# Patient Record
Sex: Male | Born: 1945
Health system: Southern US, Community
[De-identification: ages and names within clinical notes are randomized; demographics above are authoritative.]

## PROBLEM LIST (undated history)

## (undated) DIAGNOSIS — I2510 Atherosclerotic heart disease of native coronary artery without angina pectoris: Secondary | ICD-10-CM

## (undated) DIAGNOSIS — IMO0002 Reserved for concepts with insufficient information to code with codable children: Secondary | ICD-10-CM

## (undated) DIAGNOSIS — I10 Essential (primary) hypertension: Secondary | ICD-10-CM

## (undated) DIAGNOSIS — I25810 Atherosclerosis of coronary artery bypass graft(s) without angina pectoris: Secondary | ICD-10-CM

## (undated) DIAGNOSIS — M109 Gout, unspecified: Secondary | ICD-10-CM

## (undated) DIAGNOSIS — B029 Zoster without complications: Secondary | ICD-10-CM

## (undated) DIAGNOSIS — E785 Hyperlipidemia, unspecified: Secondary | ICD-10-CM

## (undated) DIAGNOSIS — E739 Lactose intolerance, unspecified: Secondary | ICD-10-CM

## (undated) DIAGNOSIS — F528 Other sexual dysfunction not due to a substance or known physiological condition: Secondary | ICD-10-CM

## (undated) DIAGNOSIS — I6529 Occlusion and stenosis of unspecified carotid artery: Secondary | ICD-10-CM

## (undated) HISTORY — DX: Hyperlipidemia, unspecified: E78.5

## (undated) HISTORY — DX: Essential (primary) hypertension: I10

## (undated) HISTORY — DX: Reserved for concepts with insufficient information to code with codable children: IMO0002

## (undated) HISTORY — DX: Zoster without complications: B02.9

## (undated) HISTORY — DX: Other sexual dysfunction not due to a substance or known physiological condition: F52.8

## (undated) HISTORY — PX: COLONOSCOPY: SHX174

## (undated) HISTORY — DX: Gout, unspecified: M10.9

## (undated) HISTORY — DX: Atherosclerotic heart disease of native coronary artery without angina pectoris: I25.10

## (undated) HISTORY — DX: Atherosclerosis of coronary artery bypass graft(s) without angina pectoris: I25.810

## (undated) HISTORY — DX: Occlusion and stenosis of unspecified carotid artery: I65.29

## (undated) HISTORY — DX: Lactose intolerance, unspecified: E73.9

---

## 1972-11-06 HISTORY — PX: PARTIAL GASTRECTOMY: SHX2172

## 1999-02-01 ENCOUNTER — Ambulatory Visit (HOSPITAL_COMMUNITY): Admission: RE | Admit: 1999-02-01 | Discharge: 1999-02-01 | Payer: Self-pay | Admitting: Internal Medicine

## 1999-02-01 ENCOUNTER — Encounter: Payer: Self-pay | Admitting: Internal Medicine

## 2007-11-07 HISTORY — PX: CORONARY ARTERY BYPASS GRAFT: SHX141

## 2008-01-16 ENCOUNTER — Ambulatory Visit: Payer: Self-pay | Admitting: Internal Medicine

## 2008-01-16 DIAGNOSIS — M109 Gout, unspecified: Secondary | ICD-10-CM

## 2008-01-16 DIAGNOSIS — I10 Essential (primary) hypertension: Secondary | ICD-10-CM | POA: Insufficient documentation

## 2008-01-16 DIAGNOSIS — E785 Hyperlipidemia, unspecified: Secondary | ICD-10-CM

## 2008-01-16 DIAGNOSIS — R079 Chest pain, unspecified: Secondary | ICD-10-CM | POA: Insufficient documentation

## 2008-01-16 HISTORY — DX: Hyperlipidemia, unspecified: E78.5

## 2008-01-16 HISTORY — DX: Essential (primary) hypertension: I10

## 2008-01-16 HISTORY — DX: Gout, unspecified: M10.9

## 2008-01-17 ENCOUNTER — Telehealth: Payer: Self-pay | Admitting: Internal Medicine

## 2008-01-17 LAB — CONVERTED CEMR LAB
ALT: 18 units/L (ref 0–53)
Albumin: 3.2 g/dL — ABNORMAL LOW (ref 3.5–5.2)
Alkaline Phosphatase: 65 units/L (ref 39–117)
BUN: 12 mg/dL (ref 6–23)
Basophils Absolute: 0.1 10*3/uL (ref 0.0–0.1)
Basophils Relative: 0.9 % (ref 0.0–1.0)
CO2: 28 meq/L (ref 19–32)
Calcium: 9.8 mg/dL (ref 8.4–10.5)
Direct LDL: 183.5 mg/dL
GFR calc Af Amer: 111 mL/min
Hemoglobin: 12.2 g/dL — ABNORMAL LOW (ref 13.0–17.0)
Lymphocytes Relative: 27.9 % (ref 12.0–46.0)
MCHC: 33.5 g/dL (ref 30.0–36.0)
Monocytes Absolute: 0.7 10*3/uL (ref 0.2–0.7)
Monocytes Relative: 8.3 % (ref 3.0–11.0)
Neutro Abs: 5 10*3/uL (ref 1.4–7.7)
PSA: 0.38 ng/mL (ref 0.10–4.00)
Platelets: 323 10*3/uL (ref 150–400)
Potassium: 5 meq/L (ref 3.5–5.1)
RDW: 13.3 % (ref 11.5–14.6)
TSH: 1.06 microintl units/mL (ref 0.35–5.50)
Uric Acid, Serum: 8.1 mg/dL — ABNORMAL HIGH (ref 2.4–7.0)

## 2008-01-24 ENCOUNTER — Ambulatory Visit: Payer: Self-pay

## 2008-01-24 ENCOUNTER — Ambulatory Visit: Payer: Self-pay | Admitting: Cardiovascular Disease

## 2008-01-25 ENCOUNTER — Ambulatory Visit (HOSPITAL_COMMUNITY): Admission: RE | Admit: 2008-01-25 | Discharge: 2008-01-25 | Payer: Self-pay | Admitting: Cardiovascular Disease

## 2008-01-28 ENCOUNTER — Encounter: Payer: Self-pay | Admitting: Cardiovascular Disease

## 2008-01-28 ENCOUNTER — Ambulatory Visit (HOSPITAL_COMMUNITY): Admission: RE | Admit: 2008-01-28 | Discharge: 2008-01-28 | Payer: Self-pay | Admitting: Cardiovascular Disease

## 2008-01-28 ENCOUNTER — Ambulatory Visit: Payer: Self-pay | Admitting: Cardiovascular Disease

## 2008-01-28 ENCOUNTER — Inpatient Hospital Stay (HOSPITAL_BASED_OUTPATIENT_CLINIC_OR_DEPARTMENT_OTHER): Admission: RE | Admit: 2008-01-28 | Discharge: 2008-01-28 | Payer: Self-pay | Admitting: Cardiovascular Disease

## 2008-01-28 ENCOUNTER — Encounter: Payer: Self-pay | Admitting: Internal Medicine

## 2008-01-28 ENCOUNTER — Ambulatory Visit: Payer: Self-pay | Admitting: Vascular Surgery

## 2008-01-30 ENCOUNTER — Ambulatory Visit: Payer: Self-pay | Admitting: Cardiothoracic Surgery

## 2008-02-10 ENCOUNTER — Ambulatory Visit: Payer: Self-pay | Admitting: Cardiothoracic Surgery

## 2008-02-10 ENCOUNTER — Inpatient Hospital Stay (HOSPITAL_COMMUNITY): Admission: RE | Admit: 2008-02-10 | Discharge: 2008-02-15 | Payer: Self-pay | Admitting: Cardiothoracic Surgery

## 2008-02-10 DIAGNOSIS — I25810 Atherosclerosis of coronary artery bypass graft(s) without angina pectoris: Secondary | ICD-10-CM

## 2008-02-10 HISTORY — DX: Atherosclerosis of coronary artery bypass graft(s) without angina pectoris: I25.810

## 2008-02-13 ENCOUNTER — Ambulatory Visit: Payer: Self-pay | Admitting: Cardiothoracic Surgery

## 2008-03-12 ENCOUNTER — Ambulatory Visit: Payer: Self-pay | Admitting: Cardiothoracic Surgery

## 2008-03-12 ENCOUNTER — Encounter (HOSPITAL_COMMUNITY): Admission: RE | Admit: 2008-03-12 | Discharge: 2008-06-10 | Payer: Self-pay | Admitting: Cardiovascular Disease

## 2008-03-12 ENCOUNTER — Encounter: Admission: RE | Admit: 2008-03-12 | Discharge: 2008-03-12 | Payer: Self-pay | Admitting: Cardiothoracic Surgery

## 2008-03-17 ENCOUNTER — Ambulatory Visit: Payer: Self-pay | Admitting: Cardiovascular Disease

## 2008-04-03 ENCOUNTER — Ambulatory Visit: Payer: Self-pay | Admitting: Internal Medicine

## 2008-04-03 DIAGNOSIS — I2510 Atherosclerotic heart disease of native coronary artery without angina pectoris: Secondary | ICD-10-CM | POA: Insufficient documentation

## 2008-04-03 HISTORY — DX: Atherosclerotic heart disease of native coronary artery without angina pectoris: I25.10

## 2008-04-10 ENCOUNTER — Telehealth: Payer: Self-pay | Admitting: Internal Medicine

## 2008-05-21 ENCOUNTER — Ambulatory Visit: Payer: Self-pay | Admitting: Cardiovascular Disease

## 2008-05-21 LAB — CONVERTED CEMR LAB
ALT: 26 units/L (ref 0–53)
AST: 23 units/L (ref 0–37)
Albumin: 3.2 g/dL — ABNORMAL LOW (ref 3.5–5.2)
Alkaline Phosphatase: 72 units/L (ref 39–117)
BUN: 31 mg/dL — ABNORMAL HIGH (ref 6–23)
CO2: 21 meq/L (ref 19–32)
Chloride: 110 meq/L (ref 96–112)
Cholesterol: 140 mg/dL (ref 0–200)
Creatinine, Ser: 1.5 mg/dL (ref 0.4–1.5)
GFR calc non Af Amer: 51 mL/min
Glucose, Bld: 116 mg/dL — ABNORMAL HIGH (ref 70–99)
Potassium: 5.5 meq/L — ABNORMAL HIGH (ref 3.5–5.1)
Total Protein: 7.1 g/dL (ref 6.0–8.3)
Triglycerides: 90 mg/dL (ref 0–149)

## 2008-05-27 ENCOUNTER — Ambulatory Visit: Payer: Self-pay | Admitting: Cardiovascular Disease

## 2008-05-27 LAB — CONVERTED CEMR LAB
CO2: 21 meq/L (ref 19–32)
Calcium: 9.4 mg/dL (ref 8.4–10.5)
Creatinine, Ser: 1.4 mg/dL (ref 0.4–1.5)
GFR calc Af Amer: 67 mL/min

## 2008-06-11 ENCOUNTER — Encounter (HOSPITAL_COMMUNITY): Admission: RE | Admit: 2008-06-11 | Discharge: 2008-07-30 | Payer: Self-pay | Admitting: Cardiovascular Disease

## 2008-06-18 ENCOUNTER — Ambulatory Visit: Payer: Self-pay | Admitting: Cardiovascular Disease

## 2008-06-18 LAB — CONVERTED CEMR LAB
BUN: 29 mg/dL — ABNORMAL HIGH (ref 6–23)
Calcium: 9.4 mg/dL (ref 8.4–10.5)
GFR calc Af Amer: 88 mL/min
GFR calc non Af Amer: 73 mL/min
Glucose, Bld: 127 mg/dL — ABNORMAL HIGH (ref 70–99)
Sodium: 140 meq/L (ref 135–145)

## 2008-07-20 ENCOUNTER — Ambulatory Visit: Payer: Self-pay | Admitting: Cardiovascular Disease

## 2008-07-20 LAB — CONVERTED CEMR LAB
BUN: 28 mg/dL — ABNORMAL HIGH (ref 6–23)
Creatinine, Ser: 1.3 mg/dL (ref 0.4–1.5)
GFR calc Af Amer: 73 mL/min
GFR calc non Af Amer: 60 mL/min
Potassium: 4.9 meq/L (ref 3.5–5.1)

## 2008-09-15 ENCOUNTER — Ambulatory Visit: Payer: Self-pay

## 2008-09-15 ENCOUNTER — Ambulatory Visit: Payer: Self-pay | Admitting: Cardiovascular Disease

## 2008-09-22 DIAGNOSIS — I6529 Occlusion and stenosis of unspecified carotid artery: Secondary | ICD-10-CM | POA: Insufficient documentation

## 2008-09-22 HISTORY — DX: Occlusion and stenosis of unspecified carotid artery: I65.29

## 2009-01-14 ENCOUNTER — Ambulatory Visit: Payer: Self-pay | Admitting: Internal Medicine

## 2009-01-14 DIAGNOSIS — F528 Other sexual dysfunction not due to a substance or known physiological condition: Secondary | ICD-10-CM | POA: Insufficient documentation

## 2009-01-14 HISTORY — DX: Other sexual dysfunction not due to a substance or known physiological condition: F52.8

## 2009-01-14 LAB — CONVERTED CEMR LAB
LDL Cholesterol: 119 mg/dL — ABNORMAL HIGH (ref 0–99)
PSA: 0.24 ng/mL (ref 0.10–4.00)
Uric Acid, Serum: 8 mg/dL — ABNORMAL HIGH (ref 4.0–7.8)
VLDL: 19 mg/dL (ref 0–40)

## 2009-01-22 ENCOUNTER — Telehealth: Payer: Self-pay | Admitting: Internal Medicine

## 2009-02-18 IMAGING — CR DG CHEST 1V PORT
1 series · 1 of 1 positions shown · non-contrast
Comparison: 02/10/2008.

CLINICAL DATA: Coronary artery disease.  Previous median
sternotomy.  Removal of endotracheal tube.

PORTABLE CHEST - 1 VIEW

[view not recorded]
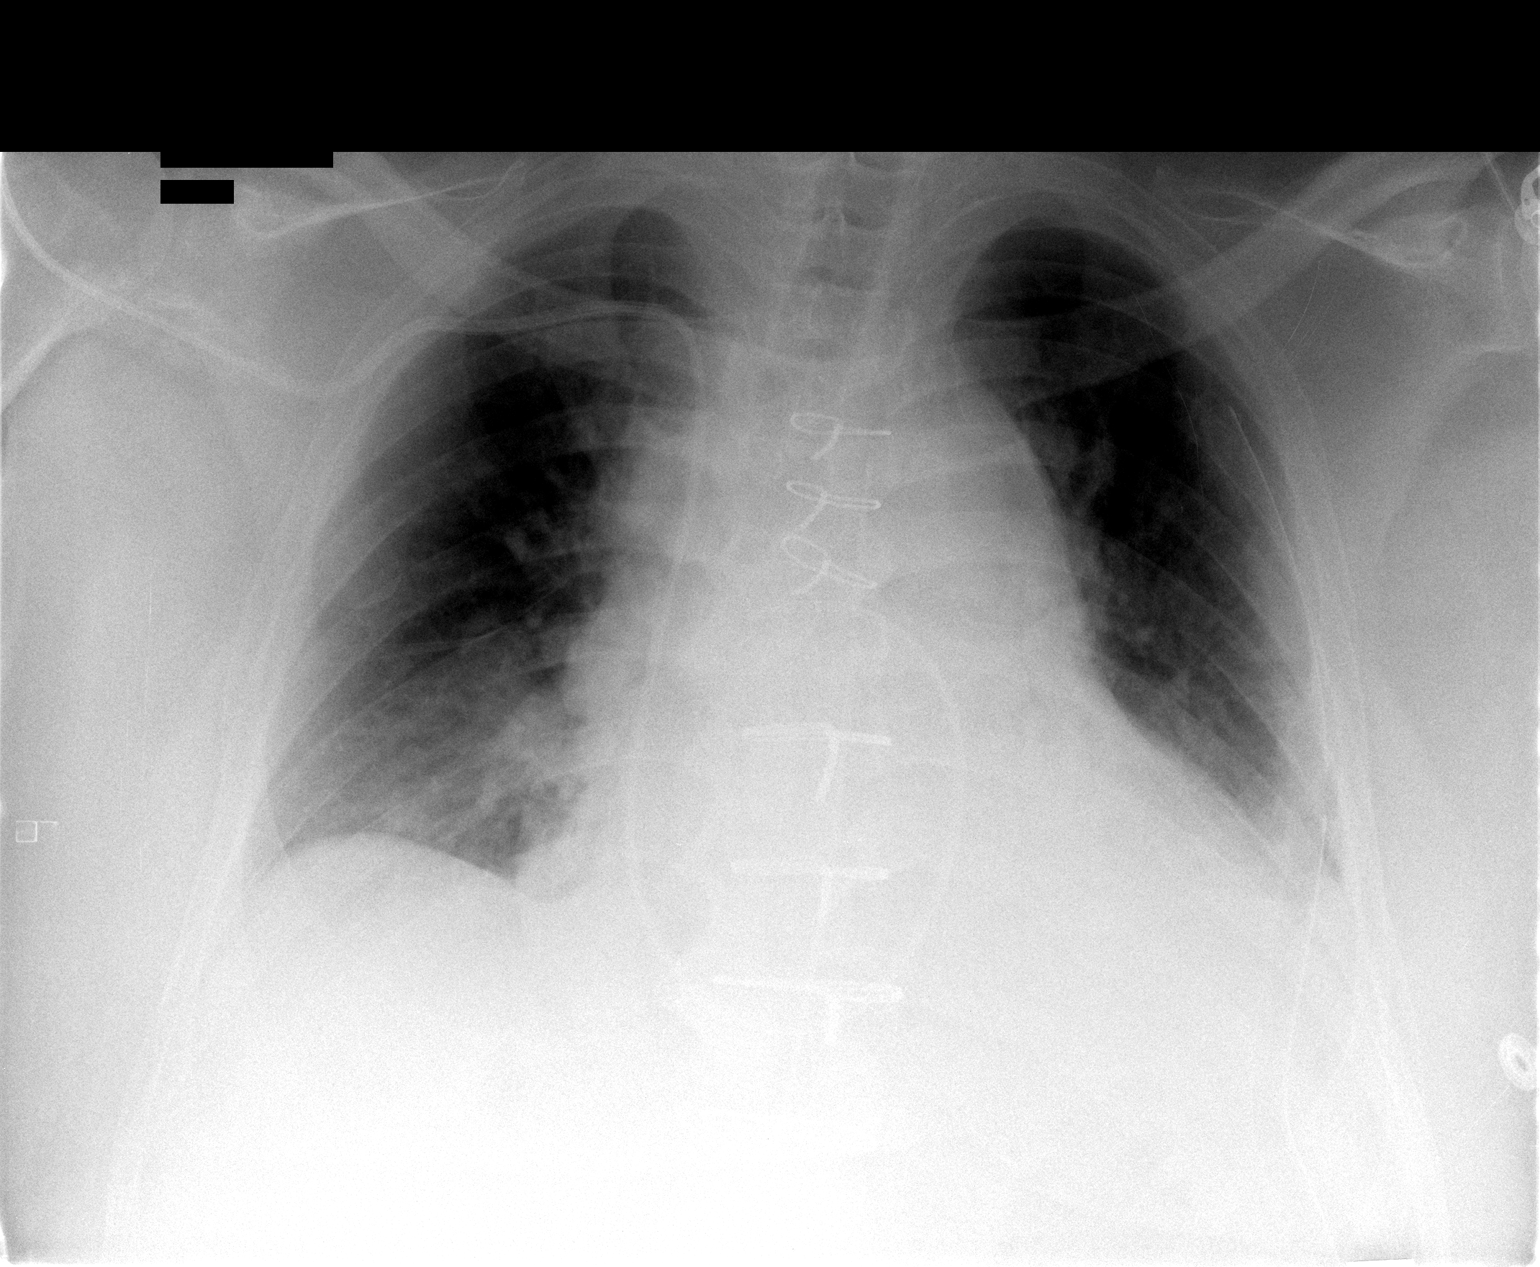

[1 of 1 positions shown; findings below may reference images not displayed]

FINDINGS: The endotracheal tube has been removed.  There has been a
previous median sternotomy.  There is stable cardiomegaly.  There
are mild stable bibasilar atelectatic changes.  The left sided
chest tube is in satisfactory position.  There is no pneumothorax.
IMPRESSION: Stable cardiomegaly and stable bibasilar atelectatic changes.  No
pneumothorax.

## 2009-02-20 IMAGING — CR DG CHEST 1V PORT
1 series · 1 of 1 positions shown · non-contrast
Comparison: 02/12/2008

CLINICAL DATA: Post CABG for coronary artery disease

PORTABLE CHEST - 1 VIEW

[AP]
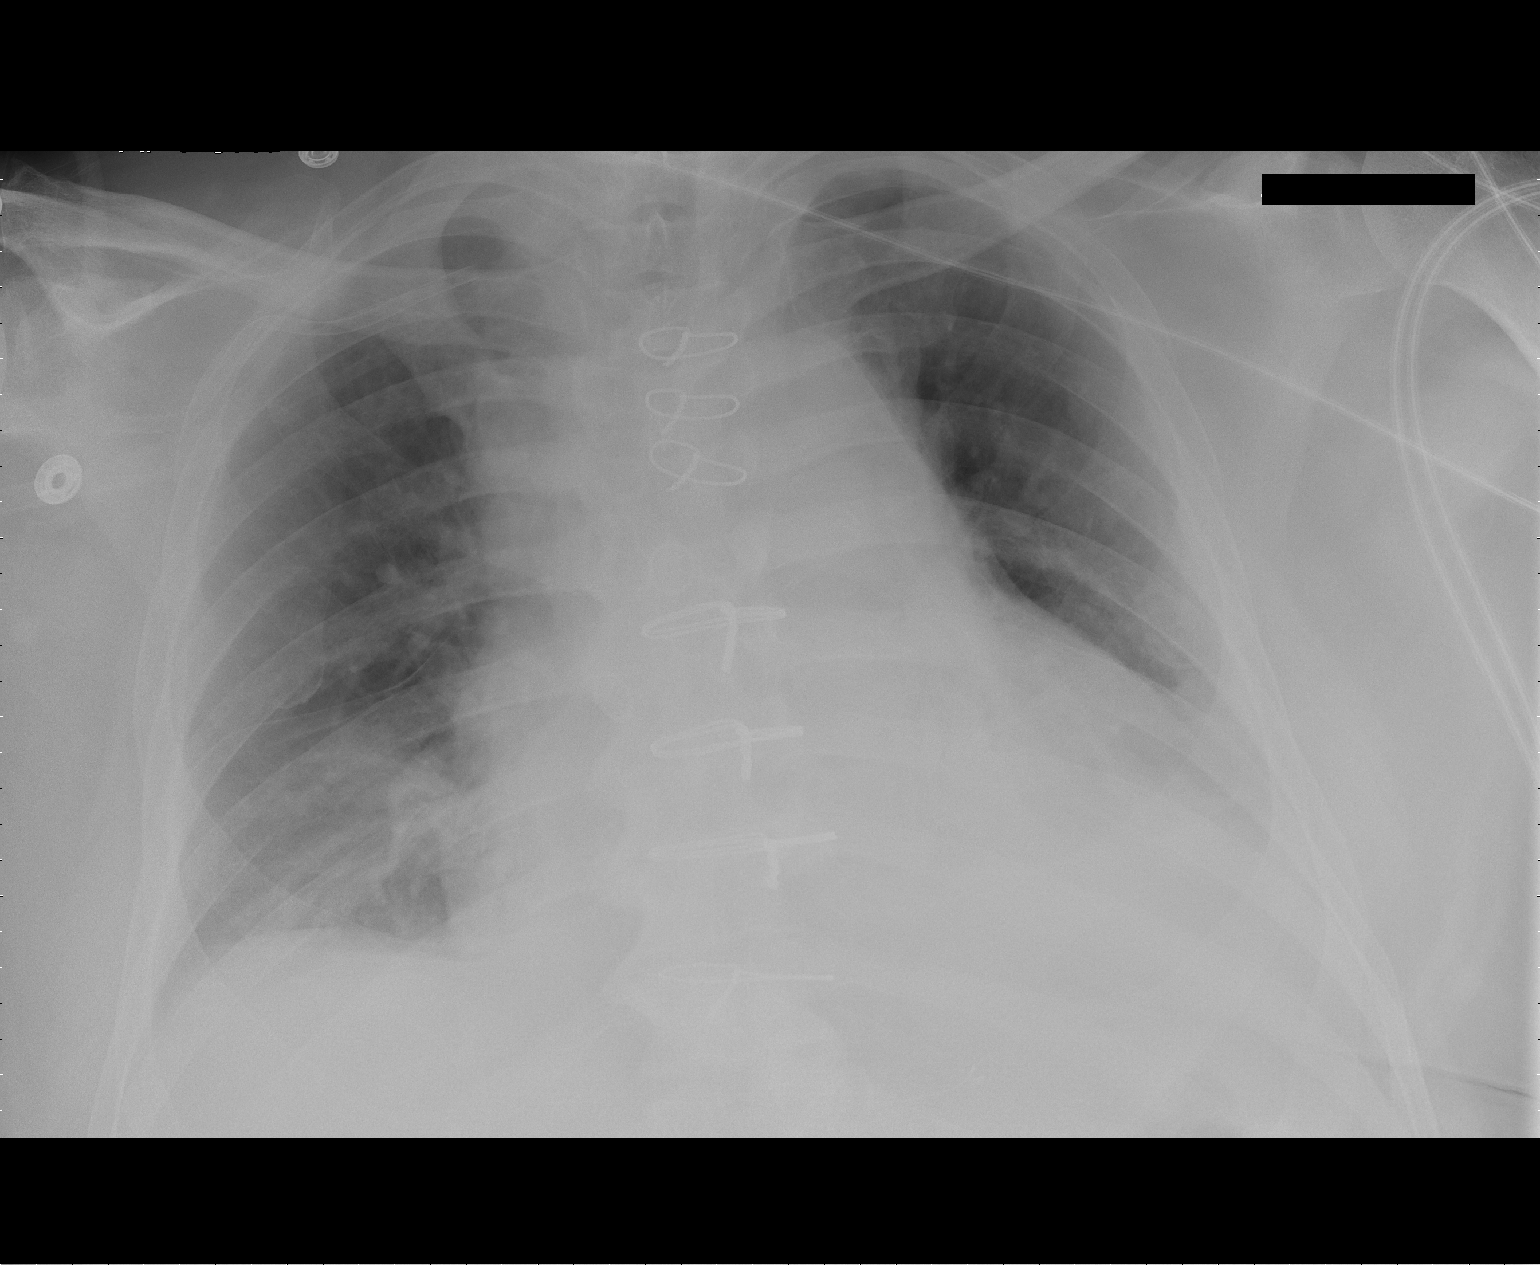

[1 of 1 positions shown; findings below may reference images not displayed]

FINDINGS: Heart remains enlarged without frank congestive heart
failure.  Bibasilar atelectasis about the same. No visible
pneumothorax.  A vascular sheath is positioned in the right
subclavian vein.  It is kinked at its entry point into the thorax.
IMPRESSION: 1.Postoperative atelectasis - no significant change.
2.  Right subclavian vascular sheath kinked near its entry point
into the  thorax

## 2009-04-15 ENCOUNTER — Ambulatory Visit: Payer: Self-pay | Admitting: Internal Medicine

## 2009-05-05 ENCOUNTER — Ambulatory Visit: Payer: Self-pay | Admitting: Cardiovascular Disease

## 2009-08-09 ENCOUNTER — Encounter: Payer: Self-pay | Admitting: Cardiovascular Disease

## 2009-11-06 DIAGNOSIS — B029 Zoster without complications: Secondary | ICD-10-CM

## 2009-11-06 HISTORY — DX: Zoster without complications: B02.9

## 2010-02-15 ENCOUNTER — Telehealth (INDEPENDENT_AMBULATORY_CARE_PROVIDER_SITE_OTHER): Payer: Self-pay | Admitting: *Deleted

## 2010-02-18 ENCOUNTER — Encounter (INDEPENDENT_AMBULATORY_CARE_PROVIDER_SITE_OTHER): Payer: Self-pay | Admitting: *Deleted

## 2010-02-18 ENCOUNTER — Telehealth (INDEPENDENT_AMBULATORY_CARE_PROVIDER_SITE_OTHER): Payer: Self-pay | Admitting: *Deleted

## 2010-04-15 ENCOUNTER — Ambulatory Visit: Payer: Self-pay | Admitting: Internal Medicine

## 2010-04-15 DIAGNOSIS — B029 Zoster without complications: Secondary | ICD-10-CM

## 2010-04-15 HISTORY — DX: Zoster without complications: B02.9

## 2010-07-04 ENCOUNTER — Telehealth: Payer: Self-pay | Admitting: Cardiovascular Disease

## 2010-08-05 ENCOUNTER — Ambulatory Visit: Payer: Self-pay | Admitting: Internal Medicine

## 2010-08-05 LAB — CONVERTED CEMR LAB
ALT: 18 units/L (ref 0–53)
AST: 21 units/L (ref 0–37)
Albumin: 3.3 g/dL — ABNORMAL LOW (ref 3.5–5.2)
Alkaline Phosphatase: 66 units/L (ref 39–117)
Basophils Relative: 0.8 % (ref 0.0–3.0)
CO2: 26 meq/L (ref 19–32)
Calcium: 9.2 mg/dL (ref 8.4–10.5)
Chloride: 107 meq/L (ref 96–112)
Eosinophils Absolute: 0.1 10*3/uL (ref 0.0–0.7)
Hemoglobin: 11.4 g/dL — ABNORMAL LOW (ref 13.0–17.0)
Lymphocytes Relative: 22.9 % (ref 12.0–46.0)
MCHC: 33.6 g/dL (ref 30.0–36.0)
MCV: 84.4 fL (ref 78.0–100.0)
Neutro Abs: 5.2 10*3/uL (ref 1.4–7.7)
Nitrite: NEGATIVE
RBC: 4.03 M/uL — ABNORMAL LOW (ref 4.22–5.81)
Sodium: 140 meq/L (ref 135–145)
Specific Gravity, Urine: 1.02
Total CHOL/HDL Ratio: 3
Total Protein: 7.4 g/dL (ref 6.0–8.3)
WBC Urine, dipstick: NEGATIVE

## 2010-08-22 ENCOUNTER — Ambulatory Visit: Payer: Self-pay | Admitting: Internal Medicine

## 2010-08-22 DIAGNOSIS — E739 Lactose intolerance, unspecified: Secondary | ICD-10-CM

## 2010-08-22 HISTORY — DX: Lactose intolerance, unspecified: E73.9

## 2010-08-22 LAB — CONVERTED CEMR LAB: Hgb A1c MFr Bld: 7.1 % — ABNORMAL HIGH (ref 4.6–6.5)

## 2010-10-06 ENCOUNTER — Ambulatory Visit: Payer: Self-pay | Admitting: Cardiovascular Disease

## 2010-11-08 ENCOUNTER — Encounter (INDEPENDENT_AMBULATORY_CARE_PROVIDER_SITE_OTHER): Payer: Self-pay | Admitting: *Deleted

## 2010-11-10 ENCOUNTER — Encounter: Payer: Self-pay | Admitting: Cardiovascular Disease

## 2010-11-17 ENCOUNTER — Encounter: Payer: Self-pay | Admitting: Cardiovascular Disease

## 2010-12-06 NOTE — Assessment & Plan Note (Signed)
Summary: cpx//ccm rsc bmp/njr/pt resc/ccm   Vital Signs:  Patient profile:   65 year old male Height:      69.5 inches Weight:      266 pounds BMI:     38.86 Temp:     98.9 degrees F oral BP sitting:   120 / 80  (right arm) Cuff size:   regular  Vitals Entered By: Duard Brady LPN (August 22, 2010 9:28 AM) CC: cpx - doing well Is Patient Diabetic? No   Primary Care Provider:  Gordy Savers  MD  CC:  cpx - doing well.  History of Present Illness: 65 year old patient who is seen today for a wellness exam.  Medical problems include coronary artery disease.  He is status post CABG.  He has hypertension, dyslipidemia, and a history of impaired glucose tolerance.  He has a history of exogenous obesity.  There is a history of gout, which has been stable.  Laboratory studies have revealed mild anemia.  No prior colonoscopies.  Preventive Screening-Counseling & Management  Caffeine-Diet-Exercise     Does Patient Exercise: no  Allergies (verified): No Known Drug Allergies  Past History:  Past Medical History: Hyperlipidemia Hypertension impaired glucose tolerance polyarticular gout exertional chest pain Coronary artery disease herpes zoster, June 2011 mild anemia  Past Surgical History: status post partial gastrectomy for peptic ulcer disease in the 70s Coronary artery bypass graft April 2009, with LIMA-LAD, SVG-RCA, and sequenced SVG - Ramus and OM1. colonoscopy-none   Family History: Reviewed history from 01/16/2008 and no changes required. father  has died recently at age 58 mother died at age 55, cardiac disease one brother in good health 5 sisters, history of gout; one sister with coronary artery disease  Social History: Reviewed history from 01/16/2008 and no changes required. Married discontinue smoking 6 months ago Regular exercise-no; walks infrequently  Does Patient Exercise:  no  Review of Systems  The patient denies anorexia, fever,  weight loss, weight gain, vision loss, decreased hearing, hoarseness, chest pain, syncope, dyspnea on exertion, peripheral edema, prolonged cough, headaches, hemoptysis, abdominal pain, melena, hematochezia, severe indigestion/heartburn, hematuria, incontinence, genital sores, muscle weakness, suspicious skin lesions, transient blindness, difficulty walking, depression, unusual weight change, abnormal bleeding, enlarged lymph nodes, angioedema, breast masses, and testicular masses.    Physical Exam  General:  overweight-appearing.  110/70overweight-appearing.   Head:  Normocephalic and atraumatic without obvious abnormalities. No apparent alopecia or balding. Eyes:  No corneal or conjunctival inflammation noted. EOMI. Perrla. Funduscopic exam benign, without hemorrhages, exudates or papilledema. Vision grossly normal. Ears:  External ear exam shows no significant lesions or deformities.  Otoscopic examination reveals clear canals, tympanic membranes are intact bilaterally without bulging, retraction, inflammation or discharge. Hearing is grossly normal bilaterally. Nose:  External nasal examination shows no deformity or inflammation. Nasal mucosa are pink and moist without lesions or exudates. Mouth:  Oral mucosa and oropharynx without lesions or exudates.  dentures in place Neck:  No deformities, masses, or tenderness noted. Chest Wall:  status post sternotomy Breasts:  No masses or gynecomastia noted Lungs:  Normal respiratory effort, chest expands symmetrically. Lungs are clear to auscultation, no crackles or wheezes. Heart:  Normal rate and regular rhythm. S1 and S2 normal without gallop, murmur, click, rub or other extra sounds. Abdomen:  Bowel sounds positive,abdomen soft and non-tender without masses, organomegaly or hernias noted. obese epigastric scar Rectal:  No external abnormalities noted. Normal sphincter tone. No rectal masses or tenderness.  hematest negative Genitalia:  Testes  bilaterally  descended without nodularity, tenderness or masses. No scrotal masses or lesions. No penis lesions or urethral discharge. Prostate:  suboptimal exam Msk:  No deformity or scoliosis noted of thoracic or lumbar spine.   Pulses:  R and L carotid,radial,femoral,dorsalis pedis and posterior tibial pulses are full and equal bilaterally Extremities:  No clubbing, cyanosis, edema, or deformity noted with normal full range of motion of all joints.   Neurologic:  No cranial nerve deficits noted. Station and gait are normal. Plantar reflexes are down-going bilaterally. DTRs are symmetrical throughout. Sensory, motor and coordinative functions appear intact. Skin:  Intact without suspicious lesions or rashes Cervical Nodes:  No lymphadenopathy noted Axillary Nodes:  No palpable lymphadenopathy Inguinal Nodes:  No significant adenopathy Psych:  Cognition and judgment appear intact. Alert and cooperative with normal attention span and concentration. No apparent delusions, illusions, hallucinations   Impression & Recommendations:  Problem # 1:  Preventive Health Care (ICD-V70.0)  Orders: Gastroenterology Referral (GI)  Problem # 2:  IMPAIRED GLUCOSE TOLERANCE (ICD-271.3)  by weight loss, exercise, all encouraged.  A hemoglobin A1c will be reviewed  Orders: Venipuncture (96295) TLB-A1C / Hgb A1C (Glycohemoglobin) (83036-A1C) Specimen Handling (28413)  Complete Medication List: 1)  Allopurinol 100 Mg Tabs (Allopurinol) .Marland Kitchen.. 1 once daily 2)  Bayer Aspirin 325 Mg Tabs (Aspirin) .Marland Kitchen.. 1 once daily 3)  Crestor 20 Mg Tabs (Rosuvastatin calcium) .... Take one tablet by mouth daily. 4)  Metoprolol Tartrate 50 Mg Tabs (Metoprolol tartrate) .Marland Kitchen.. 1 two times a day 5)  Nitroglycerin 0.4 Mg Subl (Nitroglycerin) .... One tablet under tongue every 5 minutes as needed for chest pain---may repeat times three 6)  Lisinopril 20 Mg Tabs (Lisinopril) .... Take one tablet by mouth daily 7)  Amlodipine  Besylate 10 Mg Tabs (Amlodipine besylate) .... Take one tablet by mouth daily 8)  Tramadol Hcl 50 Mg Tabs (Tramadol hcl) .... One every 6 hours as needed for pain 9)  Acyclovir 800 Mg Tabs (Acyclovir) .... One tablet 5 times per day  Other Orders: Flu Vaccine 10yrs + (24401) Admin 1st Vaccine (02725)  Patient Instructions: 1)  Please schedule a follow-up appointment in 3 months. 2)  Limit your Sodium (Salt). 3)  It is important that you exercise regularly at least 20 minutes 5 times a week. If you develop chest pain, have severe difficulty breathing, or feel very tired , stop exercising immediately and seek medical attention. 4)  You need to lose weight. Consider a lower calorie diet and regular exercise.  Prescriptions: TRAMADOL HCL 50 MG TABS (TRAMADOL HCL) one every 6 hours as needed for pain  #120 x 2   Entered and Authorized by:   Gordy Savers  MD   Signed by:   Gordy Savers  MD on 08/22/2010   Method used:   [facility] to        [facility]  [id number]       [address]       [place], [place]  [address]       Ph: [phone] or [phone]       Fax: [phone]   RxID:   [id number] AMLODIPINE BESYLATE 10 MG TABS (AMLODIPINE BESYLATE) Take one tablet by mouth daily  #90 Tablet x 1   Entered and Authorized by:   Gordy Savers  MD   Signed by:   Gordy Savers  MD on 08/22/2010   Method used:   [id number]       [address]       [place], [place]  [address]       Ph: [phone] or [phone]       Fax: [phone]   RxID:   [id number] LISINOPRIL 20 MG TABS (LISINOPRIL) Take one tablet by mouth daily  #90 x 6   Entered and Authorized by:   Gordy Savers  MD   Signed by:   Gordy Savers  MD on 08/22/2010   Method used:   [facility]        [id number]       [address]       [place], [place]  [address]       Ph: [phone] or [phone]       Fax: [phone]   RxID:   [id number] METOPROLOL TARTRATE 50 MG  TABS (METOPROLOL TARTRATE) 1 two times a day  #180 x 6   Entered and Authorized by:   Gordy Savers  MD   Signed by:   Gordy Savers  MD on 08/22/2010   Method used:   [facility]        [facility] [id number]       [address]       [place], [place]  [address]       Ph: [phone] or [phone]       Fax: [phone]   RxID:   [id number] CRESTOR 20 MG TABS (ROSUVASTATIN CALCIUM) Take one tablet by mouth daily.  #90 x 6   Entered and Authorized by:   Gordy Savers  MD   Signed by:   Gordy Savers  MD on 08/22/2010   Method used:   [facility]        [id number]       [address]       [place], [place]  [address]       Ph: [phone] or [phone]       Fax: [phone]   RxID:   [id number] ALLOPURINOL 100 MG  TABS (ALLOPURINOL) 1 once daily  #90 Tablet x 3   Entered and Authorized by:   Gordy Savers  MD   Signed by:   Gordy Savers  MD on 08/22/2010   Method used:   [facility]        [id number]       [address]       [place], [place]  [address]       Ph: [phone] or [phone]       Fax: [phone]   RxID:   [id number]    Orders Added: 1)  Flu Vaccine 66yrs + [94174] 2)  Admin 1st Vaccine [90471] 3)  Est. Patient 40-64 years [99396] 4)  Venipuncture [36415] 5)  TLB-A1C / Hgb A1C (Glycohemoglobin) [83036-A1C] 6)  Gastroenterology Referral [GI] 7)  Specimen Handling [99000]   Immunizations Administered:  Influenza Vaccine # 1:    Vaccine Type: Fluvax 3+    Site: left deltoid    Mfr: GlaxoSmithKline    Dose: 0.5 ml    Route: IM  Given by: Duard Brady LPN    Exp. Date: 05/06/2011    Lot #: [id number]    VIS  given: 05/31/10 version given August 22, 2010.    Physician counseled: yes  Flu Vaccine Consent Questions:    Do you have a history of severe allergic reactions to this vaccine? no    Any prior history of allergic reactions to egg and/or gelatin? no    Do you have a sensitivity to the preservative Thimersol? no    Do you have a past history of Guillan-Barre Syndrome? no    Do you currently have an acute febrile illness? no    Have you ever had a severe reaction to latex? no    Vaccine information given and explained to patient? yes   Immunizations Administered:  Influenza Vaccine # 1:    Vaccine Type: Fluvax 3+    Site: left deltoid    Mfr: GlaxoSmithKline    Dose: 0.5 ml    Route: IM    Given by: Duard Brady LPN    Exp. Date: 05/06/2011    Lot #: [id number]    VIS given: 05/31/10 version given August 22, 2010.    Physician counseled: yes  Appended Document: cpx//ccm rsc bmp/njr/pt resc/ccm    CBG Result 156   Allergies: No Known Drug Allergies   Complete Medication List: 1)  Allopurinol 100 Mg Tabs (Allopurinol) .Marland Kitchen.. 1 once daily 2)  Bayer Aspirin 325 Mg Tabs (Aspirin) .Marland Kitchen.. 1 once daily 3)  Crestor 20 Mg Tabs (Rosuvastatin calcium) .... Take one tablet by mouth daily. 4)  Metoprolol Tartrate 50 Mg Tabs (Metoprolol tartrate) .Marland Kitchen.. 1 two times a day 5)  Nitroglycerin 0.4 Mg Subl (Nitroglycerin) .... One tablet under tongue every 5 minutes as needed for chest pain---may repeat times three 6)  Lisinopril 20 Mg Tabs (Lisinopril) .... Take one tablet by mouth daily 7)  Amlodipine Besylate 10 Mg Tabs (Amlodipine besylate) .... Take one tablet by mouth daily 8)  Tramadol Hcl 50 Mg Tabs (Tramadol hcl) .... One every 6 hours as needed for pain 9)  Acyclovir 800 Mg Tabs (Acyclovir) .... One tablet 5 times per day  Other Orders: Capillary Blood Glucose/CBG (29562)   Orders Added: 1)  Capillary Blood Glucose/CBG [13086]

## 2010-12-06 NOTE — Miscellaneous (Signed)
Summary: update med  Clinical Lists Changes  Medications: Changed medication from LIPITOR 40 MG  TABS (ATORVASTATIN CALCIUM) 1 once daily to SIMVASTATIN 80 MG TABS (SIMVASTATIN) Take one tablet by mouth daily at bedtime

## 2010-12-06 NOTE — Progress Notes (Signed)
Summary: refill meds  Phone Note Refill Request Call back at Home Phone [phone] Message from:  Patient on February 15, 2010 2:34 PM  Refills Requested: Medication #1:  METOPROLOL TARTRATE 50 MG  TABS 1 two times a day simvastatin 80 mg.  [facility] [address] [phone]   Method Requested: Fax to Local Pharmacy Initial call taken by: Lorne Skeens,  February 15, 2010 2:35 PM  Follow-up for Phone Call        Rx faxed to pharmacy Follow-up by: Vikki Ports,  February 15, 2010 2:43 PM    Prescriptions: METOPROLOL TARTRATE 50 MG  TABS (METOPROLOL TARTRATE) 1 two times a day  #60 x 6   Entered by:   Vikki Ports   Authorized by:   Norva Karvonen, MD   Signed by:   Vikki Ports on 02/15/2010   Method used:   [facility] to ...       [id number]       [address]       [place], [place]  [address]       Ph: [phone] or [phone]       Fax: [phone]   RxID:   [id number]

## 2010-12-06 NOTE — Progress Notes (Signed)
Summary: refill  Phone Note Refill Request   Refills Requested: Medication #1:  simvastatin 80mg  1 tab daily   [facility] [address]   Method Requested: Telephone to Pharmacy Initial call taken by: Migdalia Dk,  February 18, 2010 1:21 PM  Follow-up for Phone Call        Rx faxed to pharmacy Follow-up by: Vikki Ports,  February 18, 2010 2:36 PM

## 2010-12-06 NOTE — Progress Notes (Signed)
Summary: light chest pain  Phone Note Call from Patient   Caller: Patient Reason for Call: Talk to Nurse Summary of Call: pt having light chest pain since last week-pls advise [phone] Initial call taken by: Glynda Jaeger,  July 04, 2010 12:17 PM  Follow-up for Phone Call        I spoke with the pt's wife and the pt is having "light" CP. The pt has had symptoms off and on for one week.  I told the pt's wife that I would like to speak with the pt about his symptoms to determine if he would require earlier follow-up before Thursday.  She will have the pt call our office.  Julieta Gutting, RN, BSN  July 04, 2010 3:36 PM   last LDL was 119 - I would change to crestor rather than reduce simvastatin dose....crestor 20 mg daily with f/u lipids and lft's 3 months--per Dr Excell Seltzer  Additional Follow-up for Phone Call Additional follow up Details #1::        Pt returning call [phone] Judie Grieve  July 05, 2010 11:32 AM Additional Follow-up by: Judie Grieve,  July 05, 2010 11:32 AM    Additional Follow-up for Phone Call Additional follow up Details #2::    I called and the pt is currently not at home.  The pt's wife will call him at work and have him call the office. Julieta Gutting, RN, BSN  July 05, 2010 3:37 PM  I spoke with the pt and he c/o "light" CP last week.  The pt said he has been very upset and emotional since his friend passed away and feels this could have caused his symptoms.  The pt said he could barely tell that he was having pain and noticed it when he was crying.  The pt states he feels fine now and he has been pain free for the last 4-5 days.  I asked the pt to continue to monitor his symptoms and if he had any recurrence to call our office ASAP.  Pt agreed with plan.   I also spoke with the pt's wife and made her aware of my conversation with the pt.  I asked her to have the pt stop simvastatin and start Crestor 20mg  daily.  The pt will have labs rechecked on  10/06/10.  Follow-up by: Julieta Gutting, RN, BSN,  July 05, 2010 4:24 PM  New/Updated Medications: CRESTOR 20 MG TABS (ROSUVASTATIN CALCIUM) Take one tablet by mouth daily. Prescriptions: CRESTOR 20 MG TABS (ROSUVASTATIN CALCIUM) Take one tablet by mouth daily.  #30 x 6   Entered by:   Julieta Gutting, RN, BSN   Authorized by:   Norva Karvonen, MD   Signed by:   Julieta Gutting, RN, BSN on 07/05/2010   Method used:   [facility]        [facility]  [id number]       [address]       [place], [place]  [address]       Ph: [phone] or [phone]       Fax: [phone]   RxID:   [id number]

## 2010-12-06 NOTE — Assessment & Plan Note (Signed)
Summary: rash on shoulders//ccm   Vital Signs:  Patient profile:   65 year old male Weight:      268 pounds Temp:     98.6 degrees F oral BP sitting:   132 / 80  (right arm) Cuff size:   large  Vitals Entered By: Duard Brady LPN (April 15, 2010 8:06 AM) CC: rash to shoulders x 5 days Is Patient Diabetic? No   Primary Care Provider:  Gordy Savers  MD  CC:  rash to shoulders x 5 days.  History of Present Illness: 65 year old patient has a history of coronary artery disease.  He presents with a chief complaint of rash to his left shoulder region.  This has been present for about 5 days.  He has hypertension and dyslipidemia, which has been stable.  He has a history of gout.  He doesn't run some occasional bilateral knee pain without inflammatory changes.  It is self-limited  Preventive Screening-Counseling & Management  Alcohol-Tobacco     Smoking Status: quit  Allergies (verified): No Known Drug Allergies  Past History:  Past Medical History: Hyperlipidemia Hypertension impaired glucose tolerance polyarticular gout exertional chest pain Coronary artery disease herpes zoster, June 2011  Review of Systems       The patient complains of suspicious skin lesions.  The patient denies anorexia, fever, weight loss, weight gain, vision loss, decreased hearing, hoarseness, chest pain, syncope, dyspnea on exertion, peripheral edema, prolonged cough, headaches, hemoptysis, abdominal pain, melena, hematochezia, severe indigestion/heartburn, hematuria, incontinence, genital sores, muscle weakness, transient blindness, difficulty walking, depression, unusual weight change, abnormal bleeding, enlarged lymph nodes, angioedema, breast masses, and testicular masses.    Physical Exam  General:  overweight-appearing.  120/76 Head:  Normocephalic and atraumatic without obvious abnormalities. No apparent alopecia or balding. Neck:  No deformities, masses, or tenderness  noted. Lungs:  Normal respiratory effort, chest expands symmetrically. Lungs are clear to auscultation, no crackles or wheezes. Heart:  Normal rate and regular rhythm. S1 and S2 normal without gallop, murmur, click, rub or other extra sounds. Skin:  herpetic rash over his left shoulder and chest wall area.  It extends to his posterior back   Impression & Recommendations:  Problem # 1:  CORONARY ARTERY DISEASE (ICD-414.00)  His updated medication list for this problem includes:    Bayer Aspirin 325 Mg Tabs (Aspirin) .Marland Kitchen... 1 once daily    Metoprolol Tartrate 50 Mg Tabs (Metoprolol tartrate) .Marland Kitchen... 1 two times a day    Nitroglycerin 0.4 Mg Subl (Nitroglycerin) ..... One tablet under tongue every 5 minutes as needed for chest pain---may repeat times three    Lisinopril 20 Mg Tabs (Lisinopril) .Marland Kitchen... Take one tablet by mouth daily    Amlodipine Besylate 10 Mg Tabs (Amlodipine besylate) .Marland Kitchen... Take one tablet by mouth daily  Problem # 2:  GOUT (ICD-274.9)  His updated medication list for this problem includes:    Allopurinol 100 Mg Tabs (Allopurinol) .Marland Kitchen... 1 once daily  Orders: Prescription Created Electronically [phone])  Problem # 3:  HYPERTENSION (ICD-401.9)  His updated medication list for this problem includes:    Metoprolol Tartrate 50 Mg Tabs (Metoprolol tartrate) .Marland Kitchen... 1 two times a day    Lisinopril 20 Mg Tabs (Lisinopril) .Marland Kitchen... Take one tablet by mouth daily    Amlodipine Besylate 10 Mg Tabs (Amlodipine besylate) .Marland Kitchen... Take one tablet by mouth daily  Orders: Prescription Created Electronically [phone])  Problem # 4:  HYPERLIPIDEMIA (ICD-272.4)  His updated medication list for this problem includes:  Simvastatin 80 Mg Tabs (Simvastatin) .Marland Kitchen... Take one tablet by mouth daily at bedtime  Orders: Prescription Created Electronically [phone])  Complete Medication List: 1)  Allopurinol 100 Mg Tabs (Allopurinol) .Marland Kitchen.. 1 once daily 2)  Bayer Aspirin 325 Mg Tabs (Aspirin) .Marland Kitchen.. 1 once  daily 3)  Simvastatin 80 Mg Tabs (Simvastatin) .... Take one tablet by mouth daily at bedtime 4)  Metoprolol Tartrate 50 Mg Tabs (Metoprolol tartrate) .Marland Kitchen.. 1 two times a day 5)  Nitroglycerin 0.4 Mg Subl (Nitroglycerin) .... One tablet under tongue every 5 minutes as needed for chest pain---may repeat times three 6)  Lisinopril 20 Mg Tabs (Lisinopril) .... Take one tablet by mouth daily 7)  Amlodipine Besylate 10 Mg Tabs (Amlodipine besylate) .... Take one tablet by mouth daily 8)  Tramadol Hcl 50 Mg Tabs (Tramadol hcl) .... One every 6 hours as needed for pain 9)  Acyclovir 800 Mg Tabs (Acyclovir) .... One tablet 5 times per day  Patient Instructions: 1)  Please schedule a follow-up appointment in 4 months. 2)  Limit your Sodium (Salt) to less than 2 grams a day(slightly less than 1/2 a teaspoon) to prevent fluid retention, swelling, or worsening of symptoms. 3)  It is important that you exercise regularly at least 20 minutes 5 times a week. If you develop chest pain, have severe difficulty breathing, or feel very tired , stop exercising immediately and seek medical attention. 4)  You need to lose weight. Consider a lower calorie diet and regular exercise.  Prescriptions: ACYCLOVIR 800 MG TABS (ACYCLOVIR) one tablet 5 times per day  #35 x 0   Entered and Authorized by:   Gordy Savers  MD   Signed by:   Gordy Savers  MD on 04/15/2010   Method used:   Print then Give to Patient   RxID:   [id number] TRAMADOL HCL 50 MG TABS (TRAMADOL HCL) one every 6 hours as needed for pain  #120 x 2   Entered and Authorized by:   Gordy Savers  MD   Signed by:   Gordy Savers  MD on 04/15/2010   Method used:   Electronically to        [id number]       [address]       [place], [place]  [address]       Ph: [phone] or [phone]       Fax: [phone]   RxID:   [id number] AMLODIPINE BESYLATE 10 MG TABS (AMLODIPINE  BESYLATE) Take one tablet by mouth daily  #90 Tablet x 1   Entered and Authorized by:   Gordy Savers  MD   Signed by:   Gordy Savers  MD on 04/15/2010   Method used:   Electronically to        [id number]       [address]       [place], [place]  [address]       Ph: [phone] or [phone]       Fax: [phone]   RxID:   [id number] LISINOPRIL 20 MG TABS (LISINOPRIL) Take one tablet by mouth daily  #90 x 6   Entered and Authorized by:   Gordy Savers  MD   Signed by:   Gordy Savers  MD on 04/15/2010   Method used:   [facility]ronically to  [facility] [id number]       [address]       [place], [place]  [address]       Ph: [phone] or [phone]       Fax: [phone]   RxID:   [id number] METOPROLOL TARTRATE 50 MG  TABS (METOPROLOL TARTRATE) 1 two times a day  #180 x 6   Entered and Authorized by:   Gordy Savers  MD   Signed by:   Gordy Savers  MD on 04/15/2010   Method used:   Electronically to        [id number]       [address]       [place], [place]  [address]       Ph: [phone] or [phone]       Fax: [phone]   RxID:   [id number] SIMVASTATIN 80 MG TABS (SIMVASTATIN) Take one tablet by mouth daily at bedtime  #90 x 3   Entered and Authorized by:   Gordy Savers  MD   Signed by:   Gordy Savers  MD on 04/15/2010   Method used:   Electronically to        [facility] [id number]       [address]       [place], [place]  [address]       Ph: [phone] or [phone]       Fax: [phone]   RxID:   [id number] ALLOPURINOL 100 MG  TABS (ALLOPURINOL) 1 once daily  #90 Tablet x 3   Entered and Authorized by:   Gordy Savers  MD   Signed by:   Gordy Savers  MD on 04/15/2010   Method used:   [facility]ronically to        [facility] [id number]       [address]       [place], [place]  [address]       Ph: [phone] or [phone]       Fax: [phone]   RxID:   [id number]

## 2010-12-08 NOTE — Medication Information (Signed)
Summary: Crestor  Crestor   Imported By: Marylou Mccoy 11/28/2010 17:25:38  _____________________________________________________________________  External Attachment:    Type:   Image     Comment:   External Document

## 2010-12-08 NOTE — Letter (Signed)
Summary: Referral - not able to see patient  [facility]  [address] [place], [place] [address]   Phone: [phone]  Fax: [phone]    November 08, 2010   Gordy Savers, M.D. [address] [place], [place] [address]   Re:   [name] DOB:  05/13/1946 MRN:   [id number]    Dear Dr. Amador Cunas:  Thank you for your kind referral of the above patient.  We have attempted to schedule the recommended procedure Screening Colonoscopy but have not been able to schedule because:   X  The patient was not available by phone and/or has not returned our calls.  ___ The patient declined to schedule the procedure at this time.  We appreciate the referral and hope that we will have the opportunity to treat this patient in the future.    Sincerely,    [facility] [phone]

## 2010-12-22 NOTE — Letter (Signed)
Summary: Medical Necessity Prior Authorization Form   Medical Necessity Prior Authorization Form   Imported By: Roderic Ovens 12/15/2010 09:38:09  _____________________________________________________________________  External Attachment:    Type:   Image     Comment:   External Document

## 2011-01-28 ENCOUNTER — Other Ambulatory Visit: Payer: Self-pay | Admitting: Internal Medicine

## 2011-03-21 NOTE — Discharge Summary (Signed)
NAMEDUJUAN, [name]              ACCOUNT NO.:  [id number]   MEDICAL RECORD NO.:  [id number]           PATIENT TYPE:   LOCATION:                                 FACILITY:   PHYSICIAN:  Sheliah Plane, MD    DATE OF BIRTH:  Jul 01, 1946   DATE OF ADMISSION:  DATE OF DISCHARGE:                               DISCHARGE SUMMARY   FINAL DIAGNOSIS:  Severe three-vessel coronary artery disease.   IN-HOSPITAL DIAGNOSES:  1. Postoperative hypotension.  2. Acute blood loss anemia postoperatively.  3. Volume overload postoperatively.   SECONDARY DIAGNOSES:  1. History of gouty arthritis.  2. History of bleeding ulcer treated with gastric resection in 1970.  3. Hypertension.  4. Hyperlipidemia.   IN-HOSPITAL OPERATIONS/PROCEDURES:  Coronary artery bypass grafting x4  using a left internal mammary artery to the left anterior descending,  saphenous vein graft to the obtuse marginal, saphenous vein graft to  ramus intermedius, and sequential to obtuse marginal.  Vein harvesting  to the right leg done.   HISTORY AND PHYSICAL/HOSPITAL COURSE:  The patient is a 65 year old male  who presents with approximately 1-year history of substernal chest  discomfort and nonradiating pain with mild exertional shortness of  breath.  He has history of hypertension, hyperlipidemia, and has a  history of tobacco use.  He underwent cardiac catheterization by Dr.  Excell Seltzer and noted to have severe 3-vessel coronary artery disease.  The  patient was seen and evaluated by Dr. Tyrone Sage.  Dr. Tyrone Sage discussed  with the patient undergoing coronary bypass grafting.  He discussed the  risks and benefits with the patient.  The patient voiced understanding  and agreed to proceed.  Surgery was scheduled for February 10, 2008.  For  details of the patient's past medical history and physical exam, please  see dictated H&P.   The patient was taken to the operating room on February 10, 2008, where he  underwent coronary bypass  grafting x4 using a left internal mammary  artery to the left anterior descending, saphenous vein graft to the  obtuse marginal, saphenous vein graft sequential to ramus intermedius  and obtuse marginal.  Right endovein harvesting was done.  The patient  tolerated this procedure well and was transferred to the Intensive Care  Unit in stable condition.  Postoperatively, the patient was noted to be  hemodynamically stable.  He was extubated on the evening of surgery.  Post extubation, the patient was noted to be alert and oriented x4,  neuro intact.  The patient was placed on nasal cannula postoperatively.  Followup chest x-ray done on postop day #1 was stable.  The patient had  minimal drainage from chest tubes and these were left in postop day #1.  Drainage slowed down and decreased, and chest tubes were able to be  discontinued postop day #2 with chest x-ray remaining stable.  The  patient was using his incentive spirometer and able to be weaned off  oxygen with sats greater than 90% on room air.  Postoperatively, the  patient was hypotensive.  He required dopamine and neo drip.  The  patient remained  on these on postop day #1 and postop day #2.  On postop  day #1, hemoglobin/hematocrit had dropped to 8.2 and 24% on the evening  postop day #1.  The patient received 1 unit of packed red blood cells.  Hemoglobin and hematocrit did improve the following day to 9.3 and 27%.  The patient was able to be weaned off the neo and dopamine after  transfusion on postop day #3.  His blood pressure and heart rate  remained stable.  Hemoglobin and hematocrit continued to be monitored  and remained stable during his postoperative course.  The patient was  able to be transferred out of 2300 to 2000 on postop day #3.  The  patient was out of bed ambulating well without difficulty.  He was  tolerating diet well and no nausea or vomiting noted.  He did have mild  volume overload requiring diuretics.  The  patient was back near baseline  weight prior to discharge home.  All vital signs remained stable.  Incision was clean, dry, and intact, and healing well.  The patient  remained in normal sinus rhythm.   The patient is tentatively ready for discharge home in the next 1-2 days  pending he remained stable.   FOLLOWUP APPOINTMENTS:  Followup appointment will be arranged with Dr.  Tyrone Sage in 3 weeks.  Our office will contact the patient with this  information.  The patient will need to obtain AP and lateral chest x-ray  30 minutes prior to this appointment.  He will need to follow up with  Dr. Excell Seltzer in 2 weeks.  He will need to contact Dr. Earmon Phoenix office to  make these arrangements.   ACTIVITY:  The patient is instructed no driving until he is to do so, no  heavy lifting over 10 pounds.  He is told to ambulate 3-4 times per day,  progress as tolerated, and to continue his breathing exercises.   INCISIONAL CARE:  The patient was told to shower, washing his incisions  using soap and water.  He is to contact the office if he develops any  drainage or opening from any of his incision sites.   DIET:  The patient is to begin on diet, to be low fat, low salt.   DISCHARGE MEDICATIONS:  1. Colchicine 0.6 mg q.i.d. p.r.n.  2. Allopurinol 100 mg daily.  3. Aspirin 325 mg daily.  4. Lipitor 40 mg daily.  5. Lisinopril 5 mg daily.  6. Lopressor 25 mg b.i.d.  7. Lasix 40 mg daily x5 days.  8. Potassium chloride 20 mEq daily x5 days.  9. Oxycodone 5 mg 1-2tabs q.4-6 hours p.r.n.      Theda Belfast, [place]      Sheliah Plane, MD  Electronically Signed    KMD/MEDQ  D:  02/14/2008  T:  02/14/2008  Job:  [id number]   cc:   Sheliah Plane, MD  [name], MD

## 2011-03-21 NOTE — Assessment & Plan Note (Signed)
[facility]                            CARDIOLOGY OFFICE NOTE   NAME:Simonis, KAEO JACOME                     MRN:          [id number]  DATE:01/24/2008                            DOB:          1946-10-09    PRIMARY CARE PHYSICIAN:  Gordy Savers, MD   REASON FOR CONSULTATION:  Chest pain with abnormal Myoview.   HISTORY OF PRESENT ILLNESS:  [name] is a 65 year old gentleman  who was referred by Dr. Amador Cunas for an exercise Myoview stress  study.  He was referred because of symptoms typical for chronic stable  angina.  He was able to exercise for 4 minutes and 30 seconds, and with  that level of exercise, he had significant chest pain with ST and T-wave  segment changes.  His myocardial perfusion imaging demonstrated a  moderately severe area of ischemia in the inferolateral wall.  Because  of his symptoms and abnormal stress test, he was seen in the office  immediately after the stress test.   Mr. Hearns has experienced symptoms of substernal chest pressure over  the course of one year.  He describes no increase in symptoms over that  time period.  He regularly has chest pain with walking as well as with  physical work.  He holds down two jobs.  He works at [employer] in a  relatively sedentary job and has no problems with that.  However, he  stocks shelves in another store and with that level of activity.  He  regularly experiences chest pressure that goes away with rest.   He has no associated symptoms.  Specifically, he denies any dyspnea,  nausea, vomiting, diaphoresis, lightheadedness or syncope.  He has not  had a previous cardiac evaluation.  He denies any rest symptoms.  He has  no other complaints today.   PAST MEDICAL HISTORY:  1. Remote peptic ulcer disease with a bleeding ulcer treated with a      partial gastrectomy.  2. Dyslipidemia.  3. Hypertension.  4. Tobacco abuse.  5. Gout.   SOCIAL HISTORY:  The patient is  married.  He lives locally and works at  [employer].  He is a former smoker but quit 7 months ago.  He drinks one  six-pack of beer per day.  He does not do much regular exercise.   FAMILY HISTORY:  The patient's father died recently at age 45 of old  age.  His mother died at age 15 of cardiac disease.  He reports she was  a heavy smoker.  He has siblings without any history of coronary disease  per his report.   REVIEW OF SYSTEMS:  A complete 12 point review of systems was performed  and is negative except as detailed above.   PHYSICAL EXAMINATION:  GENERAL:  Physical exam the patient is alert and  oriented.  He is an obese male in no acute distress.  VITAL SIGNS:  Blood pressure was 168/88, heart rate 70, respiratory rate  16.  HEENT:  Normal.  NECK:  Normal carotid upstrokes without bruits.  Jugular venous pressure  is normal.  There is no thyromegaly or thyroid nodules.  LUNGS:  Clear bilaterally.  HEART:  The apex is discrete nondisplaced.  The heart regular rate and  rhythm without murmurs or gallops.  ABDOMEN:  Soft, obese, nontender no organomegaly.  No abdominal bruit.  BACK:  No CVA tenderness.  EXTREMITIES:  Femoral pulses are 2+, pedal pulses are 2+.  There is no  clubbing, cyanosis or edema.  SKIN:  Warm and dry without rash.  LYMPHATICS:  No adenopathy.  NEUROLOGIC:  Cranial nerves II-XII are intact.  Strength 5/5 and equal  in the arms and legs.   STUDIES:  EKG at baseline shows normal sinus rhythm is within normal  limits.   LABORATORY DATA:  The recent lab work from March 12 showed hemoglobin of  12.2, hematocrit 36.5, platelet count 323,000, BUN was 12, creatinine  0.9, potassium 5.0.  Liver function tests were normal.  Cholesterol was  246, LDL cholesterol was 184.  I am unable to find an HDL or  triglyceride level.   Stress Myoview study is outlined above in the HPI.   IMPRESSION:  1. Exertional angina with abnormal Myoview scan.  2. Hypertension.  3.  Hyperlipidemia.  4. Obesity.   Mr. Olander has classic symptoms of angina with significant ischemia on  his Myoview scan.  He has ischemic symptoms and EKG changes at low-level  exercise.  He clearly is a relatively high risk and warrants cardiac  catheterization.  I reviewed the risks and indications of cardiac cath  in detail.  He will be set up for early next week in the [facility] cath lab.  In the meantime, I have asked him to continue on his aspirin and beta  blocker.  I have taken the liberty of starting him on Lipitor 40 mg  daily.  He has a prescription from Dr. Amador Cunas for nitroglycerin to  be used as needed.  If he has any change in his symptoms, he was given  instructions regarding EMS.  I think it is likely that he will remain  stable as his symptoms are unchanged over quite a long time.  Hopefully, he will have treatable disease as his ischemic defect, while  large, appears to be in a single-vessel territory.     [name], MD  Electronically Signed    MDC/MedQ  DD: 01/24/2008  DT: 01/24/2008  Job #: [id number]   cc:   Gordy Savers, MD

## 2011-03-21 NOTE — Assessment & Plan Note (Signed)
HEALTHCARE                            CARDIOLOGY OFFICE NOTE   NAME:Veith, ATTHEW COUTANT                     MRN:          [id number]  DATE:09/15/2008                            DOB:          [birth year]    REASON FOR FOLLOWUP:  Coronary artery disease status post coronary  artery bypass surgery.   HISTORY OF PRESENT ILLNESS:  [name] is a 65 year old gentleman  who presented with class III exertional angina, last year and was found  to have severe multivessel CAD.  He underwent coronary artery bypass  surgery by Dr. Tyrone Sage after his cardiac catheterization showed  extensive CAD.  He has had an excellent clinical response to surgery and  now has some class I symptoms.  He denies any chest pain or dyspnea with  activity.  He really feels well.  He has not been engaged in any regular  exercise program.  He works in the [employer] at [employer] and does some  lifting and carrying at work, but otherwise is not very active.  He  specifically denies chest pain, dyspnea, orthopnea, PND, edema,  palpitations, or lightheadedness.   CURRENT MEDICATIONS:  Include  1. Lipitor 40 mg daily.  2. Allopurinol 100 mg daily.  3. Aspirin 325 mg daily.  4. Metoprolol 50 mg twice daily.   ALLERGIES:  NKDA.   PHYSICAL EXAMINATION:  GENERAL:  The patient is alert and oriented.  He  is in no acute distress.  He is an obese male.  VITAL SIGNS:  Weight is 243 pounds, blood pressure is 168/98, heart rate  52, and respiratory rate 16.  HEENT:  Normal.  NECK:  Normal carotid upstrokes with a soft left carotid bruit.  JVP is  normal.  LUNGS:  Clear bilaterally.  HEART:  Regular rate and rhythm.  No murmurs or gallops.  ABDOMEN:  Soft, obese, nontender, and no organomegaly.  No bruits.  EXTREMITIES:  No clubbing, cyanosis, or edema.  Peripheral pulse is 2+  and equal throughout.  SKIN:  Warm and dry without rash.   EKG shows sinus bradycardia with first-degree AV block  and is otherwise  within normal limits.  The heart rate 53 beats per minute.   ASSESSMENT:  1. Coronary artery disease status post coronary artery bypass graft.      The patient is doing well with no angina.  I encouraged him to      begin an exercise program.  We also discussed dietary changes with      a goal towards weight loss.  2. Hypertension with suboptimal control.  He reports good blood      pressure readings at night time, but in the morning he has elevated      blood pressures.  I have added Norvasc 5 mg to his medical regimen.      We will follow up his blood pressure response at his return visit      and he will see him see Dr. Amador Cunas in the interim.  He had      problems with hyperkalemia with even low-dose ACE inhibitor, so I  would not recommend ACE inhibitors or ARB and his antihypertensive      regimen.  3. Dyslipidemia.  He is maintained on Lipitor 40 mg.  Lipids in July      showed a cholesterol of 140, HDL 38, LDL 84, and triglycerides 90.      We will repeat lipids and LFTs in 6 months at time of his return      visit.   For followup, I would like to see him back in 6 months.     [name], MD  Electronically Signed    MDC/MedQ  DD: 09/22/2008  DT: 09/22/2008  Job #: [id number]   cc:   Gordy Savers, MD

## 2011-03-21 NOTE — Cardiovascular Report (Signed)
NAMEERIKSON, DANZY              ACCOUNT NO.:  [id number]   MEDICAL RECORD NO.:  [id number]          PATIENT TYPE:  OIB   LOCATION:  1965                         FACILITY:  [facility]   PHYSICIAN:  [name], MD  DATE OF BIRTH:  1945/12/17   DATE OF PROCEDURE:  01/28/2008  DATE OF DISCHARGE:                            CARDIAC CATHETERIZATION   PROCEDURE:  Left heart catheterization, selective coronary angiography,  left ventricular angiography.   INDICATIONS:  [name] is a 65 year old gentleman who presented for  evaluation, due to typical angina with an abnormal stress test.  Mr.  Caffie Damme describes chest pressure with moderate level activity.  His  symptoms have been stable over approximately 1 year.  He underwent a  exercise Myoview study and exercised for 4 minutes and 30 seconds with  significant ST-segment changes, as well as a myocardial perfusion defect  showing ischemia in the inferolateral wall.  He was referred for cardiac  cath to evaluate his coronary status.   Risks and indications of the procedure were reviewed with the patient.  Informed consent was obtained.  The right groin was prepped, draped,  anesthetized with 1% lidocaine using the modified Seldinger technique.  A 4-French sheath was placed in the right femoral artery.  The patient  was given IV hydralazine, due to severe hypertension at beginning of the  procedure.  Standard 4-French Judkins catheters were used for coronary  angiography.  An angled pigtail catheter, 4-French angled pigtail  catheter, was used for left ventriculography.  The patient tolerated the  procedure well and had no immediate complications.   FINDINGS:  The left mainstem is patent is widely patent.  It is mildly  calcified.  The left main bifurcates into the LAD and left circumflex.   LAD:  The LAD is a large-caliber vessel that wraps around the left  ventricular apex.  The ostium of the LAD, which also involves high  diagonal  branch has a 80% stenosis just at the distal left mainstem,  extending into the ostium of the LAD.  The LAD is moderately calcified.  Just beyond the first diagonal branch, there is a 70% stenosis that  appears to be heavily calcified.  The remaining portions of the mid-  distal LAD are free of any significant angiographic stenosis.  The LAD  extends down and wraps around the left ventricular apex.   The high diagonal branch that courses in an intermediate territory has  ostial stenosis from the same, as it originates from the same area as  the LAD.  Further down but still in the proximal portion of the vessel,  there is a 75% stenosis present.  The vessel bifurcates into twin  vessels in its mid-portion.   The left circumflex is dominant.  It is moderately calcified.  The  proximal left circumflex has a 30% stenosis.  Circumflex then courses  down, and there is an obtuse marginal branch that is occluded.  Based on  the territory that is unaccounted for, I suspect it is a relatively  large OM branch.  The circumflex then courses down and supplies two  posterolateral branches in the left PDA branch.  There is no significant  stenosis throughout those branch vessels.   The right coronary artery is small and nondominant.  There is a 95%-99%  stenosis in the mid-portion of the vessel.  It supplies three RV  marginal branches that are all small.   Left ventriculography demonstrates normal LV function.  There are no  regional wall motion abnormalities.  The LVEF is estimated at 60%.   ASSESSMENT:  1. Severe three-vessel coronary artery disease.  2. Preserved left ventricular function.   PLAN:  [name] has multi-vessel coronary disease that will be best  treated with coronary bypass surgery.  His ostial LAD involvement  precludes stenting.  He also has total occlusion of what I suspect is a  large OM branch that hopefully will be graftable.  I think he will  benefit long-term from  complete revascularization with bypass surgery.  We will continue his medical therapy for now and obtain an outpatient  CVTS consult.      [name], MD  Electronically Signed     MDC/MEDQ  D:  01/28/2008  T:  01/28/2008  Job:  [id number]   cc:   Gordy Savers, MD

## 2011-03-21 NOTE — Assessment & Plan Note (Signed)
[facility]                                 ON-CALL NOTE   NAME:[name], [name]                     MRN:          [id number]  DATE:01/25/2008                            DOB:          [birth year]    Date of call January 25, 2007 at approximately 9 a.m.   PRIMARY CARE PHYSICIAN:  Gordy Savers, MD.   PRIMARY CARDIOLOGIST:  [name], MD.   SUPERVISING PHYSICIAN:  Rollene Rotunda, MD.   HISTORY:  [name] is a 65 year old male who was seen by Dr. Excell Seltzer on  January 24, 2008 with arrangements for outpatient cardiac catheterization  for Tuesday, January 28, 2008.  The patient was told to go to [facility] to get the pre-procedure chest x-ray and lab work done.  However, the patient's wife calls this morning stating that they are at  [facility].  They have obtained the chest x-ray, however, they  are at the lab and the lab did not have any orders for them.  The wife  states that the office stated that they would send the orders over to  the hospital.   I have spoken with the lab and they state that patients for outpatient  blood work are supposed to proceed to the center on [address].  However,  because that the patient is there at this time they would be willing to  do the labs.  Thus I have personally taken a prescription for a BMET,  PT/PTT and CBC to the lab so that [name] may have his blood work  drawn prior to his cardiac catheterization.      Joellyn Rued, PA-C  Electronically Signed      Gordy Savers, MD  Electronically Signed   EW/MedQ  DD: 01/25/2008  DT: 01/25/2008  Job #: [id number]

## 2011-03-21 NOTE — Assessment & Plan Note (Signed)
OFFICE VISIT   JAQUILLE, KAU  DOB:  December 04, 1945                                        Mar 12, 2008  CHART #:  [id number]   HISTORY:  Mr. Fjelstad returns to the office today in followup after his  coronary artery bypass grafting x4.  The patient started in cardiac  rehab today.  He notes that he has had marked improvement in his  shortness of breath with exertion.  Notes that he is able to walk around  his neighborhood on the routes that he had previously done with  significant shortness of breath and is now much improved.  He has had no  recurrent angina or evidence of congestive heart failure.   PHYSICAL EXAMINATION:  VITAL SIGNS:  Blood pressure 135/82, pulse 59,  respiratory rate 18, O2 sat is 98%.  CHEST:  His sternum is stable and well healed.  He has a small amount of  eschar on the left chest tube site, but it does not appear to have any  infection.  EXTREMITIES:  His right vein endo-harvest site is healing without  difficulty.  He has no pedal edema.   DIAGNOSTICS:  Follow up chest x-ray shows improved aeration with almost  complete resolution of bibasilar atelectasis.   MEDICATIONS:  1. He continues on colchicine.  2. Allopurinol.  3. Aspirin 325 mg a day.  4. Lisinopril 5 mg a day.  5. Lipitor 40 mg a day.  6. Lopressor 50 mg b.i.d.   Overall, I am very pleased with the progress.  I have encouraged him to  continue with the cardiac rehab program, to avoid any heavy lifting for  2-3 months.  I have allowed him to return to driving.  I have not made  him a return appointment to see me, but would be glad to see him at any  time.   He was found to have bilateral carotid disease at the time of surgery  with a 40-60% internal carotid stenosis on the right and 60-80% on the  left.  It would probably for him to have followup Doppler studies in 6  months. Will leave this to the [facility] to do in their office.   Sheliah Plane, MD  Electronically Signed   EG/MEDQ  D:  03/12/2008  T:  03/12/2008  Job:  [id number]   cc:   [name], MD

## 2011-03-21 NOTE — Assessment & Plan Note (Signed)
[facility]                            CARDIOLOGY OFFICE NOTE   NAME:[name]                     MRN:          [id number]  DATE:03/17/2008                            DOB:          1946-03-20    [name] returns for followup at the [facility] office on  Mar 17, 2008.  He recently underwent four-vessel bypass surgery by Dr.  Tyrone Wagner.  He initially presented with typical exertional angina and an  abnormal Myoview stress study.  He was found to have multivessel CAD and  was referred for bypass surgery.  He has really had an excellent  recovery since surgery, and he is currently feeling quite well.  He he  was previously experiencing angina with minimal activity; even with just  returning from his mailbox he described chest for chest pressure and  shortness of breath.  His symptoms have completely resolved.  He is able  to walk as long as he wants now, with no complaints.  He specifically  denies chest pain or pressure.  He has no dyspnea, orthopnea, PND, or  edema.  He complains of feeling of numbness over his left thigh that was  there even before surgery.  He is doing well on his current medications  and has no other complaints at present.   MEDICATIONS:  1. Lipitor 40 mg daily.  2. Allopurinol 100 mg daily.  3. Aspirin 325 mg daily.  4. Lisinopril 5 mg daily.  5. Metoprolol 50 mg twice daily.   ALLERGIES:  NKDA.   PHYSICAL EXAMINATION:  GENERAL:  The patient is alert and oriented.  He  is in no acute distress.  VITAL SIGNS:  Weight is 246, blood pressure 120/80, heart rate 70,  respiratory rate 12.  HEENT:  Normal.  NECK:  Normal carotid upstrokes, with a left carotid bruit.  LUNGS:  Clear bilaterally.  HEART:  Regular rate and rhythm, without murmurs or gallops.  Abdomen  soft, nontender.  No organomegaly.  No abdominal bruits.  EXTREMITIES:  No clubbing, cyanosis, or edema.  Peripheral pulses 2+ and  equal  throughout.   EKG:  Shows sinus rhythm, with a nonspecific T-wave abnormality.   ASSESSMENT:  1. Coronary artery disease, status post CABG.  [name] has had a      great response to bypass, with complete resolution of his angina.      In retrospect, he was clearly minimizing his symptoms      preoperatively.  He now is very pleased with how much he can do.      We will continue his current medications without changes, as he is      tolerating the combination of aspirin, lisinopril, and metoprolol.      His blood pressure and heart rate are under good control.  2. Asymptomatic carotid stenosis.  His preoperative carotid ultrasound      showed moderate left internal carotid artery stenosis.  Will follow      up with a carotid duplex in approximately 6 months.  3. Dyslipidemia.  He is on Lipitor 40 mg.  Follow up lipids  and LFTs      in 2 months.   [name] is doing well, as above.  He is participating in cardiac  rehab.  I would like to see him back in 3 months for followup.     Peter Fells. Excell Seltzer, MD  Electronically Signed    MDC/MedQ  DD: 03/17/2008  DT: 03/17/2008  Job #: [id number]   cc:   Peter Plane, MD  Peter Savers, MD

## 2011-03-21 NOTE — Op Note (Signed)
[name], [name]              ACCOUNT NO.:  [id number]   MEDICAL RECORD NO.:  [id number]          PATIENT TYPE:  INP   LOCATION:  2023                         FACILITY:  [facility]   PHYSICIAN:  Sheliah Plane, MD    DATE OF BIRTH:  Jan 06, 1946   DATE OF PROCEDURE:  02/10/2008  DATE OF DISCHARGE:  02/15/2008                               OPERATIVE REPORT   PREOPERATIVE DIAGNOSIS:  Coronary artery disease.   POSTOPERATIVE DIAGNOSIS:  Coronary artery disease.   SURGICAL PROCEDURE:  Coronary artery bypass grafting times four with a  free left internal mammary graft to the left anterior descending  coronary artery, reverse saphenous vein graft sequentially to the  intermediate and first obtuse marginal and reverse saphenous vein graft  to the distal right coronary artery with right thigh endovein  harvesting.   SURGEON:  Sheliah Plane, MD.   FIRST ASSISTANT:  Evelene Croon, M.D.   SECOND ASSISTANT:  Gershon Crane, [place].   BRIEF HISTORY:  The patient is a 65 year old male who presented with new  onset of angina manifested primarily by progressive exertional shortness  of breath.  He underwent evaluation by [facility] including  cardiac catheterization by Dr. Excell Seltzer which demonstrated significant  three-vessel coronary artery disease with severe disease in the LAD,  areas of 70% and 80% stenosis in the intermediate and obtuse marginal,  subtotal occlusion of the right coronary artery with preserved LV  function.   DESCRIPTION OF PROCEDURE:  Swan-Ganz and arterial line monitors placed.  The patient underwent general endotracheal anesthesia without incidence.  Skin of the chest and legs was prepped with Betadine and draped in usual  sterile manner.  Using the Guidant endovein harvesting system, vein was  harvested from the right thigh and calf and was of good quality and  caliber.  Median sternotomy was performed.  Left internal mammary artery  was dissected down.  The mammary  artery was not adherent to the chest  wall proximally and cut across through the mediastinal fat.  Because of  concern of injuring the artery with the Bovie while it was being  dissected down, the artery was clipped proximally and used as a free  graft.  The mammary itself , the portion uses a free graft was a very  large vessel.  Pericardium was opened, overall ventricular function  appeared preserved.  The patient was systemically heparinized,  ascending aorta and the right atrial cannulated.  Aortic root and  cardioplegia needle was introduced into the ascending aorta.  The  patient was placed on cardiopulmonary bypass 2.4 liters per minute per  meter square.  Sites of anastomosis were selected and dissected out of  epicardium.  The patient by this time was cooled to 30 degrees.  Aortic  crossclamp was applied , 500 mL of cold blood potassium cardioplegia was  administered with diastolic arrest of the heart.  Myocardial septal  temperature monitored throughout the crossclamp.  Attention was turned  first to the distal right coronary artery which was  a diffusely  diseased vessel, but admitted a 1.5-mm probe.  Using a running 7-0  Prolene  distal anastomosis was performed with a segment of reverse  saphenous vein graft.  The heart was then elevated and the intermediate  coronary artery which partially intramyocardial was opened, admitted a  1.5-mm probe. Usual diamond type side-to-side anastomosis was carried  out with a segment over saphenous vein graft.  Distal extent of the same  vein was then carried to the first obtuse marginal vessel which was  opened and admitted a 1.5-mm probe.  The circumflex proper, the terminal  end of it, was very small vessel and was not bypassable.  Attention was  then turned to the left anterior descending coronary artery which in the  midportion was opened and admitted to 1.5-mm probe distally.  Using  running 8-0 Prolene, the free left internal mammary  graft was  anastomosed to the left anterior descending coronary artery.  With  crossclamp still in place two punch aortotomies were performed.  Each of  the two vein grafts were anastomosed to the ascending aorta.  Off the  hood of the vein graft to the intermediate OM, the free mammary graft  was anastomosed with a running 7-0 Prolene.  The air was evacuated from  the heart and grafts and aortic crossclamp was removed with total  crossclamp time of 90 minutes.  The patient spontaneously converted to a  sinus rhythm.  Sites of anastomosis were inspected, free of bleeding.  The patient was then ventilated and weaned from cardiopulmonary bypass  without difficulty  and remained hemodynamically stable, was  decannulated in usual fashion.  Protamine sulfate was administered.  With operative field hemostatic, two atrial and two ventricular pacing  wires applied.  Graft marker applied.  Left pleural tube and a Blake  mediastinal drain were left in place.  Sternum was closed with #6  stainless steel wire.  Fascia closed with interrupted 0-Vicryl, running  3-0 Vicryl in the subcutaneous tissue, 4-0 subcuticular stitch in the  skin edges.  Dry dressings were applied.  Sponge and needle count was  reported as correct at completion of the procedure.  The patient  tolerated the procedure without obvious complications, was transferred  to the surgical intensive care unit for further postoperative care.      Sheliah Plane, MD  Electronically Signed     EG/MEDQ  D:  02/17/2008  T:  02/17/2008  Job:  [id number]   cc:   [name], MD

## 2011-03-21 NOTE — Consult Note (Signed)
[name], [name]              ACCOUNT NO.:  [id number]   MEDICAL RECORD NO.:  [id number]          PATIENT TYPE:  OUT   LOCATION:  VASC                         FACILITY:  [facility]   PHYSICIAN:  Sheliah Plane, MD    DATE OF BIRTH:  05/20/1946   DATE OF CONSULTATION:  01/30/2008  DATE OF DISCHARGE:  01/28/2008                                 CONSULTATION   REQUESTING PHYSICIAN:  [name], MD.   PRIMARY CARE PHYSICIAN:  Dr. Gwen Pounds at [facility].   REASON FOR CONSULTATION:  Coronary occlusive disease.   HISTORY OF PRESENT ILLNESS:  The patient is a 65 year old male who  presents with approximately 1 year history of substernal chest  discomfort and pain nonradiating with mild exertional shortness of  breath.  He notes that the discomfort primarily is with exertion while  walking up hills or walking up stairs.  He notes that the usual  activities around the house does not cause discomfort and pain.  The  discomfort is relieved within a few minutes of resting.  It does not  awaken him at night.  He has had no previous history of myocardial  infarction in the past.   Cardiac risk factors include hypertension and hyperlipidemia.  He just  started on medication in the last week to treat this.  Denies diabetes.  He is a remote smoker, smoked approximately half his life, but quit 7  months ago.  He has no family history of cardiac disease.  He has had no  previous history of stroke or claudication.  His baseline creatinine is  0.9.   PAST MEDICAL HISTORY:  Significant for severe gouty arthritis.  He has  had a long history of gout, but over the past several months, he has had  severe flare ups.  First with his left hand and now on the last month in  his right hand.   PAST SURGICAL HISTORY:  Bleeding ulcer treated with gastric resection in  the 1970s.   SOCIAL HISTORY:  The patient is married.  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  He does use alcohol  on a regular basis up to 6  beers a day.   FAMILY HISTORY:  Significant that his father died recently at age 30.  His mother died at age 15 of probably cardiac disease, but this is  unclear.  The patient notes that she was a heavy smoker.  He has  siblings with no history of cardiac disease.   CURRENT MEDICATIONS:  1. Colchicine 6 mg 4 times a day.  2. Bisoprolol 5 mg a day.  3. Allopurinol 100 mg a day.  4. Aspirin 81 mg a day.  5. Lipitor 40 mg a day.  6. Lisinopril 5 mg a day.  7. He also takes Advil and ibuprofen p.r.n.   ALLERGIES:  NONE KNOWN.   REVIEW OF SYSTEMS:  CARDIAC:  Positive for chest pain and mild  exertional shortness of breath.  He denies resting shortness of breath,  orthopnea, palpitations, syncope, presyncope, lower extremity edema.  GENERAL:  The patient notes  that his weight has been stable.  He denies  any amaurosis or TIAs.  He does wear reading glasses.  He denies  hemoptysis, wheezing or cough.  He has no history of gallstones or  abdominal discomfort.  Denies hematuria.  He does have significant joint  swelling and erythema in the hand and wrist joints.  He notes that he  has had blood in his stool 1 year ago.  He has no history of colonoscopy  being done in the past.  He denies previous stroke.   PHYSICAL EXAMINATION:  VITAL SIGNS:  Blood pressure is 193/101, pulse is  68, respiratory rate 18, O2 sats 95%.  He is 5 feet 11 inches tall,  weighs 260 pounds.  GENERAL:  Patient is awake, alert. neurologically intact.  NECK:  I do not appreciate any carotid bruits.  He has no jugular venous  distention.  LUNGS:  Clear bilaterally.  CARDIAC:  Regular rate and rhythm without murmur or gallop.  ABDOMEN:  Shows midline abdominal incision without palpable masses or  tenderness.  The right groin site is intact.  Cath site is intact.  EXTREMITIES:  He has superficial varicosities in both lower extremities.  Palpable DP and PT pulses bilaterally.    PROCEDURE:  The patient had a Myoview stress test, exercised for 4  minutes and 30 seconds with ST-segment changes and myocardial perfusion  defect showing ischemia of the inferolateral wall.  This led to a  cardiac catheterization done January 28, 2008 by Dr. Excell Seltzer which shows  three-vessel coronary artery disease and preserved LV function.  The  left main is patent.  LAD is a large wraparound LAD.  The ostium of the  LAD has 80% stenosis with moderate calcification, 70% stenosis in the  first diagonal.  The intermediate branch has a 70% stenosis.  The  circumflex is dominant.  There is a totally occluded large OM branch  that faintly fills with collaterals.  The right coronary artery is small  and nondominant. There is 95% stenosis in the mid right supplying  marginal branches.   Preop Doppler studies show 40-60% right ICA stenosis and 60-80% left ICA  stenosis.   LABORATORY FINDINGS:  The patient's hematocrit was 36.5, platelet count  323,000, BUN 12, creatinine 0.9,  cholesterol was 246, LDL was 184.   IMPRESSION:  1. Exertional angina with positive stress test and documented coronary      occlusive disease.  2. Carotid artery disease, bilateral.  3. Hypertension, severe.  4. Severe hyperlipidemia.  5. Obesity.  6. Severe gouty arthritis.   PLAN:  After reviewing the patient's catheterization films, history and  discussing the case with Dr. Excell Seltzer, I agree with the recommendation to  proceed with coronary artery bypass grafting.  The options have been  discussed with the patient and his wife in detail including the risk of  death, infection, stroke, myocardial infarction, bleeding, blood  transfusion.  The patient is willing to proceed.  However, he notes that  Saturday, February 08, 2008, his daughter is getting married and he wants to  wait until after that.  We will tentatively plan for surgery on February 10, 2008.  He is  warned to avoid any exertion.  Should he have chest pain,  to immediately  seek medical attention.  I have asked him to avoid until surgery any  nonsteroidal anti-inflammatories.  We will stop his lisinopril 48 hours  preop and continue his beta blocker up until the time of surgery.  Sheliah Plane, MD  Electronically Signed     EG/MEDQ  D:  01/30/2008  T:  01/30/2008  Job:  [id number]   cc:   [name], MD  Gwen Pounds, MD

## 2011-04-10 ENCOUNTER — Telehealth: Payer: Self-pay | Admitting: Cardiovascular Disease

## 2011-04-10 MED ORDER — ROSUVASTATIN CALCIUM 20 MG PO TABS: 20.0000 mg | ORAL_TABLET | Freq: Every day | ORAL | Status: DC

## 2011-04-10 NOTE — Telephone Encounter (Signed)
Needs refill crestor 20mg  uses [facility] [address] in [place]

## 2011-05-01 ENCOUNTER — Telehealth: Payer: Self-pay | Admitting: Internal Medicine

## 2011-05-01 MED ORDER — TRAMADOL HCL 50 MG PO TABS: 50.0000 mg | ORAL_TABLET | Freq: Four times a day (QID) | ORAL | Status: DC | PRN

## 2011-05-01 MED ORDER — ALLOPURINOL 100 MG PO TABS: 100.0000 mg | ORAL_TABLET | Freq: Every day | ORAL | Status: DC

## 2011-05-01 MED ORDER — AMLODIPINE BESYLATE 10 MG PO TABS: 10.0000 mg | ORAL_TABLET | Freq: Every day | ORAL | Status: DC

## 2011-05-01 NOTE — Telephone Encounter (Signed)
meds sent to [facility]. KIK

## 2011-05-01 NOTE — Telephone Encounter (Signed)
Needs new rxs for Allopurinol 100mg , Tramadol HCl 50mg , generic Norvasc 10mg  to new pharmacy-------[facility] @[id number]---ph---[phone]

## 2011-06-13 ENCOUNTER — Other Ambulatory Visit: Payer: Self-pay | Admitting: Internal Medicine

## 2011-06-13 MED ORDER — METOPROLOL TARTRATE 50 MG PO TABS: 50.0000 mg | ORAL_TABLET | Freq: Two times a day (BID) | ORAL | Status: DC

## 2011-06-13 NOTE — Telephone Encounter (Signed)
Pt needs Rx for Metoprolol 50Mg  sent to [facility]

## 2011-06-20 ENCOUNTER — Other Ambulatory Visit: Payer: Self-pay | Admitting: Internal Medicine

## 2011-07-25 ENCOUNTER — Other Ambulatory Visit: Payer: Self-pay | Admitting: Internal Medicine

## 2011-07-31 LAB — BASIC METABOLIC PANEL
BUN: 22
CO2: 23
Chloride: 107
Creatinine, Ser: 1.07
Glucose, Bld: 143 — ABNORMAL HIGH

## 2011-07-31 LAB — CBC
MCHC: 33.7
MCV: 88.4
Platelets: 323
RDW: 14.6

## 2011-07-31 LAB — PROTIME-INR: Prothrombin Time: 12.8

## 2011-08-01 LAB — TYPE AND SCREEN
ABO/RH(D): A POS
Antibody Screen: NEGATIVE

## 2011-08-01 LAB — COMPREHENSIVE METABOLIC PANEL
ALT: 21
AST: 16
Albumin: 3.5
Alkaline Phosphatase: 68
BUN: 26 — ABNORMAL HIGH
CO2: 20
Calcium: 9.6
Chloride: 109
Creatinine, Ser: 1.1
GFR calc Af Amer: >60
GFR calc non Af Amer: >60
Glucose, Bld: 112 — ABNORMAL HIGH
Potassium: 5.5 — ABNORMAL HIGH
Sodium: 136
Total Bilirubin: 0.8
Total Protein: 7.8

## 2011-08-01 LAB — POCT I-STAT 4, (NA,K, GLUC, HGB,HCT)
Glucose, Bld: 104 — ABNORMAL HIGH
Glucose, Bld: 106 — ABNORMAL HIGH
Glucose, Bld: 121 — ABNORMAL HIGH
Glucose, Bld: 124 — ABNORMAL HIGH
Glucose, Bld: 125 — ABNORMAL HIGH
Glucose, Bld: 126 — ABNORMAL HIGH
Glucose, Bld: 147 — ABNORMAL HIGH
Glucose, Bld: 167 — ABNORMAL HIGH
HCT: 19 — ABNORMAL LOW
HCT: 20 — ABNORMAL LOW
HCT: 21 — ABNORMAL LOW
HCT: 21 — ABNORMAL LOW
HCT: 28 — ABNORMAL LOW
HCT: 29 — ABNORMAL LOW
HCT: 34 — ABNORMAL LOW
HCT: 40
Hemoglobin: 11.6 — ABNORMAL LOW
Hemoglobin: 13.6
Hemoglobin: 6.5 — CL
Hemoglobin: 6.8 — CL
Hemoglobin: 7.1 — CL
Hemoglobin: 7.1 — CL
Hemoglobin: 9.5 — ABNORMAL LOW
Hemoglobin: 9.9 — ABNORMAL LOW
Operator id: 203371
Operator id: 238831
Operator id: 3406
Operator id: 3406
Operator id: 3406
Operator id: 3406
Operator id: 3406
Operator id: 3406
Potassium: 4.3
Potassium: 5.1
Potassium: 5.6 — ABNORMAL HIGH
Potassium: 5.6 — ABNORMAL HIGH
Potassium: 5.8 — ABNORMAL HIGH
Potassium: 5.8 — ABNORMAL HIGH
Potassium: 5.9 — ABNORMAL HIGH
Potassium: 6.2 — ABNORMAL HIGH
Sodium: 137
Sodium: 138
Sodium: 138
Sodium: 139
Sodium: 140
Sodium: 141
Sodium: 142
Sodium: 145

## 2011-08-01 LAB — CBC
HCT: 24.1 — ABNORMAL LOW
HCT: 24.2 — ABNORMAL LOW
HCT: 25.9 — ABNORMAL LOW
HCT: 27 — ABNORMAL LOW
HCT: 27.4 — ABNORMAL LOW
HCT: 28.7 — ABNORMAL LOW
HCT: 30.4 — ABNORMAL LOW
HCT: 35.8 — ABNORMAL LOW
Hemoglobin: 10.3 — ABNORMAL LOW
Hemoglobin: 12.1 — ABNORMAL LOW
Hemoglobin: 8.2 — ABNORMAL LOW
Hemoglobin: 8.4 — ABNORMAL LOW
Hemoglobin: 9.3 — ABNORMAL LOW
Hemoglobin: 9.4 — ABNORMAL LOW
Hemoglobin: 9.8 — ABNORMAL LOW
MCHC: 33.7
MCHC: 34
MCHC: 34
MCHC: 34.1
MCHC: 34.3
MCHC: 34.3
MCHC: 34.4
MCHC: 34.4
MCHC: 34.7
MCV: 88
MCV: 88.1
MCV: 88.2
MCV: 88.4
MCV: 88.5
MCV: 88.8
MCV: 89.1
MCV: 90.3
Platelets: 101 — ABNORMAL LOW
Platelets: 104 — ABNORMAL LOW
Platelets: 108 — ABNORMAL LOW
Platelets: 108 — ABNORMAL LOW
Platelets: 112 — ABNORMAL LOW
Platelets: 119 — ABNORMAL LOW
Platelets: 159
Platelets: 166
Platelets: 208
RBC: 2.69 — ABNORMAL LOW
RBC: 2.71 — ABNORMAL LOW
RBC: 2.93 — ABNORMAL LOW
RBC: 3.06 — ABNORMAL LOW
RBC: 3.11 — ABNORMAL LOW
RBC: 3.25 — ABNORMAL LOW
RBC: 3.45 — ABNORMAL LOW
RBC: 4.04 — ABNORMAL LOW
RDW: 14.7
RDW: 15.3
RDW: 15.4
RDW: 15.4
RDW: 15.6 — ABNORMAL HIGH
RDW: 15.8 — ABNORMAL HIGH
RDW: 16.4 — ABNORMAL HIGH
WBC: 10.5
WBC: 10.6 — ABNORMAL HIGH
WBC: 12.8 — ABNORMAL HIGH
WBC: 14 — ABNORMAL HIGH
WBC: 14.9 — ABNORMAL HIGH
WBC: 15.6 — ABNORMAL HIGH
WBC: 8.1
WBC: 8.5

## 2011-08-01 LAB — BASIC METABOLIC PANEL
BUN: 15
BUN: 16
BUN: 20
BUN: 21
BUN: 23
CO2: 21
CO2: 22
CO2: 22
CO2: 23
Calcium: 8.2 — ABNORMAL LOW
Calcium: 8.3 — ABNORMAL LOW
Calcium: 8.6
Calcium: 8.9
Chloride: 107
Chloride: 109
Chloride: 111
Chloride: 114 — ABNORMAL HIGH
Creatinine, Ser: 1.13
Creatinine, Ser: 1.18
Creatinine, Ser: 1.35
Creatinine, Ser: 1.47
Creatinine, Ser: 1.51 — ABNORMAL HIGH
GFR calc Af Amer: 58 — ABNORMAL LOW
GFR calc Af Amer: 59 — ABNORMAL LOW
GFR calc Af Amer: >60
GFR calc Af Amer: >60
GFR calc non Af Amer: 48 — ABNORMAL LOW
GFR calc non Af Amer: 49 — ABNORMAL LOW
GFR calc non Af Amer: >60
Glucose, Bld: 117 — ABNORMAL HIGH
Glucose, Bld: 132 — ABNORMAL HIGH
Glucose, Bld: 149 — ABNORMAL HIGH
Glucose, Bld: 177 — ABNORMAL HIGH
Potassium: 4.3
Potassium: 4.5
Potassium: 4.6
Sodium: 136
Sodium: 138
Sodium: 141

## 2011-08-01 LAB — MAGNESIUM: Magnesium: 2.2

## 2011-08-01 LAB — POCT I-STAT, CHEM 8
BUN: 16
BUN: 25 — ABNORMAL HIGH
Calcium, Ion: 1.28
Calcium, Ion: 1.33 — ABNORMAL HIGH
Chloride: 106
Chloride: 112
Creatinine, Ser: 1.1
Creatinine, Ser: 1.8 — ABNORMAL HIGH
Glucose, Bld: 142 — ABNORMAL HIGH
Glucose, Bld: 82
HCT: 26 — ABNORMAL LOW
HCT: 29 — ABNORMAL LOW
Hemoglobin: 8.8 — ABNORMAL LOW
Hemoglobin: 9.9 — ABNORMAL LOW
Potassium: 4.1
Potassium: 4.9
Sodium: 141
Sodium: 144
TCO2: 20
TCO2: 22

## 2011-08-01 LAB — POCT I-STAT 3, ART BLOOD GAS (G3+)
Acid-Base Excess: 11 — ABNORMAL HIGH
Acid-base deficit: 4 — ABNORMAL HIGH
Acid-base deficit: 4 — ABNORMAL HIGH
Acid-base deficit: 5 — ABNORMAL HIGH
Bicarbonate: 20.9
Bicarbonate: 20.9
Bicarbonate: 21.4
Bicarbonate: 35.8 — ABNORMAL HIGH
O2 Saturation: 100
O2 Saturation: 100
O2 Saturation: 99
O2 Saturation: 99
Operator id: 203371
Operator id: 241461
Operator id: 3406
Operator id: 3406
Patient temperature: 35.9
Patient temperature: 37.2
TCO2: 22
TCO2: 22
TCO2: 23
TCO2: 37
pCO2 arterial: 35
pCO2 arterial: 35.9
pCO2 arterial: 42.8
pCO2 arterial: 47.7 — ABNORMAL HIGH
pH, Arterial: 7.307 — ABNORMAL LOW
pH, Arterial: 7.367
pH, Arterial: 7.385
pH, Arterial: 7.484 — ABNORMAL HIGH
pO2, Arterial: 131 — ABNORMAL HIGH
pO2, Arterial: 163 — ABNORMAL HIGH
pO2, Arterial: 213 — ABNORMAL HIGH
pO2, Arterial: 311 — ABNORMAL HIGH

## 2011-08-01 LAB — POTASSIUM: Potassium: 5.6 — ABNORMAL HIGH

## 2011-08-01 LAB — BLOOD GAS, ARTERIAL
Acid-base deficit: 5.1 — ABNORMAL HIGH
Bicarbonate: 19.7 — ABNORMAL LOW
Drawn by: 181601
FIO2: 0.21
O2 Saturation: 95.2
Patient temperature: 98.6
TCO2: 20.9
pCO2 arterial: 37.9
pH, Arterial: 7.335 — ABNORMAL LOW
pO2, Arterial: 83.7

## 2011-08-01 LAB — PROTIME-INR
INR: 0.9
INR: 1.5
Prothrombin Time: 12.2
Prothrombin Time: 18 — ABNORMAL HIGH

## 2011-08-01 LAB — URINALYSIS, ROUTINE W REFLEX MICROSCOPIC
Bilirubin Urine: NEGATIVE
Glucose, UA: NEGATIVE
Hgb urine dipstick: NEGATIVE
Ketones, ur: NEGATIVE
Nitrite: NEGATIVE
Protein, ur: NEGATIVE
Specific Gravity, Urine: 1.016
Urobilinogen, UA: 0.2
pH: 6

## 2011-08-01 LAB — HEMOGLOBIN AND HEMATOCRIT, BLOOD
HCT: 19.6 — ABNORMAL LOW
Hemoglobin: 6.7 — CL

## 2011-08-01 LAB — ABO/RH: ABO/RH(D): A POS

## 2011-08-01 LAB — APTT
aPTT: 24
aPTT: 33

## 2011-08-01 LAB — HEMOGLOBIN A1C
Hgb A1c MFr Bld: 6.2 — ABNORMAL HIGH
Mean Plasma Glucose: 143

## 2011-08-01 LAB — CREATININE, SERUM
Creatinine, Ser: 0.87
GFR calc Af Amer: >60
GFR calc non Af Amer: >60

## 2011-08-01 LAB — PLATELET COUNT: Platelets: 118 — ABNORMAL LOW

## 2011-08-01 LAB — CK TOTAL AND CKMB (NOT AT ARMC)
CK, MB: 9.2 — ABNORMAL HIGH
Relative Index: 3 — ABNORMAL HIGH
Total CK: 304 — ABNORMAL HIGH

## 2011-09-05 ENCOUNTER — Other Ambulatory Visit: Payer: Self-pay | Admitting: Internal Medicine

## 2011-09-15 ENCOUNTER — Other Ambulatory Visit: Payer: Self-pay | Admitting: Internal Medicine

## 2011-09-15 MED ORDER — TRAMADOL HCL 50 MG PO TABS: 50.0000 mg | ORAL_TABLET | Freq: Four times a day (QID) | ORAL | Status: DC | PRN

## 2011-09-15 NOTE — Telephone Encounter (Signed)
#  30 disp - last seen 08/2010 - must be seen for future refills

## 2011-09-15 NOTE — Telephone Encounter (Signed)
Refill Ultram to [employer] -----[address] Thanks.

## 2011-10-23 ENCOUNTER — Other Ambulatory Visit: Payer: Self-pay | Admitting: Internal Medicine

## 2011-10-23 MED ORDER — COLCHICINE 0.6 MG PO TABS: 0.6000 mg | ORAL_TABLET | Freq: Every day | ORAL | Status: DC

## 2011-10-23 NOTE — Telephone Encounter (Signed)
Pt req refill of COLCHICINE 0.6 MG  TABS (COLCHICINE) one four times daily  #100 x 0 to [facility] on [address] [place] in [place] [phone]

## 2011-10-23 NOTE — Telephone Encounter (Signed)
ok 

## 2011-10-23 NOTE — Telephone Encounter (Signed)
Please advise - QID ?

## 2011-10-23 NOTE — Telephone Encounter (Signed)
escribed

## 2011-11-03 ENCOUNTER — Encounter: Payer: Self-pay | Admitting: Internal Medicine

## 2011-11-06 ENCOUNTER — Other Ambulatory Visit (INDEPENDENT_AMBULATORY_CARE_PROVIDER_SITE_OTHER): Payer: BC Managed Care – PPO

## 2011-11-06 DIAGNOSIS — Z0000 Encounter for general adult medical examination without abnormal findings: Secondary | ICD-10-CM

## 2011-11-06 LAB — CBC WITH DIFFERENTIAL/PLATELET
Basophils Absolute: 0.1 10*3/uL (ref 0.0–0.1)
Basophils Relative: 1.4 % (ref 0.0–3.0)
HCT: 33.9 % — ABNORMAL LOW (ref 39.0–52.0)
Hemoglobin: 11.4 g/dL — ABNORMAL LOW (ref 13.0–17.0)
Lymphocytes Relative: 32.6 % (ref 12.0–46.0)
Lymphs Abs: 1.9 10*3/uL (ref 0.7–4.0)
Monocytes Relative: 11.1 % (ref 3.0–12.0)
Neutro Abs: 3 10*3/uL (ref 1.4–7.7)
RBC: 4.08 Mil/uL — ABNORMAL LOW (ref 4.22–5.81)
RDW: 16.8 % — ABNORMAL HIGH (ref 11.5–14.6)

## 2011-11-06 LAB — HEPATIC FUNCTION PANEL
ALT: 19 U/L (ref 0–53)
AST: 20 U/L (ref 0–37)
Albumin: 3.3 g/dL — ABNORMAL LOW (ref 3.5–5.2)
Alkaline Phosphatase: 72 U/L (ref 39–117)
Total Protein: 7.4 g/dL (ref 6.0–8.3)

## 2011-11-06 LAB — LIPID PANEL: Cholesterol: 215 mg/dL — ABNORMAL HIGH (ref 0–200)

## 2011-11-06 LAB — BASIC METABOLIC PANEL
CO2: 27 mEq/L (ref 19–32)
Calcium: 9.3 mg/dL (ref 8.4–10.5)
GFR: 85.09 mL/min (ref >60.00)
Glucose, Bld: 167 mg/dL — ABNORMAL HIGH (ref 70–99)
Potassium: 4.8 mEq/L (ref 3.5–5.1)
Sodium: 136 mEq/L (ref 135–145)

## 2011-11-06 LAB — TSH: TSH: 1.23 u[IU]/mL (ref 0.35–5.50)

## 2011-11-06 LAB — POCT URINALYSIS DIPSTICK
Bilirubin, UA: NEGATIVE
Blood, UA: NEGATIVE
Glucose, UA: NEGATIVE
Ketones, UA: NEGATIVE
Spec Grav, UA: 1.015
Urobilinogen, UA: 0.2

## 2011-11-06 LAB — PSA: PSA: 0.28 ng/mL (ref 0.10–4.00)

## 2011-11-06 LAB — LDL CHOLESTEROL, DIRECT: Direct LDL: 130.4 mg/dL

## 2011-11-13 ENCOUNTER — Ambulatory Visit (INDEPENDENT_AMBULATORY_CARE_PROVIDER_SITE_OTHER): Payer: BC Managed Care – PPO | Admitting: Internal Medicine

## 2011-11-13 ENCOUNTER — Encounter: Payer: Self-pay | Admitting: Internal Medicine

## 2011-11-13 DIAGNOSIS — Z0000 Encounter for general adult medical examination without abnormal findings: Secondary | ICD-10-CM

## 2011-11-13 DIAGNOSIS — R7302 Impaired glucose tolerance (oral): Secondary | ICD-10-CM

## 2011-11-13 DIAGNOSIS — Z23 Encounter for immunization: Secondary | ICD-10-CM

## 2011-11-13 DIAGNOSIS — I2510 Atherosclerotic heart disease of native coronary artery without angina pectoris: Secondary | ICD-10-CM

## 2011-11-13 DIAGNOSIS — E739 Lactose intolerance, unspecified: Secondary | ICD-10-CM

## 2011-11-13 DIAGNOSIS — E785 Hyperlipidemia, unspecified: Secondary | ICD-10-CM

## 2011-11-13 DIAGNOSIS — I10 Essential (primary) hypertension: Secondary | ICD-10-CM

## 2011-11-13 DIAGNOSIS — R7309 Other abnormal glucose: Secondary | ICD-10-CM

## 2011-11-13 LAB — HEMOGLOBIN A1C: Hgb A1c MFr Bld: 7.2 % — ABNORMAL HIGH (ref 4.6–6.5)

## 2011-11-13 MED ORDER — LISINOPRIL 20 MG PO TABS: 20.0000 mg | ORAL_TABLET | Freq: Every day | ORAL | Status: DC

## 2011-11-13 MED ORDER — AMLODIPINE BESYLATE 10 MG PO TABS: 10.0000 mg | ORAL_TABLET | Freq: Every day | ORAL | Status: DC

## 2011-11-13 MED ORDER — METFORMIN HCL ER (MOD) 1000 MG PO TB24: 1000.0000 mg | ORAL_TABLET | Freq: Every day | ORAL | Status: DC

## 2011-11-13 MED ORDER — ATORVASTATIN CALCIUM 40 MG PO TABS: 40.0000 mg | ORAL_TABLET | Freq: Every day | ORAL | Status: DC

## 2011-11-13 MED ORDER — ALLOPURINOL 100 MG PO TABS: 100.0000 mg | ORAL_TABLET | Freq: Every day | ORAL | Status: DC

## 2011-11-13 NOTE — Patient Instructions (Signed)
Limit your sodium (Salt) intake    It is important that you exercise regularly, at least 20 minutes 3 to 4 times per week.  If you develop chest pain or shortness of breath seek  medical attention.  You need to lose weight.  Consider a lower calorie diet and regular exercise.  Return in 6 months for follow-up   

## 2011-11-13 NOTE — Progress Notes (Signed)
Subjective:    Patient ID: [name], male    DOB: December 09, 1945, 66 y.o.   MRN: [id number]  HPI   66 year old patient who is seen today for a wellness exam. He is status post CABG in 2009. He has hypertension dyslipidemia and impaired glucose intolerance. He has exogenous obesity. He has not been taking Crestor due to cost considerations. No prior screening colonoscopy  Past Medical History  Diagnosis Date  . CAD, ARTERY BYPASS GRAFT 02/10/2008  . CAROTID ARTERY STENOSIS, WITHOUT INFARCTION 09/22/2008  . CORONARY ARTERY DISEASE 04/03/2008  . ERECTILE DYSFUNCTION 01/14/2009  . GOUT 01/16/2008  . HERPES ZOSTER 04/15/2010  . HYPERLIPIDEMIA 01/16/2008  . HYPERTENSION 01/16/2008  . IMPAIRED GLUCOSE TOLERANCE 08/22/2010    History   Social History  . Marital Status: Married    Spouse Name: N/A    Number of Children: N/A  . Years of Education: N/A   Occupational History  . Not on file.   Social History Main Topics  . Smoking status: Former Smoker    Quit date: 11/06/1996  . Smokeless tobacco: Never Used  . Alcohol Use: Yes  . Drug Use: No  . Sexually Active: Not on file   Other Topics Concern  . Not on file   Social History Narrative  . No narrative on file    Past Surgical History  Procedure Date  . Partial gastrectomy   . Coronary artery bypass graft     Family History  Problem Relation Age of Onset  . Heart disease Mother   . Heart disease Sister     No Known Allergies  Current Outpatient Prescriptions on File Prior to Visit  Medication Sig Dispense Refill  . allopurinol (ZYLOPRIM) 100 MG tablet Take 1 tablet (100 mg total) by mouth daily.  90 tablet  3  . amLODipine (NORVASC) 10 MG tablet Take 1 tablet (10 mg total) by mouth daily.  90 tablet  3  . aspirin 325 MG tablet Take 325 mg by mouth daily.        . colchicine 0.6 MG tablet Take 1 tablet (0.6 mg total) by mouth daily.  100 tablet  0  . lisinopril (PRINIVIL,ZESTRIL) 20 MG tablet Take 20 mg by mouth  daily.        . metoprolol (LOPRESSOR) 50 MG tablet Take 1 tablet (50 mg total) by mouth 2 (two) times daily.  180 tablet  0  . nitroGLYCERIN (NITROSTAT) 0.4 MG SL tablet Place 0.4 mg under the tongue every 5 (five) minutes as needed.        . rosuvastatin (CRESTOR) 20 MG tablet Take 1 tablet (20 mg total) by mouth at bedtime.  30 tablet  11  . traMADol (ULTRAM) 50 MG tablet Take 1 tablet (50 mg total) by mouth every 6 (six) hours as needed for pain. Maximum dose= 8 tablets per day  30 tablet  0    BP 110/74  Pulse 72  Temp(Src) 98.2 F (36.8 C) (Oral)  Resp 18  Ht 5\' 10"  (1.778 m)  Wt 268 lb (121.564 kg)  BMI 38.45 kg/m2  SpO2 98%    Wt Readings from Last 3 Encounters:  11/13/11 268 lb (121.564 kg)  08/22/10 266 lb (120.657 kg)  04/15/10 268 lb (121.564 kg)     Review of Systems  Constitutional: Negative for fever, chills, activity change, appetite change and fatigue.  HENT: Negative for hearing loss, ear pain, congestion, rhinorrhea, sneezing, mouth sores, trouble swallowing, neck pain, neck stiffness, dental  problem, voice change, sinus pressure and tinnitus.   Eyes: Negative for photophobia, pain, redness and visual disturbance.  Respiratory: Negative for apnea, cough, choking, chest tightness, shortness of breath and wheezing.   Cardiovascular: Negative for chest pain, palpitations and leg swelling.  Gastrointestinal: Negative for nausea, vomiting, abdominal pain, diarrhea, constipation, blood in stool, abdominal distention, anal bleeding and rectal pain.  Genitourinary: Negative for dysuria, urgency, frequency, hematuria, flank pain, decreased urine volume, discharge, penile swelling, scrotal swelling, difficulty urinating, genital sores and testicular pain.  Musculoskeletal: Negative for myalgias, back pain, joint swelling, arthralgias and gait problem.  Skin: Negative for color change, rash and wound.  Neurological: Negative for dizziness, tremors, seizures, syncope,  facial asymmetry, speech difficulty, weakness, light-headedness, numbness and headaches.  Hematological: Negative for adenopathy. Does not bruise/bleed easily.  Psychiatric/Behavioral: Negative for suicidal ideas, hallucinations, behavioral problems, confusion, sleep disturbance, self-injury, dysphoric mood, decreased concentration and agitation. The patient is not nervous/anxious.        Objective:   Physical Exam  Constitutional: He appears well-developed and well-nourished.       Obese  HENT:  Head: Normocephalic and atraumatic.  Right Ear: External ear normal.  Left Ear: External ear normal.  Nose: Nose normal.  Mouth/Throat: Oropharynx is clear and moist.  Eyes: Conjunctivae and EOM are normal. Pupils are equal, round, and reactive to light. No scleral icterus.  Neck: Normal range of motion. Neck supple. No JVD present. No thyromegaly present.  Cardiovascular: Regular rhythm, normal heart sounds and intact distal pulses.  Exam reveals no gallop and no friction rub.   No murmur heard. Pulmonary/Chest: Effort normal and breath sounds normal. He exhibits no tenderness.       Sternotomy scar  Abdominal: Soft. Bowel sounds are normal. He exhibits no distension and no mass. There is no tenderness.  Genitourinary: Prostate normal and penis normal.  Musculoskeletal: Normal range of motion. He exhibits no edema and no tenderness.  Lymphadenopathy:    He has no cervical adenopathy.  Neurological: He is alert. He has normal reflexes. No cranial nerve deficit. Coordination normal.  Skin: Skin is warm and dry. No rash noted.  Psychiatric: He has a normal mood and affect. His behavior is normal.          Assessment & Plan:   Preventive health Coronary artery disease Hypertension Dyslipidemia. We'll discontinue Crestor and start atorvastatin 40 Impaired glucose tolerance. We'll check a hemoglobin A1c start metformin  Recheck 3 months  Lifestyle issues discussed

## 2011-12-19 ENCOUNTER — Other Ambulatory Visit: Payer: Self-pay | Admitting: Internal Medicine

## 2011-12-19 MED ORDER — TRAMADOL HCL 50 MG PO TABS: 50.0000 mg | ORAL_TABLET | Freq: Four times a day (QID) | ORAL | Status: DC | PRN

## 2011-12-19 NOTE — Telephone Encounter (Signed)
Pt need refill on tramadol 50 mg requesting #[facility] [phone]

## 2011-12-21 ENCOUNTER — Other Ambulatory Visit: Payer: Self-pay | Admitting: Internal Medicine

## 2012-01-08 ENCOUNTER — Other Ambulatory Visit: Payer: Self-pay | Admitting: Internal Medicine

## 2012-02-05 ENCOUNTER — Other Ambulatory Visit: Payer: Self-pay | Admitting: Internal Medicine

## 2012-05-07 ENCOUNTER — Ambulatory Visit (INDEPENDENT_AMBULATORY_CARE_PROVIDER_SITE_OTHER): Payer: BC Managed Care – PPO | Admitting: Internal Medicine

## 2012-05-07 ENCOUNTER — Encounter: Payer: Self-pay | Admitting: Internal Medicine

## 2012-05-07 VITALS — BP 116/80 | Temp 98.3°F | Wt 270.0 lb

## 2012-05-07 DIAGNOSIS — I25810 Atherosclerosis of coronary artery bypass graft(s) without angina pectoris: Secondary | ICD-10-CM

## 2012-05-07 DIAGNOSIS — R7302 Impaired glucose tolerance (oral): Secondary | ICD-10-CM

## 2012-05-07 DIAGNOSIS — R7309 Other abnormal glucose: Secondary | ICD-10-CM

## 2012-05-07 DIAGNOSIS — R109 Unspecified abdominal pain: Secondary | ICD-10-CM

## 2012-05-07 DIAGNOSIS — Z0000 Encounter for general adult medical examination without abnormal findings: Secondary | ICD-10-CM

## 2012-05-07 LAB — GLUCOSE, POCT (MANUAL RESULT ENTRY): POC Glucose: 145 mg/dl — AB (ref 70–99)

## 2012-05-07 NOTE — Progress Notes (Signed)
  Subjective:    Patient ID: PARRY PO, male    DOB: 1946/05/14, 66 y.o.   MRN: [id number]  HPI  66 year old patient has a history of type 2 diabetes. He presents with a one-week history of mild lower mid abdominal pain. No change in his bowel habits. No weight loss nausea or vomiting. He has remote history of peptic ulcer disease and is status post the surgery.    Review of Systems  Constitutional: Negative for fever, chills, appetite change and fatigue.  HENT: Negative for hearing loss, ear pain, congestion, sore throat, trouble swallowing, neck stiffness, dental problem, voice change and tinnitus.   Eyes: Negative for pain, discharge and visual disturbance.  Respiratory: Negative for cough, chest tightness, wheezing and stridor.   Cardiovascular: Negative for chest pain, palpitations and leg swelling.  Gastrointestinal: Positive for abdominal pain. Negative for nausea, vomiting, diarrhea, constipation, blood in stool and abdominal distention.  Genitourinary: Negative for urgency, hematuria, flank pain, discharge, difficulty urinating and genital sores.  Musculoskeletal: Negative for myalgias, back pain, joint swelling, arthralgias and gait problem.  Skin: Negative for rash.  Neurological: Negative for dizziness, syncope, speech difficulty, weakness, numbness and headaches.  Hematological: Negative for adenopathy. Does not bruise/bleed easily.  Psychiatric/Behavioral: Negative for behavioral problems and dysphoric mood. The patient is not nervous/anxious.        Objective:   Physical Exam  Constitutional: He is oriented to person, place, and time. He appears well-developed.  HENT:  Head: Normocephalic.  Right Ear: External ear normal.  Left Ear: External ear normal.  Eyes: Conjunctivae and EOM are normal.  Neck: Normal range of motion.  Cardiovascular: Normal rate and normal heart sounds.   Pulmonary/Chest: Breath sounds normal.  Abdominal: Soft. Bowel sounds are normal. He  exhibits no distension. There is no tenderness. There is no rebound.       Upper midline scar  Musculoskeletal: Normal range of motion. He exhibits no edema and no tenderness.  Neurological: He is alert and oriented to person, place, and time.  Psychiatric: He has a normal mood and affect. His behavior is normal.          Assessment & Plan:   Nonspecific lower abdominal pain. We'll set up for colonoscopy. This apparently was not scheduled in January as anticipated Diabetes mellitus. We'll check a hemoglobin A1c Hypertension stable

## 2012-05-07 NOTE — Patient Instructions (Addendum)
DASH Diet The DASH diet stands for "Dietary Approaches to Stop Hypertension." It is a healthy eating plan that has been shown to reduce high blood pressure (hypertension) in as little as 14 days, while also possibly providing other significant health benefits. These other health benefits include reducing the risk of breast cancer after menopause and reducing the risk of type 2 diabetes, heart disease, colon cancer, and stroke. Health benefits also include weight loss and slowing kidney failure in patients with chronic kidney disease.   DIET GUIDELINES  Limit salt (sodium). Your diet should contain less than 1500 mg of sodium daily.   Limit refined or processed carbohydrates. Your diet should include mostly whole grains. Desserts and added sugars should be used sparingly.   Include small amounts of heart-healthy fats. These types of fats include nuts, oils, and tub margarine. Limit saturated and trans fats. These fats have been shown to be harmful in the body.  CHOOSING FOODS   The following food groups are based on a 2000 calorie diet. See your Registered Dietitian for individual calorie needs. Grains and Grain Products (6 to 8 servings daily)  Eat More Often: Whole-wheat bread, brown rice, whole-grain or wheat pasta, quinoa, popcorn without added fat or salt (air popped).   Eat Less Often: White bread, white pasta, white rice, cornbread.  Vegetables (4 to 5 servings daily)  Eat More Often: Fresh, frozen, and canned vegetables. Vegetables may be raw, steamed, roasted, or grilled with a minimal amount of fat.   Eat Less Often/Avoid: Creamed or fried vegetables. Vegetables in a cheese sauce.  Fruit (4 to 5 servings daily)  Eat More Often: All fresh, canned (in natural juice), or frozen fruits. Dried fruits without added sugar. One hundred percent fruit juice ( cup [237 mL] daily).   Eat Less Often: Dried fruits with added sugar. Canned fruit in light or heavy syrup.  [employer], Fish, and  Poultry (2 servings or less daily. One serving is 3 to 4 oz [85-114 g]).  Eat More Often: Ninety percent or leaner ground beef, tenderloin, sirloin. Round cuts of beef, chicken breast, [place] breast. All fish. Grill, bake, or broil your meat. Nothing should be fried.   Eat Less Often/Avoid: Fatty cuts of meat, [place], or chicken leg, thigh, or wing. Fried cuts of meat or fish.  Dairy (2 to 3 servings)  Eat More Often: Low-fat or fat-free milk, low-fat plain or light yogurt, reduced-fat or part-skim cheese.   Eat Less Often/Avoid: Milk (whole, 2%, skim, or chocolate). Whole milk yogurt. Full-fat cheeses.  Nuts, Seeds, and Legumes (4 to 5 servings per week)  Eat More Often: All without added salt.   Eat Less Often/Avoid: Salted nuts and seeds, canned beans with added salt.  Fats and Sweets (limited)  Eat More Often: Vegetable oils, tub margarines without trans fats, sugar-free gelatin. Mayonnaise and salad dressings.   Eat Less Often/Avoid: Coconut oils, palm oils, butter, stick margarine, cream, half and half, cookies, candy, pie.  FOR MORE INFORMATION The Dash Diet Eating Plan: www.dashdiet.org Document Released: 10/12/2011 Document Reviewed: 10/02/2011 [facility] Patient Information 2012 [place], [place].  Please see your eye doctor yearly to check for diabetic eye damage  Schedule your colonoscopy to help detect colon cancer.

## 2012-05-08 ENCOUNTER — Other Ambulatory Visit: Payer: Self-pay | Admitting: *Deleted

## 2012-05-08 MED ORDER — METFORMIN HCL 1000 MG PO TABS: 1000.0000 mg | ORAL_TABLET | Freq: Two times a day (BID) | ORAL | Status: DC

## 2012-05-13 ENCOUNTER — Encounter: Payer: Self-pay | Admitting: Gastroenterology

## 2012-05-30 ENCOUNTER — Other Ambulatory Visit: Payer: Self-pay | Admitting: Internal Medicine

## 2012-06-10 ENCOUNTER — Ambulatory Visit (AMBULATORY_SURGERY_CENTER): Payer: BC Managed Care – PPO | Admitting: *Deleted

## 2012-06-10 VITALS — Ht 70.0 in | Wt 267.9 lb

## 2012-06-10 DIAGNOSIS — Z1211 Encounter for screening for malignant neoplasm of colon: Secondary | ICD-10-CM

## 2012-06-10 MED ORDER — MOVIPREP 100 G PO SOLR: ORAL | Status: DC

## 2012-06-11 ENCOUNTER — Encounter: Payer: Self-pay | Admitting: Gastroenterology

## 2012-06-21 ENCOUNTER — Telehealth: Payer: Self-pay | Admitting: *Deleted

## 2012-06-24 ENCOUNTER — Ambulatory Visit (AMBULATORY_SURGERY_CENTER): Payer: BC Managed Care – PPO | Admitting: Gastroenterology

## 2012-06-24 ENCOUNTER — Encounter: Payer: Self-pay | Admitting: Gastroenterology

## 2012-06-24 VITALS — BP 141/76 | HR 92 | Temp 99.1°F | Resp 20 | Ht 70.0 in | Wt 267.0 lb

## 2012-06-24 DIAGNOSIS — D129 Benign neoplasm of anus and anal canal: Secondary | ICD-10-CM

## 2012-06-24 DIAGNOSIS — Z1211 Encounter for screening for malignant neoplasm of colon: Secondary | ICD-10-CM

## 2012-06-24 DIAGNOSIS — D126 Benign neoplasm of colon, unspecified: Secondary | ICD-10-CM

## 2012-06-24 DIAGNOSIS — D128 Benign neoplasm of rectum: Secondary | ICD-10-CM

## 2012-06-24 DIAGNOSIS — K5730 Diverticulosis of large intestine without perforation or abscess without bleeding: Secondary | ICD-10-CM

## 2012-06-24 LAB — GLUCOSE, CAPILLARY: Glucose-Capillary: 119 mg/dL — ABNORMAL HIGH (ref 70–99)

## 2012-06-24 MED ORDER — SODIUM CHLORIDE 0.9 % IV SOLN: 500.0000 mL | INTRAVENOUS | Status: DC

## 2012-06-24 NOTE — Op Note (Addendum)
[facility] [address] [place], [address]   COLONOSCOPY PROCEDURE REPORT  PATIENT: [name], Peter J.  MR#: [id number] BIRTHDATE: November 08, 1945 , 65  yrs. old GENDER: Male ENDOSCOPIST: Mardella Layman, MD, [facility] REFERRED BY:  Eleonore Chiquito, M.D. PROCEDURE DATE:  06/24/2012 PROCEDURE:   Colonoscopy with biopsy ASA CLASS:   Class II INDICATIONS:average risk patient for colon cancer. MEDICATIONS: mg  DESCRIPTION OF PROCEDURE:   After the risks and benefits and of the procedure were explained, informed consent was obtained.  Digital rectal exam was performed and revealed A digital rectal exam revealed no abnormalities of the rectum.  The LB [id number] endoscope was introduced through the anus and advanced to the cecum, which was identified by both the appendix and ileocecal valve .  The quality of the prep was excellent, using MoviPrep . The instrument was then slowly withdrawn as the colon was fully examined.    FINDINGS:  1. A smooth sessile polyp ranging between 3-24mm in size was found at the hepatic flexure.  A polypectomy was performed using snare cautery.  The resection was complete and the polyp tissue was completely retrieved. 2.  Multiple smooth flat polyps ranging between 3-66mm in size were found in the rectum.  A polypectomy was performed using snare cautery.  2 tissue jars right colon and rectum.Diverticulosis in sigmoid area noted.  Retroflexed views in the rectum revealed no abnormalities.   The scope was then withdrawn from the patient and the procedure completed.  COMPLICATIONS: None.  ENDOSCOPIC IMPRESSION: 1.   Sessile polyp ranging between 3-69mm in size was found at the hepatic flexure; polypectomy was performed using snare cautery 2.   Multiple flat polyps ranging between 3-70mm in size were found in the rectum; polypectomy was performed using snare cautery 3.    Diverticulosis RECOMMENDATIONS: 1.  Await biopsy results 2.   High fiber diet.  REPEAT EXAM:  standard discharge  _______________________________ eSignedMardella Layman, MD, [facility] 06/24/2012 11:08 AM Revised: 06/24/2012 11:08 AM

## 2012-06-24 NOTE — Progress Notes (Signed)
PROPOFOL PER S CAMP CRNA, SEE SCANNED INTRA PROCEDURE REPORT. EWM

## 2012-06-24 NOTE — Progress Notes (Signed)
Patient did not have preoperative order for IV antibiotic SSI prophylaxis. (G8918)  Patient did not experience any of the following events: a burn prior to discharge; a fall within the facility; wrong site/side/patient/procedure/implant event; or a hospital transfer or hospital admission upon discharge from the facility. (G8907)  

## 2012-06-24 NOTE — Patient Instructions (Signed)
YOU HAD AN ENDOSCOPIC PROCEDURE TODAY AT THE [facility]: Refer to the procedure report that was given to you for any specific questions about what was found during the examination.  If the procedure report does not answer your questions, please call your gastroenterologist to clarify.  If you requested that your care partner not be given the details of your procedure findings, then the procedure report has been included in a sealed envelope for you to review at your convenience later.  YOU SHOULD EXPECT: Some feelings of bloating in the abdomen. Passage of more gas than usual.  Walking can help get rid of the air that was put into your GI tract during the procedure and reduce the bloating. If you had a lower endoscopy (such as a colonoscopy or flexible sigmoidoscopy) you may notice spotting of blood in your stool or on the toilet paper. If you underwent a bowel prep for your procedure, then you may not have a normal bowel movement for a few days.  DIET: Your first meal following the procedure should be a light meal and then it is ok to progress to your normal diet.  A half-sandwich or bowl of soup is an example of a good first meal.  Heavy or fried foods are harder to digest and may make you feel nauseous or bloated.  Likewise meals heavy in dairy and vegetables can cause extra gas to form and this can also increase the bloating.  Drink plenty of fluids but you should avoid alcoholic beverages for 24 hours.  ACTIVITY: Your care partner should take you home directly after the procedure.  You should plan to take it easy, moving slowly for the rest of the day.  You can resume normal activity the day after the procedure however you should NOT DRIVE or use heavy machinery for 24 hours (because of the sedation medicines used during the test).    SYMPTOMS TO REPORT IMMEDIATELY: A gastroenterologist can be reached at any hour.  During normal business hours, 8:30 AM to 5:00 PM Monday through Friday,  call [phone].  After hours and on weekends, please call the GI answering service at [phone] who will take a message and have the physician on call contact you.   Following lower endoscopy (colonoscopy or flexible sigmoidoscopy):  Excessive amounts of blood in the stool  Significant tenderness or worsening of abdominal pains  Swelling of the abdomen that is new, acute  Fever of 100F or higher    FOLLOW UP: If any biopsies were taken you will be contacted by phone or by letter within the next 1-3 weeks.  Call your gastroenterologist if you have not heard about the biopsies in 3 weeks.  Our staff will call the home number listed on your records the next business day following your procedure to check on you and address any questions or concerns that you may have at that time regarding the information given to you following your procedure. This is a courtesy call and so if there is no answer at the home number and we have not heard from you through the emergency physician on call, we will assume that you have returned to your regular daily activities without incident.  SIGNATURES/CONFIDENTIALITY: You and/or your care partner have signed paperwork which will be entered into your electronic medical record.  These signatures attest to the fact that that the information above on your After Visit Summary has been reviewed and is understood.  Full responsibility of the confidentiality   of this discharge information lies with you and/or your care-partner.     INFORMATION ON POLYPS, DIVERTICULOSIS, & HIGH FIBER DIET GIVEN TO YOU TODAY 

## 2012-06-25 ENCOUNTER — Telehealth: Payer: Self-pay

## 2012-06-25 NOTE — Telephone Encounter (Signed)
  Follow up Call-  Call back number 06/24/2012  Post procedure Call Back phone  # [phone]  Permission to leave phone message Yes     Patient questions:  Do you have a fever, pain , or abdominal swelling? no Pain Score  0 *  Have you tolerated food without any problems? yes  Have you been able to return to your normal activities? yes  Do you have any questions about your discharge instructions: Diet   no Medications  no Follow up visit  no  Do you have questions or concerns about your Care? no  Actions: * If pain score is 4 or above: No action needed, pain <4.

## 2012-06-28 ENCOUNTER — Encounter: Payer: Self-pay | Admitting: Internal Medicine

## 2012-06-28 ENCOUNTER — Encounter: Payer: Self-pay | Admitting: Gastroenterology

## 2012-06-28 ENCOUNTER — Ambulatory Visit (INDEPENDENT_AMBULATORY_CARE_PROVIDER_SITE_OTHER): Payer: BC Managed Care – PPO | Admitting: Internal Medicine

## 2012-06-28 VITALS — BP 120/88 | HR 79 | Temp 98.5°F | Wt 266.0 lb

## 2012-06-28 DIAGNOSIS — I10 Essential (primary) hypertension: Secondary | ICD-10-CM

## 2012-06-28 DIAGNOSIS — E739 Lactose intolerance, unspecified: Secondary | ICD-10-CM

## 2012-06-28 DIAGNOSIS — R1900 Intra-abdominal and pelvic swelling, mass and lump, unspecified site: Secondary | ICD-10-CM

## 2012-06-28 NOTE — Progress Notes (Signed)
Subjective:    Patient ID: [name], male    DOB: [birth year], 66 y.o.   MRN: [id number]  HPI  66 year old patient who presents with a chief complaint of a mass in the lower midline abdomen is noted about one week ago. There's been some minimal tenderness. He did have a colonoscopy earlier this week. No nausea vomiting or change in his bowel habits.  Past Medical History  Diagnosis Date  . CAD, ARTERY BYPASS GRAFT 02/10/2008  . CAROTID ARTERY STENOSIS, WITHOUT INFARCTION 09/22/2008  . CORONARY ARTERY DISEASE 04/03/2008  . ERECTILE DYSFUNCTION 01/14/2009  . GOUT 01/16/2008  . HERPES ZOSTER 04/15/2010  . HYPERLIPIDEMIA 01/16/2008  . HYPERTENSION 01/16/2008  . IMPAIRED GLUCOSE TOLERANCE 08/22/2010  . Ulcer     History   Social History  . Marital Status: Married    Spouse Name: N/A    Number of Children: N/A  . Years of Education: N/A   Occupational History  . Not on file.   Social History Main Topics  . Smoking status: Former Smoker    Quit date: 11/06/1996  . Smokeless tobacco: Never Used  . Alcohol Use: 21.0 oz/week    35 Cans of beer per week  . Drug Use: No  . Sexually Active: Not on file   Other Topics Concern  . Not on file   Social History Narrative  . No narrative on file    Past Surgical History  Procedure Date  . Partial gastrectomy 1974    bleeding ulcers  . Coronary artery bypass graft 2009    vessels x3    Family History  Problem Relation Age of Onset  . Heart disease Mother   . Heart disease Sister   . Colon cancer Neg Hx     No Known Allergies  Current Outpatient Prescriptions on File Prior to Visit  Medication Sig Dispense Refill  . allopurinol (ZYLOPRIM) 100 MG tablet Take 1 tablet (100 mg total) by mouth daily.  90 tablet  3  . amLODipine (NORVASC) 10 MG tablet Take 1 tablet (10 mg total) by mouth daily.  90 tablet  3  . aspirin 325 MG tablet Take 325 mg by mouth daily.        Marland Kitchen atorvastatin (LIPITOR) 40 MG tablet Take 1 tablet (40 mg  total) by mouth daily.  90 tablet  3  . Cholecalciferol (VITAMIN D3) 1000 UNITS CAPS Take 1 capsule by mouth daily.      . colchicine 0.6 MG tablet Take 1 tablet (0.6 mg total) by mouth daily.  100 tablet  0  . Ibuprofen (ADVIL) 200 MG CAPS Take 1 capsule by mouth as needed.      . metFORMIN (GLUCOPHAGE) 1000 MG tablet Take 1 tablet (1,000 mg total) by mouth 2 (two) times daily with a meal.  180 tablet  1  . metoprolol (LOPRESSOR) 50 MG tablet TAKE 1 TABLET (50 MG TOTAL) BY MOUTH 2 (TWO) TIMES DAILY.  180 tablet  1  . traMADol (ULTRAM) 50 MG tablet TAKE 1 TABLET (50 MG TOTAL) BY MOUTH EVERY 6 (SIX) HOURS   ASNEEDEDFOR   PAIN. MAXIMUM DOSE= 8 TABLETS PER  30 tablet  0  . lisinopril (PRINIVIL,ZESTRIL) 20 MG tablet Take 1 tablet (20 mg total) by mouth daily.  90 tablet  4  . nitroGLYCERIN (NITROSTAT) 0.4 MG SL tablet Place 0.4 mg under the tongue every 5 (five) minutes as needed.          BP 120/88  Pulse  79  Temp 98.5 F (36.9 C) (Oral)  Wt 266 lb (120.657 kg)  SpO2 98%       Review of Systems  Constitutional: Negative for fever, chills, appetite change and fatigue.  HENT: Negative for hearing loss, ear pain, congestion, sore throat, trouble swallowing, neck stiffness, dental problem, voice change and tinnitus.   Eyes: Negative for pain, discharge and visual disturbance.  Respiratory: Negative for cough, chest tightness, wheezing and stridor.   Cardiovascular: Negative for chest pain, palpitations and leg swelling.  Gastrointestinal: Negative for nausea, vomiting, abdominal pain, diarrhea, constipation, blood in stool and abdominal distention.  Genitourinary: Negative for urgency, hematuria, flank pain, discharge, difficulty urinating and genital sores.  Musculoskeletal: Negative for myalgias, back pain, joint swelling, arthralgias and gait problem.  Skin: Negative for rash.  Neurological: Negative for dizziness, syncope, speech difficulty, weakness, numbness and headaches.    Hematological: Negative for adenopathy. Does not bruise/bleed easily.  Psychiatric/Behavioral: Negative for behavioral problems and dysphoric mood. The patient is not nervous/anxious.        Objective:   Physical Exam  Constitutional: He is oriented to person, place, and time. He appears well-developed.  HENT:  Head: Normocephalic.  Right Ear: External ear normal.  Left Ear: External ear normal.  Eyes: Conjunctivae and EOM are normal.  Neck: Normal range of motion.  Cardiovascular: Normal rate and normal heart sounds.   Pulmonary/Chest: Breath sounds normal.  Abdominal: Soft. Bowel sounds are normal. He exhibits mass.       Sternotomy and upper abdominal midline scar  Firm round mass 10-12 centimeters in diameter just below the mellitus in the midline  Musculoskeletal: Normal range of motion. He exhibits no edema and no tenderness.  Neurological: He is alert and oriented to person, place, and time.  Psychiatric: He has a normal mood and affect. His behavior is normal.          Assessment & Plan:   Abdominal mass. Probable a midline hernia rule out other pathology We'll proceed with abdominal CT scan.  Return as scheduled in 2 months for followup

## 2012-06-28 NOTE — Patient Instructions (Signed)
Abdominal CT scan as discussed  Call or return to clinic prn if these symptoms worsen or fail to improve as anticipated.  

## 2012-07-03 ENCOUNTER — Other Ambulatory Visit: Payer: Self-pay | Admitting: Internal Medicine

## 2012-07-03 ENCOUNTER — Other Ambulatory Visit (INDEPENDENT_AMBULATORY_CARE_PROVIDER_SITE_OTHER): Payer: BC Managed Care – PPO

## 2012-07-03 ENCOUNTER — Other Ambulatory Visit: Payer: BC Managed Care – PPO

## 2012-07-03 DIAGNOSIS — R7302 Impaired glucose tolerance (oral): Secondary | ICD-10-CM

## 2012-07-03 DIAGNOSIS — R7309 Other abnormal glucose: Secondary | ICD-10-CM

## 2012-07-03 LAB — BASIC METABOLIC PANEL
Calcium: 9.5 mg/dL (ref 8.4–10.5)
Chloride: 107 mEq/L (ref 96–112)
Creatinine, Ser: 1.1 mg/dL (ref 0.4–1.5)
Sodium: 137 mEq/L (ref 135–145)

## 2012-07-05 ENCOUNTER — Ambulatory Visit (INDEPENDENT_AMBULATORY_CARE_PROVIDER_SITE_OTHER)
Admission: RE | Admit: 2012-07-05 | Discharge: 2012-07-05 | Disposition: A | Discharge status: Home / Self Care | Payer: BC Managed Care – PPO | Source: Ambulatory Visit | Attending: Internal Medicine | Admitting: Internal Medicine

## 2012-07-05 DIAGNOSIS — R1900 Intra-abdominal and pelvic swelling, mass and lump, unspecified site: Secondary | ICD-10-CM

## 2012-07-05 MED ORDER — IOHEXOL 300 MG/ML  SOLN
100.0000 mL | Freq: Once | INTRAMUSCULAR | Status: AC | PRN
  Administered 2012-07-05: 100 mL via INTRAVENOUS

## 2012-07-24 ENCOUNTER — Telehealth: Payer: Self-pay | Admitting: Internal Medicine

## 2012-07-24 DIAGNOSIS — Q644 Malformation of urachus: Secondary | ICD-10-CM

## 2012-07-24 NOTE — Telephone Encounter (Signed)
Pts spouse called req to get CT scan results that were done on 07/05/12. Pls call.

## 2012-07-25 ENCOUNTER — Encounter (INDEPENDENT_AMBULATORY_CARE_PROVIDER_SITE_OTHER): Payer: BC Managed Care – PPO | Admitting: General Surgery

## 2012-07-25 NOTE — Telephone Encounter (Signed)
Please advise 

## 2012-07-25 NOTE — Telephone Encounter (Signed)
Order done and nicole informed

## 2012-07-25 NOTE — Telephone Encounter (Signed)
Please schedule an appointment with Gen. surgery this week for evaluation and treatment of an infected Urachal  cyst

## 2012-07-26 ENCOUNTER — Encounter (INDEPENDENT_AMBULATORY_CARE_PROVIDER_SITE_OTHER): Payer: Self-pay | Admitting: Surgery

## 2012-07-26 ENCOUNTER — Ambulatory Visit (INDEPENDENT_AMBULATORY_CARE_PROVIDER_SITE_OTHER): Payer: BC Managed Care – PPO | Admitting: Surgery

## 2012-07-26 VITALS — BP 154/80 | HR 82 | Temp 98.4°F | Ht 71.0 in | Wt 263.2 lb

## 2012-07-26 DIAGNOSIS — Q644 Malformation of urachus: Secondary | ICD-10-CM | POA: Insufficient documentation

## 2012-07-26 NOTE — Patient Instructions (Signed)
Laparotomy Care After Please read the instructions outlined below and refer to this sheet in the next few weeks. These discharge instructions provide you with general information on caring for yourself after you leave the hospital. Your doctor may also give you specific instructions. While your treatment has been planned according to the most current medical practices available, unavoidable complications occasionally occur. If you have any problems or questions after discharge, please call your doctor. HOME CARE INSTRUCTIONS ACTIVITY  Rest as much as possible the first two weeks at home.   Avoid strenuous activity such as heavy lifting (more than 10 pounds), pushing or pulling. Limit stair climbing to once or twice a day for the first week, then slowly increase this activity.   Take frequent rest periods throughout the day.   Talk with your doctor about when you may resume your usual physical activity.   Patients need to be out of bed and walking as much as possible. This decreases the chance of:   Blood clots.   Pneumonia.  NUTRITION  You can resume your normal diet once you regain bowel function.   Drink plenty of fluids (6-8 glasses a day or as instructed).   Eat a well-balanced diet.   Daily portions of food from the meat (protein), milk, vegetable, and bread groups are necessary for your health.  ELIMINATION It is very important not to strain during bowel movements. If constipation should occur, you may:  Take a mild laxative (such as Milk of Magnesia)   Add fruit and bran to your diet   Drink more fluids  HYGIENE  You may wash your hair.   Take showers not baths until 4-6 weeks after surgery.   If your incision is closed, you may take a shower or tub bath.  FEVER If you feel feverish or have shaking chills, take your temperature. If it is 102 F (38.9 C), call your doctor. The fever may mean there is an infection. PAIN CONTROL  Mild discomfort may occur.    Only take over-the-counter or prescription medicines for pain, discomfort, or fever as directed by your caregiver.   If a prescription was given, please follow your doctor's directions.   If pain is not relieved or becomes more severe, notify your doctor.  INCISION CARE  Keep your incision site clean with soap and water.   Do not use a dressing unless your cut (incision) from surgery is draining or irritated.   If you have small adhesive strips in place and they do not fall off within 10 days, carefully peel them off.   Check your incision and surrounding area daily for any redness, swelling, discoloration, heavy drainage or separation of the skin. If any of these are present, notify your doctor.  SEXUAL INTERCOURSE Do not have sexual intercourse until your follow-up appointment, unless your doctor tells you otherwise. SEEK MEDICAL CARE IF:   You are unable to tolerate food/drinks.   You are unable to pass gas or have a bowel movement.  Document Released: 06/06/2004 Document Revised: 10/12/2011 Document Reviewed: 10/22/2007 [facility] Patient Information 2012 [place], [place].

## 2012-07-26 NOTE — Progress Notes (Signed)
Chief Complaint:  Mass in lower abdomen x 3 months  History of Present Illness:  [name] is an 66 y.o. male presents for evaluation of an infraumbilical mass.  He states he has noted this abou 2-3 months. In years past he has. Had repeat bouts of a. He was seen by Eleonore Chiquito who obtained a CT scan.This showed a infected urachal cyst .  I reviewed this with Dr. Ezzard Standing in we think that this is separate from the colon. I recommend a colon prep however and then laparoscopy to define this with likely expiration from the midline to excise this urachal tissue. He is aware of the nature of this operation and will be laparoscopic and open and I can give him as much detail about it since he's or unusual lesions that unencountered usually in younger people with draining from their umbilicus.    Past Medical History  Diagnosis Date  . CAD, ARTERY BYPASS GRAFT 02/10/2008  . CAROTID ARTERY STENOSIS, WITHOUT INFARCTION 09/22/2008  . CORONARY ARTERY DISEASE 04/03/2008  . ERECTILE DYSFUNCTION 01/14/2009  . GOUT 01/16/2008  . HERPES ZOSTER 04/15/2010  . HYPERLIPIDEMIA 01/16/2008  . HYPERTENSION 01/16/2008  . IMPAIRED GLUCOSE TOLERANCE 08/22/2010  . Ulcer   . Diabetes mellitus     Past Surgical History  Procedure Date  . Partial gastrectomy 1974    bleeding ulcers  . Coronary artery bypass graft 2009    vessels x3    Current Outpatient Prescriptions  Medication Sig Dispense Refill  . allopurinol (ZYLOPRIM) 100 MG tablet Take 1 tablet (100 mg total) by mouth daily.  90 tablet  3  . amLODipine (NORVASC) 10 MG tablet Take 1 tablet (10 mg total) by mouth daily.  90 tablet  3  . aspirin 325 MG tablet Take 325 mg by mouth daily.        Marland Kitchen atorvastatin (LIPITOR) 40 MG tablet Take 1 tablet (40 mg total) by mouth daily.  90 tablet  3  . Cholecalciferol (VITAMIN D3) 1000 UNITS CAPS Take 1 capsule by mouth daily.      . colchicine 0.6 MG tablet Take 1 tablet (0.6 mg total) by mouth daily.  100 tablet  0    . Ibuprofen (ADVIL) 200 MG CAPS Take 1 capsule by mouth as needed.      Marland Kitchen lisinopril (PRINIVIL,ZESTRIL) 20 MG tablet Take 1 tablet (20 mg total) by mouth daily.  90 tablet  4  . metFORMIN (GLUCOPHAGE) 1000 MG tablet Take 1 tablet (1,000 mg total) by mouth 2 (two) times daily with a meal.  180 tablet  1  . metoprolol (LOPRESSOR) 50 MG tablet TAKE 1 TABLET (50 MG TOTAL) BY MOUTH 2 (TWO) TIMES DAILY.  180 tablet  1  . nitroGLYCERIN (NITROSTAT) 0.4 MG SL tablet Place 0.4 mg under the tongue every 5 (five) minutes as needed.        . traMADol (ULTRAM) 50 MG tablet TAKE 1 TABLET (50 MG TOTAL) BY MOUTH EVERY 6 (SIX) HOURS   ASNEEDEDFOR   PAIN. MAXIMUM DOSE= 8 TABLETS PER  30 tablet  0   Review of patient's allergies indicates no known allergies. Family History  Problem Relation Age of Onset  . Heart disease Mother   . Heart disease Sister   . Colon cancer Neg Hx    Social History:   reports that he quit smoking about 13 years ago. He has never used smokeless tobacco. He reports that he drinks about 21 ounces of alcohol per week.  He reports that he does not use illicit drugs.   REVIEW OF SYSTEMS - PERTINENT POSITIVES ONLY: negative  Physical Exam:   Blood pressure 154/80, pulse 82, temperature 98.4 F (36.9 C), temperature source Temporal, height 5\' 11"  (1.803 m), weight 263 lb 3.2 oz (119.387 kg), SpO2 98.00%. Body mass index is 36.71 kg/(m^2).  Gen:  WDWN AAM NAD  Neurological: Alert and oriented to person, place, and time. Motor and sensory function is grossly intact  Head: Normocephalic and atraumatic.  Eyes: Conjunctivae are normal. Pupils are equal, round, and reactive to light. No scleral icterus.  Neck: Normal range of motion. Neck supple. No tracheal deviation or thyromegaly present.  Cardiovascular:  SR without murmurs or gallops.  No carotid bruits Respiratory: Effort normal.  No respiratory distress. No chest wall tenderness. Breath sounds normal.  No wheezes, rales or rhonchi.   Abdomen:  Firm palpable mass that is below the umbilicus and in the midline.  Correlated with CT scan.  Non tender GU: Musculoskeletal: Normal range of motion. Extremities are nontender. No cyanosis, edema or clubbing noted Lymphadenopathy: No cervical, preauricular, postauricular or axillary adenopathy is present Skin: Skin is warm and dry. No rash noted. No diaphoresis. No erythema. No pallor. Pscyh: Normal mood and affect. Behavior is normal. Judgment and thought content normal.   LABORATORY RESULTS: No results found for this or any previous visit (from the past 48 hour(s)).  RADIOLOGY RESULTS: No results found.  Problem List: Patient Active Problem List  Diagnosis  . HERPES ZOSTER  . IMPAIRED GLUCOSE TOLERANCE  . HYPERLIPIDEMIA  . GOUT  . ERECTILE DYSFUNCTION  . HYPERTENSION  . CORONARY ARTERY DISEASE  . CAD, ARTERY BYPASS GRAFT  . CAROTID ARTERY STENOSIS, WITHOUT INFARCTION  . 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 Daphine Deutscher, MD, [facility] [phone] beeper [phone]  07/26/2012 3:23 PM

## 2012-08-08 NOTE — Telephone Encounter (Signed)
Error

## 2012-08-19 ENCOUNTER — Encounter (HOSPITAL_COMMUNITY): Payer: Self-pay | Admitting: Pharmacy Technician

## 2012-08-20 ENCOUNTER — Telehealth: Payer: Self-pay | Admitting: Internal Medicine

## 2012-08-20 NOTE — Patient Instructions (Addendum)
20 Marion J Whitham  08/20/2012   Your procedure is scheduled on:  08-27-2012   Report to [facility] at 0710  AM.  Call this number if you have problems the morning of surgery: [phone]   Remember:fleets enema before bedtime 08-26-2012   Do not eat food or drink liquids:After Midnight. Follow up with dr Daphine Deutscher office concerning any bowel prep beofre surgery [phone].  .  Take these medicines the morning of surgery with A SIP OF WATER: norvasc, lipitor, metorpolol   Do not wear jewelry or make up.  Do not wear lotions, powders, or perfumes. You may wear deodorant.    Do not bring valuables to the hospital.  Contacts, dentures or bridgework may not be worn into surgery.  Leave suitcase in the car. After surgery it may be brought to your room.  For patients admitted to the hospital, checkout time is 11:00 AM the day of discharge                             Patients discharged the day of surgery will not be allowed to drive home. If going home same day of surgery, you must have someone stay with you the first 24 hours at home and arrange for some one to drive you home from hospital.    Special Instructions: See [facility] instruction sheet. Women do not shave legs or underarms for 12 hours before showers. Men may shave face morning of surgery.    Please read over the following fact sheets that you were given: MRSA Information  [facility] pre op nurse phone number [phone], call if needed

## 2012-08-20 NOTE — Telephone Encounter (Signed)
[facility] Triage Call Report Triage Record Num: [id number] Operator: Frederico Hamman Patient Name: [name] Call Date & Time: 08/19/2012 10:18:45PM Patient Phone: [phone] PCP: Gordy Savers Patient Gender: Male PCP Fax : [phone] Patient DOB: [birth year] Practice Name: [facility]  Reason for Call:  Wife /Carolyn states [name] is scheduled for surgery on 08/27/12 to remove an abdominal cyst. Had onset of headache on 22:00. Rates headache a 5 on 1/10 pt scale. Oral Temp 99.7 @ 2200. Onset of fever on 10/13. Onset of chills at 22:00. Oral temp at 22:25 is 100.2. States he feels Flu like symptoms. Has pre admit visit on 08/21/12. Has not had influenza vaccine. Per flu like symptoms protocol has call provider within 4 hrs disposition due to any temperature elevation in a frail elderly person. Dr. Lovell Sheehan notified. Verbal order given for Keflex 500mg  po QID x 7 days. Fill quantity sufficient with no refills. Prescription called in and left on voicemail at [facility] [phone]. Did not want prescription called in to pharmacy open now. Advised to F/U with office. Advised to notify surgeon. Care advice given.

## 2012-08-21 ENCOUNTER — Encounter (HOSPITAL_COMMUNITY): Payer: Self-pay

## 2012-08-21 ENCOUNTER — Encounter (HOSPITAL_COMMUNITY)
Admission: RE | Admit: 2012-08-21 | Discharge: 2012-08-21 | Disposition: A | Discharge status: Home / Self Care | Payer: BC Managed Care – PPO | Source: Ambulatory Visit | Attending: Surgery | Admitting: Surgery

## 2012-08-21 ENCOUNTER — Ambulatory Visit (HOSPITAL_COMMUNITY)
Admission: RE | Admit: 2012-08-21 | Discharge: 2012-08-21 | Disposition: A | Discharge status: Home / Self Care | Payer: BC Managed Care – PPO | Source: Ambulatory Visit | Attending: Surgery | Admitting: Surgery

## 2012-08-21 DIAGNOSIS — Z01810 Encounter for preprocedural cardiovascular examination: Secondary | ICD-10-CM | POA: Insufficient documentation

## 2012-08-21 DIAGNOSIS — Z87891 Personal history of nicotine dependence: Secondary | ICD-10-CM | POA: Insufficient documentation

## 2012-08-21 DIAGNOSIS — Z01812 Encounter for preprocedural laboratory examination: Secondary | ICD-10-CM | POA: Insufficient documentation

## 2012-08-21 DIAGNOSIS — I10 Essential (primary) hypertension: Secondary | ICD-10-CM | POA: Insufficient documentation

## 2012-08-21 DIAGNOSIS — Z01818 Encounter for other preprocedural examination: Secondary | ICD-10-CM | POA: Insufficient documentation

## 2012-08-21 DIAGNOSIS — Z951 Presence of aortocoronary bypass graft: Secondary | ICD-10-CM | POA: Insufficient documentation

## 2012-08-21 DIAGNOSIS — I2510 Atherosclerotic heart disease of native coronary artery without angina pectoris: Secondary | ICD-10-CM | POA: Insufficient documentation

## 2012-08-21 DIAGNOSIS — J984 Other disorders of lung: Secondary | ICD-10-CM | POA: Insufficient documentation

## 2012-08-21 HISTORY — DX: Zoster without complications: B02.9

## 2012-08-21 LAB — CBC
HCT: 30.8 % — ABNORMAL LOW (ref 39.0–52.0)
MCV: 83.2 fL (ref 78.0–100.0)
Platelets: 314 10*3/uL (ref 150–400)

## 2012-08-21 LAB — SURGICAL PCR SCREEN
MRSA, PCR: NEGATIVE
Staphylococcus aureus: NEGATIVE

## 2012-08-21 LAB — BASIC METABOLIC PANEL
BUN: 21 mg/dL (ref 6–23)
CO2: 25 mEq/L (ref 19–32)
Chloride: 99 mEq/L (ref 96–112)
Creatinine, Ser: 1.23 mg/dL (ref 0.50–1.35)

## 2012-08-21 NOTE — Progress Notes (Signed)
Pt started on keflex by dr Amador Cunas 08-19-2012 for temp 100.2 and chills, pt tp notify dr Daphine Deutscher if temp above 100.0 and follow up with dr Daphine Deutscher to see if bowel preop needed for surgery.

## 2012-08-21 NOTE — Progress Notes (Signed)
08/21/12 1346  OBSTRUCTIVE SLEEP APNEA  Score 4 or greater  Results sent to PCP

## 2012-08-22 ENCOUNTER — Telehealth (INDEPENDENT_AMBULATORY_CARE_PROVIDER_SITE_OTHER): Payer: Self-pay | Admitting: General Surgery

## 2012-08-22 NOTE — Telephone Encounter (Signed)
Returned the patient phone call and spoke with his wife.  They had questions about the bowel prep so I gave them those instructions again.  She also wanted to inform me that he husband has been running a fever sinceTuesday.  They called their PCP and he prescribed cephalexin.  I informed her that I would make Dr. Daphine Deutscher aware of this in case he thinks we need to postpone the surgery.

## 2012-08-23 ENCOUNTER — Telehealth (INDEPENDENT_AMBULATORY_CARE_PROVIDER_SITE_OTHER): Payer: Self-pay | Admitting: General Surgery

## 2012-08-23 NOTE — Telephone Encounter (Signed)
Spoke with patient and let him know that we will still plan on having his surgery on the 22.  Dr. Daphine Deutscher is aware of him being put on the cephalexin for the fever.

## 2012-08-25 ENCOUNTER — Encounter (HOSPITAL_COMMUNITY): Payer: Self-pay

## 2012-08-25 ENCOUNTER — Encounter (HOSPITAL_COMMUNITY): Admission: EM | Disposition: A | Discharge status: Home / Self Care | Payer: Self-pay | Source: Ambulatory Visit

## 2012-08-25 ENCOUNTER — Emergency Department (HOSPITAL_COMMUNITY): Payer: BC Managed Care – PPO

## 2012-08-25 ENCOUNTER — Emergency Department (HOSPITAL_COMMUNITY): Payer: BC Managed Care – PPO | Admitting: Anesthesiology

## 2012-08-25 ENCOUNTER — Encounter (HOSPITAL_COMMUNITY): Payer: Self-pay | Admitting: Anesthesiology

## 2012-08-25 ENCOUNTER — Inpatient Hospital Stay (HOSPITAL_COMMUNITY)
Admission: EM | Admit: 2012-08-25 | Discharge: 2012-08-28 | DRG: 278 | Disposition: A | Discharge status: Home / Self Care | Payer: BC Managed Care – PPO | Source: Ambulatory Visit | Attending: Surgery | Admitting: Surgery

## 2012-08-25 DIAGNOSIS — I10 Essential (primary) hypertension: Secondary | ICD-10-CM | POA: Diagnosis present

## 2012-08-25 DIAGNOSIS — L03319 Cellulitis of trunk, unspecified: Principal | ICD-10-CM | POA: Diagnosis present

## 2012-08-25 DIAGNOSIS — E119 Type 2 diabetes mellitus without complications: Secondary | ICD-10-CM | POA: Diagnosis present

## 2012-08-25 DIAGNOSIS — Q644 Malformation of urachus: Secondary | ICD-10-CM

## 2012-08-25 DIAGNOSIS — Z6837 Body mass index (BMI) 37.0-37.9, adult: Secondary | ICD-10-CM

## 2012-08-25 DIAGNOSIS — I2510 Atherosclerotic heart disease of native coronary artery without angina pectoris: Secondary | ICD-10-CM | POA: Diagnosis present

## 2012-08-25 DIAGNOSIS — Z951 Presence of aortocoronary bypass graft: Secondary | ICD-10-CM

## 2012-08-25 DIAGNOSIS — L02219 Cutaneous abscess of trunk, unspecified: Principal | ICD-10-CM | POA: Diagnosis present

## 2012-08-25 DIAGNOSIS — L0291 Cutaneous abscess, unspecified: Secondary | ICD-10-CM

## 2012-08-25 DIAGNOSIS — Z87891 Personal history of nicotine dependence: Secondary | ICD-10-CM

## 2012-08-25 HISTORY — PX: LAPAROTOMY: SHX154

## 2012-08-25 LAB — COMPREHENSIVE METABOLIC PANEL
BUN: 22 mg/dL (ref 6–23)
CO2: 24 mEq/L (ref 19–32)
Calcium: 9.6 mg/dL (ref 8.4–10.5)
Creatinine, Ser: 1.16 mg/dL (ref 0.50–1.35)
GFR calc Af Amer: 75 mL/min — ABNORMAL LOW (ref >90)
GFR calc non Af Amer: 65 mL/min — ABNORMAL LOW (ref >90)
Glucose, Bld: 156 mg/dL — ABNORMAL HIGH (ref 70–99)
Sodium: 132 mEq/L — ABNORMAL LOW (ref 135–145)
Total Protein: 8 g/dL (ref 6.0–8.3)

## 2012-08-25 LAB — URINALYSIS, ROUTINE W REFLEX MICROSCOPIC
Nitrite: NEGATIVE
Protein, ur: NEGATIVE mg/dL
Specific Gravity, Urine: 1.021 (ref 1.005–1.030)
Urobilinogen, UA: 1 mg/dL (ref 0.0–1.0)

## 2012-08-25 LAB — CBC WITH DIFFERENTIAL/PLATELET
Eosinophils Absolute: 0.2 10*3/uL (ref 0.0–0.7)
Eosinophils Relative: 2 % (ref 0–5)
HCT: 31.9 % — ABNORMAL LOW (ref 39.0–52.0)
Lymphs Abs: 1.7 10*3/uL (ref 0.7–4.0)
MCH: 27.9 pg (ref 26.0–34.0)
MCV: 82.4 fL (ref 78.0–100.0)
Monocytes Absolute: 1.3 10*3/uL — ABNORMAL HIGH (ref 0.1–1.0)
Monocytes Relative: 10 % (ref 3–12)
Platelets: 410 10*3/uL — ABNORMAL HIGH (ref 150–400)
RBC: 3.87 MIL/uL — ABNORMAL LOW (ref 4.22–5.81)

## 2012-08-25 LAB — LACTIC ACID, PLASMA: Lactic Acid, Venous: 0.9 mmol/L (ref 0.5–2.2)

## 2012-08-25 SURGERY — LAPAROTOMY, EXPLORATORY
Anesthesia: General | Site: Abdomen | Wound class: Dirty or Infected

## 2012-08-25 MED ORDER — LABETALOL HCL 5 MG/ML IV SOLN
INTRAVENOUS | Status: DC | PRN
  Administered 2012-08-25: 2.5 mg via INTRAVENOUS

## 2012-08-25 MED ORDER — SODIUM CHLORIDE 0.9 % IV SOLN
1000.0000 mL | INTRAVENOUS | Status: DC
  Administered 2012-08-25: 1000 mL via INTRAVENOUS

## 2012-08-25 MED ORDER — LACTATED RINGERS IV SOLN: INTRAVENOUS | Status: DC

## 2012-08-25 MED ORDER — ONDANSETRON 4 MG PO TBDP
4.0000 mg | ORAL_TABLET | Freq: Once | ORAL | Status: AC
  Administered 2012-08-25: 4 mg via ORAL
  Filled 2012-08-25: qty 1

## 2012-08-25 MED ORDER — VANCOMYCIN HCL IN DEXTROSE 1-5 GM/200ML-% IV SOLN: 1000.0000 mg | Freq: Once | INTRAVENOUS | Status: DC

## 2012-08-25 MED ORDER — IOHEXOL 300 MG/ML  SOLN
100.0000 mL | Freq: Once | INTRAMUSCULAR | Status: AC | PRN
  Administered 2012-08-25: 100 mL via INTRAVENOUS

## 2012-08-25 MED ORDER — 0.9 % SODIUM CHLORIDE (POUR BTL) OPTIME
TOPICAL | Status: DC | PRN
  Administered 2012-08-25: 2000 mL

## 2012-08-25 MED ORDER — PIPERACILLIN-TAZOBACTAM 3.375 G IVPB 30 MIN
3.3750 g | Freq: Once | INTRAVENOUS | Status: AC
  Administered 2012-08-25: 3.375 g via INTRAVENOUS
  Filled 2012-08-25: qty 50

## 2012-08-25 MED ORDER — HYDROMORPHONE HCL PF 1 MG/ML IJ SOLN
INTRAMUSCULAR | Status: AC
  Filled 2012-08-25: qty 1

## 2012-08-25 MED ORDER — HYDROMORPHONE HCL PF 1 MG/ML IJ SOLN
0.2500 mg | INTRAMUSCULAR | Status: DC | PRN
  Administered 2012-08-25 – 2012-08-26 (×3): 0.5 mg via INTRAVENOUS

## 2012-08-25 MED ORDER — MIDAZOLAM HCL 5 MG/5ML IJ SOLN
INTRAMUSCULAR | Status: DC | PRN
  Administered 2012-08-25: 1 mg via INTRAVENOUS

## 2012-08-25 MED ORDER — PROPOFOL 10 MG/ML IV BOLUS
INTRAVENOUS | Status: DC | PRN
  Administered 2012-08-25: 150 mg via INTRAVENOUS

## 2012-08-25 MED ORDER — DIPHENHYDRAMINE HCL 25 MG PO CAPS
50.0000 mg | ORAL_CAPSULE | Freq: Once | ORAL | Status: DC
  Filled 2012-08-25: qty 2

## 2012-08-25 MED ORDER — PIPERACILLIN-TAZOBACTAM 3.375 G IVPB 30 MIN
3.3750 g | Freq: Three times a day (TID) | INTRAVENOUS | Status: DC
  Administered 2012-08-26 (×2): 3.375 g via INTRAVENOUS
  Filled 2012-08-25 (×3): qty 50

## 2012-08-25 MED ORDER — SODIUM CHLORIDE 0.9 % IV SOLN
1000.0000 mL | Freq: Once | INTRAVENOUS | Status: AC
  Administered 2012-08-25: 1000 mL via INTRAVENOUS

## 2012-08-25 MED ORDER — POTASSIUM CHLORIDE IN NACL 20-0.9 MEQ/L-% IV SOLN
INTRAVENOUS | Status: DC
  Administered 2012-08-26 – 2012-08-27 (×4): via INTRAVENOUS
  Filled 2012-08-25 (×7): qty 1000

## 2012-08-25 MED ORDER — LACTATED RINGERS IV SOLN
INTRAVENOUS | Status: DC | PRN
  Administered 2012-08-25: 23:00:00 via INTRAVENOUS

## 2012-08-25 MED ORDER — VANCOMYCIN HCL 1000 MG IV SOLR
1250.0000 mg | Freq: Two times a day (BID) | INTRAVENOUS | Status: DC
  Filled 2012-08-25: qty 1250

## 2012-08-25 MED ORDER — FENTANYL CITRATE 0.05 MG/ML IJ SOLN
INTRAMUSCULAR | Status: DC | PRN
  Administered 2012-08-25: 50 ug via INTRAVENOUS
  Administered 2012-08-25: 100 ug via INTRAVENOUS

## 2012-08-25 MED ORDER — VANCOMYCIN HCL 1000 MG IV SOLR
2000.0000 mg | Freq: Once | INTRAVENOUS | Status: AC
  Administered 2012-08-25: 2000 mg via INTRAVENOUS
  Filled 2012-08-25: qty 2000

## 2012-08-25 MED ORDER — PROMETHAZINE HCL 25 MG/ML IJ SOLN: 6.2500 mg | INTRAMUSCULAR | Status: DC | PRN

## 2012-08-25 MED ORDER — VANCOMYCIN HCL 1000 MG IV SOLR: 1250.0000 mg | Freq: Two times a day (BID) | INTRAVENOUS | Status: DC

## 2012-08-25 MED ORDER — HYDROMORPHONE HCL PF 1 MG/ML IJ SOLN
1.0000 mg | Freq: Once | INTRAMUSCULAR | Status: AC
  Administered 2012-08-25: 1 mg via INTRAVENOUS
  Filled 2012-08-25: qty 1

## 2012-08-25 MED ORDER — SODIUM CHLORIDE 0.9 % IV SOLN
INTRAVENOUS | Status: DC | PRN
  Administered 2012-08-25: 22:00:00 via INTRAVENOUS

## 2012-08-25 MED ORDER — METHYLPREDNISOLONE SODIUM SUCC 125 MG IJ SOLR
125.0000 mg | Freq: Once | INTRAMUSCULAR | Status: AC
  Administered 2012-08-25: 125 mg via INTRAVENOUS
  Filled 2012-08-25: qty 2

## 2012-08-25 MED ORDER — DIPHENHYDRAMINE HCL 50 MG/ML IJ SOLN
50.0000 mg | Freq: Once | INTRAMUSCULAR | Status: AC
  Administered 2012-08-25: 50 mg via INTRAVENOUS
  Filled 2012-08-25: qty 1

## 2012-08-25 MED ORDER — PIPERACILLIN-TAZOBACTAM 3.375 G IVPB 30 MIN: 3.3750 g | Freq: Once | INTRAVENOUS | Status: DC

## 2012-08-25 MED ORDER — SUCCINYLCHOLINE CHLORIDE 20 MG/ML IJ SOLN
INTRAMUSCULAR | Status: DC | PRN
  Administered 2012-08-25: 100 mg via INTRAVENOUS

## 2012-08-25 SURGICAL SUPPLY — 43 items
APPLICATOR COTTON TIP 6IN STRL (MISCELLANEOUS) IMPLANT
BLADE EXTENDED COATED 6.5IN (ELECTRODE) IMPLANT
BLADE HEX COATED 2.75 (ELECTRODE) ×2 IMPLANT
CANISTER SUCTION 2500CC (MISCELLANEOUS) ×2 IMPLANT
CHLORAPREP W/TINT 26ML (MISCELLANEOUS) IMPLANT
CLOTH BEACON ORANGE TIMEOUT ST (SAFETY) ×2 IMPLANT
COVER MAYO STAND STRL (DRAPES) IMPLANT
DRAPE LAPAROSCOPIC ABDOMINAL (DRAPES) ×2 IMPLANT
DRAPE WARM FLUID 44X44 (DRAPE) ×2 IMPLANT
DRSG PAD ABDOMINAL 8X10 ST (GAUZE/BANDAGES/DRESSINGS) ×2 IMPLANT
ELECT REM PT RETURN 9FT ADLT (ELECTROSURGICAL) ×2
ELECTRODE REM PT RTRN 9FT ADLT (ELECTROSURGICAL) ×1 IMPLANT
GLOVE BIO SURGEON STRL SZ7 (GLOVE) ×2 IMPLANT
GLOVE BIOGEL PI IND STRL 7.0 (GLOVE) ×1 IMPLANT
GLOVE BIOGEL PI INDICATOR 7.0 (GLOVE) ×1
GLOVE SURG SS PI 7.5 STRL IVOR (GLOVE) ×4 IMPLANT
GOWN PREVENTION PLUS LG XLONG (DISPOSABLE) ×2 IMPLANT
GOWN STRL NON-REIN LRG LVL3 (GOWN DISPOSABLE) ×2 IMPLANT
GOWN STRL REIN XL XLG (GOWN DISPOSABLE) ×4 IMPLANT
KIT BASIN OR (CUSTOM PROCEDURE TRAY) ×2 IMPLANT
LIGASURE IMPACT 36 18CM CVD LR (INSTRUMENTS) IMPLANT
NS IRRIG 1000ML POUR BTL (IV SOLUTION) ×4 IMPLANT
PACK GENERAL/GYN (CUSTOM PROCEDURE TRAY) ×2 IMPLANT
SCALPEL HARMONIC ACE (MISCELLANEOUS) IMPLANT
SHEARS FOC LG CVD HARMONIC 17C (MISCELLANEOUS) IMPLANT
SPONGE GAUZE 4X4 12PLY (GAUZE/BANDAGES/DRESSINGS) ×2 IMPLANT
SPONGE LAP 18X18 X RAY DECT (DISPOSABLE) IMPLANT
STAPLER VISISTAT 35W (STAPLE) ×2 IMPLANT
SUCTION POOLE TIP (SUCTIONS) ×2 IMPLANT
SUT PDS AB 1 CTX 36 (SUTURE) IMPLANT
SUT PDS AB 1 TP1 96 (SUTURE) IMPLANT
SUT SILK 2 0 (SUTURE) ×1
SUT SILK 2 0 SH CR/8 (SUTURE) IMPLANT
SUT SILK 2-0 18XBRD TIE 12 (SUTURE) ×1 IMPLANT
SUT SILK 3 0 (SUTURE)
SUT SILK 3 0 SH CR/8 (SUTURE) IMPLANT
SUT SILK 3-0 18XBRD TIE 12 (SUTURE) IMPLANT
SWAB COLLECTION DEVICE MRSA (MISCELLANEOUS) ×2 IMPLANT
TAPE CLOTH SURG 6X10 WHT LF (GAUZE/BANDAGES/DRESSINGS) ×2 IMPLANT
TOWEL OR 17X26 10 PK STRL BLUE (TOWEL DISPOSABLE) ×2 IMPLANT
TRAY FOLEY CATH 14FRSI W/METER (CATHETERS) ×2 IMPLANT
TUBE ANAEROBIC SPECIMEN COL (MISCELLANEOUS) ×2 IMPLANT
YANKAUER SUCT BULB TIP NO VENT (SUCTIONS) ×2 IMPLANT

## 2012-08-25 NOTE — ED Notes (Addendum)
[name], NT informed me that pt had itching and redness in left lower arm where Vancomycin was infusing. Pt had itching and hives on left lower arm, denied SOB/throat closing, and had clear lung on inspection. Vancomycin stopped. PA notified.

## 2012-08-25 NOTE — ED Notes (Signed)
Notified RN,Conrad made aware of allergic reaction on patients left arm.

## 2012-08-25 NOTE — Anesthesia Postprocedure Evaluation (Signed)
  Anesthesia Post-op Note  Patient: [name]  Procedure(s) Performed: Procedure(s) (LRB) with comments: EXPLORATORY LAPAROTOMY (N/A) - incision and drainage abdominal wall abcessand intra abdominal wall abcess  Patient Location: PACU  Anesthesia Type: General  Level of Consciousness: awake, alert , oriented and patient cooperative  Airway and Oxygen Therapy: Patient Spontanous Breathing and Patient connected to face mask oxygen  Post-op Pain: none  Post-op Assessment: Post-op Vital signs reviewed and Patient's Cardiovascular Status Stable  Post-op Vital Signs: Reviewed and stable  Complications: No apparent anesthesia complications

## 2012-08-25 NOTE — Preoperative (Signed)
Beta Blockers   Reason not to administer Beta Blockers:  Administration of Beta Blocker pending anesthesia induction and blood pressure following induction and intubation.

## 2012-08-25 NOTE — Transfer of Care (Signed)
Immediate Anesthesia Transfer of Care Note  Patient: [name]  Procedure(s) Performed: Procedure(s) (LRB) with comments: EXPLORATORY LAPAROTOMY (N/A) - incision and drainage abdominal wall abcessand intra abdominal wall abcess  Patient Location: PACU  Anesthesia Type: General  Level of Consciousness: awake, alert , sedated and patient cooperative  Airway & Oxygen Therapy: Patient Spontanous Breathing and Patient connected to face mask oxygen  Post-op Assessment: Report given to PACU RN and Post -op Vital signs reviewed and stable  Post vital signs: Reviewed and stable  Complications: No apparent anesthesia complications

## 2012-08-25 NOTE — ED Notes (Signed)
Pt presents with NAD- Pt presents with lower abdominal bleeding from abscess.  Pending surgery Tuesday morning with Daphine Deutscher. Pt is on antibiotics- Pt bleeding small amount of blood on ABD pad.

## 2012-08-25 NOTE — Brief Op Note (Signed)
08/25/2012  11:00 PM  PATIENT:  [name]  66 y.o. male  PRE-OPERATIVE DIAGNOSIS:  abdominal wall abcess  POST-OPERATIVE DIAGNOSIS:  abdominal wall abcess  PROCEDURE:  Procedure(s) (LRB) with comments: EXPLORATORY LAPAROTOMY (N/A) - incision and drainage abdominal wall abcessand intra abdominal wall abcess  SURGEON:  Surgeon(s) and Role:    * Lodema Pilot, DO - Primary  PHYSICIAN ASSISTANT:   ASSISTANTS: none   ANESTHESIA:   general  EBL:  Total I/O In: 300 [I.V.:300] Out: 100 [Blood:100]  BLOOD ADMINISTERED:none  DRAINS: wound packing   LOCAL MEDICATIONS USED:  NONE  SPECIMEN:  Source of Specimen:  cultures  DISPOSITION OF SPECIMEN:  PATHOLOGY  COUNTS:  YES  TOURNIQUET:  * No tourniquets in log *  DICTATION: .Other Dictation: Dictation Number  [id number]  PLAN OF CARE: Admit to inpatient   PATIENT DISPOSITION:  PACU - hemodynamically stable.   Delay start of Pharmacological VTE agent (>24hrs) due to surgical blood loss or risk of bleeding: no

## 2012-08-25 NOTE — H&P (Signed)
[name] is an 66 y.o. male.  HPI: this patient is known to our practice is a patient of Dr. Ermalene Searing. He is actually on the schedule to have excision of urachal cyst in 2 days. 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 Daphine Deutscher for treatment. He says that approximately 2 days ago he began having some drainage and redness in the area of the mass. It has been very little drainage and inset some increasing redness and pain in the area. He had fevers on one occasion but otherwise has not had any other significant symptoms. He is diabetic. He had a CT scan today which demonstrated a 6 cm subcutaneous fluid collection which appears to be connected to this intra-abdominal urachal remnant.  Past Medical History  Diagnosis Date  . CAD, ARTERY BYPASS GRAFT 02/10/2008  . CAROTID ARTERY STENOSIS, WITHOUT INFARCTION 09/22/2008  . CORONARY ARTERY DISEASE 04/03/2008  . ERECTILE DYSFUNCTION 01/14/2009  . GOUT 01/16/2008  . HERPES ZOSTER 04/15/2010  . HYPERLIPIDEMIA 01/16/2008  . HYPERTENSION 01/16/2008  . IMPAIRED GLUCOSE TOLERANCE 08/22/2010  . Diabetes mellitus   . Ulcer 30 yrs ago    stomach  . Shingles 2011    left chest    Past Surgical History  Procedure Date  . Partial gastrectomy 1974    bleeding ulcers  . Coronary artery bypass graft 2009    vessels x3  . Coloscopy 3 months ago    Family History  Problem Relation Age of Onset  . Heart disease Mother   . Heart disease Sister   . Colon cancer Neg Hx     Social History:  reports that he quit smoking about 13 years ago. His smoking use included Cigarettes. He has a 10 pack-year smoking history. He has never used smokeless tobacco. He reports that he drinks about 16.8 ounces of alcohol per week. He reports that he does not use illicit drugs.  Allergies: No Known Allergies  Medications: I have reviewed the patient's current medications. Scheduled Meds:    . diphenhydrAMINE  50 mg Intravenous Once  .  HYDROmorphone (DILAUDID) injection  1 mg Intravenous Once  . methylPREDNISolone (SOLU-MEDROL) injection  125 mg Intravenous Once  . ondansetron  4 mg Oral Once  . DISCONTD: diphenhydrAMINE  50 mg Oral Once  . DISCONTD: vancomycin  1,000 mg Intravenous Once   Continuous Infusions:   . sodium chloride 1,000 mL (08/25/12 1747)   Followed by  . sodium chloride 1,000 mL (08/25/12 1826)   Followed by  . sodium chloride 1,000 mL (08/25/12 1754)  . piperacillin-tazobactam 3.375 g (08/25/12 1746)  . piperacillin-tazobactam    . vancomycin    . vancomycin 2,000 mg (08/25/12 1807)  . DISCONTD: piperacillin-tazobactam    . DISCONTD: vancomycin     PRN Meds:.iohexol   Results for orders placed during the hospital encounter of 08/25/12 (from the past 48 hour(s))  CBC WITH DIFFERENTIAL     Status: Abnormal   Collection Time   08/25/12  5:25 PM      Component Value Range Comment   WBC 13.1 (*) 4.0 - 10.5 K/uL    RBC 3.87 (*) 4.22 - 5.81 MIL/uL    Hemoglobin 10.8 (*) 13.0 - 17.0 g/dL    HCT 16.1 (*) 09.6 - 52.0 %    MCV 82.4  78.0 - 100.0 fL    MCH 27.9  26.0 -  34.0 pg    MCHC 33.9  30.0 - 36.0 g/dL    RDW 16.1 (*) 09.6 - 15.5 %    Platelets 410 (*) 150 - 400 K/uL    Neutrophils Relative 75  43 - 77 %    Neutro Abs 9.8 (*) 1.7 - 7.7 K/uL    Lymphocytes Relative 13  12 - 46 %    Lymphs Abs 1.7  0.7 - 4.0 K/uL    Monocytes Relative 10  3 - 12 %    Monocytes Absolute 1.3 (*) 0.1 - 1.0 K/uL    Eosinophils Relative 2  0 - 5 %    Eosinophils Absolute 0.2  0.0 - 0.7 K/uL    Basophils Relative 0  0 - 1 %    Basophils Absolute 0.0  0.0 - 0.1 K/uL   COMPREHENSIVE METABOLIC PANEL     Status: Abnormal   Collection Time   08/25/12  5:25 PM      Component Value Range Comment   Sodium 132 (*) 135 - 145 mEq/L    Potassium 4.9  3.5 - 5.1 mEq/L    Chloride 99  96 - 112 mEq/L    CO2 24  19 - 32 mEq/L    Glucose, Bld 156 (*) 70 - 99 mg/dL    BUN  22  6 - 23 mg/dL    Creatinine, Ser 0.45  0.50 - 1.35 mg/dL    Calcium 9.6  8.4 - 40.9 mg/dL    Total Protein 8.0  6.0 - 8.3 g/dL    Albumin 2.5 (*) 3.5 - 5.2 g/dL    AST 24  0 - 37 U/L    ALT 18  0 - 53 U/L    Alkaline Phosphatase 82  39 - 117 U/L    Total Bilirubin 0.2 (*) 0.3 - 1.2 mg/dL    GFR calc non Af Amer 65 (*) >90 mL/min    GFR calc Af Amer 75 (*) >90 mL/min   LACTIC ACID, PLASMA     Status: Normal   Collection Time   08/25/12  5:25 PM      Component Value Range Comment   Lactic Acid, Venous 0.9  0.5 - 2.2 mmol/L   URINALYSIS, ROUTINE W REFLEX MICROSCOPIC     Status: Abnormal   Collection Time   08/25/12  5:44 PM      Component Value Range Comment   Color, Urine YELLOW  YELLOW    APPearance CLOUDY (*) CLEAR    Specific Gravity, Urine 1.021  1.005 - 1.030    pH 6.0  5.0 - 8.0    Glucose, UA NEGATIVE  NEGATIVE mg/dL    Hgb urine dipstick NEGATIVE  NEGATIVE    Bilirubin Urine NEGATIVE  NEGATIVE    Ketones, ur NEGATIVE  NEGATIVE mg/dL    Protein, ur NEGATIVE  NEGATIVE mg/dL    Urobilinogen, UA 1.0  0.0 - 1.0 mg/dL    Nitrite NEGATIVE  NEGATIVE    Leukocytes, UA NEGATIVE  NEGATIVE MICROSCOPIC NOT DONE ON URINES WITH NEGATIVE PROTEIN, BLOOD, LEUKOCYTES, NITRITE, OR GLUCOSE <1000 mg/dL.    Ct Abdomen Pelvis W Contrast  08/25/2012  *RADIOLOGY REPORT*  Clinical Data: Evaluate abdominal wall abscess. History of urachal mass.  CT ABDOMEN AND PELVIS WITH CONTRAST  Technique:  Multidetector CT imaging of the abdomen and pelvis was performed following the standard protocol during bolus administration of intravenous contrast.  Contrast: OMNIPAQUE IOHEXOL 300 MG/ML  SOLN  Comparison: 07/05/2012  Findings:  Mild volume loss at the lung bases.  There is no evidence for free air.  The lower abdominal low density structure now measures 2.5 x 2.9 x 4.1 cm.  Previously this measured 2.2 x 2.0 x 2.3 cm.  There continues to be inflammatory changes around this low density structure.  There  is increased inflammation and development of a fluid collection in the soft tissues anterior to the intra- abdominal collection.  Evidence a small connection or fistula tract between the two collections.  The anterior soft tissue collection measures 4.7 x 6.1 x 4.5 cm.  There is a large amount of inflammation surrounding this anterior subcutaneous collection. Findings are concerning for an abscess collection.  Again noted are multiple gallstones.  There is a normal appearance of the liver, portal venous system, pancreas and spleen.  Normal appearance of both adrenal glands and kidneys.  No gross abnormality to the bladder, prostate or seminal vesicles.  There is a small amount of soft tissue that connects anterior aspect of the bladder to the anterior intra-abdominal collection.  The finding would be consistent a urachal remnant.  Normal appearance of the appendix.  There is no significant free fluid or lymphadenopathy. There are bilateral inguinal hernias containing fat.  The right anterior aspect of bladder extends into the right inguinal hernia as well.  No acute bony abnormality.  IMPRESSION: Enlargement of the anterior intra-abdominal fluid collection with surrounding inflammatory changes.  Based on the location, these findings are suggestive for an infected urachal cyst.  Patient has developed increased inflammatory changes in the anterior subcutaneous tissues with a probable abscess collection measuring up to 6.1 cm.  There appears to be a sinus tract or fistula connection between these two fluid collections.  Cholelithiasis.   Original Report Authenticated By: Richarda Overlie, M.D.     All other review of systems negative or noncontributory except as stated in the HPI  Blood pressure 156/78, pulse 79, temperature 98 F (36.7 C), temperature source Oral, resp. rate 18, height 5\' 11"  (1.803 m), weight 267 lb (121.11 kg), SpO2 95.00%. General appearance: alert, cooperative and no distress Head: Normocephalic,  without obvious abnormality, atraumatic Neck: no JVD and supple, symmetrical, trachea midline Resp: clear to auscultation bilaterally Cardio: normal rate, regular GI: soft, lower abdominal tenderness around area of drainage and cellulitis, he does have purulent drainage and hard area consistent with draining abscess.  Assessment/Plan: Abdominal wall abscess and cellulitis I have reviewed the patient's CT scan and it is consistent with his physical exam. He appears to have a large subcutaneous fluid collection or abscess. Have discussed his case with Dr. Daphine Deutscher as well and we feel that incision and drainage is necessary. I explained that we will be unlikely to definitively treat this urachal cyst annual likely need future procedure to deal with this permanently. The main goal of this procedure would be to drain the abscess and remove the sepsis.  I explained that he will likely need ongoing wound packing and wound care. The other risks of the procedure were discussed including infection, bleeding, fistula, me for wound care and chronic wound, need for repeat surgery, and inadequate drainage and he expressed understanding and would like to proceed with incision and drainage of abdominal wall abscess.   Lodema Pilot DAVID 08/25/2012, 9:21 PM

## 2012-08-25 NOTE — ED Notes (Signed)
CHG bath completed  

## 2012-08-25 NOTE — ED Provider Notes (Addendum)
History     CSN: [id number]  Arrival date & time 08/25/12  1528   First MD Initiated Contact with Patient 08/25/12 1603      Chief Complaint  Patient presents with  . Abscess  . Bleeding/Bruising    (Consider location/radiation/quality/duration/timing/severity/associated sxs/prior treatment) HPI Comments: [name] 66 y.o. male   The chief complaint is: Patient presents with:   Abscess   Bleeding/Bruising    66 year old male with pmh Diabetes presents to emergency department with chief complaint of abscess of the lower abdomen.He does not use injectable insulin. Patient states that he has had a cyst in his abdomen. He is scheduled for surgery this coming Tuesday, October 23 with Dr. Lorre Nick [facility]CS.  Patient states that  Friday he noticed it getting hot and red.  He also noticed drainage  From the area Friday night.  Yesterday when He removed the bandage all the skin came off with the bandaid and he had increased drainage.  Patient has been runnig fevers at home. T-Max 101.9 degrees F. Patient has associated chills and sweats. Advil just PTA at 14:00 and vs currently stable.  Patient denies sig pain. Pain is more sever when palpated.  Denies fmyalgias, arthralgias. Denies DOE, SOB, chest tightness or pressure, radiation to left arm, jaw or back, or diaphoresis. Denies dysuria, flank pain, suprapubic pain, frequency, urgency, or hematuria. Denies headaches, light headedness, weakness, visual disturbances. Denies intraabdominal pain, nausea, vomiting, diarrhea or constipation.      Patient is a 66 y.o. male presenting with abscess. The history is provided by the patient and the spouse.  Abscess  This is a new problem. The current episode started yesterday. The onset was sudden. The abscess is present on the abdomen. The problem is moderate. The abscess is characterized by redness, painfulness, draining, swelling and peeling. The abscess first occurred at home. Associated symptoms  include decreased physical activity and a fever. Pertinent negatives include no anorexia, not sleeping less, not drinking less, no diarrhea, no vomiting, no congestion, no rhinorrhea, no sore throat, no decreased responsiveness and no cough. Services received include medications given and tests performed.    Past Medical History  Diagnosis Date  . CAD, ARTERY BYPASS GRAFT 02/10/2008  . CAROTID ARTERY STENOSIS, WITHOUT INFARCTION 09/22/2008  . CORONARY ARTERY DISEASE 04/03/2008  . ERECTILE DYSFUNCTION 01/14/2009  . GOUT 01/16/2008  . HERPES ZOSTER 04/15/2010  . HYPERLIPIDEMIA 01/16/2008  . HYPERTENSION 01/16/2008  . IMPAIRED GLUCOSE TOLERANCE 08/22/2010  . Diabetes mellitus   . Ulcer 30 yrs ago    stomach  . Shingles 2011    left chest    Past Surgical History  Procedure Date  . Partial gastrectomy 1974    bleeding ulcers  . Coronary artery bypass graft 2009    vessels x3  . Coloscopy 3 months ago    Family History  Problem Relation Age of Onset  . Heart disease Mother   . Heart disease Sister   . Colon cancer Neg Hx     History  Substance Use Topics  . Smoking status: Former Smoker -- 1.0 packs/day for 10 years    Types: Cigarettes    Quit date: 07/27/1999  . Smokeless tobacco: Never Used  . Alcohol Use: 16.8 oz/week    28 Cans of beer per week     4 beers a day      Review of Systems  Constitutional: Positive for fever. Negative for decreased responsiveness.  HENT: Negative for congestion, sore throat and rhinorrhea.  Respiratory: Negative for cough.   Gastrointestinal: Negative for vomiting, diarrhea and anorexia.    Allergies  Review of patient's allergies indicates no known allergies.  Home Medications   Current Outpatient Rx  Name Route Sig Dispense Refill  . ALLOPURINOL 100 MG PO TABS Oral Take 100 mg by mouth daily.    Marland Kitchen AMLODIPINE BESYLATE 10 MG PO TABS Oral Take 10 mg by mouth daily.    . ATORVASTATIN CALCIUM 40 MG PO TABS Oral Take 40 mg by mouth  every morning.     . CEPHALEXIN 500 MG PO CAPS Oral Take 500 mg by mouth 4 (four) times daily.    Marland Kitchen VITAMIN D3 1000 UNITS PO CAPS Oral Take 1 capsule by mouth daily.    . IBUPROFEN 200 MG PO TABS Oral Take 200 mg by mouth every 6 (six) hours as needed. For pain    . METFORMIN HCL 1000 MG PO TABS Oral Take 1,000 mg by mouth 2 (two) times daily with a meal.    . METOPROLOL TARTRATE 50 MG PO TABS Oral Take 50 mg by mouth 2 (two) times daily.    . TRAMADOL HCL 50 MG PO TABS Oral Take 50 mg by mouth every 6 (six) hours as needed. For pain    . ASPIRIN EC 325 MG PO TBEC Oral Take 325 mg by mouth daily.      BP 154/77  Pulse 87  Temp 98.3 F (36.8 C) (Oral)  Resp 18  Ht 5\' 11"  (1.803 m)  Wt 267 lb (121.11 kg)  BMI 37.24 kg/m2  SpO2 100%  Physical Exam  Nursing note and vitals reviewed. Constitutional: He is oriented to person, place, and time. He appears well-developed and well-nourished. No distress.       Obese   HENT:  Head: Normocephalic and atraumatic.  Eyes: Conjunctivae normal and EOM are normal. Pupils are equal, round, and reactive to light. No scleral icterus.  Neck: Normal range of motion. Neck supple.  Cardiovascular: Normal rate, regular rhythm, normal heart sounds and intact distal pulses.   Pulmonary/Chest: Effort normal and breath sounds normal. No respiratory distress.  Abdominal: Soft. Bowel sounds are normal. He exhibits no distension. There is no tenderness.       Large- 15-10 cm area of cellulitis on the pannis of the abdomen.  The is  3-4 cm area of denuding of the skin and surrounding desquamation with a moderate amount of  continuously oozing purulent and bloody discharge, inferolateral to the umbilicus.   Musculoskeletal: He exhibits no edema.  Neurological: He is alert and oriented to person, place, and time.  Skin: Skin is warm and dry. He is not diaphoretic.  Psychiatric: His behavior is normal.    ED Course  Procedures (including critical care  time)   Results for orders placed during the hospital encounter of 08/25/12  CBC WITH DIFFERENTIAL      Component Value Range   WBC 13.1 (*) 4.0 - 10.5 K/uL   RBC 3.87 (*) 4.22 - 5.81 MIL/uL   Hemoglobin 10.8 (*) 13.0 - 17.0 g/dL   HCT 16.1 (*) 09.6 - 04.5 %   MCV 82.4  78.0 - 100.0 fL   MCH 27.9  26.0 - 34.0 pg   MCHC 33.9  30.0 - 36.0 g/dL   RDW 40.9 (*) 81.1 - 91.4 %   Platelets 410 (*) 150 - 400 K/uL   Neutrophils Relative 75  43 - 77 %   Neutro Abs 9.8 (*) 1.7 -  7.7 K/uL   Lymphocytes Relative 13  12 - 46 %   Lymphs Abs 1.7  0.7 - 4.0 K/uL   Monocytes Relative 10  3 - 12 %   Monocytes Absolute 1.3 (*) 0.1 - 1.0 K/uL   Eosinophils Relative 2  0 - 5 %   Eosinophils Absolute 0.2  0.0 - 0.7 K/uL   Basophils Relative 0  0 - 1 %   Basophils Absolute 0.0  0.0 - 0.1 K/uL  COMPREHENSIVE METABOLIC PANEL      Component Value Range   Sodium 132 (*) 135 - 145 mEq/L   Potassium 4.9  3.5 - 5.1 mEq/L   Chloride 99  96 - 112 mEq/L   CO2 24  19 - 32 mEq/L   Glucose, Bld 156 (*) 70 - 99 mg/dL   BUN 22  6 - 23 mg/dL   Creatinine, Ser 2.13  0.50 - 1.35 mg/dL   Calcium 9.6  8.4 - 08.6 mg/dL   Total Protein 8.0  6.0 - 8.3 g/dL   Albumin 2.5 (*) 3.5 - 5.2 g/dL   AST 24  0 - 37 U/L   ALT 18  0 - 53 U/L   Alkaline Phosphatase 82  39 - 117 U/L   Total Bilirubin 0.2 (*) 0.3 - 1.2 mg/dL   GFR calc non Af Amer 65 (*) >90 mL/min   GFR calc Af Amer 75 (*) >90 mL/min  LACTIC ACID, PLASMA      Component Value Range   Lactic Acid, Venous 0.9  0.5 - 2.2 mmol/L  URINALYSIS, ROUTINE W REFLEX MICROSCOPIC      Component Value Range   Color, Urine YELLOW  YELLOW   APPearance CLOUDY (*) CLEAR   Specific Gravity, Urine 1.021  1.005 - 1.030   pH 6.0  5.0 - 8.0   Glucose, UA NEGATIVE  NEGATIVE mg/dL   Hgb urine dipstick NEGATIVE  NEGATIVE   Bilirubin Urine NEGATIVE  NEGATIVE   Ketones, ur NEGATIVE  NEGATIVE mg/dL   Protein, ur NEGATIVE  NEGATIVE mg/dL   Urobilinogen, UA 1.0  0.0 - 1.0 mg/dL    Nitrite NEGATIVE  NEGATIVE   Leukocytes, UA NEGATIVE  NEGATIVE    Ct Abdomen Pelvis W Contrast  08/25/2012  *RADIOLOGY REPORT*  Clinical Data: Evaluate abdominal wall abscess. History of urachal mass.  CT ABDOMEN AND PELVIS WITH CONTRAST  Technique:  Multidetector CT imaging of the abdomen and pelvis was performed following the standard protocol during bolus administration of intravenous contrast.  Contrast: OMNIPAQUE IOHEXOL 300 MG/ML  SOLN  Comparison: 07/05/2012  Findings: Mild volume loss at the lung bases.  There is no evidence for free air.  The lower abdominal low density structure now measures 2.5 x 2.9 x 4.1 cm.  Previously this measured 2.2 x 2.0 x 2.3 cm.  There continues to be inflammatory changes around this low density structure.  There is increased inflammation and development of a fluid collection in the soft tissues anterior to the intra- abdominal collection.  Evidence a small connection or fistula tract between the two collections.  The anterior soft tissue collection measures 4.7 x 6.1 x 4.5 cm.  There is a large amount of inflammation surrounding this anterior subcutaneous collection. Findings are concerning for an abscess collection.  Again noted are multiple gallstones.  There is a normal appearance of the liver, portal venous system, pancreas and spleen.  Normal appearance of both adrenal glands and kidneys.  No gross abnormality to the bladder,  prostate or seminal vesicles.  There is a small amount of soft tissue that connects anterior aspect of the bladder to the anterior intra-abdominal collection.  The finding would be consistent a urachal remnant.  Normal appearance of the appendix.  There is no significant free fluid or lymphadenopathy. There are bilateral inguinal hernias containing fat.  The right anterior aspect of bladder extends into the right inguinal hernia as well.  No acute bony abnormality.  IMPRESSION: Enlargement of the anterior intra-abdominal fluid collection  with surrounding inflammatory changes.  Based on the location, these findings are suggestive for an infected urachal cyst.  Patient has developed increased inflammatory changes in the anterior subcutaneous tissues with a probable abscess collection measuring up to 6.1 cm.  There appears to be a sinus tract or fistula connection between these two fluid collections.  Cholelithiasis.   Original Report Authenticated By: Richarda Overlie, M.D.      1. 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 Cellulitis and abscess       MDM  Patient with obvious cellulitis and report of fever at home.  VS stable here at ED.  Will initiate sepsis work up and order vanc and zosyn for tx. Discussed with Dr. Ranae Palms who agrees with plan.   7:34 PM Patient finished zosyn infusion.  Nurse informed me that he has hive development around infusion site.  I have confirmed hives and not Red man syndrome.  Patient given benadryl and solumedrol. D/C vancomycin.    8:22 PM I spoke with Dr. Biagio Quint who has agreed to admit the patient to surgery.  Informed patient of plan and findings on exam.    Arthor Captain, PA-C 08/25/12 2024  Arthor Captain, PA-C 09/09/12 2141

## 2012-08-25 NOTE — ED Provider Notes (Signed)
Medical screening examination/treatment/procedure(s) were conducted as a shared visit with non-physician practitioner(s) and myself.  I personally evaluated the patient during the encounter   Shelvia Fojtik, MD 08/25/12 2337 

## 2012-08-25 NOTE — Progress Notes (Addendum)
ANTIBIOTIC CONSULT NOTE - INITIAL  Pharmacy Consult for Vanc, Zosyn Indication: sepsis, abd source  No Known Allergies  Patient Measurements: Height: 5\' 11"  (180.3 cm) Weight: 267 lb (121.11 kg) IBW/kg (Calculated) : 75.3   Vital Signs: Temp: 98.3 F (36.8 C) (10/20 1538) Temp src: Oral (10/20 1538) BP: 154/77 mmHg (10/20 1538) Pulse Rate: 87  (10/20 1538) Intake/Output from previous day:   Intake/Output from this shift:    Labs: No results found for this basename: WBC:3,HGB:3,PLT:3,LABCREA:3,CREATININE:3 in the last 72 hours Estimated Creatinine Clearance: 80.3 ml/min (by C-G formula based on Cr of 1.23). No results found for this basename: VANCOTROUGH:2,VANCOPEAK:2,VANCORANDOM:2,GENTTROUGH:2,GENTPEAK:2,GENTRANDOM:2,TOBRATROUGH:2,TOBRAPEAK:2,TOBRARND:2,AMIKACINPEAK:2,AMIKACINTROU:2,AMIKACIN:2, in the last 72 hours   Microbiology: Recent Results (from the past 720 hour(s))  SURGICAL PCR SCREEN     Status: Normal   Collection Time   08/21/12  1:33 PM      Component Value Range Status Comment   MRSA, PCR NEGATIVE  NEGATIVE Final    Staphylococcus aureus NEGATIVE  NEGATIVE Final    Assessment:  39 yof with lower abdominal bleeding from abscess followed by Dr. Daphine Deutscher with plan for laparoscopy and open reduction of probable urachal cyst.  Pt was started on keflex on 10/14 for fever and chills.  Patient here likely with continued fever/chills (no clear admit note yet) MD would like to start patient on Vancomycin and Zosyn per pharmacy consult for sepsis of abd source.   Pt wt 121 kg, SCr 1.23 on 10/16 with CrCl 80 ml/min and normalized CrCl 64 ml/min.  CBC pending, UA and blood cultures ordered (not yet obtained)  Afebrile  Goal of Therapy:  Vancomycin trough level 15-20 mcg/ml Appropriate renal adjustment of abx  Plan:   RN notified to obtain cultures if possible prior to antibiotics administration  Vancomycin 2000 mg IV x 1 as bolus then Vancomycin 1250 mg IV  q12h  Zosyn 3.375 mg IV q8h (first dose over 30 minutes, subsequent doses given over 4 hours)  Pharmacy will f/u  Geoffry Paradise Thi 08/25/2012,5:03 PM

## 2012-08-25 NOTE — ED Notes (Signed)
Pt denies NVD and fever at present

## 2012-08-25 NOTE — Anesthesia Preprocedure Evaluation (Addendum)
Anesthesia Evaluation  Patient identified by MRN, date of birth, ID band Patient awake    Reviewed: Allergy & Precautions, H&P , NPO status , Patient's Chart, lab work & pertinent test results  Airway Mallampati: III TM Distance: >3 FB Neck ROM: Full    Dental  (+) Poor Dentition, Edentulous Upper, Loose and Dental Advisory Given,    Pulmonary former smoker,  breath sounds clear to auscultation  Pulmonary exam normal       Cardiovascular hypertension, Pt. on medications and Pt. on home beta blockers + CAD and + CABG (X3 2009) Rhythm:Regular Rate:Normal  ASCVD   Neuro/Psych negative neurological ROS  negative psych ROS   GI/Hepatic negative GI ROS, Neg liver ROS, (+)     substance abuse (32 cans of beer per week per LMD)  alcohol use,   Endo/Other  diabetes, Type 2, Oral Hypoglycemic AgentsMorbid obesity  Renal/GU negative Renal ROS  negative genitourinary   Musculoskeletal negative musculoskeletal ROS (+)   Abdominal (+) + obese,   Peds negative pediatric ROS (+)  Hematology negative hematology ROS (+)   Anesthesia Other Findings   Reproductive/Obstetrics negative OB ROS                          Anesthesia Physical Anesthesia Plan  ASA: III and Emergent  Anesthesia Plan: General   Post-op Pain Management:    Induction: Intravenous  Airway Management Planned: Oral ETT  Additional Equipment:   Intra-op Plan:   Post-operative Plan: Extubation in OR  Informed Consent: I have reviewed the patients History and Physical, chart, labs and discussed the procedure including the risks, benefits and alternatives for the proposed anesthesia with the patient or authorized representative who has indicated his/her understanding and acceptance.   Dental advisory given  Plan Discussed with: CRNA  Anesthesia Plan Comments:         Anesthesia Quick Evaluation

## 2012-08-26 ENCOUNTER — Encounter (HOSPITAL_COMMUNITY): Payer: Self-pay | Admitting: General Surgery

## 2012-08-26 LAB — CBC
HCT: 31.5 % — ABNORMAL LOW (ref 39.0–52.0)
Hemoglobin: 10.3 g/dL — ABNORMAL LOW (ref 13.0–17.0)
MCH: 27 pg (ref 26.0–34.0)
MCHC: 32.7 g/dL (ref 30.0–36.0)
MCV: 82.5 fL (ref 78.0–100.0)

## 2012-08-26 LAB — GLUCOSE, CAPILLARY
Glucose-Capillary: 222 mg/dL — ABNORMAL HIGH (ref 70–99)
Glucose-Capillary: 222 mg/dL — ABNORMAL HIGH (ref 70–99)
Glucose-Capillary: 188 mg/dL — ABNORMAL HIGH (ref 70–99)

## 2012-08-26 LAB — BASIC METABOLIC PANEL
BUN: 19 mg/dL (ref 6–23)
Creatinine, Ser: 1.04 mg/dL (ref 0.50–1.35)
GFR calc non Af Amer: 74 mL/min — ABNORMAL LOW (ref >90)
Glucose, Bld: 262 mg/dL — ABNORMAL HIGH (ref 70–99)
Potassium: 4.8 mEq/L (ref 3.5–5.1)

## 2012-08-26 MED ORDER — HYDROCODONE-ACETAMINOPHEN 5-325 MG PO TABS
1.0000 | ORAL_TABLET | ORAL | Status: DC | PRN
  Administered 2012-08-26 – 2012-08-28 (×7): 2 via ORAL
  Filled 2012-08-26 (×7): qty 2

## 2012-08-26 MED ORDER — ONDANSETRON HCL 4 MG PO TABS: 4.0000 mg | ORAL_TABLET | Freq: Four times a day (QID) | ORAL | Status: DC | PRN

## 2012-08-26 MED ORDER — ALLOPURINOL 100 MG PO TABS
50.0000 mg | ORAL_TABLET | Freq: Every day | ORAL | Status: DC
  Administered 2012-08-26 – 2012-08-28 (×3): 50 mg via ORAL
  Filled 2012-08-26 (×3): qty 0.5

## 2012-08-26 MED ORDER — HEPARIN SODIUM (PORCINE) 5000 UNIT/ML IJ SOLN
5000.0000 [IU] | Freq: Three times a day (TID) | INTRAMUSCULAR | Status: DC
  Administered 2012-08-26 – 2012-08-28 (×7): 5000 [IU] via SUBCUTANEOUS
  Filled 2012-08-26 (×10): qty 1

## 2012-08-26 MED ORDER — HYDROMORPHONE HCL PF 1 MG/ML IJ SOLN
INTRAMUSCULAR | Status: AC
  Filled 2012-08-26: qty 1

## 2012-08-26 MED ORDER — ASPIRIN EC 325 MG PO TBEC
325.0000 mg | DELAYED_RELEASE_TABLET | Freq: Every day | ORAL | Status: DC
Start: 2012-08-26 — End: 2012-08-28
  Administered 2012-08-26 – 2012-08-28 (×3): 325 mg via ORAL
  Filled 2012-08-26 (×3): qty 1

## 2012-08-26 MED ORDER — INFLUENZA VIRUS VACC SPLIT PF IM SUSP
0.5000 mL | INTRAMUSCULAR | Status: AC
  Administered 2012-08-27: 0.5 mL via INTRAMUSCULAR
  Filled 2012-08-26: qty 0.5

## 2012-08-26 MED ORDER — METFORMIN HCL 500 MG PO TABS
1000.0000 mg | ORAL_TABLET | Freq: Two times a day (BID) | ORAL | Status: DC
  Administered 2012-08-28: 1000 mg via ORAL
  Filled 2012-08-26 (×8): qty 2

## 2012-08-26 MED ORDER — ATORVASTATIN CALCIUM 40 MG PO TABS
40.0000 mg | ORAL_TABLET | Freq: Every day | ORAL | Status: DC
  Administered 2012-08-26 – 2012-08-27 (×2): 40 mg via ORAL
  Filled 2012-08-26 (×3): qty 1

## 2012-08-26 MED ORDER — MORPHINE SULFATE 4 MG/ML IJ SOLN
4.0000 mg | INTRAMUSCULAR | Status: DC | PRN
  Administered 2012-08-26: 4 mg via INTRAVENOUS
  Filled 2012-08-26: qty 1

## 2012-08-26 MED ORDER — INSULIN ASPART 100 UNIT/ML ~~LOC~~ SOLN
0.0000 [IU] | SUBCUTANEOUS | Status: DC
  Administered 2012-08-26 (×2): 5 [IU] via SUBCUTANEOUS
  Administered 2012-08-26 (×2): 3 [IU] via SUBCUTANEOUS
  Administered 2012-08-26: 8 [IU] via SUBCUTANEOUS
  Administered 2012-08-26 – 2012-08-27 (×3): 2 [IU] via SUBCUTANEOUS
  Administered 2012-08-27: 3 [IU] via SUBCUTANEOUS
  Administered 2012-08-27 – 2012-08-28 (×2): 2 [IU] via SUBCUTANEOUS
  Administered 2012-08-28: 08:00:00 via SUBCUTANEOUS

## 2012-08-26 MED ORDER — PIPERACILLIN-TAZOBACTAM 3.375 G IVPB
3.3750 g | Freq: Three times a day (TID) | INTRAVENOUS | Status: DC
  Administered 2012-08-26 – 2012-08-28 (×5): 3.375 g via INTRAVENOUS
  Filled 2012-08-26 (×6): qty 50

## 2012-08-26 MED ORDER — METOPROLOL TARTRATE 50 MG PO TABS
50.0000 mg | ORAL_TABLET | Freq: Two times a day (BID) | ORAL | Status: DC
  Administered 2012-08-26 – 2012-08-28 (×5): 50 mg via ORAL
  Filled 2012-08-26 (×7): qty 1

## 2012-08-26 MED ORDER — BIOTENE DRY MOUTH MT LIQD
15.0000 mL | Freq: Two times a day (BID) | OROMUCOSAL | Status: DC
  Administered 2012-08-26 – 2012-08-27 (×4): 15 mL via OROMUCOSAL

## 2012-08-26 MED ORDER — AMLODIPINE BESYLATE 10 MG PO TABS
10.0000 mg | ORAL_TABLET | Freq: Every day | ORAL | Status: DC
Start: 2012-08-26 — End: 2012-08-28
  Administered 2012-08-26 – 2012-08-28 (×3): 10 mg via ORAL
  Filled 2012-08-26 (×3): qty 1

## 2012-08-26 MED ORDER — ONDANSETRON HCL 4 MG/2ML IJ SOLN: 4.0000 mg | Freq: Four times a day (QID) | INTRAMUSCULAR | Status: DC | PRN

## 2012-08-26 NOTE — Care Management Note (Signed)
    Page 1 of 1   08/28/2012     10:14:49 AM   CARE MANAGEMENT NOTE 08/28/2012  Patient:  [name],[name]   Account Number:  [id number]  Date Initiated:  08/26/2012  Documentation initiated by:  Lorenda Ishihara  Subjective/Objective Assessment:   66 yo male admitted s/p I&D of abd wall abscess. PTA lived at home with spouse.     Action/Plan:   home with wound care   Anticipated DC Date:  08/28/2012   Anticipated DC Plan:  HOME W HOME HEALTH SERVICES      DC Planning Services  CM consult      [facility] Choice  HOME HEALTH   Choice offered to / List presented to:  C-1 Patient        HH arranged  HH-1 RN      [facility] agency  [facility]   Status of service:  Completed, signed off Medicare Important Message given?   (If response is "NO", the following Medicare IM given date fields will be blank) Date Medicare IM given:   Date Additional Medicare IM given:    Discharge Disposition:  HOME W HOME HEALTH SERVICES  Per UR Regulation:  Reviewed for med. necessity/level of care/duration of stay  If discussed at Long Length of Stay Meetings, dates discussed:    Comments:  08-28-12 Lorenda Ishihara RN CM 1000 Spoke with patient at bedside, discussed need for home wound care. Patient wants [facility], contacted Darl Pikes with [facility] to arrange. Plan for daily dressing changes at d/c.

## 2012-08-26 NOTE — Progress Notes (Signed)
Pacu note report given to laurie rn as care giver

## 2012-08-26 NOTE — Progress Notes (Signed)
Patient ID: [name], male   DOB: 08/09/1946, 66 y.o.   MRN: [id number] 1 Day Post-Op  Subjective: Pt s complaints, tolerating diet, denies significant pain.  +flatus, denies n/v  Objective: Vital signs in last 24 hours: Temp:  [97.4 F (36.3 C)-98.6 F (37 C)] 98.1 F (36.7 C) (10/21 0938) Pulse Rate:  [68-87] 72  (10/21 0938) Resp:  [14-21] 16  (10/21 0938) BP: (110-184)/(64-98) 140/74 mmHg (10/21 0938) SpO2:  [94 %-100 %] 99 % (10/21 0938) Weight:  [267 lb (121.11 kg)] 267 lb (121.11 kg) (10/20 1538) Last BM Date: 08/24/12  Intake/Output from previous day: 10/20 0701 - 10/21 0700 In: 1700 [I.V.:1650; IV Piggyback:50] Out: 1525 [Urine:1425; Blood:100] Intake/Output this shift:    PE: Abd: soft, mildly tender around wound, deep wound with beefy red tissue and minimal slough.  Lab Results:   Basename 08/26/12 0345 08/25/12 1725  WBC 10.9* 13.1*  HGB 10.3* 10.8*  HCT 31.5* 31.9*  PLT 404* 410*   BMET  Basename 08/26/12 0345 08/25/12 1725  NA 130* 132*  K 4.8 4.9  CL 100 99  CO2 21 24  GLUCOSE 262* 156*  BUN 19 22  CREATININE 1.04 1.16  CALCIUM 9.1 9.6   PT/INR No results found for this basename: LABPROT:2,INR:2 in the last 72 hours CMP     Component Value Date/Time   NA 130* 08/26/2012 0345   K 4.8 08/26/2012 0345   CL 100 08/26/2012 0345   CO2 21 08/26/2012 0345   GLUCOSE 262* 08/26/2012 0345   BUN 19 08/26/2012 0345   CREATININE 1.04 08/26/2012 0345   CALCIUM 9.1 08/26/2012 0345   PROT 8.0 08/25/2012 1725   ALBUMIN 2.5* 08/25/2012 1725   AST 24 08/25/2012 1725   ALT 18 08/25/2012 1725   ALKPHOS 82 08/25/2012 1725   BILITOT 0.2* 08/25/2012 1725   GFRNONAA 74* 08/26/2012 0345   GFRAA 86* 08/26/2012 0345   Lipase  No results found for this basename: lipase       Studies/Results: Ct Abdomen Pelvis W Contrast  08/25/2012  *RADIOLOGY REPORT*  Clinical Data: Evaluate abdominal wall abscess. History of urachal mass.  CT ABDOMEN AND PELVIS  WITH CONTRAST  Technique:  Multidetector CT imaging of the abdomen and pelvis was performed following the standard protocol during bolus administration of intravenous contrast.  Contrast: OMNIPAQUE IOHEXOL 300 MG/ML  SOLN  Comparison: 07/05/2012  Findings: Mild volume loss at the lung bases.  There is no evidence for free air.  The lower abdominal low density structure now measures 2.5 x 2.9 x 4.1 cm.  Previously this measured 2.2 x 2.0 x 2.3 cm.  There continues to be inflammatory changes around this low density structure.  There is increased inflammation and development of a fluid collection in the soft tissues anterior to the intra- abdominal collection.  Evidence a small connection or fistula tract between the two collections.  The anterior soft tissue collection measures 4.7 x 6.1 x 4.5 cm.  There is a large amount of inflammation surrounding this anterior subcutaneous collection. Findings are concerning for an abscess collection.  Again noted are multiple gallstones.  There is a normal appearance of the liver, portal venous system, pancreas and spleen.  Normal appearance of both adrenal glands and kidneys.  No gross abnormality to the bladder, prostate or seminal vesicles.  There is a small amount of soft tissue that connects anterior aspect of the bladder to the anterior intra-abdominal collection.  The finding would be consistent a  urachal remnant.  Normal appearance of the appendix.  There is no significant free fluid or lymphadenopathy. There are bilateral inguinal hernias containing fat.  The right anterior aspect of bladder extends into the right inguinal hernia as well.  No acute bony abnormality.  IMPRESSION: Enlargement of the anterior intra-abdominal fluid collection with surrounding inflammatory changes.  Based on the location, these findings are suggestive for an infected urachal cyst.  Patient has developed increased inflammatory changes in the anterior subcutaneous tissues with a probable  abscess collection measuring up to 6.1 cm.  There appears to be a sinus tract or fistula connection between these two fluid collections.  Cholelithiasis.   Original Report Authenticated By: Richarda Overlie, M.D.     Anti-infectives: Anti-infectives     Start     Dose/Rate Route Frequency Ordered Stop   08/26/12 0600   vancomycin (VANCOCIN) 1,250 mg in sodium chloride 0.9 % 250 mL IVPB  Status:  Discontinued        1,250 mg 166.7 mL/hr over 90 Minutes Intravenous Every 12 hours 08/25/12 1715 08/25/12 1730   08/26/12 0600   vancomycin (VANCOCIN) 1,250 mg in sodium chloride 0.9 % 250 mL IVPB  Status:  Discontinued        1,250 mg 166.7 mL/hr over 90 Minutes Intravenous Every 12 hours 08/25/12 1734 08/26/12 0049   08/26/12 0200  piperacillin-tazobactam (ZOSYN) IVPB 3.375 g       3.375 g 100 mL/hr over 30 Minutes Intravenous Every 8 hours 08/25/12 1735     08/25/12 1730   vancomycin (VANCOCIN) 2,000 mg in sodium chloride 0.9 % 500 mL IVPB        2,000 mg 250 mL/hr over 120 Minutes Intravenous  Once 08/25/12 1715 08/25/12 2007   08/25/12 1715   piperacillin-tazobactam (ZOSYN) IVPB 3.375 g  Status:  Discontinued        3.375 g 100 mL/hr over 30 Minutes Intravenous  Once 08/25/12 1701 08/25/12 1703   08/25/12 1715   vancomycin (VANCOCIN) IVPB 1000 mg/200 mL premix  Status:  Discontinued        1,000 mg 200 mL/hr over 60 Minutes Intravenous  Once 08/25/12 1701 08/25/12 1703   08/25/12 1715  piperacillin-tazobactam (ZOSYN) IVPB 3.375 g       3.375 g 100 mL/hr over 30 Minutes Intravenous  Once 08/25/12 1704 08/25/12 1816           Assessment/Plan  1. POD#1-abdominal wall abscess drainage: wound ok, pain well controlled  --d/c foley  --wet to dry dressing changes bid  --OOB and ambulate in halls  --await culture results to narrow abx treatment  --cont vanc/zosyn for now   LOS: 1 day    Florencio Hollibaugh 08/26/2012

## 2012-08-26 NOTE — Op Note (Signed)
[name], [name]              ACCOUNT NO.:  [id number]  MEDICAL RECORD NO.:  [id number]  LOCATION:  1532                         FACILITY:  [facility]  PHYSICIAN:  Lodema Pilot, MD       DATE OF BIRTH:  August 29, 1946  DATE OF PROCEDURE:  08/25/2012 DATE OF DISCHARGE:                              OPERATIVE REPORT   PROCEDURE:  Drainage of intra-abdominal and abdominal wall abscess.  PREOPERATIVE DIAGNOSES:  Infected urachal cyst, abdominal wall abscess.  POSTOPERATIVE DIAGNOSES:  Infected urachal cyst, abdominal wall abscess.  SURGEON:  Lodema Pilot, MD  ASSISTANT:  None.  ANESTHESIA:  General endotracheal anesthesia.  FLUIDS:  800 mL of crystalloid.  ESTIMATED BLOOD LOSS:  100 mL.  DRAINS:  Wound packing.  SPECIMENS:  Wound cultures sent to Microbiology.  COMPLICATIONS:  None apparent.  FINDINGS:  Small 2 cm intra-abdominal fluid collection, abscess, and large subcutaneous abscess.  INDICATION FOR PROCEDURE:  [name] is a 66 year old diabetic male with a known urachal cyst.  Over the last 2 days, he has had increasing abdominal pain, redness, and purulent drainage and CT scan consistent with a large subcutaneous fluid collection.  OPERATIVE DETAILS:  [name] was seen and evaluated in the emergency room and risks and benefits of procedure were discussed in lay terms. Informed consent was obtained.  He was given therapeutic antibiotics and taken to the operating room, placed on table in supine position. General endotracheal tube anesthesia was obtained.  His abdomen was prepped and draped in the standard surgical fashion and procedure time- out was performed with all operative team members to confirm proper patient, procedure, and placed the hemostat into one of the draining holes and this tracked about 8 cm straight down.  I excised the area of excoriated skin with drainage and immediately returned significant amount of purulent material.  Cultures were taken and  sent to Microbiology.  Then a circular incision was made in the skin and the subcutaneous tissue was excised around the abscess cavity.  I explored the abscess cavity and this extended down to the fascia and there was a 2 cm opening in the fascia and another 2-3 cm large subfascial fluid collection.  The wall of the cavity appeared to be abscess rind and I did not see any obvious bowel.  I irrigated the wound with several liters of sterile saline solution and hemostasis obtained with Bovie electrocautery.  After hemostasis was obtained, then I again irrigated the wound and again hemostasis was noted to be adequate.  There was no other obvious undrained fluid collection.  The wound was packed with moist Kerlix gauze.  Sterile dressing was applied.  All sponge, needle, and instrument counts correct at end of the case.  The patient tolerated the procedure well without apparent complication.          ______________________________ Lodema Pilot, MD     BL/MEDQ  D:  08/25/2012  T:  08/26/2012  Job:  [id number]

## 2012-08-27 ENCOUNTER — Encounter (HOSPITAL_COMMUNITY): Admission: RE | Payer: Self-pay | Source: Ambulatory Visit

## 2012-08-27 ENCOUNTER — Ambulatory Visit (HOSPITAL_COMMUNITY): Admission: RE | Admit: 2012-08-27 | Payer: BC Managed Care – PPO | Source: Ambulatory Visit | Admitting: Surgery

## 2012-08-27 LAB — GLUCOSE, CAPILLARY
Glucose-Capillary: 131 mg/dL — ABNORMAL HIGH (ref 70–99)
Glucose-Capillary: 132 mg/dL — ABNORMAL HIGH (ref 70–99)
Glucose-Capillary: 136 mg/dL — ABNORMAL HIGH (ref 70–99)
Glucose-Capillary: 137 mg/dL — ABNORMAL HIGH (ref 70–99)

## 2012-08-27 LAB — CBC
Hemoglobin: 9.2 g/dL — ABNORMAL LOW (ref 13.0–17.0)
MCH: 27.1 pg (ref 26.0–34.0)
Platelets: 438 10*3/uL — ABNORMAL HIGH (ref 150–400)
RBC: 3.39 MIL/uL — ABNORMAL LOW (ref 4.22–5.81)
WBC: 12.3 10*3/uL — ABNORMAL HIGH (ref 4.0–10.5)

## 2012-08-27 LAB — BASIC METABOLIC PANEL
CO2: 23 mEq/L (ref 19–32)
Chloride: 104 mEq/L (ref 96–112)
Glucose, Bld: 149 mg/dL — ABNORMAL HIGH (ref 70–99)
Sodium: 135 mEq/L (ref 135–145)

## 2012-08-27 SURGERY — LAPAROSCOPY, DIAGNOSTIC
Anesthesia: General

## 2012-08-27 NOTE — Progress Notes (Signed)
Patient ID: [name], male   DOB: [birth year], 66 y.o.   MRN: [id number] 2 Days Post-Op  Subjective: Pt s complaints, tolerating diet, denies significant pain.  +flatus, denies n/v, doing fine with dressing changes  Objective: Vital signs in last 24 hours: Temp:  [97.5 F (36.4 C)-98.7 F (37.1 C)] 97.6 F (36.4 C) (10/22 0649) Pulse Rate:  [62-72] 63  (10/22 0649) Resp:  [16-18] 18  (10/22 0649) BP: (120-142)/(64-82) 137/82 mmHg (10/22 0649) SpO2:  [96 %-99 %] 96 % (10/22 0649) Last BM Date: 08/24/12  Intake/Output from previous day: 10/21 0701 - 10/22 0700 In: 2825 [P.O.:480; I.V.:2245; IV Piggyback:100] Out: 2800 [Urine:2800] Intake/Output this shift:    PE: Abd: soft, mildly tender around wound, deep wound with beefy red tissue and minimal slough.  Lab Results:   Basename 08/27/12 0430 08/26/12 0345  WBC 12.3* 10.9*  HGB 9.2* 10.3*  HCT 28.3* 31.5*  PLT 438* 404*   BMET  Basename 08/27/12 0430 08/26/12 0345  NA 135 130*  K 4.6 4.8  CL 104 100  CO2 23 21  GLUCOSE 149* 262*  BUN 21 19  CREATININE 1.27 1.04  CALCIUM 9.2 9.1   PT/INR No results found for this basename: LABPROT:2,INR:2 in the last 72 hours CMP     Component Value Date/Time   NA 135 08/27/2012 0430   K 4.6 08/27/2012 0430   CL 104 08/27/2012 0430   CO2 23 08/27/2012 0430   GLUCOSE 149* 08/27/2012 0430   BUN 21 08/27/2012 0430   CREATININE 1.27 08/27/2012 0430   CALCIUM 9.2 08/27/2012 0430   PROT 8.0 08/25/2012 1725   ALBUMIN 2.5* 08/25/2012 1725   AST 24 08/25/2012 1725   ALT 18 08/25/2012 1725   ALKPHOS 82 08/25/2012 1725   BILITOT 0.2* 08/25/2012 1725   GFRNONAA 58* 08/27/2012 0430   GFRAA 67* 08/27/2012 0430   Lipase  No results found for this basename: lipase       Studies/Results: Ct Abdomen Pelvis W Contrast  08/25/2012  *RADIOLOGY REPORT*  Clinical Data: Evaluate abdominal wall abscess. History of urachal mass.  CT ABDOMEN AND PELVIS WITH CONTRAST  Technique:   Multidetector CT imaging of the abdomen and pelvis was performed following the standard protocol during bolus administration of intravenous contrast.  Contrast: OMNIPAQUE IOHEXOL 300 MG/ML  SOLN  Comparison: 07/05/2012  Findings: Mild volume loss at the lung bases.  There is no evidence for free air.  The lower abdominal low density structure now measures 2.5 x 2.9 x 4.1 cm.  Previously this measured 2.2 x 2.0 x 2.3 cm.  There continues to be inflammatory changes around this low density structure.  There is increased inflammation and development of a fluid collection in the soft tissues anterior to the intra- abdominal collection.  Evidence a small connection or fistula tract between the two collections.  The anterior soft tissue collection measures 4.7 x 6.1 x 4.5 cm.  There is a large amount of inflammation surrounding this anterior subcutaneous collection. Findings are concerning for an abscess collection.  Again noted are multiple gallstones.  There is a normal appearance of the liver, portal venous system, pancreas and spleen.  Normal appearance of both adrenal glands and kidneys.  No gross abnormality to the bladder, prostate or seminal vesicles.  There is a small amount of soft tissue that connects anterior aspect of the bladder to the anterior intra-abdominal collection.  The finding would be consistent a urachal remnant.  Normal appearance of the  appendix.  There is no significant free fluid or lymphadenopathy. There are bilateral inguinal hernias containing fat.  The right anterior aspect of bladder extends into the right inguinal hernia as well.  No acute bony abnormality.  IMPRESSION: Enlargement of the anterior intra-abdominal fluid collection with surrounding inflammatory changes.  Based on the location, these findings are suggestive for an infected urachal cyst.  Patient has developed increased inflammatory changes in the anterior subcutaneous tissues with a probable abscess collection measuring  up to 6.1 cm.  There appears to be a sinus tract or fistula connection between these two fluid collections.  Cholelithiasis.   Original Report Authenticated By: Richarda Overlie, M.D.     Anti-infectives: Anti-infectives     Start     Dose/Rate Route Frequency Ordered Stop   08/26/12 1800   piperacillin-tazobactam (ZOSYN) IVPB 3.375 g        3.375 g 12.5 mL/hr over 240 Minutes Intravenous Every 8 hours 08/26/12 1318     08/26/12 0600   vancomycin (VANCOCIN) 1,250 mg in sodium chloride 0.9 % 250 mL IVPB  Status:  Discontinued        1,250 mg 166.7 mL/hr over 90 Minutes Intravenous Every 12 hours 08/25/12 1715 08/25/12 1730   08/26/12 0600   vancomycin (VANCOCIN) 1,250 mg in sodium chloride 0.9 % 250 mL IVPB  Status:  Discontinued        1,250 mg 166.7 mL/hr over 90 Minutes Intravenous Every 12 hours 08/25/12 1734 08/26/12 0049   08/26/12 0200   piperacillin-tazobactam (ZOSYN) IVPB 3.375 g  Status:  Discontinued        3.375 g 100 mL/hr over 30 Minutes Intravenous Every 8 hours 08/25/12 1735 08/26/12 1318   08/25/12 1730   vancomycin (VANCOCIN) 2,000 mg in sodium chloride 0.9 % 500 mL IVPB        2,000 mg 250 mL/hr over 120 Minutes Intravenous  Once 08/25/12 1715 08/25/12 2007   08/25/12 1715   piperacillin-tazobactam (ZOSYN) IVPB 3.375 g  Status:  Discontinued        3.375 g 100 mL/hr over 30 Minutes Intravenous  Once 08/25/12 1701 08/25/12 1703   08/25/12 1715   vancomycin (VANCOCIN) IVPB 1000 mg/200 mL premix  Status:  Discontinued        1,000 mg 200 mL/hr over 60 Minutes Intravenous  Once 08/25/12 1701 08/25/12 1703   08/25/12 1715   piperacillin-tazobactam (ZOSYN) IVPB 3.375 g        3.375 g 100 mL/hr over 30 Minutes Intravenous  Once 08/25/12 1704 08/25/12 1816           Assessment/Plan  1. POD#2-abdominal wall abscess drainage: wound ok, pain well controlled, wbc slightly up today  --keep today on vanc/zosyn  --recheck WBC in AM  --wet to dry dressing changes  bid  --OOB and ambulate in halls  --await culture results to narrow abx treatment  --likely home tomorrow on PO abx.   LOS: 2 days    Mirra Basilio 08/27/2012

## 2012-08-28 LAB — CBC
HCT: 32 % — ABNORMAL LOW (ref 39.0–52.0)
Hemoglobin: 10.2 g/dL — ABNORMAL LOW (ref 13.0–17.0)
MCH: 26.8 pg (ref 26.0–34.0)
MCHC: 31.9 g/dL (ref 30.0–36.0)
MCV: 84 fL (ref 78.0–100.0)
Platelets: 471 10*3/uL — ABNORMAL HIGH (ref 150–400)
RBC: 3.81 MIL/uL — ABNORMAL LOW (ref 4.22–5.81)
RDW: 16.3 % — ABNORMAL HIGH (ref 11.5–15.5)
WBC: 8.8 10*3/uL (ref 4.0–10.5)

## 2012-08-28 LAB — GLUCOSE, CAPILLARY: Glucose-Capillary: 103 mg/dL — ABNORMAL HIGH (ref 70–99)

## 2012-08-28 MED ORDER — HYDROCODONE-ACETAMINOPHEN 5-325 MG PO TABS: 1.0000 | ORAL_TABLET | ORAL | Status: DC | PRN

## 2012-08-28 MED ORDER — AMOXICILLIN-POT CLAVULANATE 500-125 MG PO TABS: 1.0000 | ORAL_TABLET | Freq: Two times a day (BID) | ORAL | Status: DC

## 2012-08-28 MED ORDER — AMOXICILLIN-POT CLAVULANATE 500-125 MG PO TABS
1.0000 | ORAL_TABLET | Freq: Two times a day (BID) | ORAL | Status: DC
  Administered 2012-08-28: 500 mg via ORAL
  Filled 2012-08-28 (×2): qty 1

## 2012-08-28 NOTE — Discharge Instructions (Signed)
1.  Wet to dry dressing changes to abdominal wound daily, lightly pack the wound with wet gauze and cover with dry gauze. 2.  May shower with dressing in place then remove wet dressing and replace. 3.  May return to work on Monday with restriction of no heavy lifting more then 15 pounds until cleared by MD. 4.  Call our office at [phone] with questions or concerns. 5.  Take you antibiotics as prescribed.  Abscess Care After An abscess (also called a boil or furuncle) is an infected area that contains a collection of pus. Signs and symptoms of an abscess include pain, tenderness, redness, or hardness, or you may feel a moveable soft area under your skin. An abscess can occur anywhere in the body. The infection may spread to surrounding tissues causing cellulitis. A cut (incision) by the surgeon was made over your abscess and the pus was drained out. Gauze may have been packed into the space to provide a drain that will allow the cavity to heal from the inside outwards. The boil may be painful for 5 to 7 days. Most people with a boil do not have high fevers. Your abscess, if seen early, may not have localized, and may not have been lanced. If not, another appointment may be required for this if it does not get better on its own or with medications. HOME CARE INSTRUCTIONS   Only take over-the-counter or prescription medicines for pain, discomfort, or fever as directed by your caregiver.  When you bathe, soak and then remove gauze or iodoform packs at least daily or as directed by your caregiver. You may then wash the wound gently with mild soapy water. Repack with gauze or do as your caregiver directs. SEEK IMMEDIATE MEDICAL CARE IF:   You develop increased pain, swelling, redness, drainage, or bleeding in the wound site.  You develop signs of generalized infection including muscle aches, chills, fever, or a general ill feeling.  An oral temperature above 102 F (38.9 C) develops, not controlled  by medication. See your caregiver for a recheck if you develop any of the symptoms described above. If medications (antibiotics) were prescribed, take them as directed. Document Released: 05/11/2005 Document Revised: 01/15/2012 Document Reviewed: 01/06/2008 [facility] Patient Information 2013 [place], [place].

## 2012-08-28 NOTE — Discharge Summary (Signed)
Physician Discharge Summary  Patient ID: PAIGE VANDERWOUDE MRN: [id number] DOB/AGE: 1946/05/24 66 y.o.  Admit date: 08/25/2012 Discharge date: 08/28/2012  Admitting Diagnosis: Abdominal wall abscess  Discharge Diagnosis Patient Active Problem List   Diagnosis Date Noted  . 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 IMPAIRED GLUCOSE TOLERANCE 08/22/2010  . HERPES ZOSTER 04/15/2010  . ERECTILE DYSFUNCTION 01/14/2009  . CAROTID ARTERY STENOSIS, WITHOUT INFARCTION 09/22/2008  . CORONARY ARTERY DISEASE 04/03/2008  . CAD, ARTERY BYPASS GRAFT 02/10/2008  . HYPERLIPIDEMIA 01/16/2008  . GOUT 01/16/2008  . HYPERTENSION 01/16/2008  Abdominal wall abscess  Consultants None  Procedures Drainage of intra-abdominal and abdominal wall abscess  Hospital Course:  66 yr old diabetic male who presented to [facility] with drainage from a urachal cyst that he was scheduled to have surgery on in the next few weeks.  It became very red, pain and started to drainage.  He was admitted and underwent the procedure above.  He tolerated this well.  Packing was placed in the wound.  He was started on IV zosyn and his WBC began trending down.  ON POD#2, the patient was tolerating dressing changes, tolerating diet, was afebrile and the wound was looking good.  He was felt stable for d/c home.  He will do dressing changes twice daily and we will set home health up to assist with this.  He will follow up with Dr. Biagio Quint in 2 weeks.      Medication List     As of 08/28/2012  8:23 AM    TAKE these medications         allopurinol 100 MG tablet   Commonly known as: ZYLOPRIM   Take 100 mg by mouth daily.      amLODipine 10 MG tablet   Commonly known as: NORVASC   Take 10 mg by mouth daily.      amoxicillin-clavulanate 500-125 MG per tablet   Commonly known as: AUGMENTIN   Take 1 tablet (500 mg total) by mouth 2 (two) times daily.      aspirin EC 325 MG tablet   Take 325 mg by mouth daily.     atorvastatin 40 MG tablet   Commonly known as: LIPITOR   Take 40 mg by mouth every morning.      HYDROcodone-acetaminophen 5-325 MG per tablet   Commonly known as: NORCO/VICODIN   Take 1-2 tablets by mouth every 4 (four) hours as needed.      ibuprofen 200 MG tablet   Commonly known as: ADVIL,MOTRIN   Take 200 mg by mouth every 6 (six) hours as needed. For pain      metFORMIN 1000 MG tablet   Commonly known as: GLUCOPHAGE   Take 1,000 mg by mouth 2 (two) times daily with a meal.      metoprolol 50 MG tablet   Commonly known as: LOPRESSOR   Take 50 mg by mouth 2 (two) times daily.      traMADol 50 MG tablet   Commonly known as: ULTRAM   Take 50 mg by mouth every 6 (six) hours as needed. For pain      Vitamin D3 1000 UNITS Caps   Take 1 capsule by mouth daily.      ASK your doctor about these medications         cephALEXin 500 MG capsule   Commonly known as: KEFLEX   Take 500 mg by mouth 4 (four) times daily.  Follow-up Information    Schedule an appointment as soon as possible for a visit with Shelda Pal, MD. (please call for an appointment tomorrow morning )    Contact information:   [facility] [address], [address] [place] [address] [phone]       Follow up with Lodema Pilot DAVID, DO. In 2 weeks. (Our office will call you with your follow up appt.)    Contact information:   [address] [place] [address] [phone]          Signed: Denny Levy [facility] Surgery [phone]  08/28/2012, 8:23 AM

## 2012-08-28 NOTE — Progress Notes (Signed)
Patient is alert and oriented, vital signs are stable, discharge instructions reviewed with patient and family, dressing changed to abd prior to discharge, prescriptions given, patient to follow up with Biagio Quint 09/12/12, spoke to case manager and everything is set up with home health, questions and concerns answered by family. Means, Myrtie Hawk RN 08-28-2012 12:23pm

## 2012-08-29 ENCOUNTER — Telehealth: Payer: Self-pay | Admitting: Internal Medicine

## 2012-08-29 ENCOUNTER — Telehealth (INDEPENDENT_AMBULATORY_CARE_PROVIDER_SITE_OTHER): Payer: Self-pay

## 2012-08-29 LAB — CULTURE, ROUTINE-ABSCESS

## 2012-08-29 MED ORDER — GLUCOSE BLOOD VI STRP: 1.0000 | ORAL_STRIP | Freq: Every day | Status: AC

## 2012-08-29 MED ORDER — ONETOUCH ULTRASOFT LANCETS MISC: 1.0000 | Freq: Every day | Status: AC

## 2012-08-29 NOTE — Telephone Encounter (Signed)
Pts insurance with pay for One Touch. Pt needs test strips called in to [employer] on [address] [phone]. Pt needs the whole kit to test pts bs with. Pls call pt.

## 2012-08-29 NOTE — Telephone Encounter (Signed)
Spoke with wife - asked her to call insurance and see which meter they will pay for strips - we have one touch and freestyle meter here in the office and can give it to them - just need to know which test strips I can call out. She will check and let us know. He is testing only qd right now - not  Insulin dependant

## 2012-08-29 NOTE — Telephone Encounter (Signed)
Spoke with wife - meter here to pick up , rx sent to [facility] for test strips and lancets

## 2012-08-29 NOTE — Telephone Encounter (Signed)
Caller: Carolyn/Spouse; Patient Name: [name]; PCP: Eleonore Chiquito [facility]); Best Callback Phone Number: [phone].  Pt. had recent abdominal surgery, and is visited by a wound nurse.  Pt. was advised by wound nurse to get a blood sugar monitor. Pt. requesting Blood Sugar Monitor that would be covered by insurance.   Pt. uses:  [facility] [address]:  [phone].  PLEASE CONTACT SPOUSE WHEN PRESCRIPTION HAS BEEN CALLED IN.

## 2012-08-29 NOTE — Telephone Encounter (Signed)
[name] called to let Dr. Biagio Quint know after patients insurance his visits would cost $33 a visit. They cannot afford this. She showed patients wife how to do dressing changes and she will be doing them instead of Wagner.

## 2012-08-30 ENCOUNTER — Telehealth (INDEPENDENT_AMBULATORY_CARE_PROVIDER_SITE_OTHER): Payer: Self-pay

## 2012-08-30 ENCOUNTER — Encounter (HOSPITAL_COMMUNITY): Payer: Self-pay

## 2012-08-30 NOTE — Telephone Encounter (Signed)
Home health would like order for Hydrogel dressing changes 3x wk.  Wife has rheumatoid arthritis and is only able to change dressing once daily.  Please advise on Monday if this is feasible.

## 2012-08-31 LAB — CULTURE, BLOOD (ROUTINE X 2): Culture: NO GROWTH

## 2012-08-31 LAB — ANAEROBIC CULTURE

## 2012-09-03 ENCOUNTER — Telehealth (INDEPENDENT_AMBULATORY_CARE_PROVIDER_SITE_OTHER): Payer: Self-pay | Admitting: General Surgery

## 2012-09-03 ENCOUNTER — Other Ambulatory Visit: Payer: Self-pay

## 2012-09-03 NOTE — Telephone Encounter (Signed)
Pt's wife called to report pt has completed his antibiotics, but his wound is still healing.  Denies pt has fever or pain; wound appears healthy and is continuing to heal.  Reassured wife he no longer needs more antibiotics.  She will call for any new onset of pain, fever, or signs of infection.

## 2012-09-03 NOTE — Telephone Encounter (Signed)
Opened in error

## 2012-09-10 NOTE — ED Provider Notes (Signed)
Medical screening examination/treatment/procedure(s) were performed by non-physician practitioner and as supervising physician I was immediately available for consultation/collaboration.   Loren Racer, MD 09/10/12 [phone]

## 2012-09-12 ENCOUNTER — Ambulatory Visit (INDEPENDENT_AMBULATORY_CARE_PROVIDER_SITE_OTHER): Payer: BC Managed Care – PPO | Admitting: General Surgery

## 2012-09-12 ENCOUNTER — Encounter (INDEPENDENT_AMBULATORY_CARE_PROVIDER_SITE_OTHER): Payer: Self-pay | Admitting: General Surgery

## 2012-09-12 VITALS — BP 150/62 | HR 84 | Temp 97.0°F | Resp 20 | Ht 70.5 in | Wt 265.0 lb

## 2012-09-12 DIAGNOSIS — Z5189 Encounter for other specified aftercare: Secondary | ICD-10-CM

## 2012-09-12 DIAGNOSIS — Z4889 Encounter for other specified surgical aftercare: Secondary | ICD-10-CM

## 2012-09-12 NOTE — Progress Notes (Signed)
Subjective:     Patient ID: [name], male   DOB: [birth year], 66 y.o.   MRN: [id number]  HPI This patient follows up status post incision and drainage of infected urachal cyst 2 weeks ago. He has been doing daily wet to dry dressing changes and is no longer on any antibiotics. He has not had any fevers or chills and he denies any pain. He denies any significant drainage from the wound. Cultures were negative for any bacteria. He is asking when he can return to work.  Review of Systems     Objective:   Physical Exam No distress nontoxic-appearing His incision is healing well without any sign of residual infection. It is half the depth that it was at time of surgery and has healthy granulation tissue. It really does look very nice. I repacked the wound today.    Assessment:     Status post incision and drainage of infected urachal cyst I recommended continue wet to dry dressing changes as this is doing very nicely and the wound is doing great.  I recommended tight glucose control which she said is been very well controlled since his procedure. I also recommended multivitamins and protein supplementation and continue dressing changes. I will see him back in 3 weeks or sooner as needed for repeat wound evaluation. As far as return to work, I am okay he returned to work for his health and I do not think they will her anything in his wound. However, he works in the food preparation area and I told him that I am not sure what the [place] requirements are with regard to this. I recommended that he discuss this with his supervisor and explained that he has an open wound and we will be able to release him for work when it is clear by his own workplace.    Plan:     Continue daily wet-to-dry dressing changes and we'll see him back in 3 weeks for repeat wound evaluation. After this wound is healed, we will repeat CT scan of his abdomen to evaluate for possible residual urachal cyst or if this has  removed the issue

## 2012-09-13 ENCOUNTER — Encounter (INDEPENDENT_AMBULATORY_CARE_PROVIDER_SITE_OTHER): Payer: Self-pay

## 2012-09-13 NOTE — Patient Instructions (Signed)
Pt arrived our office today to request a RTW note.  He stated that his employer, [employer], will permit him to return to work as long as his wound is covered.  Dr. Delice Lesch office note stated that he would allow it if pt's employer was agreeable as well.  Letter was given to the patient.

## 2012-09-17 ENCOUNTER — Other Ambulatory Visit: Payer: Self-pay | Admitting: Internal Medicine

## 2012-09-17 NOTE — Addendum Note (Signed)
Addendum  created 09/17/12 1456 by Elesa Massed, CRNA   Modules edited:Anesthesia Responsible Staff

## 2012-09-17 NOTE — Addendum Note (Signed)
Addendum  created 09/17/12 1455 by Elesa Massed, CRNA   Modules edited:Anesthesia Responsible Staff

## 2012-09-18 NOTE — Telephone Encounter (Signed)
ok 

## 2012-09-18 NOTE — Telephone Encounter (Signed)
Refill request for Tramadol. Pt last seen on 9/20. This is a historical med. Ok to refill?

## 2012-09-18 NOTE — Telephone Encounter (Signed)
Rx sent to pharmacy   

## 2012-10-02 ENCOUNTER — Ambulatory Visit (INDEPENDENT_AMBULATORY_CARE_PROVIDER_SITE_OTHER): Payer: BC Managed Care – PPO | Admitting: General Surgery

## 2012-10-02 VITALS — BP 138/80 | HR 65 | Temp 98.6°F | Resp 18 | Ht 70.5 in | Wt 270.6 lb

## 2012-10-02 DIAGNOSIS — Z4889 Encounter for other specified surgical aftercare: Secondary | ICD-10-CM

## 2012-10-02 DIAGNOSIS — Z5189 Encounter for other specified aftercare: Secondary | ICD-10-CM

## 2012-10-02 NOTE — Progress Notes (Signed)
Subjective:     Patient ID: [name], male   DOB: Dec 01, 1945, 66 y.o.   MRN: [id number]  HPI This patient follows up status post incision and drainage of infected abdominal wall abscessand urachal cyst. He is doing well and he continues with wet to dry dressing changes to his abdominal wall. He has returned to work and has no discomfort. No fevers or chills.  Review of Systems     Objective:   Physical Exam No distress and nontoxic-appearing His abdomen wound is healing well. He has a very clean wound without any fibrinous exudate and very nice granulation tissue. The wound continues to get much more shallow and is healing nicely.    Assessment:     Status post incision and drainage of infected abdominal wall-doing well I recommended that he continue with current wound care because the wound looks very nice and is healing well. Recommend a tight glucose control and recommended a followup in about 4 weeks for wound evaluation. He will call me when the wound is almost completely healed and we will consider repeating CT scan to evaluate the urachal cyst after this is healed.    Plan:     Continue wet to dry dressing changes and followup when the wound is almost completely healed.

## 2012-11-06 HISTORY — PX: OTHER SURGICAL HISTORY: SHX169

## 2012-11-25 ENCOUNTER — Other Ambulatory Visit: Payer: Self-pay | Admitting: Internal Medicine

## 2012-12-27 ENCOUNTER — Other Ambulatory Visit: Payer: Self-pay | Admitting: Internal Medicine

## 2013-01-31 ENCOUNTER — Other Ambulatory Visit: Payer: Self-pay | Admitting: Internal Medicine

## 2013-02-25 ENCOUNTER — Other Ambulatory Visit: Payer: Self-pay | Admitting: Internal Medicine

## 2013-03-06 ENCOUNTER — Other Ambulatory Visit: Payer: Self-pay | Admitting: Internal Medicine

## 2013-05-12 ENCOUNTER — Telehealth (INDEPENDENT_AMBULATORY_CARE_PROVIDER_SITE_OTHER): Payer: Self-pay

## 2013-05-12 NOTE — Telephone Encounter (Signed)
Patients wife states patient is having abdomen pain with small bugle . Denies N/V , fever, drainage. Appointment is for 05/29/13 . Advised to go to the ER if condition gets  Worse. Verbalized understanding

## 2013-05-23 ENCOUNTER — Other Ambulatory Visit (INDEPENDENT_AMBULATORY_CARE_PROVIDER_SITE_OTHER): Payer: BC Managed Care – PPO

## 2013-05-23 DIAGNOSIS — Z0000 Encounter for general adult medical examination without abnormal findings: Secondary | ICD-10-CM

## 2013-05-23 LAB — BASIC METABOLIC PANEL
CO2: 24 mEq/L (ref 19–32)
Chloride: 106 mEq/L (ref 96–112)
Glucose, Bld: 127 mg/dL — ABNORMAL HIGH (ref 70–99)
Potassium: 5.4 mEq/L — ABNORMAL HIGH (ref 3.5–5.1)
Sodium: 138 mEq/L (ref 135–145)

## 2013-05-23 LAB — CBC WITH DIFFERENTIAL/PLATELET
Basophils Absolute: 0 10*3/uL (ref 0.0–0.1)
Basophils Relative: 0.5 % (ref 0.0–3.0)
Eosinophils Absolute: 0.2 10*3/uL (ref 0.0–0.7)
Lymphocytes Relative: 21.9 % (ref 12.0–46.0)
MCHC: 33.4 g/dL (ref 30.0–36.0)
Neutrophils Relative %: 66.3 % (ref 43.0–77.0)
Platelets: 303 10*3/uL (ref 150.0–400.0)
RBC: 4.05 Mil/uL — ABNORMAL LOW (ref 4.22–5.81)

## 2013-05-23 LAB — POCT URINALYSIS DIPSTICK
Glucose, UA: NEGATIVE
Ketones, UA: NEGATIVE
Leukocytes, UA: NEGATIVE
Spec Grav, UA: 1.02

## 2013-05-23 LAB — HEPATIC FUNCTION PANEL
ALT: 22 U/L (ref 0–53)
Bilirubin, Direct: 0.1 mg/dL (ref 0.0–0.3)
Total Bilirubin: 0.4 mg/dL (ref 0.3–1.2)

## 2013-05-23 LAB — PSA: PSA: 0.34 ng/mL (ref 0.10–4.00)

## 2013-05-23 LAB — HEMOGLOBIN A1C: Hgb A1c MFr Bld: 7.3 % — ABNORMAL HIGH (ref 4.6–6.5)

## 2013-05-23 LAB — LIPID PANEL
Cholesterol: 155 mg/dL (ref 0–200)
HDL: 44.7 mg/dL (ref >39.00)
LDL Cholesterol: 87 mg/dL (ref 0–99)
VLDL: 23.4 mg/dL (ref 0.0–40.0)

## 2013-05-23 LAB — MICROALBUMIN / CREATININE URINE RATIO
Creatinine,U: 46.2 mg/dL
Microalb, Ur: 3.9 mg/dL — ABNORMAL HIGH (ref 0.0–1.9)

## 2013-05-29 ENCOUNTER — Encounter (INDEPENDENT_AMBULATORY_CARE_PROVIDER_SITE_OTHER): Payer: Self-pay | Admitting: General Surgery

## 2013-05-29 ENCOUNTER — Ambulatory Visit (INDEPENDENT_AMBULATORY_CARE_PROVIDER_SITE_OTHER): Payer: BC Managed Care – PPO | Admitting: General Surgery

## 2013-05-29 VITALS — BP 160/90 | HR 76 | Temp 98.0°F | Resp 16 | Ht 71.0 in | Wt 276.4 lb

## 2013-05-29 DIAGNOSIS — Q644 Malformation of urachus: Secondary | ICD-10-CM

## 2013-05-29 DIAGNOSIS — A084 Viral intestinal infection, unspecified: Secondary | ICD-10-CM

## 2013-05-29 DIAGNOSIS — A088 Other specified intestinal infections: Secondary | ICD-10-CM

## 2013-05-29 NOTE — Progress Notes (Signed)
Subjective:     Patient ID: [name], male   DOB: [birth year], 67 y.o.   MRN: [id number]  HPI This patient follows up for evaluation of abdominal pain which occurred about 2 weeks ago. He had some discomfort around his prior incision and drainage site in his lower midabdomen which was associated with some diarrhea of in some nausea and vomiting. He denies any drainage from his wound or redness around his wound but he thought that he might be having symptoms from his urachal cyst again. His symptoms lasted 3 days and then spontaneously resolved and since then he has been feeling normal again. He really hasn't had any prior symptoms other than this 3 days of discomfort since his wound healed.  Review of Systems     Objective:   Physical Exam His abdomen is soft and nontender his incision is well-healed without any sign of residual infection or mass. There is no evidence of any drainage or connecting sinus    Assessment:     History of urachal cyst status post incision and drainage IM not certain that his recent symptoms were due to his urachal cyst rather they sounded more as though he might have had a short gastroenteritis. He has completely resolved and has no complaints today. Since his wound has healed he has been asymptomatic from his urachal cyst and he does not feel any masses in the area. We discussed the possibility of excision of this urachal cyst but he's feeling currently is that he is not interested in any surgery unless required.  He left the clinic today and he was interested in watchful waiting of this urachal cyst.  However, after further research, I called him back and explained to him that the standard recommendation would be for complete excision, especially in light of the possibility for urachal adenocarcinoma.  After hearing this, he has decided that he would like to go ahead and have excision of this as previously discussed.  I will set him up for repeat CT scan and then  will likely plan for diagnostic laparoscopy with open or lap excision of the urachal cyst.    Plan:     CT abdomen  Plan for dx laparoscopy and excision of urachal cyst

## 2013-05-30 ENCOUNTER — Telehealth (INDEPENDENT_AMBULATORY_CARE_PROVIDER_SITE_OTHER): Payer: Self-pay | Admitting: General Surgery

## 2013-05-30 NOTE — Telephone Encounter (Signed)
LMOM making patient aware to call our office back for CT appt and instructions. To make patient aware that his CT abd/pelvis is set up for next Wednesday 06/04/2013 to arrive at 1:15 pm to [facility] at [address]. He needs to pick up contrast prior to this appt at [facility]. He will eat no solid foods after 9:30 am that day and drink his contrast at 11:30 am and 12:30 pm. Awaiting call back.

## 2013-05-30 NOTE — Telephone Encounter (Signed)
Patient called back. I gave him info on CT. He will come pick up contrast at our office before end of day Tuesday and he has instructions at front desk on when to drink contrast.

## 2013-06-03 ENCOUNTER — Telehealth (INDEPENDENT_AMBULATORY_CARE_PROVIDER_SITE_OTHER): Payer: Self-pay

## 2013-06-03 ENCOUNTER — Encounter: Payer: Self-pay | Admitting: Internal Medicine

## 2013-06-03 NOTE — Telephone Encounter (Signed)
Patient comes to office today to pick up contrast for CT Scan scheduled for 06/04/13 @ 1:30 pm @ [facility].  Patient states he rec'd a message to see Eunice Blase in Surgery Scheduling when he comes to our office to pickup contrast for Ct Scan.  Patient met with Eunice Blase in Surgery Scheduling to discuss dates for future surgery for Excision of Urachal Cyst.  But, patient was confused in regards to why he was scheduling surgery.  I spoke to patient and reviewed Dr. Delice Lesch last office note and explained in details to patient.  Patient decided to wait until after CT Scan results tomorrow to see if surgical excision would be recommended.  But, patient still seemed slightly confused, I told patient that I will forward message to Dr. Biagio Quint and he will call him after we receive CTresults to discuss findings if any and next Plan of Care.  Patient verbalized understanding and will await our phone call tomorrow.

## 2013-06-04 ENCOUNTER — Telehealth (INDEPENDENT_AMBULATORY_CARE_PROVIDER_SITE_OTHER): Payer: Self-pay | Admitting: General Surgery

## 2013-06-04 ENCOUNTER — Ambulatory Visit
Admission: RE | Admit: 2013-06-04 | Discharge: 2013-06-04 | Disposition: A | Discharge status: Home / Self Care | Payer: BC Managed Care – PPO | Source: Ambulatory Visit | Attending: General Surgery | Admitting: General Surgery

## 2013-06-04 DIAGNOSIS — Q644 Malformation of urachus: Secondary | ICD-10-CM

## 2013-06-04 MED ORDER — IOHEXOL 300 MG/ML  SOLN
125.0000 mL | Freq: Once | INTRAMUSCULAR | Status: AC | PRN
  Administered 2013-06-04: 125 mL via INTRAVENOUS

## 2013-06-04 NOTE — Telephone Encounter (Signed)
I called him to discuss his CT results.  He still has persistent urachal cyst as expected.  I again explained the risks for possible cancer in these cysts and have recommended excision.  He says that he would like to have it removed but needs to coordinate with his work when he can take time off.  He said that he would call me back to let me know when he wants to schedule his procedure.  I explained that I was going to refer him to urology, Dr. Mena Goes so we could plan a joint procedure since I have reviewed the CT with him and he agrees in the need for excision.  He will assist for bladder repair.

## 2013-06-05 ENCOUNTER — Telehealth (INDEPENDENT_AMBULATORY_CARE_PROVIDER_SITE_OTHER): Payer: Self-pay

## 2013-06-05 ENCOUNTER — Other Ambulatory Visit (INDEPENDENT_AMBULATORY_CARE_PROVIDER_SITE_OTHER): Payer: Self-pay

## 2013-06-05 ENCOUNTER — Telehealth (INDEPENDENT_AMBULATORY_CARE_PROVIDER_SITE_OTHER): Payer: Self-pay | Admitting: General Surgery

## 2013-06-05 DIAGNOSIS — Q644 Malformation of urachus: Secondary | ICD-10-CM

## 2013-06-05 NOTE — Telephone Encounter (Signed)
Patient called in stating he wants to schedule surgery. After reviewing recent office and telephone notes I asked if he has seen urology. He says no. I advised I will put referral in for him to see Dr Mena Goes with urology and we would call him with that appointment.

## 2013-06-05 NOTE — Telephone Encounter (Signed)
Spoke with pt's wife and gave her the information for his appt w/ Dr. Mena Goes on 06/24/13 at 12:30 w/ arrival time of 12:00.  Address and phone number were also given.

## 2013-06-09 ENCOUNTER — Encounter (INDEPENDENT_AMBULATORY_CARE_PROVIDER_SITE_OTHER): Payer: Self-pay | Admitting: General Surgery

## 2013-06-09 ENCOUNTER — Ambulatory Visit (INDEPENDENT_AMBULATORY_CARE_PROVIDER_SITE_OTHER): Payer: BC Managed Care – PPO | Admitting: General Surgery

## 2013-06-09 ENCOUNTER — Telehealth (INDEPENDENT_AMBULATORY_CARE_PROVIDER_SITE_OTHER): Payer: Self-pay

## 2013-06-09 VITALS — BP 154/78 | HR 96 | Temp 98.9°F | Resp 12 | Ht 71.0 in | Wt 277.0 lb

## 2013-06-09 DIAGNOSIS — Q644 Malformation of urachus: Secondary | ICD-10-CM

## 2013-06-09 NOTE — Telephone Encounter (Signed)
Pt calling this morning stating a "cyst" on his lower abdomen burst last night and drained.  He says this is related to his urological issue.  Scheduled him to come in to see Dr. Derrell Lolling in urgent office this afternoon.

## 2013-06-09 NOTE — Progress Notes (Signed)
Patient ID: [name], male   DOB: 1945/11/20, 67 y.o.   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 Biagio Quint for an urachal cyst.  The patient states that he had some drainage beginning Sunday night. He states the drainage is serosanguineous. He has had no fevers, or erythema around the area.  The patient states he is currently being worked up for an urachal cyst remnant and is to be removed by Dr. Biagio Quint in the next several weeks.  On exam: Patient has an area of superficial, there is minimal serous drainage coming from the area. Upon aspiration of the area there was no fluid could be aspirated.  Assessment and plan: 1. I discussed with the patient signs and symptoms of possible infection. Should these began he can call for appointment to be evaluated for possible antibiotic coverage. 2. I asked the patient to continue his work up at this time for excision on 10/22 with Dr. Biagio Quint.

## 2013-06-20 ENCOUNTER — Ambulatory Visit (INDEPENDENT_AMBULATORY_CARE_PROVIDER_SITE_OTHER): Payer: BC Managed Care – PPO | Admitting: Internal Medicine

## 2013-06-20 ENCOUNTER — Encounter: Payer: Self-pay | Admitting: Internal Medicine

## 2013-06-20 VITALS — BP 132/76 | HR 75 | Temp 98.2°F | Resp 20 | Ht 69.25 in | Wt 276.0 lb

## 2013-06-20 DIAGNOSIS — I10 Essential (primary) hypertension: Secondary | ICD-10-CM

## 2013-06-20 DIAGNOSIS — I2510 Atherosclerotic heart disease of native coronary artery without angina pectoris: Secondary | ICD-10-CM

## 2013-06-20 DIAGNOSIS — E1151 Type 2 diabetes mellitus with diabetic peripheral angiopathy without gangrene: Secondary | ICD-10-CM | POA: Insufficient documentation

## 2013-06-20 DIAGNOSIS — Q644 Malformation of urachus: Secondary | ICD-10-CM

## 2013-06-20 DIAGNOSIS — E119 Type 2 diabetes mellitus without complications: Secondary | ICD-10-CM

## 2013-06-20 DIAGNOSIS — E785 Hyperlipidemia, unspecified: Secondary | ICD-10-CM

## 2013-06-20 DIAGNOSIS — I6529 Occlusion and stenosis of unspecified carotid artery: Secondary | ICD-10-CM

## 2013-06-20 DIAGNOSIS — Z0000 Encounter for general adult medical examination without abnormal findings: Secondary | ICD-10-CM

## 2013-06-20 MED ORDER — TRAMADOL HCL 50 MG PO TABS: 50.0000 mg | ORAL_TABLET | Freq: Four times a day (QID) | ORAL | Status: DC | PRN

## 2013-06-20 MED ORDER — GLIPIZIDE ER 2.5 MG PO TB24: 2.5000 mg | ORAL_TABLET | Freq: Every day | ORAL | Status: DC

## 2013-06-20 NOTE — Progress Notes (Signed)
Subjective:    Patient ID: [name], male    DOB: 1946-07-21, 67 y.o.   MRN: [id number]  HPI  67 year old patient who is seen today for annual health examination. He has not been seen in over one year. Medical problems include diabetes hypertension dyslipidemia. In October of last year he underwent surgery for a urachal cyst that became infected. He has carotid artery disease as well as carotid artery Doppler study was in 2009. He did have colonoscopy 2013. He denies any cerebral vascular or cardiopulmonary complaints  Past Medical History  Diagnosis Date  . CAD, ARTERY BYPASS GRAFT 02/10/2008  . CAROTID ARTERY STENOSIS, WITHOUT INFARCTION 09/22/2008  . CORONARY ARTERY DISEASE 04/03/2008  . ERECTILE DYSFUNCTION 01/14/2009  . GOUT 01/16/2008  . HERPES ZOSTER 04/15/2010  . HYPERLIPIDEMIA 01/16/2008  . HYPERTENSION 01/16/2008  . IMPAIRED GLUCOSE TOLERANCE 08/22/2010  . Diabetes mellitus   . Ulcer 30 yrs ago    stomach  . Shingles 2011    left chest    History   Social History  . Marital Status: Married    Spouse Name: N/A    Number of Children: N/A  . Years of Education: N/A   Occupational History  . Not on file.   Social History Main Topics  . Smoking status: Former Smoker -- 1.00 packs/day for 10 years    Types: Cigarettes    Quit date: 07/27/1999  . Smokeless tobacco: Never Used  . Alcohol Use: 16.8 oz/week    28 Cans of beer per week     Comment: 4 beers a day  . Drug Use: No  . Sexual Activity: Not on file   Other Topics Concern  . Not on file   Social History Narrative  . No narrative on file    Past Surgical History  Procedure Laterality Date  . Partial gastrectomy  1974    bleeding ulcers  . Coronary artery bypass graft  2009    vessels x3  . Coloscopy  3 months ago  . Laparotomy  08/25/2012    Procedure: EXPLORATORY LAPAROTOMY;  Surgeon: Lodema Pilot, DO;  Location: [facility];  Service: General;  Laterality: N/A;  incision and drainage abdominal wall  abcessand intra abdominal wall abcess    Family History  Problem Relation Age of Onset  . Heart disease Mother   . Heart disease Sister   . Colon cancer Neg Hx     No Known Allergies  Current Outpatient Prescriptions on File Prior to Visit  Medication Sig Dispense Refill  . allopurinol (ZYLOPRIM) 100 MG tablet Take 100 mg by mouth daily.      Marland Kitchen amLODipine (NORVASC) 10 MG tablet TAKE 1 TABLET (10 MG TOTAL) BY MOUTH DAILY.  90 tablet  1  . aspirin EC 325 MG tablet Take 325 mg by mouth daily.      Marland Kitchen atorvastatin (LIPITOR) 40 MG tablet TAKE 1 TABLET (40 MG TOTAL) BY MOUTH DAILY.  30 tablet  3  . Cholecalciferol (VITAMIN D3) 1000 UNITS CAPS Take 1 capsule by mouth daily.      Marland Kitchen glucose blood (ONE TOUCH ULTRA TEST) test strip 1 each by Other route daily. Use as instructed  100 each  12  . ibuprofen (ADVIL,MOTRIN) 200 MG tablet Take 200 mg by mouth every 6 (six) hours as needed. For pain      . Lancets (ONETOUCH ULTRASOFT) lancets 1 each by Other route daily. Use as instructed  100 each  12  . metFORMIN (GLUCOPHAGE)  1000 MG tablet Take 1,000 mg by mouth 2 (two) times daily with a meal.      . metoprolol (LOPRESSOR) 50 MG tablet Take 50 mg by mouth 2 (two) times daily.      . Omega 3-6-9 Fatty Acids (OMEGA-3 & OMEGA-6 FISH OIL PO) Take by mouth.       No current facility-administered medications on file prior to visit.    BP 132/76  Pulse 75  Temp(Src) 98.2 F (36.8 C) (Oral)  Resp 20  Ht 5' 9.25" (1.759 m)  Wt 276 lb (125.193 kg)  BMI 40.46 kg/m2  SpO2 97%       Review of Systems  Constitutional: Negative for fever, chills, appetite change and fatigue.  HENT: Negative for hearing loss, ear pain, congestion, sore throat, trouble swallowing, neck stiffness, dental problem, voice change and tinnitus.   Eyes: Negative for pain, discharge and visual disturbance.  Respiratory: Negative for cough, chest tightness, wheezing and stridor.   Cardiovascular: Negative for chest pain,  palpitations and leg swelling.  Gastrointestinal: Negative for nausea, vomiting, abdominal pain, diarrhea, constipation, blood in stool and abdominal distention.  Genitourinary: Negative for urgency, hematuria, flank pain, discharge, difficulty urinating and genital sores.  Musculoskeletal: Negative for myalgias, back pain, joint swelling, arthralgias and gait problem.  Skin: Negative for rash.  Neurological: Negative for dizziness, syncope, speech difficulty, weakness, numbness and headaches.  Hematological: Negative for adenopathy. Does not bruise/bleed easily.  Psychiatric/Behavioral: Negative for behavioral problems and dysphoric mood. The patient is not nervous/anxious.        Objective:   Physical Exam  Constitutional: He appears well-developed and well-nourished.  Obese. Blood pressure 130/76  HENT:  Head: Normocephalic and atraumatic.  Right Ear: External ear normal.  Left Ear: External ear normal.  Nose: Nose normal.  Mouth/Throat: Oropharynx is clear and moist.  Eyes: Conjunctivae and EOM are normal. Pupils are equal, round, and reactive to light. No scleral icterus.  Neck: Normal range of motion. Neck supple. No JVD present. No thyromegaly present.  Cardiovascular: Regular rhythm, normal heart sounds and intact distal pulses.  Exam reveals no gallop and no friction rub.   No murmur heard. Status post sternotomy  Pulmonary/Chest: Effort normal and breath sounds normal. He exhibits no tenderness.  Abdominal: Soft. Bowel sounds are normal. He exhibits no distension and no mass. There is no tenderness.  Circular surgical scar beneath the umbilicus with some surrounding induration  Genitourinary: Prostate normal and penis normal. Guaiac negative stool.  Musculoskeletal: Normal range of motion. He exhibits no edema and no tenderness.  Lymphadenopathy:    He has no cervical adenopathy.  Neurological: He is alert. He has normal reflexes. No cranial nerve deficit. Coordination  normal.  Skin: Skin is warm and dry. No rash noted.  Psychiatric: He has a normal mood and affect. His behavior is normal.          Assessment & Plan:  Preventive health examination Diabetes mellitus. Will add glipizide 2.5 mg daily. Recheck 3 months Carotid artery disease. Will schedule carotid artery duplex study Hypertension stable Dyslipidemia CAD stable

## 2013-06-20 NOTE — Patient Instructions (Signed)
Please check your hemoglobin A1c every 3 months     It is important that you exercise regularly, at least 20 minutes 3 to 4 times per week.  If you develop chest pain or shortness of breath seek  medical attention.  You need to lose weight.  Consider a lower calorie diet and regular exercise.  Limit your sodium (Salt) intake  

## 2013-06-26 ENCOUNTER — Other Ambulatory Visit: Payer: Self-pay | Admitting: Urology

## 2013-06-30 ENCOUNTER — Encounter (INDEPENDENT_AMBULATORY_CARE_PROVIDER_SITE_OTHER): Payer: BC Managed Care – PPO

## 2013-06-30 DIAGNOSIS — I6529 Occlusion and stenosis of unspecified carotid artery: Secondary | ICD-10-CM

## 2013-07-03 ENCOUNTER — Other Ambulatory Visit: Payer: Self-pay | Admitting: Internal Medicine

## 2013-07-18 ENCOUNTER — Encounter (HOSPITAL_COMMUNITY): Payer: Self-pay | Admitting: Pharmacy Technician

## 2013-07-21 ENCOUNTER — Other Ambulatory Visit (HOSPITAL_COMMUNITY): Payer: Self-pay | Admitting: General Surgery

## 2013-07-21 NOTE — Progress Notes (Signed)
08-21-2012 EKG AND 2 VIEW CHEST XRAY EPIC 06-30-2013 CAROTID DOPPLER EPIC

## 2013-07-21 NOTE — Patient Instructions (Addendum)
20 [name]  07/21/2013   Your procedure is scheduled on: 07-29-2013  Report to [facility] at 915 AM.  Call this number if you have problems the morning of surgery [phone]   Remember:FOLLOW ALL BOWEL PREP INSTRUCTIONS FROM DR Biagio Quint   Do not eat food or drink liquids :After Midnight.    Take these medicines the morning of surgery with A SIP OF WATER: amlodipine, lipitor, metoprolol                                SEE [facility] PREPARING FOR SURGERY SHEET             You may not have any metal on your body including hair pins and piercings  Do not wear jewelry, make-up.  Do not wear lotions, powders, or perfumes. You may wear deodorant.   Men may shave face and neck.  Do not bring valuables to the hospital. [facility] IS NOT RESPONSIBLE FOR VALUEABLES.  Contacts, dentures or bridgework may not be worn into surgery.  Leave suitcase in the car. After surgery it may be brought to your room.  For patients admitted to the hospital, checkout time is 11:00 AM the day of discharge.    Call Birdie Sons RN pre op nurse if needed [phone]    FAILURE TO FOLLOW THESE INSTRUCTIONS MAY RESULT IN THE CANCELLATION OF YOUR SURGERY.  PATIENT SIGNATURE___________________________________________  NURSE SIGNATURE_____________________________________________

## 2013-07-22 ENCOUNTER — Encounter (HOSPITAL_COMMUNITY): Payer: Self-pay

## 2013-07-22 ENCOUNTER — Encounter (HOSPITAL_COMMUNITY)
Admission: RE | Admit: 2013-07-22 | Discharge: 2013-07-22 | Disposition: A | Discharge status: Home / Self Care | Payer: BC Managed Care – PPO | Source: Ambulatory Visit | Attending: General Surgery | Admitting: General Surgery

## 2013-07-22 DIAGNOSIS — Z01818 Encounter for other preprocedural examination: Secondary | ICD-10-CM | POA: Insufficient documentation

## 2013-07-22 DIAGNOSIS — Z01812 Encounter for preprocedural laboratory examination: Secondary | ICD-10-CM | POA: Insufficient documentation

## 2013-07-22 LAB — CBC
Platelets: 259 10*3/uL (ref 150–400)
RBC: 4.12 MIL/uL — ABNORMAL LOW (ref 4.22–5.81)
RDW: 16 % — ABNORMAL HIGH (ref 11.5–15.5)
WBC: 5.9 10*3/uL (ref 4.0–10.5)

## 2013-07-22 LAB — BASIC METABOLIC PANEL
Calcium: 9.8 mg/dL (ref 8.4–10.5)
GFR calc Af Amer: 83 mL/min — ABNORMAL LOW (ref >90)
GFR calc non Af Amer: 71 mL/min — ABNORMAL LOW (ref >90)
Sodium: 134 mEq/L — ABNORMAL LOW (ref 135–145)

## 2013-07-22 NOTE — Progress Notes (Signed)
07/22/13 0936  OBSTRUCTIVE SLEEP APNEA  Have you ever been diagnosed with sleep apnea through a sleep study? No  Do you snore loudly (loud enough to be heard through closed doors)?  0  Do you often feel tired, fatigued, or sleepy during the daytime? 0  Has anyone observed you stop breathing during your sleep? 0  Do you have, or are you being treated for high blood pressure? 1  BMI more than 35 kg/m2? 1  Age over 65 years old? 1  Neck circumference greater than 40 cm/18 inches? 0  Gender: 1  Obstructive Sleep Apnea Score 4  Score 4 or greater  Results sent to PCP

## 2013-07-29 ENCOUNTER — Ambulatory Visit (HOSPITAL_COMMUNITY): Payer: BC Managed Care – PPO | Admitting: Anesthesiology

## 2013-07-29 ENCOUNTER — Encounter (HOSPITAL_COMMUNITY): Payer: Self-pay | Admitting: *Deleted

## 2013-07-29 ENCOUNTER — Encounter (HOSPITAL_COMMUNITY): Payer: Self-pay | Admitting: Anesthesiology

## 2013-07-29 ENCOUNTER — Inpatient Hospital Stay (HOSPITAL_COMMUNITY)
Admission: RE | Admit: 2013-07-29 | Discharge: 2013-08-01 | DRG: 309 | Disposition: A | Discharge status: Home / Self Care | Payer: BC Managed Care – PPO | Source: Ambulatory Visit | Attending: General Surgery | Admitting: General Surgery

## 2013-07-29 ENCOUNTER — Encounter (HOSPITAL_COMMUNITY)
Admission: RE | Disposition: A | Discharge status: Home / Self Care | Payer: Self-pay | Source: Ambulatory Visit | Attending: General Surgery

## 2013-07-29 DIAGNOSIS — Q644 Malformation of urachus: Principal | ICD-10-CM

## 2013-07-29 DIAGNOSIS — Z951 Presence of aortocoronary bypass graft: Secondary | ICD-10-CM

## 2013-07-29 DIAGNOSIS — Z23 Encounter for immunization: Secondary | ICD-10-CM

## 2013-07-29 DIAGNOSIS — M109 Gout, unspecified: Secondary | ICD-10-CM | POA: Diagnosis present

## 2013-07-29 DIAGNOSIS — E785 Hyperlipidemia, unspecified: Secondary | ICD-10-CM | POA: Diagnosis present

## 2013-07-29 DIAGNOSIS — R791 Abnormal coagulation profile: Secondary | ICD-10-CM | POA: Diagnosis present

## 2013-07-29 DIAGNOSIS — I2510 Atherosclerotic heart disease of native coronary artery without angina pectoris: Secondary | ICD-10-CM | POA: Diagnosis present

## 2013-07-29 DIAGNOSIS — I10 Essential (primary) hypertension: Secondary | ICD-10-CM | POA: Diagnosis present

## 2013-07-29 HISTORY — PX: LAPAROSCOPY: SHX197

## 2013-07-29 HISTORY — PX: EAR CYST EXCISION: SHX22

## 2013-07-29 LAB — GLUCOSE, CAPILLARY
Glucose-Capillary: 140 mg/dL — ABNORMAL HIGH (ref 70–99)
Glucose-Capillary: 127 mg/dL — ABNORMAL HIGH (ref 70–99)
Glucose-Capillary: 96 mg/dL (ref 70–99)

## 2013-07-29 SURGERY — LAPAROSCOPY, DIAGNOSTIC
Anesthesia: General | Site: Bladder | Wound class: Dirty or Infected

## 2013-07-29 MED ORDER — MORPHINE SULFATE 2 MG/ML IJ SOLN
1.0000 mg | INTRAMUSCULAR | Status: DC | PRN
  Administered 2013-07-30 (×2): 1 mg via INTRAVENOUS
  Administered 2013-07-30: 2 mg via INTRAVENOUS
  Administered 2013-07-30: 1 mg via INTRAVENOUS
  Administered 2013-07-30 – 2013-07-31 (×2): 2 mg via INTRAVENOUS
  Filled 2013-07-29 (×6): qty 1

## 2013-07-29 MED ORDER — LIDOCAINE HCL (CARDIAC) 20 MG/ML IV SOLN
INTRAVENOUS | Status: DC | PRN
  Administered 2013-07-29: 100 mg via INTRAVENOUS

## 2013-07-29 MED ORDER — ALLOPURINOL 100 MG PO TABS
100.0000 mg | ORAL_TABLET | Freq: Every day | ORAL | Status: DC
  Administered 2013-07-30 – 2013-08-01 (×3): 100 mg via ORAL
  Filled 2013-07-29 (×3): qty 1

## 2013-07-29 MED ORDER — SODIUM CHLORIDE 0.9 % IV SOLN
INTRAVENOUS | Status: AC
  Filled 2013-07-29: qty 3

## 2013-07-29 MED ORDER — SODIUM CHLORIDE 0.9 % IV SOLN
INTRAVENOUS | Status: DC
  Administered 2013-07-29 – 2013-07-31 (×5): via INTRAVENOUS

## 2013-07-29 MED ORDER — LIDOCAINE-EPINEPHRINE (PF) 1 %-1:200000 IJ SOLN
INTRAMUSCULAR | Status: DC | PRN
  Administered 2013-07-29: 14:00:00 via INTRAMUSCULAR

## 2013-07-29 MED ORDER — ONDANSETRON HCL 4 MG/2ML IJ SOLN: 4.0000 mg | Freq: Four times a day (QID) | INTRAMUSCULAR | Status: DC | PRN

## 2013-07-29 MED ORDER — METOPROLOL TARTRATE 50 MG PO TABS
50.0000 mg | ORAL_TABLET | Freq: Two times a day (BID) | ORAL | Status: DC
  Administered 2013-07-29 – 2013-08-01 (×6): 50 mg via ORAL
  Filled 2013-07-29 (×7): qty 1

## 2013-07-29 MED ORDER — LACTATED RINGERS IV SOLN
INTRAVENOUS | Status: DC | PRN
  Administered 2013-07-29 (×2): via INTRAVENOUS

## 2013-07-29 MED ORDER — MIDAZOLAM HCL 5 MG/5ML IJ SOLN
INTRAMUSCULAR | Status: DC | PRN
  Administered 2013-07-29: 2 mg via INTRAVENOUS

## 2013-07-29 MED ORDER — LACTATED RINGERS IR SOLN
Status: DC | PRN
  Administered 2013-07-29: 1
  Administered 2013-07-29: 100 mL

## 2013-07-29 MED ORDER — SODIUM CHLORIDE 0.9 % IV SOLN
3.0000 g | Freq: Four times a day (QID) | INTRAVENOUS | Status: AC
  Administered 2013-07-29 – 2013-07-30 (×3): 3 g via INTRAVENOUS
  Filled 2013-07-29 (×3): qty 3

## 2013-07-29 MED ORDER — PROPOFOL 10 MG/ML IV BOLUS
INTRAVENOUS | Status: DC | PRN
  Administered 2013-07-29: 170 mg via INTRAVENOUS

## 2013-07-29 MED ORDER — PEG 3350-KCL-NA BICARB-NACL 420 G PO SOLR: 4000.0000 mL | Freq: Once | ORAL | Status: DC

## 2013-07-29 MED ORDER — LACTATED RINGERS IV SOLN: INTRAVENOUS | Status: DC

## 2013-07-29 MED ORDER — NEOSTIGMINE METHYLSULFATE 1 MG/ML IJ SOLN
INTRAMUSCULAR | Status: DC | PRN
  Administered 2013-07-29: 4 mg via INTRAVENOUS

## 2013-07-29 MED ORDER — HYDROCODONE-ACETAMINOPHEN 5-325 MG PO TABS
1.0000 | ORAL_TABLET | ORAL | Status: DC | PRN
  Administered 2013-07-29 (×2): 1 via ORAL
  Administered 2013-07-30 – 2013-08-01 (×4): 2 via ORAL
  Filled 2013-07-29: qty 2
  Filled 2013-07-29: qty 1
  Filled 2013-07-29 (×2): qty 2
  Filled 2013-07-29: qty 1
  Filled 2013-07-29: qty 2

## 2013-07-29 MED ORDER — AMLODIPINE BESYLATE 10 MG PO TABS
10.0000 mg | ORAL_TABLET | Freq: Every morning | ORAL | Status: DC
  Administered 2013-07-30 – 2013-08-01 (×3): 10 mg via ORAL
  Filled 2013-07-29 (×3): qty 1

## 2013-07-29 MED ORDER — CEFAZOLIN SODIUM-DEXTROSE 2-3 GM-% IV SOLR
INTRAVENOUS | Status: AC
  Filled 2013-07-29: qty 50

## 2013-07-29 MED ORDER — MEPERIDINE HCL 50 MG/ML IJ SOLN: 6.2500 mg | INTRAMUSCULAR | Status: DC | PRN

## 2013-07-29 MED ORDER — HEPARIN SODIUM (PORCINE) 5000 UNIT/ML IJ SOLN
5000.0000 [IU] | Freq: Three times a day (TID) | INTRAMUSCULAR | Status: DC
  Administered 2013-07-29 – 2013-08-01 (×8): 5000 [IU] via SUBCUTANEOUS
  Filled 2013-07-29 (×11): qty 1

## 2013-07-29 MED ORDER — ONDANSETRON HCL 4 MG PO TABS: 4.0000 mg | ORAL_TABLET | Freq: Four times a day (QID) | ORAL | Status: DC | PRN

## 2013-07-29 MED ORDER — DEXTROSE 5 % IV SOLN
3.0000 g | INTRAVENOUS | Status: DC
  Filled 2013-07-29: qty 3000

## 2013-07-29 MED ORDER — METFORMIN HCL 500 MG PO TABS
1000.0000 mg | ORAL_TABLET | Freq: Two times a day (BID) | ORAL | Status: DC
  Administered 2013-07-30 – 2013-08-01 (×4): 1000 mg via ORAL
  Filled 2013-07-29 (×7): qty 2

## 2013-07-29 MED ORDER — ATORVASTATIN CALCIUM 40 MG PO TABS
40.0000 mg | ORAL_TABLET | Freq: Every morning | ORAL | Status: DC
  Administered 2013-07-30 – 2013-08-01 (×3): 40 mg via ORAL
  Filled 2013-07-29 (×3): qty 1

## 2013-07-29 MED ORDER — BUPIVACAINE HCL (PF) 0.25 % IJ SOLN
INTRAMUSCULAR | Status: AC
  Filled 2013-07-29: qty 30

## 2013-07-29 MED ORDER — SUCCINYLCHOLINE CHLORIDE 20 MG/ML IJ SOLN
INTRAMUSCULAR | Status: DC | PRN
  Administered 2013-07-29: 100 mg via INTRAVENOUS

## 2013-07-29 MED ORDER — GLIPIZIDE ER 2.5 MG PO TB24
2.5000 mg | ORAL_TABLET | Freq: Every evening | ORAL | Status: DC
Start: 2013-07-30 — End: 2013-08-01
  Administered 2013-07-31: 2.5 mg via ORAL
  Filled 2013-07-29 (×3): qty 1

## 2013-07-29 MED ORDER — SODIUM CHLORIDE 0.9 % IR SOLN
Status: DC | PRN
  Administered 2013-07-29: 1000 mL

## 2013-07-29 MED ORDER — GLYCOPYRROLATE 0.2 MG/ML IJ SOLN
INTRAMUSCULAR | Status: DC | PRN
  Administered 2013-07-29: 0.6 mg via INTRAVENOUS

## 2013-07-29 MED ORDER — STERILE WATER FOR IRRIGATION IR SOLN
Status: DC | PRN
  Administered 2013-07-29: 1

## 2013-07-29 MED ORDER — CEFAZOLIN SODIUM 1-5 GM-% IV SOLN
INTRAVENOUS | Status: AC
  Filled 2013-07-29: qty 50

## 2013-07-29 MED ORDER — PROMETHAZINE HCL 25 MG/ML IJ SOLN: 6.2500 mg | INTRAMUSCULAR | Status: DC | PRN

## 2013-07-29 MED ORDER — CISATRACURIUM BESYLATE (PF) 10 MG/5ML IV SOLN
INTRAVENOUS | Status: DC | PRN
  Administered 2013-07-29: 2 mg via INTRAVENOUS
  Administered 2013-07-29: 10 mg via INTRAVENOUS
  Administered 2013-07-29: 8 mg via INTRAVENOUS
  Administered 2013-07-29: 4 mg via INTRAVENOUS

## 2013-07-29 MED ORDER — HYDROMORPHONE HCL PF 1 MG/ML IJ SOLN
INTRAMUSCULAR | Status: AC
  Filled 2013-07-29: qty 1

## 2013-07-29 MED ORDER — HEPARIN SODIUM (PORCINE) 5000 UNIT/ML IJ SOLN
5000.0000 [IU] | Freq: Once | INTRAMUSCULAR | Status: AC
  Administered 2013-07-29: 5000 [IU] via SUBCUTANEOUS
  Filled 2013-07-29: qty 1

## 2013-07-29 MED ORDER — ONDANSETRON HCL 4 MG/2ML IJ SOLN
INTRAMUSCULAR | Status: DC | PRN
  Administered 2013-07-29: 4 mg via INTRAVENOUS

## 2013-07-29 MED ORDER — INSULIN ASPART 100 UNIT/ML ~~LOC~~ SOLN
0.0000 [IU] | SUBCUTANEOUS | Status: DC
  Administered 2013-07-30 – 2013-07-31 (×4): 2 [IU] via SUBCUTANEOUS

## 2013-07-29 MED ORDER — HYDROMORPHONE HCL PF 1 MG/ML IJ SOLN
0.2500 mg | INTRAMUSCULAR | Status: DC | PRN
  Administered 2013-07-29 (×4): 0.5 mg via INTRAVENOUS

## 2013-07-29 MED ORDER — SUFENTANIL CITRATE 50 MCG/ML IV SOLN
INTRAVENOUS | Status: DC | PRN
  Administered 2013-07-29 (×3): 10 ug via INTRAVENOUS
  Administered 2013-07-29: 20 ug via INTRAVENOUS

## 2013-07-29 MED ORDER — LIDOCAINE-EPINEPHRINE 1 %-1:100000 IJ SOLN
INTRAMUSCULAR | Status: AC
  Filled 2013-07-29: qty 1

## 2013-07-29 MED ORDER — BUPIVACAINE-EPINEPHRINE PF 0.25-1:200000 % IJ SOLN
INTRAMUSCULAR | Status: AC
  Filled 2013-07-29: qty 30

## 2013-07-29 MED ORDER — ASPIRIN EC 325 MG PO TBEC
325.0000 mg | DELAYED_RELEASE_TABLET | Freq: Every morning | ORAL | Status: DC
  Administered 2013-07-30 – 2013-08-01 (×3): 325 mg via ORAL
  Filled 2013-07-29 (×3): qty 1

## 2013-07-29 MED ORDER — LIDOCAINE HCL 1 % IJ SOLN
INTRAMUSCULAR | Status: AC
  Filled 2013-07-29: qty 20

## 2013-07-29 MED ORDER — EPHEDRINE SULFATE 50 MG/ML IJ SOLN
INTRAMUSCULAR | Status: DC | PRN
  Administered 2013-07-29 (×4): 10 mg via INTRAVENOUS

## 2013-07-29 MED ORDER — SODIUM CHLORIDE 0.9 % IV SOLN
3.0000 g | Freq: Once | INTRAVENOUS | Status: AC
  Administered 2013-07-29: 3 g via INTRAVENOUS

## 2013-07-29 SURGICAL SUPPLY — 72 items
APPLIER CLIP 5 13 M/L LIGAMAX5 (MISCELLANEOUS)
APPLIER CLIP ROT 10 11.4 M/L (STAPLE)
BLADE EXTENDED COATED 6.5IN (ELECTRODE) ×3 IMPLANT
BLADE HEX COATED 2.75 (ELECTRODE) IMPLANT
BLADE SURG SZ10 CARB STEEL (BLADE) IMPLANT
CANISTER SUCTION 2500CC (MISCELLANEOUS) ×3 IMPLANT
CANNULA ENDOPATH XCEL 11M (ENDOMECHANICALS) IMPLANT
CLIP APPLIE 5 13 M/L LIGAMAX5 (MISCELLANEOUS) IMPLANT
CLIP APPLIE ROT 10 11.4 M/L (STAPLE) IMPLANT
CLOTH BEACON ORANGE TIMEOUT ST (SAFETY) ×3 IMPLANT
COVER MAYO STAND STRL (DRAPES) ×3 IMPLANT
DECANTER SPIKE VIAL GLASS SM (MISCELLANEOUS) ×3 IMPLANT
DERMABOND ADVANCED (GAUZE/BANDAGES/DRESSINGS)
DERMABOND ADVANCED .7 DNX12 (GAUZE/BANDAGES/DRESSINGS) IMPLANT
DRAPE LAPAROSCOPIC ABDOMINAL (DRAPES) ×3 IMPLANT
DRAPE WARM FLUID 44X44 (DRAPE) IMPLANT
DRSG TEGADERM 2-3/8X2-3/4 SM (GAUZE/BANDAGES/DRESSINGS) ×9 IMPLANT
ELECT REM PT RETURN 9FT ADLT (ELECTROSURGICAL) ×3
ELECTRODE REM PT RTRN 9FT ADLT (ELECTROSURGICAL) ×2 IMPLANT
FILTER SMOKE EVAC LAPAROSHD (FILTER) IMPLANT
GAUZE SPONGE 2X2 8PLY STRL LF (GAUZE/BANDAGES/DRESSINGS) ×6 IMPLANT
GAUZE XEROFORM 5X9 LF (GAUZE/BANDAGES/DRESSINGS) ×3 IMPLANT
GLOVE BIO SURGEON STRL SZ7 (GLOVE) ×3 IMPLANT
GLOVE BIOGEL M 7.0 STRL (GLOVE) ×3 IMPLANT
GLOVE BIOGEL PI IND STRL 7.0 (GLOVE) ×2 IMPLANT
GLOVE BIOGEL PI INDICATOR 7.0 (GLOVE) ×1
GLOVE SURG SS PI 7.5 STRL IVOR (GLOVE) ×6 IMPLANT
GOWN PREVENTION PLUS LG XLONG (DISPOSABLE) ×6 IMPLANT
GOWN STRL REIN XL XLG (GOWN DISPOSABLE) ×6 IMPLANT
KIT BASIN OR (CUSTOM PROCEDURE TRAY) ×3 IMPLANT
LIGASURE IMPACT 36 18CM CVD LR (INSTRUMENTS) IMPLANT
NS IRRIG 1000ML POUR BTL (IV SOLUTION) ×3 IMPLANT
PENCIL BUTTON HOLSTER BLD 10FT (ELECTRODE) ×3 IMPLANT
POUCH SPECIMEN RETRIEVAL 10MM (ENDOMECHANICALS) ×3 IMPLANT
SCALPEL HARMONIC ACE (MISCELLANEOUS) IMPLANT
SET IRRIG TUBING LAPAROSCOPIC (IRRIGATION / IRRIGATOR) IMPLANT
SOLUTION ANTI FOG 6CC (MISCELLANEOUS) ×3 IMPLANT
SPONGE GAUZE 2X2 STER 10/PKG (GAUZE/BANDAGES/DRESSINGS) ×3
SPONGE GAUZE 4X4 12PLY (GAUZE/BANDAGES/DRESSINGS) ×3 IMPLANT
SPONGE LAP 18X18 X RAY DECT (DISPOSABLE) IMPLANT
STAPLER VISISTAT 35W (STAPLE) ×3 IMPLANT
STRIP CLOSURE SKIN 1/2X4 (GAUZE/BANDAGES/DRESSINGS) IMPLANT
SUCTION POOLE TIP (SUCTIONS) IMPLANT
SUT MNCRL AB 4-0 PS2 18 (SUTURE) ×3 IMPLANT
SUT PDS AB 1 CTX 36 (SUTURE) ×6 IMPLANT
SUT PDS AB 1 TP1 96 (SUTURE) IMPLANT
SUT PROLENE 2 0 KS (SUTURE) IMPLANT
SUT PROLENE 2 0 SH DA (SUTURE) IMPLANT
SUT SILK 2 0 (SUTURE)
SUT SILK 2 0 SH CR/8 (SUTURE) IMPLANT
SUT SILK 2-0 18XBRD TIE 12 (SUTURE) IMPLANT
SUT SILK 3 0 (SUTURE)
SUT SILK 3 0 SH CR/8 (SUTURE) IMPLANT
SUT SILK 3-0 18XBRD TIE 12 (SUTURE) IMPLANT
SUT VIC AB 2-0 SH 27 (SUTURE) ×1
SUT VIC AB 2-0 SH 27X BRD (SUTURE) ×2 IMPLANT
SUT VICRYL 0 UR6 27IN ABS (SUTURE) ×3 IMPLANT
SYS LAPSCP GELPORT 120MM (MISCELLANEOUS)
SYSTEM LAPSCP GELPORT 120MM (MISCELLANEOUS) IMPLANT
TOWEL OR 17X26 10 PK STRL BLUE (TOWEL DISPOSABLE) ×3 IMPLANT
TRAY FOLEY CATH 14FRSI W/METER (CATHETERS) ×3 IMPLANT
TRAY LAP CHOLE (CUSTOM PROCEDURE TRAY) ×3 IMPLANT
TROCAR BLADELESS OPT 5 100 (ENDOMECHANICALS) ×3 IMPLANT
TROCAR BLADELESS OPT 5 75 (ENDOMECHANICALS) ×3 IMPLANT
TROCAR OPTI BLUNT TIP 12M 100M (ENDOMECHANICALS) ×3 IMPLANT
TROCAR SLEEVE XCEL 5X75 (ENDOMECHANICALS) IMPLANT
TROCAR XCEL 12X100 BLDLESS (ENDOMECHANICALS) IMPLANT
TROCAR XCEL BLUNT TIP 100MML (ENDOMECHANICALS) IMPLANT
TROCAR XCEL NON-BLD 11X100MML (ENDOMECHANICALS) IMPLANT
TUBING INSUFFLATION 10FT LAP (TUBING) ×3 IMPLANT
YANKAUER SUCT BULB TIP 10FT TU (MISCELLANEOUS) IMPLANT
YANKAUER SUCT BULB TIP NO VENT (SUCTIONS) IMPLANT

## 2013-07-29 NOTE — H&P (Signed)
   [name] is an 67 y.o. male.  HPI: Known to me for prior I/D of infected urachal cyst 10/13.  The wound had healed and he was doing okay.  However, he has resumed having a small amount of purulent drainage.  He denies any pain, fevers, or chills.    Past Medical History  Diagnosis Date  . CAD, ARTERY BYPASS GRAFT 02/10/2008  . CAROTID ARTERY STENOSIS, WITHOUT INFARCTION 09/22/2008  . CORONARY ARTERY DISEASE 04/03/2008  . ERECTILE DYSFUNCTION 01/14/2009  . GOUT 01/16/2008  . HERPES ZOSTER 04/15/2010  . HYPERLIPIDEMIA 01/16/2008  . HYPERTENSION 01/16/2008  . IMPAIRED GLUCOSE TOLERANCE 08/22/2010  . Ulcer 30 yrs ago    stomach  . Shingles 2011    left chest  . Diabetes mellitus     "borderline"    Past Surgical History  Procedure Laterality Date  . Partial gastrectomy  1974    bleeding ulcers  . Coronary artery bypass graft  2009    vessels x3  . Laparotomy  08/25/2012    Procedure: EXPLORATORY LAPAROTOMY;  Surgeon: Lodema Pilot, DO;  Location: [facility];  Service: General;  Laterality: N/A;  incision and drainage abdominal wall abcessand intra abdominal wall abcess  . Colonoscopy      3 months ago    Family History  Problem Relation Age of Onset  . Heart disease Mother   . Heart disease Sister   . Colon cancer Neg Hx     Social History:  reports that he quit smoking about 14 years ago. His smoking use included Cigarettes. He has a 10 pack-year smoking history. He has never used smokeless tobacco. He reports that  drinks alcohol. He reports that he does not use illicit drugs.  Allergies: No Known Allergies  Medications: I have reviewed the patient's current medications.  Results for orders placed during the hospital encounter of 07/29/13 (from the past 48 hour(s))  GLUCOSE, CAPILLARY     Status: Abnormal   Collection Time    07/29/13 10:11 AM      Result Value Range   Glucose-Capillary 140 (*) 70 - 99 mg/dL   Comment 1 Notify RN      No results found.  All  other review of systems negative or noncontributory except as stated in the HPI   Blood pressure 165/77, pulse 66, temperature 97.5 F (36.4 C), temperature source Oral, resp. rate 18, SpO2 96.00%. General appearance: alert, cooperative and no distress Resp: clear to auscultation bilaterally Cardio: normal rate, regular GI: soft, nt, nd, lower midline wound which is well healed but at the lower end of the scar, he has some small granulation tissue and sinus tract, no erythema or sign of infection today  Assessment/Plan: Urachal cyst I have seen and evaluated him and discussed the pros and cons of surgery and the alternative of continued observation but he would like to have this removed.  We will try combined laparoscopic and open removal.  He expressed understanding of the risks of infection, bleeding, pain, scarring, open wound and need for wound care, recurrence, and cancer needing further procedures, and bowel injury and desires to proceed with planned procedure. Site marked.  Lodema Pilot DAVID 07/29/2013, 10:44 AM

## 2013-07-29 NOTE — Preoperative (Signed)
Beta Blockers   Reason not to administer Beta Blockers:Not Applicable 

## 2013-07-29 NOTE — Progress Notes (Signed)
Dr Biagio Quint notified that Telemetry currently"down" and potentailly , best case, availability in 21/2 hours on the floor.  Dr Biagio Quint ordered transfer to floor and telemetry to be started when available.  MD aware pt will not have cardiac monitoring until tele repaired.

## 2013-07-29 NOTE — Op Note (Signed)
Preoperative diagnosis: Urachal cyst Postoperative diagnosis: Urachal cyst  Procedure: Cystoscopy Foley catheter placement  Surgeon: Mena Goes  Anesthesia: Gen.  Findings: The urethra appeared normal. The prostatic urethra appeared normal and the prostate was short and nonobstructed. The trigone and ureteral orifices were in their normal orthotopic position. The bladder mucosa was normal without lesion, mass, erythema or evidence of fistula. There was no evidence of any connection to the urachal remnant. There were no Foreign bodies or stones in the bladder.  Description of procedure:After consent was obtained patient brought to the operating room. After adequate anesthesia external genitalia were prepped and draped in the usual fashion. A timeout was performed to confirm the patient and procedure. A flexible cystoscope was passed per urethra and the bladder examined. The scope was removed and a 20 [place] Foley catheter was placed without difficulty. The balloon was inflated and seated at the bladder neck. The patient was then turned over to Dr. Biagio Quint for his portion of the procedure which I was present and served as the Geophysicist/field seismologist.  It should be known that after the urachal cyst was completely excised the bladder was filled to 400 cc of fluid and noted to be no leak. There was a bare area on the dome between the peritoneum and possibly the muscle layer of the bladder more anteriorly and Dr. Lovett Sox this area with 2 interrupted figure-of-eight sutures. This nicely reinforced the top of the bladder. The bladder was filled again to 300 cc and noted to have no leak. I checked the Foley catheter and it was noted to be mobile and in good position. The urine was draining clear.

## 2013-07-29 NOTE — Anesthesia Postprocedure Evaluation (Signed)
  Anesthesia Post-op Note  Patient: [name]  Procedure(s) Performed: Procedure(s) (LRB): LAPAROSCOPY DIAGNOSTIC  laparoscopic excision of urachal cyst  (N/A) excision of urachal cyst flexible cystoscopy insertion of foley cath (N/A)  Patient Location: PACU  Anesthesia Type: General  Level of Consciousness: awake and alert   Airway and Oxygen Therapy: Patient Spontanous Breathing  Post-op Pain: mild  Post-op Assessment: Post-op Vital signs reviewed, Patient's Cardiovascular Status Stable, Respiratory Function Stable, Patent Airway and No signs of Nausea or vomiting  Last Vitals:  Filed Vitals:   07/29/13 1643  BP: 157/75  Pulse: 79  Temp: 36.5 C  Resp: 15    Post-op Vital Signs: stable   Complications: No apparent anesthesia complications

## 2013-07-29 NOTE — Brief Op Note (Signed)
07/29/2013  2:08 PM  PATIENT:  [name]  67 y.o. male  PRE-OPERATIVE DIAGNOSIS:  cyst in the abdominal wall   POST-OPERATIVE DIAGNOSIS:  URACHAL CYST IN THE ABDOMINAL WALL   PROCEDURE:  Procedure(s): LAPAROSCOPY DIAGNOSTIC  laparoscopic excision of urachal cyst  (N/A) excision of urachal cyst flexible cystoscopy insertion of foley cath (N/A)  SURGEON:  Surgeon(s) and Role: Panel 1:    * Lodema Pilot, DO - Primary  Panel 2:    * Antony Haste, MD - Primary  PHYSICIAN ASSISTANT:   ASSISTANTS: [place]   ANESTHESIA:   general  EBL:  Total I/O In: 1000 [I.V.:1000] Out: 300 [Urine:250; Blood:50]  BLOOD ADMINISTERED:none  DRAINS: Urinary Catheter (Foley)   LOCAL MEDICATIONS USED:  MARCAINE    and LIDOCAINE   SPECIMEN:  Source of Specimen:  urachal cyst and urachal sinus  DISPOSITION OF SPECIMEN:  PATHOLOGY  COUNTS:  YES  TOURNIQUET:  * No tourniquets in log *  DICTATION: .Other Dictation: Dictation Number [id number]  PLAN OF CARE: Admit to inpatient   PATIENT DISPOSITION:  PACU - hemodynamically stable.   Delay start of Pharmacological VTE agent (>24hrs) due to surgical blood loss or risk of bleeding: no

## 2013-07-29 NOTE — Anesthesia Preprocedure Evaluation (Addendum)
Anesthesia Evaluation  Patient identified by MRN, date of birth, ID band Patient awake    Reviewed: Allergy & Precautions, H&P , NPO status , Patient's Chart, lab work & pertinent test results  Airway Mallampati: III TM Distance: >3 FB Neck ROM: Full    Dental  (+) Poor Dentition, Edentulous Upper, Loose and Dental Advisory Given,    Pulmonary former smoker,  breath sounds clear to auscultation  Pulmonary exam normal       Cardiovascular hypertension, Pt. on medications and Pt. on home beta blockers + CAD and + CABG (X3 2009) Rhythm:Regular Rate:Normal  ASCVD   Neuro/Psych negative neurological ROS  negative psych ROS   GI/Hepatic negative GI ROS, Neg liver ROS, (+)     substance abuse (32 cans of beer per week per LMD)  alcohol use,   Endo/Other  diabetes, Type 2, Oral Hypoglycemic AgentsMorbid obesity  Renal/GU negative Renal ROS  negative genitourinary   Musculoskeletal negative musculoskeletal ROS (+)   Abdominal (+) + obese,   Peds negative pediatric ROS (+)  Hematology negative hematology ROS (+)   Anesthesia Other Findings   Reproductive/Obstetrics negative OB ROS                           Anesthesia Physical  Anesthesia Plan  ASA: III and emergent  Anesthesia Plan: General   Post-op Pain Management:    Induction: Intravenous  Airway Management Planned: Oral ETT  Additional Equipment: Arterial line  Intra-op Plan:   Post-operative Plan: Extubation in OR  Informed Consent: I have reviewed the patients History and Physical, chart, labs and discussed the procedure including the risks, benefits and alternatives for the proposed anesthesia with the patient or authorized representative who has indicated his/her understanding and acceptance.   Dental advisory given  Plan Discussed with: CRNA  Anesthesia Plan Comments:        Anesthesia Quick Evaluation

## 2013-07-29 NOTE — Transfer of Care (Signed)
Immediate Anesthesia Transfer of Care Note  Patient: [name]  Procedure(s) Performed: Procedure(s): LAPAROSCOPY DIAGNOSTIC  laparoscopic excision of urachal cyst  (N/A) excision of urachal cyst flexible cystoscopy insertion of foley cath (N/A)  Patient Location: PACU  Anesthesia Type:General  Level of Consciousness: awake and alert   Airway & Oxygen Therapy: Patient Spontanous Breathing and Patient connected to face mask oxygen  Post-op Assessment: Report given to PACU RN and Post -op Vital signs reviewed and stable  Post vital signs: Reviewed and stable  Complications: No apparent anesthesia complications

## 2013-07-30 ENCOUNTER — Encounter (HOSPITAL_COMMUNITY): Payer: Self-pay | Admitting: General Surgery

## 2013-07-30 LAB — GLUCOSE, CAPILLARY
Glucose-Capillary: 122 mg/dL — ABNORMAL HIGH (ref 70–99)
Glucose-Capillary: 175 mg/dL — ABNORMAL HIGH (ref 70–99)

## 2013-07-30 LAB — BASIC METABOLIC PANEL
CO2: 23 mEq/L (ref 19–32)
Calcium: 9.1 mg/dL (ref 8.4–10.5)
Chloride: 101 mEq/L (ref 96–112)
Creatinine, Ser: 1.05 mg/dL (ref 0.50–1.35)
GFR calc Af Amer: 85 mL/min — ABNORMAL LOW (ref >90)
GFR calc non Af Amer: 73 mL/min — ABNORMAL LOW (ref >90)
Sodium: 134 mEq/L — ABNORMAL LOW (ref 135–145)

## 2013-07-30 LAB — CBC
MCHC: 32.3 g/dL (ref 30.0–36.0)
MCV: 87.3 fL (ref 78.0–100.0)
Platelets: 192 10*3/uL (ref 150–400)
RBC: 3.94 MIL/uL — ABNORMAL LOW (ref 4.22–5.81)
RDW: 16 % — ABNORMAL HIGH (ref 11.5–15.5)
WBC: 8.5 10*3/uL (ref 4.0–10.5)

## 2013-07-30 MED ORDER — INFLUENZA VAC SPLIT QUAD 0.5 ML IM SUSP
0.5000 mL | Freq: Once | INTRAMUSCULAR | Status: AC
  Administered 2013-07-30: 0.5 mL via INTRAMUSCULAR
  Filled 2013-07-30 (×2): qty 0.5

## 2013-07-30 NOTE — Op Note (Signed)
[name], [name]              ACCOUNT NO.:  [id number]  MEDICAL RECORD NO.:  [id number]  LOCATION:  1401                         FACILITY:  [facility]  PHYSICIAN:  Peter Pilot, MD       DATE OF BIRTH:  07-05-1946  DATE OF PROCEDURE:  07/29/2013 DATE OF DISCHARGE:                              OPERATIVE REPORT   PROCEDURE:  Laparoscopic excision of urachal cyst.  PREOPERATIVE DIAGNOSIS:  Urachal cyst.  POSTOPERATIVE DIAGNOSIS:  Urachal cyst.  SURGEON:  Peter Pilot, MD  ASSISTANT:  Dr. Mena Wagner.  ANESTHESIA:  General endotracheal anesthesia with 25 mL of 1% lidocaine with epinephrine and 0.25% Marcaine mixture.  FLUIDS:  2 L crystalloid.  ESTIMATED BLOOD LOSS:  Minimal.  DRAINS:  A 20-French urinary catheter.  SPECIMENS: 1. Urachal cyst. 2. Urachal sinus tract sent to Pathology for permanent sectioning.  COMPLICATIONS:  None apparent.  FINDINGS:  Midline urachal cyst with excision of lower midline urachal sinus tract.  No evidence of purulent material or active infection.  No evidence of bladder involvement.  INDICATION FOR PROCEDURE:  [name] is a 67 year old male who was previously evaluated by [name] for urachal cyst, found as a lower abdominal mass on CT scan.  Prior to its resection, the cyst got infected, and I performed an incision and drainage, and his wound had subsequently healed.  However, since then, he has had some chronic draining sinus from the area of this incision and drainage site.  He also had recommended excision to rule out malignancy.  OPERATIVE DETAILS:  [name] was seen and evaluated in the preoperative area, and risks and benefits of procedure were again discussed in lay terms.  Informed consent was obtained.  He was given prophylactic antibiotics, taken to the operating room, placed on table in supine position.  His arms were tucked bilaterally and general endotracheal anesthesia obtained.  Procedure time-out was performed  with all operative team members to confirm proper patient, procedure.  Dr. Mena Wagner performed flexible cystoscopy to evaluate for internal connection and the ureters were identified.  There was no evidence of any dimpling of the dome of the bladder or any other chronic sinus tract.  We placed a Foley for use in filling the bladder during the procedure.  His abdomen was then prepped and draped in a standard surgical fashion, and another procedure time-out was performed with all of the operative team members and a supraumbilical incision was made in skin.  Dissection was carried down through the subcutaneous tissue using blunt dissection.  Abdominal wall fascia was elevated and sharply incised and peritoneum entered bluntly.  A 12 mm balloon port was placed through this incision, and pneumoperitoneum was obtained.  Laparoscope was introduced.  There was no evidence of bleeding or bowel injury upon entry.  He had relatively minimal adhesions.  Two 5 mm ports were placed laterally, both left and right through the rectus muscle, and he did have some adhesions from the epiploic appendages and omentum which were loosely stuck to the undersurface of the lower midline in the area of the prior infection.  These were easily taken down with blunt dissection.  He did have a tube-like structure in the  area of the median umbilical ligaments, and I scored the peritoneum on each side of this structure down towards the pubis into the bladder.  In the midway along the structure, I created a window between this structure and the abdominal wall and was able to retract the cyst posteriorly.  I then divided the cyst under the umbilicus and then used the cyst itself for posterior retraction.  I carried the dissection in the posterior rectus space, down towards the bladder.  The space of Retzius was entered and the bladder was taken down bluntly from the area of the pubis, isolating the cystic structure.  Midway  along the structure under the area of his prior sinus tract and the prior mass, he had more of a mass-like structure.  I made sure to resect this back to normal tissue on each side.  I carried this down suspending it, also bladder from single structure and amputated this off the muscular surface of the bladder. This was placed in an EndoCatch bag and removed later through the incision used to resect the urachal sinus.  This was sent as a specimen #1 as the urachal cyst.  Abdominal wall was noted to be hemostatic, and we then filled the bladder with 400 mL of fluid until the bladder was well distended, and there was no full-thickness injury of the bladder __________ some of the muscle off the dome of the bladder and then placed 2-0 Vicryl figure-of-eight sutures, approximating the muscular layers of the bladder and another simple interrupted 2-0 Vicryl suture placed laparoscopically for added security, approximating the peritoneum and the muscle over the bladder.  The fluid was let out of the bladder and the sutures were secured.  Dr. Mena Wagner then removed the prior Foley catheter, made sure it was not sutured in place and replaced the catheter for ongoing drainage.  We again insufflated the bladder.  It was noted to be watertight without any evidence of leakage.  The abdominal wall was noted to be hemostatic.  The underlying bowel contents were inspected for any injury and none was identified.  There was no evidence of malignant disease identified.  I then made an elliptical incision around his prior lower midline scar and sinus tract and carried this down to the abdominal wall fascia.  The sinus tract and any firm inflammatory tissue or scar tissue was excised with Bovie electrocautery through to the peritoneum and abdominal cavity.  This was resected and sent as specimen #2 as the urachal sinus tract.  There was no purulence or purulent cavities identified.  The urachal cyst, which was  in the Endo Close bag, was removed through this incision and sent to Pathology as well, and the fascia was then approximated with running #1 PDS suture x2.  The umbilical fascia was approximated with interrupted 0 Vicryl sutures, and the abdomen was re-insufflated with carbon dioxide gas.  Abdominal wall closure was noted to be adequate without any evidence of bleeding or bowel injury.  The final trocar was removed. The abdominal wall was noted to be hemostatic.  The wound was irrigated with sterile saline solution, and the skin edges were loosely approximated with skin staples.  Sterile dressings were applied for the trocar sites, and incisional wound VAC was placed on the lower midline incision.  Foley catheter was left in place, and the patient tolerated the procedure well, was stable and ready for transfer to recovery room in stable condition.         ______________________________ Peter Pilot,  MD    BL/MEDQ  D:  07/29/2013  T:  07/30/2013  Job:  [id number]

## 2013-07-30 NOTE — Progress Notes (Signed)
Patient ID: [name], male   DOB: 13-Nov-1945, 67 y.o.   MRN: [id number]  Pt without complaints. Watching TV. Tolerating regular diet.   Intake/Output Summary (Last 24 hours) at 07/30/13 1723 Last data filed at 07/30/13 1700  Gross per 24 hour  Intake 2211.25 ml  Output   3000 ml  Net -788.75 ml     NAd Abd - soft, mild TTP, ND, wound vac dressing in place GU - foley in place and urine clear  Imp - POD#1 LAPAROSCOPY DIAGNOSTIC laparoscopic excision of urachal cyst (N/A)  excision of urachal cyst flexible cystoscopy insertion of foley cath (N/A) Plan - Continue foley x 10-14 days - f/u with me for cystogram. I discussed plan with patient.

## 2013-07-30 NOTE — Progress Notes (Signed)
Attempted to ambulate patient, states he wants to wait since he did not sleep well last night. Will try again later.

## 2013-07-30 NOTE — Care Management Note (Addendum)
    Page 1 of 1   08/01/2013     12:48:50 PM   CARE MANAGEMENT NOTE 08/01/2013  Patient:  Yo,Garlen J   Account Number:  [id number]  Date Initiated:  07/30/2013  Documentation initiated by:  Lanier Clam  Subjective/Objective Assessment:   66 Y/O M ADMITTED W/URACHAL CYST.     Action/Plan:   FROM HOME.HAS PCP,PHARMACY.   Anticipated DC Date:  08/01/2013   Anticipated DC Plan:  HOME/SELF CARE      DC Planning Services  CM consult      Choice offered to / List presented to:             Status of service:  Completed, signed off Medicare Important Message given?   (If response is "NO", the following Medicare IM given date fields will be blank) Date Medicare IM given:   Date Additional Medicare IM given:    Discharge Disposition:  HOME/SELF CARE  Per UR Regulation:  Reviewed for med. necessity/level of care/duration of stay  If discussed at Long Length of Stay Meetings, dates discussed:    Comments:  08/01/13 Amunique Neyra RN,BSN NCM [phone] WOUND VAC D/C,& NO HOME WOUND VAC NEEDED.D/C HOME NO NEEDS OR ORDERS.  07/30/13 Rhianon Zabawa RN,BSN NCM [phone] POD#1 LAP EXCISION URACHAL CYST.

## 2013-07-30 NOTE — Progress Notes (Signed)
1 Day Post-Op  Subjective: No issues overnight.  Objective: Vital signs in last 24 hours: Temp:  [97 F (36.1 C)-98.8 F (37.1 C)] 98.7 F (37.1 C) (09/24 0430) Pulse Rate:  [65-92] 84 (09/24 0430) Resp:  [10-20] 20 (09/24 0430) BP: (142-166)/(70-87) 151/75 mmHg (09/24 0430) SpO2:  [94 %-100 %] 94 % (09/24 0430) Arterial Line BP: (144-170)/(83-123) 144/123 mmHg (09/23 1415) Weight:  [294 lb 11.2 oz (133.675 kg)] 294 lb 11.2 oz (133.675 kg) (09/23 1900) Last BM Date: 07/29/13  Intake/Output from previous day: 09/23 0701 - 09/24 0700 In: 2773.8 [I.V.:2473.8; IV Piggyback:300] Out: 2550 [Urine:2500; Blood:50] Intake/Output this shift:    General appearance: alert, cooperative and no distress Resp: nonlabored Cardio: normal rate GI: soft, appropriate lower midline incisional tenderness, ND, incisional wound vac in place  Lab Results:   Recent Labs  07/30/13 0420  WBC 8.5  HGB 11.1*  HCT 34.4*  PLT 192   BMET  Recent Labs  07/30/13 0420  NA 134*  K 4.2  CL 101  CO2 23  GLUCOSE 121*  BUN 11  CREATININE 1.05  CALCIUM 9.1   PT/INR No results found for this basename: LABPROT, INR,  in the last 72 hours ABG No results found for this basename: PHART, PCO2, PO2, HCO3,  in the last 72 hours  Studies/Results: No results found.  Anti-infectives: Anti-infectives   Start     Dose/Rate Route Frequency Ordered Stop   07/29/13 1800  Ampicillin-Sulbactam (UNASYN) 3 g in sodium chloride 0.9 % 100 mL IVPB     3 g 100 mL/hr over 60 Minutes Intravenous Every 6 hours 07/29/13 1657 07/30/13 0654   07/29/13 1215  Ampicillin-Sulbactam (UNASYN) 3 g in sodium chloride 0.9 % 100 mL IVPB     3 g 100 mL/hr over 60 Minutes Intravenous  Once 07/29/13 1202 07/29/13 1150   07/29/13 0955  ceFAZolin (ANCEF) 3 g in dextrose 5 % 50 mL IVPB  Status:  Discontinued     3 g 160 mL/hr over 30 Minutes Intravenous On call to O.R. 07/29/13 0955 07/29/13 1645      Assessment/Plan: s/p  Procedure(s): LAPAROSCOPY DIAGNOSTIC  laparoscopic excision of urachal cyst  (N/A) excision of urachal cyst flexible cystoscopy insertion of foley cath (N/A) He looks fine this morning.  advance diet and mobilize.  leave foley in place (he will go home with this for planned removal in the office).    LOS: 1 day    Lodema Pilot DAVID 07/30/2013

## 2013-07-31 ENCOUNTER — Encounter (HOSPITAL_COMMUNITY): Payer: Self-pay

## 2013-07-31 ENCOUNTER — Encounter (HOSPITAL_COMMUNITY): Payer: Self-pay | Admitting: Student

## 2013-07-31 LAB — GLUCOSE, CAPILLARY
Glucose-Capillary: 124 mg/dL — ABNORMAL HIGH (ref 70–99)
Glucose-Capillary: 111 mg/dL — ABNORMAL HIGH (ref 70–99)
Glucose-Capillary: 137 mg/dL — ABNORMAL HIGH (ref 70–99)

## 2013-07-31 LAB — CBC
HCT: 33.5 % — ABNORMAL LOW (ref 39.0–52.0)
Platelets: 188 10*3/uL (ref 150–400)
RBC: 3.78 MIL/uL — ABNORMAL LOW (ref 4.22–5.81)
RDW: 16.6 % — ABNORMAL HIGH (ref 11.5–15.5)
WBC: 7.7 10*3/uL (ref 4.0–10.5)

## 2013-07-31 LAB — BASIC METABOLIC PANEL
BUN: 13 mg/dL (ref 6–23)
Calcium: 9.2 mg/dL (ref 8.4–10.5)
Chloride: 106 mEq/L (ref 96–112)
GFR calc Af Amer: 64 mL/min — ABNORMAL LOW (ref >90)
Glucose, Bld: 138 mg/dL — ABNORMAL HIGH (ref 70–99)
Potassium: 3.8 mEq/L (ref 3.5–5.1)
Sodium: 138 mEq/L (ref 135–145)

## 2013-08-01 LAB — GLUCOSE, CAPILLARY: Glucose-Capillary: 96 mg/dL (ref 70–99)

## 2013-08-01 MED ORDER — HYDROCODONE-ACETAMINOPHEN 5-325 MG PO TABS: 1.0000 | ORAL_TABLET | ORAL | Status: DC | PRN

## 2013-08-01 NOTE — Progress Notes (Signed)
3 Days Post-Op  Subjective: No issues overnight.  Passing flatus, tolerating diet  Objective: Vital signs in last 24 hours: Temp:  [97.6 F (36.4 C)-98.8 F (37.1 C)] 98.1 F (36.7 C) (09/26 0553) Pulse Rate:  [72-87] 73 (09/26 0553) Resp:  [18-20] 18 (09/26 0553) BP: (146-168)/(73-79) 151/73 mmHg (09/26 0553) SpO2:  [94 %-99 %] 94 % (09/26 0553) Last BM Date: 07/31/13  Intake/Output from previous day: 09/25 0701 - 09/26 0700 In: 2100 [P.O.:480; I.V.:1620] Out: 2300 [Urine:2300] Intake/Output this shift:    General appearance: alert, cooperative and no distress Resp: nonlabored Cardio: normal rate, regular GI: soft, mild incisional tenderness, ND, wound vac removed and incision looks good  Lab Results:   Recent Labs  07/30/13 0420 07/31/13 0420  WBC 8.5 7.7  HGB 11.1* 10.7*  HCT 34.4* 33.5*  PLT 192 188   BMET  Recent Labs  07/30/13 0420 07/31/13 0420  NA 134* 138  K 4.2 3.8  CL 101 106  CO2 23 23  GLUCOSE 121* 138*  BUN 11 13  CREATININE 1.05 1.33  CALCIUM 9.1 9.2   PT/INR No results found for this basename: LABPROT, INR,  in the last 72 hours ABG No results found for this basename: PHART, PCO2, PO2, HCO3,  in the last 72 hours  Studies/Results: No results found.  Anti-infectives: Anti-infectives   Start     Dose/Rate Route Frequency Ordered Stop   07/29/13 1800  Ampicillin-Sulbactam (UNASYN) 3 g in sodium chloride 0.9 % 100 mL IVPB     3 g 100 mL/hr over 60 Minutes Intravenous Every 6 hours 07/29/13 1657 07/30/13 0654   07/29/13 1215  Ampicillin-Sulbactam (UNASYN) 3 g in sodium chloride 0.9 % 100 mL IVPB     3 g 100 mL/hr over 60 Minutes Intravenous  Once 07/29/13 1202 07/29/13 1150   07/29/13 0955  ceFAZolin (ANCEF) 3 g in dextrose 5 % 50 mL IVPB  Status:  Discontinued     3 g 160 mL/hr over 30 Minutes Intravenous On call to O.R. 07/29/13 0955 07/29/13 1645      Assessment/Plan: s/p Procedure(s): LAPAROSCOPY DIAGNOSTIC  laparoscopic  excision of urachal cyst  (N/A) excision of urachal cyst flexible cystoscopy insertion of foley cath (N/A) He looks very good.  should be okay for discharge to home with foley.  f/u surgery 10 days and f/u urology for foley removal  LOS: 3 days    Lodema Pilot DAVID 08/01/2013

## 2013-08-01 NOTE — Progress Notes (Signed)
Pt left facility with his spouse at his side at this time.  Pt alert, oriented, and without c/o. Discharge instructions given/explained with pt and spouse verbalizing understanding. Prescription given for pain med.  Followup appoinments noted. Leg bag instructions given and Foley supplies given as well.  Foley catheter intact and patent.  Dressing supplies given for abdominal care.

## 2013-08-01 NOTE — Discharge Summary (Signed)
Physician Discharge Summary  Patient ID: [name] MRN: [id number] DOB/AGE: 65/65/1947 65 y.o.  Admit date: 07/29/2013 Discharge date: 08/01/2013  Admission Diagnoses: urachal cyst  Discharge Diagnoses: same Active Problems:   * No active hospital problems. *   Discharged Condition: stable  Hospital Course: to OR 07/29/13 for lap urachal cystectomy.  No apparent complications.  Diet advanced on POD1 and he continued to improve.  Incisional wound vac left in place until discharge. Wound looked good and ready for discharge  Consults: urology  Significant Diagnostic Studies: none  Treatments: surgery: 07/29/13 lap urachal cystectomy   Disposition: 01-Home or Self Care  Discharge Orders   Future Appointments Provider Department Dept Phone   09/19/2013 3:45 PM [name], [name] [facility] [phone]   Future Orders Complete By Expires   Call [name] for:  persistant nausea and vomiting  As directed    Call [name] for:  redness, tenderness, or signs of infection (pain, swelling, redness, odor or green/yellow discharge around incision site)  As directed    Call [name] for:  severe uncontrolled pain  As directed    Call [name] for:  temperature >100.4  As directed    Diet - low sodium heart healthy  As directed    Discharge instructions  As directed    Comments:     May shower tomorrow Call [phone] to schedule follow up and staple removal in about 10days with Dr. Biagio Wagner Call Dr. Mena Wagner at [facility] to schedule catheter removal. Cover wounds with clean dry gauze as needed.   Increase activity slowly  As directed        Medication List    STOP taking these medications       traMADol 50 MG tablet  Commonly known as:  ULTRAM      TAKE these medications       allopurinol 100 MG tablet  Commonly known as:  ZYLOPRIM  Take 100 mg by mouth daily.     amLODipine 10 MG tablet  Commonly known as:  NORVASC  Take 10 mg by mouth every morning.     aspirin EC 325 MG tablet  Take 325 mg by mouth every morning.     atorvastatin 40 MG tablet  Commonly known as:  LIPITOR  Take 40 mg by mouth every morning.     glipiZIDE 2.5 MG 24 hr tablet  Commonly known as:  GLUCOTROL XL  Take 2.5 mg by mouth every evening.     glucose blood test strip  Commonly known as:  ONE TOUCH ULTRA TEST  1 each by Other route daily. Use as instructed     HYDROcodone-acetaminophen 5-325 MG per tablet  Commonly known as:  NORCO/VICODIN  Take 1 tablet by mouth every 4 (four) hours as needed for pain.     ibuprofen 200 MG tablet  Commonly known as:  ADVIL,MOTRIN  Take 400 mg by mouth every 6 (six) hours as needed. For pain     metFORMIN 1000 MG tablet  Commonly known as:  GLUCOPHAGE  Take 1,000 mg by mouth 2 (two) times daily with a meal.     metoprolol 50 MG tablet  Commonly known as:  LOPRESSOR  Take 50 mg by mouth 2 (two) times daily.     OMEGA-3 & OMEGA-6 FISH OIL PO  Take 1,000 mg by mouth daily.     onetouch ultrasoft lancets  1 each by Other route daily. Use as instructed     Vitamin D3  1000 UNITS Caps  Take 1 capsule by mouth daily.           Follow-up Information   Follow up with [name], [name] In 10 days. (for staple removal)    Specialty:  General Surgery   Contact information:   [address] [place] [address] [phone]       Follow up with [name], [name] In 1 week.   Specialty:  Urology   Contact information:   [address] [place] [address] [phone]       Signed: [name] 08/01/2013, 7:54 AM

## 2013-08-15 ENCOUNTER — Ambulatory Visit (INDEPENDENT_AMBULATORY_CARE_PROVIDER_SITE_OTHER): Payer: BC Managed Care – PPO | Admitting: General Surgery

## 2013-08-15 ENCOUNTER — Encounter (INDEPENDENT_AMBULATORY_CARE_PROVIDER_SITE_OTHER): Payer: Self-pay | Admitting: General Surgery

## 2013-08-15 VITALS — BP 164/92 | HR 74 | Temp 98.0°F | Resp 16 | Ht 70.0 in | Wt 278.6 lb

## 2013-08-15 DIAGNOSIS — Z5189 Encounter for other specified aftercare: Secondary | ICD-10-CM

## 2013-08-15 DIAGNOSIS — Z4889 Encounter for other specified surgical aftercare: Secondary | ICD-10-CM

## 2013-08-15 MED ORDER — HYDROCODONE-ACETAMINOPHEN 5-325 MG PO TABS: 1.0000 | ORAL_TABLET | ORAL | Status: DC | PRN

## 2013-08-15 NOTE — Progress Notes (Signed)
Subjective:     Patient ID: [name], male   DOB: [birth year], 67 y.o.   MRN: [id number]  HPI This patient follows up 2 weeks status post laparoscopic excision of urachal cyst. He has been doing well and feels much better than before his surgery. He is tolerating regular diet his bowels are functioning normally. He had some drainage from his wound but this has stopped. He also has followed up with urology and his catheter has been removed. His pathology was benign.  No fevers or chills  Review of Systems     Objective:   Physical Exam No distress and nontoxic-appearing His incisions are healing well without any sign of infection. I removed his staples today is and from the cyst extraction sites he had some serosanguineous drainage from the staple holes likely draining seroma. Benzoin skin Steri-Strips were applied.    Assessment:     Status post laparoscopic urachal cyst excision  He is recovering nicely from his procedure. There is no evidence of postoperative complication. His pathology was benign. He says that he feels better than before his surgery and his catheter has been removed he is voiding normally and has no complaints. I did refill his pain medication since he says that he has been taking about one per day and explained that I would not be refilling these any longer unless there was signs of complications. He likely has underlying seroma under his incision and this may drain for another few days but I recommended that he he coming back in followup in one week if he has continued drainage at that time. Otherwise he will followup sooner if he has any increasing pain, fevers or chills or redness. If his wounds continue nicely and has no further drainage and he can follow up when necessary.    Plan:     Gradually increase diet and activity as tolerated. Follow up with me in 1 week if any continued drainage

## 2013-08-26 ENCOUNTER — Encounter (INDEPENDENT_AMBULATORY_CARE_PROVIDER_SITE_OTHER): Payer: Self-pay

## 2013-08-26 ENCOUNTER — Telehealth (INDEPENDENT_AMBULATORY_CARE_PROVIDER_SITE_OTHER): Payer: Self-pay

## 2013-08-26 NOTE — Telephone Encounter (Signed)
Called and left message for patient to make aware that his RTW note is ready for pick up and will be at front desk check in when he's ready to pick up.

## 2013-08-26 NOTE — Telephone Encounter (Signed)
Message copied by Maryan Puls on Tue Aug 26, 2013 10:40 AM ------      Message from: Louie Casa      Created: Mon Aug 25, 2013  1:37 PM      Regarding: Dr. Ivan Croft Work Note      Contact: [phone]       Patient called needs a return work note states he can come back to work on 09/01/13 and he can pick up the letter when is ready, please call him.            Thank you. ------

## 2013-09-02 IMAGING — CT CT ABD-PELV W/ CM
1 of 3 series · 13 of 32 positions shown, 18 images · IV contrast (OMNIPAQUE 300)
Comparison: 07/05/2012

CLINICAL DATA: Evaluate abdominal wall abscess. History of urachal
mass.

CT ABDOMEN AND PELVIS WITH CONTRAST
TECHNIQUE: Multidetector CT imaging of the abdomen and pelvis was
performed following the standard protocol during bolus
administration of intravenous contrast.
Contrast: 100mL OMNIPAQUE IOHEXOL 300 MG/ML  SOLN

[Series 2: abd/pel with · axial · 0.96mm/px · z∈[-458,-38]mm · 13 of 94 slices shown, 18 images]
[im 5/94  soft-tissue]
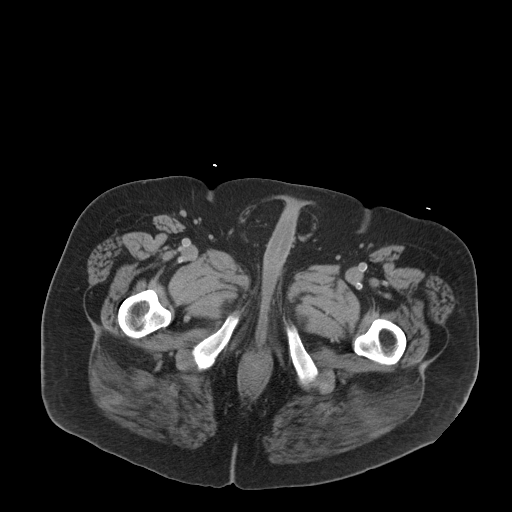
[im 5/94  bone]
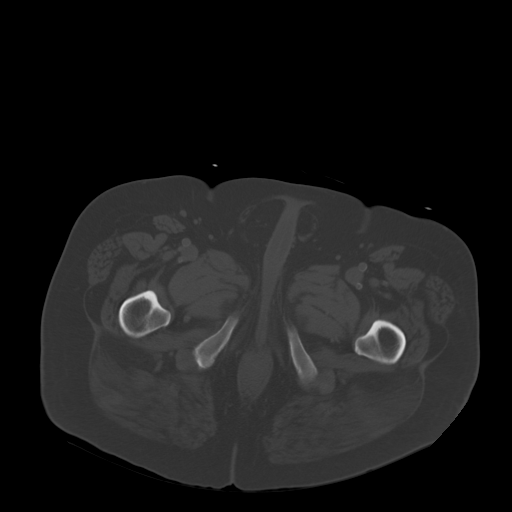
[im 15/94  soft-tissue]
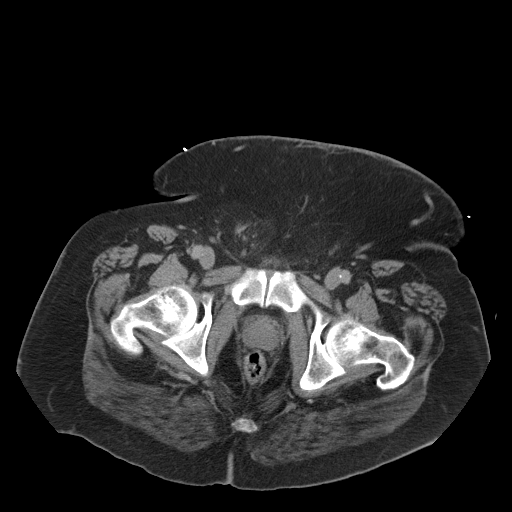
[im 20/94  soft-tissue]
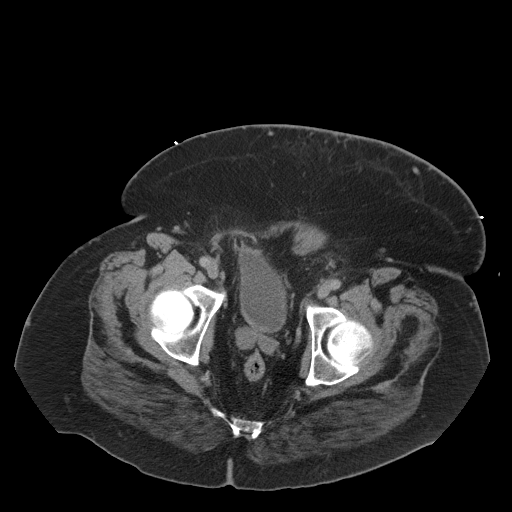
[im 30/94  soft-tissue]
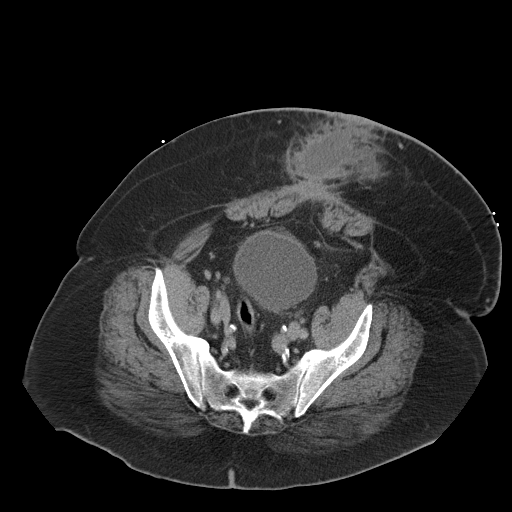
[im 35/94  soft-tissue]
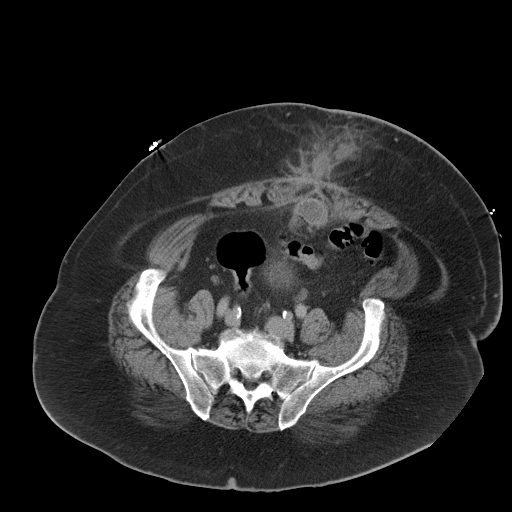
[im 45/94  soft-tissue]
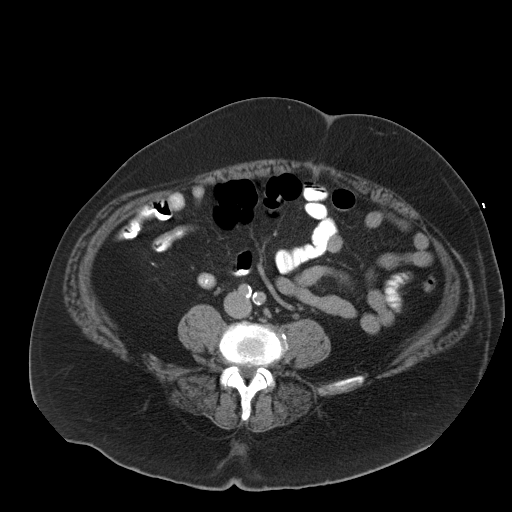
[im 49/94  soft-tissue]
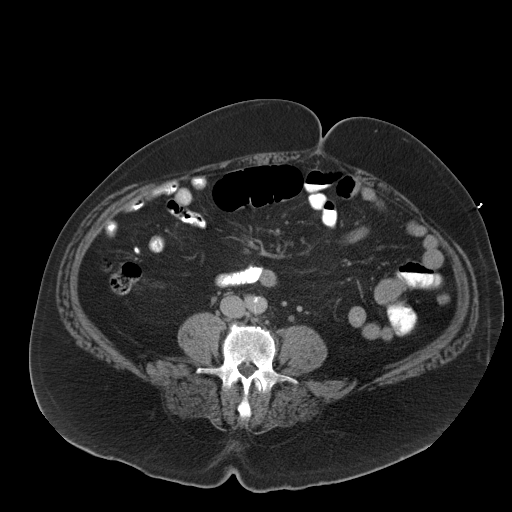
[im 59/94  soft-tissue]
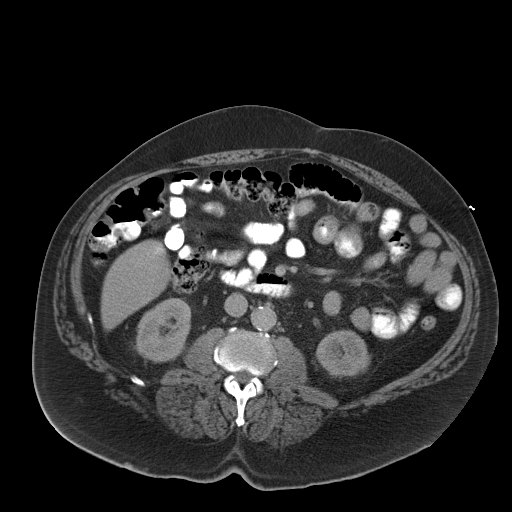
[im 64/94  soft-tissue]
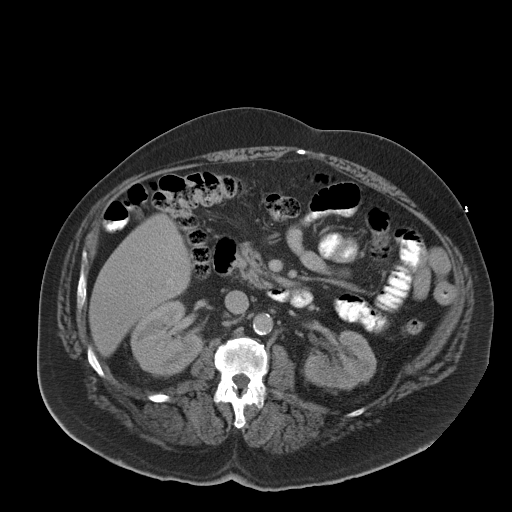
[im 64/94  bone]
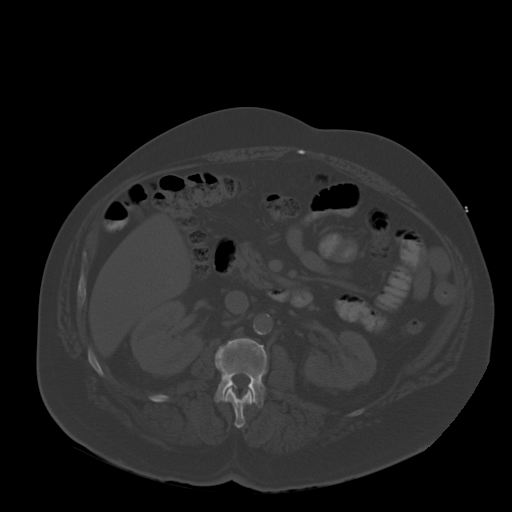
[im 74/94  soft-tissue]
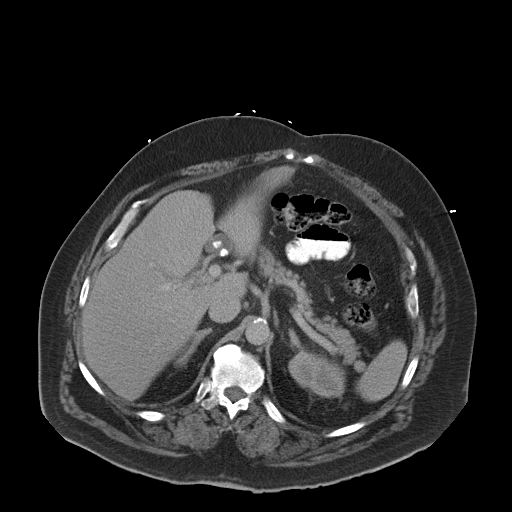
[im 74/94  lung]
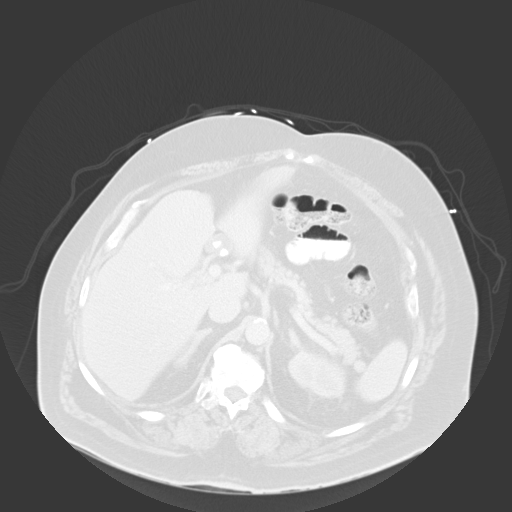
[im 79/94  soft-tissue]
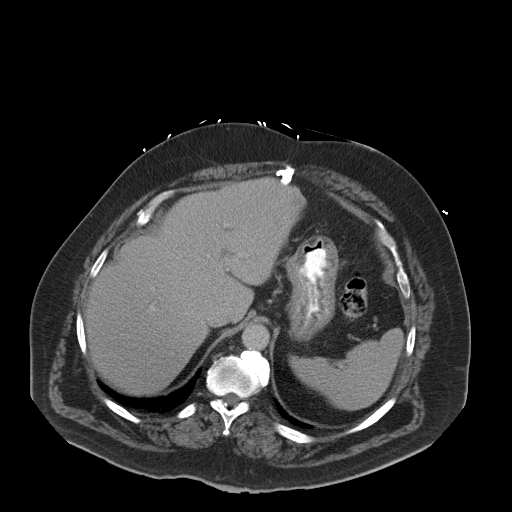
[im 79/94  lung]
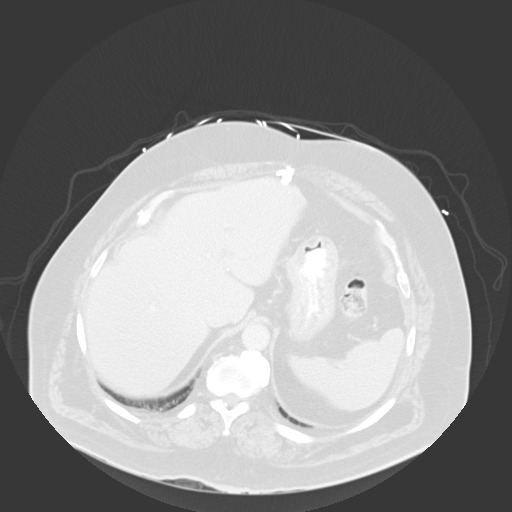
[im 84/94  lung]
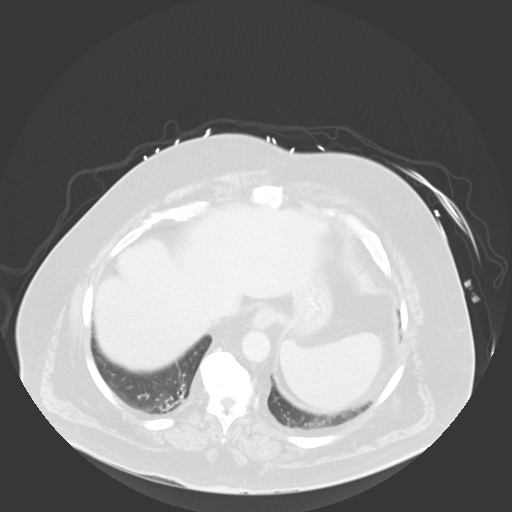
[im 89/94  soft-tissue]
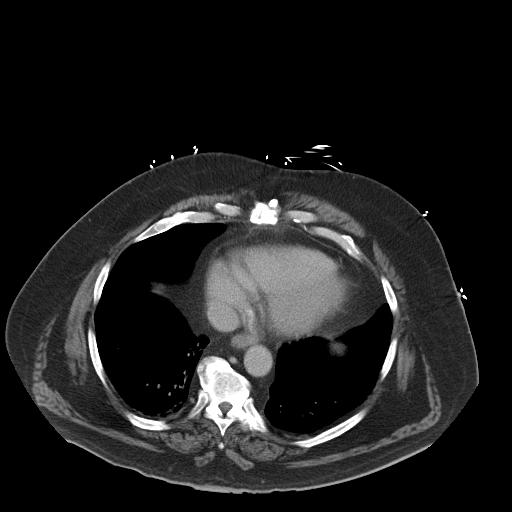
[im 89/94  lung]
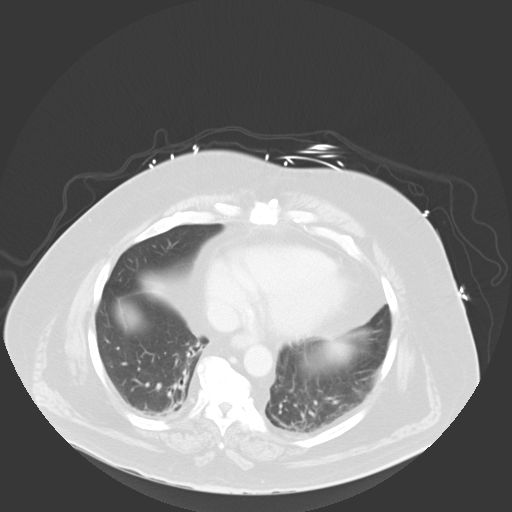

[13 of 32 positions shown; findings below may reference images not displayed]

FINDINGS: Mild volume loss at the lung bases.  There is no evidence
for free air.

The lower abdominal low density structure now measures 2.5 x 2.9 x
4.1 cm.  Previously this measured 2.2 x 2.0 x 2.3 cm.  There
continues to be inflammatory changes around this low density
structure.  There is increased inflammation and development of a
fluid collection in the soft tissues anterior to the intra-
abdominal collection.  Evidence a small connection or fistula tract
between the two collections.  The anterior soft tissue collection
measures 4.7 x 6.1 x 4.5 cm.  There is a large amount of
inflammation surrounding this anterior subcutaneous collection.
Findings are concerning for an abscess collection.

Again noted are multiple gallstones.  There is a normal appearance
of the liver, portal venous system, pancreas and spleen.  Normal
appearance of both adrenal glands and kidneys.  No gross
abnormality to the bladder, prostate or seminal vesicles.  There is
a small amount of soft tissue that connects anterior aspect of the
bladder to the anterior intra-abdominal collection.  The finding
would be consistent a urachal remnant.  Normal appearance of the
appendix.  There is no significant free fluid or lymphadenopathy.
There are bilateral inguinal hernias containing fat.  The right
anterior aspect of bladder extends into the right inguinal hernia
as well.  No acute bony abnormality.
IMPRESSION: Enlargement of the anterior intra-abdominal fluid collection with
surrounding inflammatory changes.  Based on the location, these
findings are suggestive for an infected urachal cyst.  Patient has
developed increased inflammatory changes in the anterior
subcutaneous tissues with a probable abscess collection measuring
up to 6.1 cm.  There appears to be a sinus tract or fistula
connection between these two fluid collections.

Cholelithiasis.

## 2013-09-08 ENCOUNTER — Other Ambulatory Visit: Payer: Self-pay | Admitting: Internal Medicine

## 2013-09-19 ENCOUNTER — Ambulatory Visit (INDEPENDENT_AMBULATORY_CARE_PROVIDER_SITE_OTHER): Payer: BC Managed Care – PPO | Admitting: Internal Medicine

## 2013-09-19 ENCOUNTER — Encounter: Payer: Self-pay | Admitting: Internal Medicine

## 2013-09-19 VITALS — BP 140/80 | Temp 98.0°F | Wt 282.0 lb

## 2013-09-19 DIAGNOSIS — E119 Type 2 diabetes mellitus without complications: Secondary | ICD-10-CM

## 2013-09-19 DIAGNOSIS — I2510 Atherosclerotic heart disease of native coronary artery without angina pectoris: Secondary | ICD-10-CM

## 2013-09-19 DIAGNOSIS — E785 Hyperlipidemia, unspecified: Secondary | ICD-10-CM

## 2013-09-19 LAB — HEMOGLOBIN A1C: Hgb A1c MFr Bld: 7 % — ABNORMAL HIGH (ref 4.6–6.5)

## 2013-09-19 MED ORDER — ATORVASTATIN CALCIUM 40 MG PO TABS: 40.0000 mg | ORAL_TABLET | Freq: Every morning | ORAL | Status: DC

## 2013-09-19 NOTE — Patient Instructions (Signed)
Limit your sodium (Salt) intake   Please check your hemoglobin A1c every 3 months    It is important that you exercise regularly, at least 20 minutes 3 to 4 times per week.  If you develop chest pain or shortness of breath seek  medical attention.  You need to lose weight.  Consider a lower calorie diet and regular exercise. 

## 2013-09-19 NOTE — Progress Notes (Signed)
Subjective:    Patient ID: [name], male    DOB: 03-Mar-1946, 67 y.o.   MRN: [id number]  HPI Pre-visit discussion using our clinic review tool. No additional management support is needed unless otherwise documented below in the visit note.  67 year old patient who is in today for followup of type 2 diabetes. He has gout coronary artery disease as well as hypertension and dyslipidemia. He denies any cardiopulmonary complaints. He admits to dietary noncompliance.  Wt Readings from Last 3 Encounters:  09/19/13 282 lb (127.914 kg)  08/15/13 278 lb 9.6 oz (126.372 kg)  07/29/13 294 lb 11.2 oz (133.675 kg)    Past Medical History  Diagnosis Date  . CAD, ARTERY BYPASS GRAFT 02/10/2008  . CAROTID ARTERY STENOSIS, WITHOUT INFARCTION 09/22/2008  . CORONARY ARTERY DISEASE 04/03/2008  . ERECTILE DYSFUNCTION 01/14/2009  . GOUT 01/16/2008  . HERPES ZOSTER 04/15/2010  . HYPERLIPIDEMIA 01/16/2008  . HYPERTENSION 01/16/2008  . IMPAIRED GLUCOSE TOLERANCE 08/22/2010  . Ulcer 30 yrs ago    stomach  . Shingles 2011    left chest  . Diabetes mellitus     "borderline"    History   Social History  . Marital Status: Married    Spouse Name: N/A    Number of Children: 3  . Years of Education: N/A   Occupational History  .  [employer]   Social History Main Topics  . Smoking status: Former Smoker -- 1.00 packs/day for 10 years    Types: Cigarettes    Quit date: 07/27/1999  . Smokeless tobacco: Never Used  . Alcohol Use: 0.0 oz/week     Comment: 4 beers a day  . Drug Use: No  . Sexual Activity: Not on file   Other Topics Concern  . Not on file   Social History Narrative  . No narrative on file    Past Surgical History  Procedure Laterality Date  . Partial gastrectomy  1974    bleeding ulcers  . Coronary artery bypass graft  2009    vessels x3  . Laparotomy  08/25/2012    Procedure: EXPLORATORY LAPAROTOMY;  Surgeon: Lodema Pilot, DO;  Location: [facility];  Service: General;   Laterality: N/A;  incision and drainage abdominal wall abcessand intra abdominal wall abcess  . Colonoscopy      3 months ago  . Laparoscopy N/A 07/29/2013    Procedure: LAPAROSCOPY DIAGNOSTIC  laparoscopic excision of urachal cyst ;  Surgeon: Lodema Pilot, DO;  Location: [facility];  Service: General;  Laterality: N/A;  . Ear cyst excision N/A 07/29/2013    Procedure: excision of urachal cyst flexible cystoscopy insertion of foley cath;  Surgeon: Antony Haste, MD;  Location: [facility];  Service: Urology;  Laterality: N/A;  . Urachal  2014    Urachal cyst removal    Family History  Problem Relation Age of Onset  . Heart disease Mother   . Heart disease Sister   . Colon cancer Neg Hx     No Known Allergies  Current Outpatient Prescriptions on File Prior to Visit  Medication Sig Dispense Refill  . allopurinol (ZYLOPRIM) 100 MG tablet TAKE 1 TABLET BY MOUTH EVERY DAY  90 tablet  1  . amLODipine (NORVASC) 10 MG tablet TAKE ONE TABLET BY MOUTH DAILY  90 tablet  1  . aspirin EC 325 MG tablet Take 325 mg by mouth every morning.       . Cholecalciferol (VITAMIN D3) 1000 UNITS CAPS Take 1 capsule  by mouth daily.      Marland Kitchen glipiZIDE (GLUCOTROL XL) 2.5 MG 24 hr tablet Take 2.5 mg by mouth every evening.      Marland Kitchen glucose blood (ONE TOUCH ULTRA TEST) test strip 1 each by Other route daily. Use as instructed  100 each  12  . HYDROcodone-acetaminophen (NORCO/VICODIN) 5-325 MG per tablet Take 1 tablet by mouth every 4 (four) hours as needed for pain.  40 tablet  0  . ibuprofen (ADVIL,MOTRIN) 200 MG tablet Take 400 mg by mouth every 6 (six) hours as needed. For pain      . Lancets (ONETOUCH ULTRASOFT) lancets 1 each by Other route daily. Use as instructed  100 each  12  . metFORMIN (GLUCOPHAGE) 1000 MG tablet Take 1,000 mg by mouth 2 (two) times daily with a meal.      . metoprolol (LOPRESSOR) 50 MG tablet Take 50 mg by mouth 2 (two) times daily.      . Omega 3-6-9 Fatty Acids (OMEGA-3 & OMEGA-6 FISH  OIL PO) Take 1,000 mg by mouth daily.        No current facility-administered medications on file prior to visit.    BP 140/80  Temp(Src) 98 F (36.7 C) (Oral)  Wt 282 lb (127.914 kg)      Review of Systems  Constitutional: Negative for fever, chills, appetite change and fatigue.  HENT: Negative for congestion, dental problem, ear pain, hearing loss, sore throat, tinnitus, trouble swallowing and voice change.   Eyes: Negative for pain, discharge and visual disturbance.  Respiratory: Negative for cough, chest tightness, wheezing and stridor.   Cardiovascular: Negative for chest pain, palpitations and leg swelling.  Gastrointestinal: Negative for nausea, vomiting, abdominal pain, diarrhea, constipation, blood in stool and abdominal distention.  Genitourinary: Negative for urgency, hematuria, flank pain, discharge, difficulty urinating and genital sores.  Musculoskeletal: Negative for arthralgias, back pain, gait problem, joint swelling, myalgias and neck stiffness.  Skin: Negative for rash.  Neurological: Negative for dizziness, syncope, speech difficulty, weakness, numbness and headaches.  Hematological: Negative for adenopathy. Does not bruise/bleed easily.  Psychiatric/Behavioral: Negative for behavioral problems and dysphoric mood. The patient is not nervous/anxious.        Objective:   Physical Exam  Constitutional: He is oriented to person, place, and time. He appears well-developed.  Repeat blood pressure improved to 122/78  Weight 282  HENT:  Head: Normocephalic.  Right Ear: External ear normal.  Left Ear: External ear normal.  Eyes: Conjunctivae and EOM are normal.  Neck: Normal range of motion.  Cardiovascular: Normal rate and normal heart sounds.   Pulmonary/Chest: Breath sounds normal.  Abdominal: Soft. Bowel sounds are normal.  Musculoskeletal: Normal range of motion. He exhibits no edema and no tenderness.  Neurological: He is alert and oriented to person,  place, and time.  Psychiatric: He has a normal mood and affect. His behavior is normal.          Assessment & Plan:   Diabetes mellitus  we'll check a hemoglobin A1c Hypertension well controlled CAD. Stable Exogenous obesity. Weight loss encouraged Dyslipidemia. Lipitor as that and refilled  No change in medication Recheck 3 months

## 2013-10-08 ENCOUNTER — Other Ambulatory Visit: Payer: Self-pay | Admitting: *Deleted

## 2013-10-08 MED ORDER — TRAMADOL HCL 50 MG PO TABS: 50.0000 mg | ORAL_TABLET | Freq: Four times a day (QID) | ORAL | Status: DC | PRN

## 2013-10-23 ENCOUNTER — Other Ambulatory Visit: Payer: Self-pay | Admitting: Internal Medicine

## 2013-11-04 ENCOUNTER — Other Ambulatory Visit: Payer: Self-pay | Admitting: Internal Medicine

## 2013-11-21 ENCOUNTER — Telehealth: Payer: Self-pay | Admitting: Internal Medicine

## 2013-11-21 MED ORDER — NITROGLYCERIN 0.4 MG SL SUBL: 0.4000 mg | SUBLINGUAL_TABLET | SUBLINGUAL | Status: DC | PRN

## 2013-11-21 NOTE — Telephone Encounter (Signed)
Pt is requesting a refill of a discontinued medication : nitroGLYCERIN (NITROSTAT) 0.4 MG SL tablet. Pt states that nothing is wrong but he likes to have these on hand.  Wants to know can this be called into his [facility] on [address]. Please advise.

## 2013-11-21 NOTE — Telephone Encounter (Signed)
Rx sent to pharmacy   

## 2013-12-18 ENCOUNTER — Encounter: Payer: Self-pay | Admitting: *Deleted

## 2013-12-19 ENCOUNTER — Ambulatory Visit (INDEPENDENT_AMBULATORY_CARE_PROVIDER_SITE_OTHER): Payer: BC Managed Care – PPO | Admitting: Internal Medicine

## 2013-12-19 ENCOUNTER — Encounter: Payer: Self-pay | Admitting: Internal Medicine

## 2013-12-19 VITALS — BP 130/80 | HR 76 | Temp 98.1°F | Wt 289.0 lb

## 2013-12-19 DIAGNOSIS — E739 Lactose intolerance, unspecified: Secondary | ICD-10-CM

## 2013-12-19 DIAGNOSIS — I6529 Occlusion and stenosis of unspecified carotid artery: Secondary | ICD-10-CM

## 2013-12-19 DIAGNOSIS — E785 Hyperlipidemia, unspecified: Secondary | ICD-10-CM

## 2013-12-19 DIAGNOSIS — E119 Type 2 diabetes mellitus without complications: Secondary | ICD-10-CM

## 2013-12-19 DIAGNOSIS — I10 Essential (primary) hypertension: Secondary | ICD-10-CM

## 2013-12-19 LAB — HEMOGLOBIN A1C: Hgb A1c MFr Bld: 7 % — ABNORMAL HIGH (ref 4.6–6.5)

## 2013-12-19 MED ORDER — LISINOPRIL 10 MG PO TABS: 10.0000 mg | ORAL_TABLET | Freq: Every day | ORAL | Status: DC

## 2013-12-19 NOTE — Progress Notes (Signed)
Pre visit review using our clinic review tool, if applicable. No additional management support is needed unless otherwise documented below in the visit note. 

## 2013-12-19 NOTE — Patient Instructions (Signed)
Limit your sodium (Salt) intake  You need to lose weight.  Consider a lower calorie diet and regular exercise.    It is important that you exercise regularly, at least 20 minutes 3 to 4 times per week.  If you develop chest pain or shortness of breath seek  medical attention.   Please check your hemoglobin A1c every 3 months    It is important that you exercise regularly, at least 20 minutes 3 to 4 times per week.  If you develop chest pain or shortness of breath seek  medical attention.

## 2013-12-19 NOTE — Progress Notes (Signed)
Subjective:    Patient ID: [name], male    DOB: [birth year], 68 y.o.   MRN: [id number]  HPI  68 year old patient who is seen today for his quarterly followup of type 2 diabetes.  His last hemoglobin A1c 7 point 0.  He has a history of coronary artery disease and is status post CABG.  The past week.  He has had some intermittent tingling involving the left arm and shoulder area.  No motor weakness or facial symptoms.  Yesterday her hypertension and dyslipidemia.  He also has a history of carotid artery stenosis of moderate severity.  The plan was to repeat vascular studies in 6 months.  Otherwise, doing well.  Wt Readings from Last 3 Encounters:  12/19/13 289 lb (131.09 kg)  09/19/13 282 lb (127.914 kg)  08/15/13 278 lb 9.6 oz (126.372 kg)    Past Medical History  Diagnosis Date  . CAD, ARTERY BYPASS GRAFT 02/10/2008  . CAROTID ARTERY STENOSIS, WITHOUT INFARCTION 09/22/2008  . CORONARY ARTERY DISEASE 04/03/2008  . ERECTILE DYSFUNCTION 01/14/2009  . GOUT 01/16/2008  . HERPES ZOSTER 04/15/2010  . HYPERLIPIDEMIA 01/16/2008  . HYPERTENSION 01/16/2008  . IMPAIRED GLUCOSE TOLERANCE 08/22/2010  . Ulcer 30 yrs ago    stomach  . Shingles 2011    left chest  . Diabetes mellitus     "borderline"    History   Social History  . Marital Status: Married    Spouse Name: N/A    Number of Children: 3  . Years of Education: N/A   Occupational History  .  [employer]   Social History Main Topics  . Smoking status: Former Smoker -- 1.00 packs/day for 10 years    Types: Cigarettes    Quit date: 07/27/1999  . Smokeless tobacco: Never Used  . Alcohol Use: 0.0 oz/week     Comment: 4 beers a day  . Drug Use: No  . Sexual Activity: Not on file   Other Topics Concern  . Not on file   Social History Narrative  . No narrative on file    Past Surgical History  Procedure Laterality Date  . Partial gastrectomy  1974    bleeding ulcers  . Coronary artery bypass graft  2009   vessels x3  . Laparotomy  08/25/2012    Procedure: EXPLORATORY LAPAROTOMY;  Surgeon: Madilyn Hook, DO;  Location: [facility];  Service: General;  Laterality: N/A;  incision and drainage abdominal wall abcessand intra abdominal wall abcess  . Colonoscopy      3 months ago  . Laparoscopy N/A 07/29/2013    Procedure: LAPAROSCOPY DIAGNOSTIC  laparoscopic excision of urachal cyst ;  Surgeon: Madilyn Hook, DO;  Location: [facility];  Service: General;  Laterality: N/A;  . Ear cyst excision N/A 07/29/2013    Procedure: excision of urachal cyst flexible cystoscopy insertion of foley cath;  Surgeon: Fredricka Bonine, MD;  Location: [facility];  Service: Urology;  Laterality: N/A;  . Urachal  2014    Urachal cyst removal    Family History  Problem Relation Age of Onset  . Heart disease Mother   . Heart disease Sister   . Colon cancer Neg Hx     No Known Allergies  Current Outpatient Prescriptions on File Prior to Visit  Medication Sig Dispense Refill  . allopurinol (ZYLOPRIM) 100 MG tablet TAKE 1 TABLET BY MOUTH EVERY DAY  90 tablet  1  . amLODipine (NORVASC) 10 MG tablet TAKE ONE TABLET BY  MOUTH DAILY  90 tablet  1  . aspirin EC 325 MG tablet Take 325 mg by mouth every morning.       Marland Kitchen atorvastatin (LIPITOR) 40 MG tablet Take 1 tablet (40 mg total) by mouth every morning.  90 tablet  6  . Cholecalciferol (VITAMIN D3) 1000 UNITS CAPS Take 1 capsule by mouth daily.      Marland Kitchen glipiZIDE (GLUCOTROL XL) 2.5 MG 24 hr tablet Take 2.5 mg by mouth every evening.      Marland Kitchen glucose blood (ONE TOUCH ULTRA TEST) test strip 1 each by Other route daily. Use as instructed  100 each  12  . HYDROcodone-acetaminophen (NORCO/VICODIN) 5-325 MG per tablet Take 1 tablet by mouth every 4 (four) hours as needed for pain.  40 tablet  0  . ibuprofen (ADVIL,MOTRIN) 200 MG tablet Take 400 mg by mouth every 6 (six) hours as needed. For pain      . Lancets (ONETOUCH ULTRASOFT) lancets 1 each by Other route daily. Use as instructed  100  each  12  . metFORMIN (GLUCOPHAGE) 1000 MG tablet Take 1,000 mg by mouth 2 (two) times daily with a meal.      . metoprolol (LOPRESSOR) 50 MG tablet TAKE 1 TABLET TWICE A DAY  60 tablet  5  . nitroGLYCERIN (NITROSTAT) 0.4 MG SL tablet Place 1 tablet (0.4 mg total) under the tongue every 5 (five) minutes as needed for chest pain.  30 tablet  12  . Omega 3-6-9 Fatty Acids (OMEGA-3 & OMEGA-6 FISH OIL PO) Take 1,000 mg by mouth daily.       . traMADol (ULTRAM) 50 MG tablet TAKE 1 TABLET EVERY 6 HOURS AS NEEDED FOR PAIN  60 tablet  3   No current facility-administered medications on file prior to visit.    BP 130/80  Pulse 76  Temp(Src) 98.1 F (36.7 C) (Oral)  Wt 289 lb (131.09 kg)  SpO2 95%       Review of Systems  Constitutional: Negative for fever, chills, appetite change and fatigue.  HENT: Negative for congestion, dental problem, ear pain, hearing loss, sore throat, tinnitus, trouble swallowing and voice change.   Eyes: Negative for pain, discharge and visual disturbance.  Respiratory: Negative for cough, chest tightness, wheezing and stridor.   Cardiovascular: Negative for chest pain, palpitations and leg swelling.  Gastrointestinal: Negative for nausea, vomiting, abdominal pain, diarrhea, constipation, blood in stool and abdominal distention.  Genitourinary: Negative for urgency, hematuria, flank pain, discharge, difficulty urinating and genital sores.  Musculoskeletal: Negative for arthralgias, back pain, gait problem, joint swelling, myalgias and neck stiffness.  Skin: Negative for rash.  Neurological: Negative for dizziness, syncope, speech difficulty, weakness, numbness (Tingling, paresthesias, left arm) and headaches.  Hematological: Negative for adenopathy. Does not bruise/bleed easily.  Psychiatric/Behavioral: Negative for behavioral problems and dysphoric mood. The patient is not nervous/anxious.        Objective:   Physical Exam  Constitutional: He is oriented to  person, place, and time. He appears well-developed.  HENT:  Head: Normocephalic.  Right Ear: External ear normal.  Left Ear: External ear normal.  Eyes: Conjunctivae and EOM are normal.  Neck: Normal range of motion.  Cardiovascular: Normal rate and normal heart sounds.   Pulmonary/Chest: Breath sounds normal.  Abdominal: Bowel sounds are normal.  Musculoskeletal: Normal range of motion. He exhibits no edema and no tenderness.  Neurological: He is alert and oriented to person, place, and time. No cranial nerve deficit. Coordination normal.  No drift of the outstretched arms No obvious sensory abnormality of the left arm  Psychiatric: He has a normal mood and affect. His behavior is normal.          Assessment & Plan:   Intermittent paresthesias left arm  Carotid artery stenosis.  We'll check a follow carotid artery.  Doppler study  Hypertension well controlled  CAD  Diabetes  Weight gain  We'll let lisinopril to his medical regimen  Check hemoglobin A1c  Low salt diet, weight loss.  All encouraged

## 2013-12-22 ENCOUNTER — Telehealth: Payer: Self-pay | Admitting: Internal Medicine

## 2013-12-22 NOTE — Telephone Encounter (Signed)
Relevant patient education mailed to patient.  

## 2014-01-05 ENCOUNTER — Ambulatory Visit: Payer: BC Managed Care – PPO | Admitting: Cardiovascular Disease

## 2014-01-27 ENCOUNTER — Other Ambulatory Visit: Payer: Self-pay | Admitting: Internal Medicine

## 2014-04-09 ENCOUNTER — Other Ambulatory Visit: Payer: Self-pay | Admitting: Internal Medicine

## 2014-04-16 ENCOUNTER — Other Ambulatory Visit: Payer: Self-pay | Admitting: Internal Medicine

## 2014-04-20 ENCOUNTER — Other Ambulatory Visit: Payer: Self-pay | Admitting: Internal Medicine

## 2014-06-16 ENCOUNTER — Encounter: Payer: Self-pay | Admitting: Internal Medicine

## 2014-06-16 ENCOUNTER — Ambulatory Visit (INDEPENDENT_AMBULATORY_CARE_PROVIDER_SITE_OTHER): Payer: BC Managed Care – PPO | Admitting: Internal Medicine

## 2014-06-16 VITALS — BP 120/80 | HR 70 | Temp 98.0°F | Resp 20 | Ht 70.0 in | Wt 273.0 lb

## 2014-06-16 DIAGNOSIS — E785 Hyperlipidemia, unspecified: Secondary | ICD-10-CM

## 2014-06-16 DIAGNOSIS — I2510 Atherosclerotic heart disease of native coronary artery without angina pectoris: Secondary | ICD-10-CM

## 2014-06-16 DIAGNOSIS — I10 Essential (primary) hypertension: Secondary | ICD-10-CM

## 2014-06-16 DIAGNOSIS — E119 Type 2 diabetes mellitus without complications: Secondary | ICD-10-CM

## 2014-06-16 LAB — LIPID PANEL
Cholesterol: 185 mg/dL (ref 0–200)
HDL: 54.1 mg/dL (ref >39.00)
LDL Cholesterol: 116 mg/dL — ABNORMAL HIGH (ref 0–99)
NONHDL: 130.9
Total CHOL/HDL Ratio: 3
Triglycerides: 77 mg/dL (ref 0.0–149.0)
VLDL: 15.4 mg/dL (ref 0.0–40.0)

## 2014-06-16 LAB — HEMOGLOBIN A1C: HEMOGLOBIN A1C: 7 % — AB (ref 4.6–6.5)

## 2014-06-16 MED ORDER — GLIPIZIDE ER 2.5 MG PO TB24: 2.5000 mg | ORAL_TABLET | Freq: Every evening | ORAL | Status: DC

## 2014-06-16 MED ORDER — METFORMIN HCL 1000 MG PO TABS: 1000.0000 mg | ORAL_TABLET | Freq: Two times a day (BID) | ORAL | Status: DC

## 2014-06-16 NOTE — Patient Instructions (Signed)
Limit your sodium (Salt) intake  Return in 6 months for follow-up     It is important that you exercise regularly, at least 20 minutes 3 to 4 times per week.  If you develop chest pain or shortness of breath seek  medical attention.  You need to lose weight.  Consider a lower calorie diet and regular exercise. 

## 2014-06-16 NOTE — Progress Notes (Signed)
Pre visit review using our clinic review tool, if applicable. No additional management support is needed unless otherwise documented below in the visit note. 

## 2014-06-16 NOTE — Progress Notes (Signed)
Subjective:    Patient ID: [name], male    DOB: 1945-11-27, 68 y.o.   MRN: [id number]  HPI  68 year old patient who is in today for his six-month followup.  He has history of gout and recently has had some left knee discomfort, which has largely resolved.  He had been off allopurinol briefly.  He has a history of dyslipidemia, hypertension, coronary artery disease.  He is being treated for type 2 diabetes.  He was hospitalized in the fall and underwent a urological procedure.  Otherwise done quite well today.  Denies any cardiopulmonary complaints or any focal neurological complaints  Past Medical History  Diagnosis Date  . CAD, ARTERY BYPASS GRAFT 02/10/2008  . CAROTID ARTERY STENOSIS, WITHOUT INFARCTION 09/22/2008  . CORONARY ARTERY DISEASE 04/03/2008  . ERECTILE DYSFUNCTION 01/14/2009  . GOUT 01/16/2008  . HERPES ZOSTER 04/15/2010  . HYPERLIPIDEMIA 01/16/2008  . HYPERTENSION 01/16/2008  . IMPAIRED GLUCOSE TOLERANCE 08/22/2010  . Ulcer 30 yrs ago    stomach  . Shingles 2011    left chest  . Diabetes mellitus     "borderline"    History   Social History  . Marital Status: Married    Spouse Name: N/A    Number of Children: 3  . Years of Education: N/A   Occupational History  .  [employer]   Social History Main Topics  . Smoking status: Former Smoker -- 1.00 packs/day for 10 years    Types: Cigarettes    Quit date: 07/27/1999  . Smokeless tobacco: Never Used  . Alcohol Use: 0.0 oz/week     Comment: 4 beers a day  . Drug Use: No  . Sexual Activity: Not on file   Other Topics Concern  . Not on file   Social History Narrative  . No narrative on file    Past Surgical History  Procedure Laterality Date  . Partial gastrectomy  1974    bleeding ulcers  . Coronary artery bypass graft  2009    vessels x3  . Laparotomy  08/25/2012    Procedure: EXPLORATORY LAPAROTOMY;  Surgeon: Madilyn Hook, DO;  Location: [facility];  Service: General;  Laterality: N/A;  incision  and drainage abdominal wall abcessand intra abdominal wall abcess  . Colonoscopy      3 months ago  . Laparoscopy N/A 07/29/2013    Procedure: LAPAROSCOPY DIAGNOSTIC  laparoscopic excision of urachal cyst ;  Surgeon: Madilyn Hook, DO;  Location: [facility];  Service: General;  Laterality: N/A;  . Ear cyst excision N/A 07/29/2013    Procedure: excision of urachal cyst flexible cystoscopy insertion of foley cath;  Surgeon: Fredricka Bonine, MD;  Location: [facility];  Service: Urology;  Laterality: N/A;  . Urachal  2014    Urachal cyst removal    Family History  Problem Relation Age of Onset  . Heart disease Mother   . Heart disease Sister   . Colon cancer Neg Hx     No Known Allergies  Current Outpatient Prescriptions on File Prior to Visit  Medication Sig Dispense Refill  . allopurinol (ZYLOPRIM) 100 MG tablet TAKE 1 TABLET BY MOUTH EVERY DAY  90 tablet  3  . amLODipine (NORVASC) 10 MG tablet TAKE ONE TABLET BY MOUTH DAILY  90 tablet  1  . aspirin EC 325 MG tablet Take 325 mg by mouth every morning.       Marland Kitchen atorvastatin (LIPITOR) 40 MG tablet Take 1 tablet (40 mg total)  by mouth every morning.  90 tablet  6  . Cholecalciferol (VITAMIN D3) 1000 UNITS CAPS Take 1 capsule by mouth daily.      Marland Kitchen glucose blood (ONE TOUCH ULTRA TEST) test strip 1 each by Other route daily. Use as instructed  100 each  12  . ibuprofen (ADVIL,MOTRIN) 200 MG tablet Take 400 mg by mouth every 6 (six) hours as needed. For pain      . Lancets (ONETOUCH ULTRASOFT) lancets 1 each by Other route daily. Use as instructed  100 each  12  . lisinopril (PRINIVIL,ZESTRIL) 10 MG tablet Take 1 tablet (10 mg total) by mouth daily.  90 tablet  3  . metoprolol (LOPRESSOR) 50 MG tablet TAKE 1 TABLET TWICE A DAY  60 tablet  5  . nitroGLYCERIN (NITROSTAT) 0.4 MG SL tablet Place 1 tablet (0.4 mg total) under the tongue every 5 (five) minutes as needed for chest pain.  30 tablet  12  . Omega 3-6-9 Fatty Acids (OMEGA-3 & OMEGA-6 FISH  OIL PO) Take 1,000 mg by mouth daily.       . traMADol (ULTRAM) 50 MG tablet TAKE 1 TABLET EVERY 6 HOURS AS NEEDED FOR PAIN  60 tablet  3   No current facility-administered medications on file prior to visit.    BP 120/80  Pulse 70  Temp(Src) 98 F (36.7 C) (Oral)  Resp 20  Ht 5\' 10"  (1.778 m)  Wt 273 lb (123.832 kg)  BMI 39.17 kg/m2  SpO2 97%       Review of Systems  Constitutional: Negative for fever, chills, appetite change and fatigue.  HENT: Negative for congestion, dental problem, ear pain, hearing loss, sore throat, tinnitus, trouble swallowing and voice change.   Eyes: Negative for pain, discharge and visual disturbance.  Respiratory: Negative for cough, chest tightness, wheezing and stridor.   Cardiovascular: Negative for chest pain, palpitations and leg swelling.  Gastrointestinal: Negative for nausea, vomiting, abdominal pain, diarrhea, constipation, blood in stool and abdominal distention.  Genitourinary: Negative for urgency, hematuria, flank pain, discharge, difficulty urinating and genital sores.  Musculoskeletal: Positive for arthralgias and joint swelling. Negative for back pain, gait problem, myalgias and neck stiffness.  Skin: Negative for rash.  Neurological: Negative for dizziness, syncope, speech difficulty, weakness, numbness and headaches.  Hematological: Negative for adenopathy. Does not bruise/bleed easily.  Psychiatric/Behavioral: Negative for behavioral problems and dysphoric mood. The patient is not nervous/anxious.        Objective:   Physical Exam  Constitutional: He is oriented to person, place, and time. He appears well-developed.  HENT:  Head: Normocephalic.  Right Ear: External ear normal.  Left Ear: External ear normal.  Eyes: Conjunctivae and EOM are normal.  Neck: Normal range of motion.  Cardiovascular: Normal rate and normal heart sounds.   Pulmonary/Chest: Breath sounds normal.  Abdominal: Bowel sounds are normal.    Musculoskeletal: Normal range of motion. He exhibits no edema and no tenderness.  Neurological: He is alert and oriented to person, place, and time.  Psychiatric: He has a normal mood and affect. His behavior is normal.          Assessment & Plan:  Diabetes mellitus.  We'll check a hemoglobin A1c Coronary artery disease.  Continue statin therapy Hypertension well controlled Left knee pain.  Probable resolving gout.  Compliance stressed  CPX 6 months

## 2014-06-23 ENCOUNTER — Other Ambulatory Visit (INDEPENDENT_AMBULATORY_CARE_PROVIDER_SITE_OTHER): Payer: BC Managed Care – PPO

## 2014-06-23 DIAGNOSIS — Z0000 Encounter for general adult medical examination without abnormal findings: Secondary | ICD-10-CM

## 2014-06-23 LAB — BASIC METABOLIC PANEL
BUN: 21 mg/dL (ref 6–23)
CO2: 24 mEq/L (ref 19–32)
CREATININE: 1.1 mg/dL (ref 0.4–1.5)
Calcium: 9.9 mg/dL (ref 8.4–10.5)
Chloride: 107 mEq/L (ref 96–112)
GFR: 88.96 mL/min (ref >60.00)
Glucose, Bld: 119 mg/dL — ABNORMAL HIGH (ref 70–99)
Potassium: 5 mEq/L (ref 3.5–5.1)
Sodium: 138 mEq/L (ref 135–145)

## 2014-06-23 LAB — TSH: TSH: 1.25 u[IU]/mL (ref 0.35–4.50)

## 2014-06-23 LAB — CBC WITH DIFFERENTIAL/PLATELET
BASOS ABS: 0 10*3/uL (ref 0.0–0.1)
Basophils Relative: 0.5 % (ref 0.0–3.0)
EOS ABS: 0.1 10*3/uL (ref 0.0–0.7)
Eosinophils Relative: 1.8 % (ref 0.0–5.0)
HEMATOCRIT: 36.3 % — AB (ref 39.0–52.0)
HEMOGLOBIN: 12.3 g/dL — AB (ref 13.0–17.0)
LYMPHS ABS: 1.2 10*3/uL (ref 0.7–4.0)
Lymphocytes Relative: 18.4 % (ref 12.0–46.0)
MCHC: 33.9 g/dL (ref 30.0–36.0)
MCV: 92.5 fl (ref 78.0–100.0)
Monocytes Absolute: 0.6 10*3/uL (ref 0.1–1.0)
Monocytes Relative: 8.7 % (ref 3.0–12.0)
NEUTROS ABS: 4.6 10*3/uL (ref 1.4–7.7)
Neutrophils Relative %: 70.6 % (ref 43.0–77.0)
Platelets: 215 10*3/uL (ref 150.0–400.0)
RBC: 3.93 Mil/uL — ABNORMAL LOW (ref 4.22–5.81)
RDW: 14.8 % (ref 11.5–15.5)
WBC: 6.6 10*3/uL (ref 4.0–10.5)

## 2014-06-23 LAB — HEPATIC FUNCTION PANEL
ALK PHOS: 65 U/L (ref 39–117)
ALT: 24 U/L (ref 0–53)
AST: 23 U/L (ref 0–37)
Albumin: 3.2 g/dL — ABNORMAL LOW (ref 3.5–5.2)
BILIRUBIN DIRECT: 0.1 mg/dL (ref 0.0–0.3)
Total Bilirubin: 0.4 mg/dL (ref 0.2–1.2)
Total Protein: 7.2 g/dL (ref 6.0–8.3)

## 2014-06-23 LAB — MICROALBUMIN / CREATININE URINE RATIO
Creatinine,U: 51 mg/dL
Microalb Creat Ratio: 9 mg/g (ref 0.0–30.0)
Microalb, Ur: 4.6 mg/dL — ABNORMAL HIGH (ref 0.0–1.9)

## 2014-06-23 LAB — PSA: PSA: 0.31 ng/mL (ref 0.10–4.00)

## 2014-06-26 ENCOUNTER — Other Ambulatory Visit: Payer: Self-pay | Admitting: Internal Medicine

## 2014-06-30 ENCOUNTER — Other Ambulatory Visit: Payer: Self-pay | Admitting: Internal Medicine

## 2014-06-30 ENCOUNTER — Encounter: Payer: Self-pay | Admitting: Internal Medicine

## 2014-06-30 ENCOUNTER — Ambulatory Visit (INDEPENDENT_AMBULATORY_CARE_PROVIDER_SITE_OTHER): Payer: BC Managed Care – PPO | Admitting: Internal Medicine

## 2014-06-30 ENCOUNTER — Encounter: Payer: Self-pay | Admitting: *Deleted

## 2014-06-30 VITALS — BP 140/70 | HR 73 | Temp 98.4°F | Resp 20 | Ht 69.5 in | Wt 272.0 lb

## 2014-06-30 DIAGNOSIS — E119 Type 2 diabetes mellitus without complications: Secondary | ICD-10-CM

## 2014-06-30 DIAGNOSIS — I6529 Occlusion and stenosis of unspecified carotid artery: Secondary | ICD-10-CM

## 2014-06-30 DIAGNOSIS — E785 Hyperlipidemia, unspecified: Secondary | ICD-10-CM

## 2014-06-30 DIAGNOSIS — M109 Gout, unspecified: Secondary | ICD-10-CM

## 2014-06-30 DIAGNOSIS — I10 Essential (primary) hypertension: Secondary | ICD-10-CM

## 2014-06-30 DIAGNOSIS — I2510 Atherosclerotic heart disease of native coronary artery without angina pectoris: Secondary | ICD-10-CM

## 2014-06-30 MED ORDER — TRAMADOL HCL 50 MG PO TABS: ORAL_TABLET | ORAL | Status: DC

## 2014-06-30 NOTE — Progress Notes (Signed)
Pre visit review using our clinic review tool, if applicable. No additional management support is needed unless otherwise documented below in the visit note. 

## 2014-06-30 NOTE — Patient Instructions (Signed)
Limit your sodium (Salt) intake    It is important that you exercise regularly, at least 20 minutes 3 to 4 times per week.  If you develop chest pain or shortness of breath seek  medical attention.  You need to lose weight.  Consider a lower calorie diet and regular exercise.  Cardiac Diet This diet can help prevent heart disease and stroke. Many factors influence your heart health, including eating and exercise habits. Coronary risk rises a lot with abnormal blood fat (lipid) levels. Cardiac meal planning includes limiting unhealthy fats, increasing healthy fats, and making other small dietary changes. General guidelines are as follows:  Adjust calorie intake to reach and maintain desirable body weight.  Limit total fat intake to less than 30% of total calories. Saturated fat should be less than 7% of calories.  Saturated fats are found in animal products and in some vegetable products. Saturated vegetable fats are found in coconut oil, cocoa butter, palm oil, and palm kernel oil. Read labels carefully to avoid these products as much as possible. Use butter in moderation. Choose tub margarines and oils that have 2 grams of fat or less. Good cooking oils are canola and olive oils.  Practice low-fat cooking techniques. Do not fry food. Instead, broil, bake, boil, steam, grill, roast on a rack, stir-fry, or microwave it. Other fat reducing suggestions include:  Remove the skin from poultry.  Remove all visible fat from meats.  Skim the fat off stews, soups, and gravies before serving them.  Steam vegetables in water or broth instead of sauting them in fat.  Avoid foods with trans fat (or hydrogenated oils), such as commercially fried foods and commercially baked goods. Commercial shortening and deep-frying fats will contain trans fat.  Increase intake of fruits, vegetables, whole grains, and legumes to replace foods high in fat.  Increase consumption of nuts, legumes, and seeds to at  least 4 servings weekly. One serving of a legume equals  cup, and 1 serving of nuts or seeds equals  cup.  Choose whole grains more often. Have 3 servings per day (a serving is 1 ounce [oz]).  Eat 4 to 5 servings of vegetables per day. A serving of vegetables is 1 cup of raw leafy vegetables;  cup of raw or cooked cut-up vegetables;  cup of vegetable juice.  Eat 4 to 5 servings of fruit per day. A serving of fruit is 1 medium whole fruit;  cup of dried fruit;  cup of fresh, frozen, or canned fruit;  cup of 100% fruit juice.  Increase your intake of dietary fiber to 20 to 30 grams per day. Insoluble fiber may help lower your risk of heart disease and may help curb your appetite.  Soluble fiber binds cholesterol to be removed from the blood. Foods high in soluble fiber are dried beans, citrus fruits, oats, apples, bananas, broccoli, Brussels sprouts, and eggplant.  Try to include foods fortified with plant sterols or stanols, such as yogurt, breads, juices, or margarines. Choose several fortified foods to achieve a daily intake of 2 to 3 grams of plant sterols or stanols.  Foods with omega-3 fats can help reduce your risk of heart disease. Aim to have a 3.5 oz portion of fatty fish twice per week, such as salmon, mackerel, albacore tuna, sardines, lake trout, or herring. If you wish to take a fish oil supplement, choose one that contains 1 gram of both DHA and EPA.  Limit processed meats to 2 servings (3 oz portion)  weekly.  Limit the sodium in your diet to 1500 milligrams (mg) per day. If you have high blood pressure, talk to a registered dietitian about a DASH (Dietary Approaches to Stop Hypertension) eating plan.  Limit sweets and beverages with added sugar, such as soda, to no more than 5 servings per week. One serving is:   1 tablespoon sugar.  1 tablespoon jelly or jam.   cup sorbet.  1 cup lemonade.   cup regular soda. CHOOSING FOODS Starches  Allowed: Breads: All  kinds (wheat, rye, raisin, white, oatmeal, [place], [place], and English muffin bread). Low-fat rolls: English muffins, frankfurter and hamburger buns, bagels, pita bread, tortillas (not fried). Pancakes, waffles, biscuits, and muffins made with recommended oil.  Avoid: Products made with saturated or trans fats, oils, or whole milk products. Butter rolls, cheese breads, croissants. Commercial doughnuts, muffins, sweet rolls, biscuits, waffles, pancakes, store-bought mixes. Crackers  Allowed: Low-fat crackers and snacks: Animal, graham, rye, saltine (with recommended oil, no lard), oyster, and matzo crackers. Bread sticks, melba toast, rusks, flatbread, pretzels, and light popcorn.  Avoid: High-fat crackers: cheese crackers, butter crackers, and those made with coconut, palm oil, or trans fat (hydrogenated oils). Buttered popcorn. Cereals  Allowed: Hot or cold whole-grain cereals.  Avoid: Cereals containing coconut, hydrogenated vegetable fat, or animal fat. Potatoes / Pasta / Rice  Allowed: All kinds of potatoes, rice, and pasta (such as macaroni, spaghetti, and noodles).  Avoid: Pasta or rice prepared with cream sauce or high-fat cheese. Chow mein noodles, [place] fries. Vegetables  Allowed: All vegetables and vegetable juices.  Avoid: Fried vegetables. Vegetables in cream, butter, or high-fat cheese sauces. Limit coconut. Fruit in cream or custard. Protein  Allowed: Limit your intake of meat, seafood, and poultry to no more than 6 oz (cooked weight) per day. All lean, well-trimmed beef, veal, pork, and lamb. All chicken and [place] without skin. All fish and shellfish. Wild game: wild duck, rabbit, pheasant, and venison. Egg whites or low-cholesterol egg substitutes may be used as desired. Meatless dishes: recipes with dried beans, peas, lentils, and tofu (soybean curd). Seeds and nuts: all seeds and most nuts.  Avoid: Prime grade and other heavily marbled and fatty meats, such as short  ribs, spare ribs, rib eye roast or steak, frankfurters, sausage, bacon, and high-fat luncheon meats, mutton. Caviar. Commercially fried fish. Domestic duck, goose, venison sausage. Organ meats: liver, gizzard, heart, chitterlings, brains, kidney, sweetbreads. Dairy  Allowed: Low-fat cheeses: nonfat or low-fat cottage cheese (1% or 2% fat), cheeses made with part skim milk, such as mozzarella, farmers, string, or ricotta. (Cheeses should be labeled no more than 2 to 6 grams fat per oz.). Skim (or 1%) milk: liquid, powdered, or evaporated. Buttermilk made with low-fat milk. Drinks made with skim or low-fat milk or cocoa. Chocolate milk or cocoa made with skim or low-fat (1%) milk. Nonfat or low-fat yogurt.  Avoid: Whole milk cheeses, including colby, cheddar, [place], Monterey Jack, [place], [place], [place], American, Swiss, and blue. Creamed cottage cheese, cream cheese. Whole milk and whole milk products, including buttermilk or yogurt made from whole milk, drinks made from whole milk. Condensed milk, evaporated whole milk, and 2% milk. Soups and Combination Foods  Allowed: Low-fat low-sodium soups: broth, dehydrated soups, homemade broth, soups with the fat removed, homemade cream soups made with skim or low-fat milk. Low-fat spaghetti, lasagna, chili, and Spanish rice if low-fat ingredients and low-fat cooking techniques are used.  Avoid: Cream soups made with whole milk, cream, or high-fat cheese. All  other soups. Desserts and Sweets  Allowed: Sherbet, fruit ices, gelatins, meringues, and angel food cake. Homemade desserts with recommended fats, oils, and milk products. Jam, jelly, honey, marmalade, sugars, and syrups. Pure sugar candy, such as gum drops, hard candy, jelly beans, marshmallows, mints, and small amounts of dark chocolate.  Avoid: Commercially prepared cakes, pies, cookies, frosting, pudding, or mixes for these products. Desserts containing whole milk products, chocolate, coconut,  lard, palm oil, or palm kernel oil. Ice cream or ice cream drinks. Candy that contains chocolate, coconut, butter, hydrogenated fat, or unknown ingredients. Buttered syrups. Fats and Oils  Allowed: Vegetable oils: safflower, sunflower, corn, soybean, cottonseed, sesame, canola, olive, or peanut. Non-hydrogenated margarines. Salad dressing or mayonnaise: homemade or commercial, made with a recommended oil. Low or nonfat salad dressing or mayonnaise.  Limit added fats and oils to 6 to 8 tsp per day (includes fats used in cooking, baking, salads, and spreads on bread). Remember to count the "hidden fats" in foods.  Avoid: Solid fats and shortenings: butter, lard, salt pork, bacon drippings. Gravy containing meat fat, shortening, or suet. Cocoa butter, coconut. Coconut oil, palm oil, palm kernel oil, or hydrogenated oils: these ingredients are often used in bakery products, nondairy creamers, whipped toppings, candy, and commercially fried foods. Read labels carefully. Salad dressings made of unknown oils, sour cream, or cheese, such as blue cheese and Roquefort. Cream, all kinds: half-and-half, light, heavy, or whipping. Sour cream or cream cheese (even if "light" or low-fat). Nondairy cream substitutes: coffee creamers and sour cream substitutes made with palm, palm kernel, hydrogenated oils, or coconut oil. Beverages  Allowed: Coffee (regular or decaffeinated), tea. Diet carbonated beverages, mineral water. Alcohol: Check with your caregiver. Moderation is recommended.  Avoid: Whole milk, regular sodas, and juice drinks with added sugar. Condiments  Allowed: All seasonings and condiments. Cocoa powder. "Cream" sauces made with recommended ingredients.  Avoid: Carob powder made with hydrogenated fats. SAMPLE MENU Breakfast   cup orange juice   cup oatmeal  1 slice toast  1 tsp margarine  1 cup skim milk Lunch  [place] sandwich with 2 oz [place], 2 slices bread  Lettuce and tomato  slices  Fresh fruit  Carrot sticks  Coffee or tea Snack  Fresh fruit or low-fat crackers Dinner  3 oz lean ground beef  1 baked potato  1 tsp margarine   cup asparagus  Lettuce salad  1 tbs non-creamy dressing   cup peach slices  1 cup skim milk Document Released: 08/01/2008 Document Revised: 04/23/2012 Document Reviewed: 12/23/2013 [employer] Patient Information 2015 [place], [place]. This information is not intended to replace advice given to you by your health care provider. Make sure you discuss any questions you have with your health care provider. Health Maintenance A healthy lifestyle and preventative care can promote health and wellness.  Maintain regular health, dental, and eye exams.  Eat a healthy diet. Foods like vegetables, fruits, whole grains, low-fat dairy products, and lean protein foods contain the nutrients you need and are low in calories. Decrease your intake of foods high in solid fats, added sugars, and salt. Get information about a proper diet from your health care provider, if necessary.  Regular physical exercise is one of the most important things you can do for your health. Most adults should get at least 150 minutes of moderate-intensity exercise (any activity that increases your heart rate and causes you to sweat) each week. In addition, most adults need muscle-strengthening exercises on 2 or more days a  week.   Maintain a healthy weight. The body mass index (BMI) is a screening tool to identify possible weight problems. It provides an estimate of body fat based on height and weight. Your health care provider can find your BMI and can help you achieve or maintain a healthy weight. For males 20 years and older:  A BMI below 18.5 is considered underweight.  A BMI of 18.5 to 24.9 is normal.  A BMI of 25 to 29.9 is considered overweight.  A BMI of 30 and above is considered obese.  Maintain normal blood lipids and cholesterol by exercising and  minimizing your intake of saturated fat. Eat a balanced diet with plenty of fruits and vegetables. Blood tests for lipids and cholesterol should begin at age 50 and be repeated every 5 years. If your lipid or cholesterol levels are high, you are over age 22, or you are at high risk for heart disease, you may need your cholesterol levels checked more frequently.Ongoing high lipid and cholesterol levels should be treated with medicines if diet and exercise are not working.  If you smoke, find out from your health care provider how to quit. If you do not use tobacco, do not start.  Lung cancer screening is recommended for adults aged 22-80 years who are at high risk for developing lung cancer because of a history of smoking. A yearly low-dose CT scan of the lungs is recommended for people who have at least a 30-pack-year history of smoking and are current smokers or have quit within the past 15 years. A pack year of smoking is smoking an average of 1 pack of cigarettes a day for 1 year (for example, a 30-pack-year history of smoking could mean smoking 1 pack a day for 30 years or 2 packs a day for 15 years). Yearly screening should continue until the smoker has stopped smoking for at least 15 years. Yearly screening should be stopped for people who develop a health problem that would prevent them from having lung cancer treatment.  If you choose to drink alcohol, do not have more than 2 drinks per day. One drink is considered to be 12 oz (360 mL) of beer, 5 oz (150 mL) of wine, or 1.5 oz (45 mL) of liquor.  Avoid the use of street drugs. Do not share needles with anyone. Ask for help if you need support or instructions about stopping the use of drugs.  High blood pressure causes heart disease and increases the risk of stroke. Blood pressure should be checked at least every 1-2 years. Ongoing high blood pressure should be treated with medicines if weight loss and exercise are not effective.  If you are  80-45 years old, ask your health care provider if you should take aspirin to prevent heart disease.  Diabetes screening involves taking a blood sample to check your fasting blood sugar level. This should be done once every 3 years after age 59 if you are at a normal weight and without risk factors for diabetes. Testing should be considered at a younger age or be carried out more frequently if you are overweight and have at least 1 risk factor for diabetes.  Colorectal cancer can be detected and often prevented. Most routine colorectal cancer screening begins at the age of 66 and continues through age 25. However, your health care provider may recommend screening at an earlier age if you have risk factors for colon cancer. On a yearly basis, your health care provider may provide  home test kits to check for hidden blood in the stool. A small camera at the end of a tube may be used to directly examine the colon (sigmoidoscopy or colonoscopy) to detect the earliest forms of colorectal cancer. Talk to your health care provider about this at age 44 when routine screening begins. A direct exam of the colon should be repeated every 5-10 years through age 8, unless early forms of precancerous polyps or small growths are found.  People who are at an increased risk for hepatitis B should be screened for this virus. You are considered at high risk for hepatitis B if:  You were born in a country where hepatitis B occurs often. Talk with your health care provider about which countries are considered high risk.  Your parents were born in a high-risk country and you have not received a shot to protect against hepatitis B (hepatitis B vaccine).  You have HIV or AIDS.  You use needles to inject street drugs.  You live with, or have sex with, someone who has hepatitis B.  You are a man who has sex with other men (MSM).  You get hemodialysis treatment.  You take certain medicines for conditions like cancer, organ  transplantation, and autoimmune conditions.  Hepatitis C blood testing is recommended for all people born from [birth year] through 1963 and any individual with known risk factors for hepatitis C.  Healthy men should no longer receive prostate-specific antigen (PSA) blood tests as part of routine cancer screening. Talk to your health care provider about prostate cancer screening.  Testicular cancer screening is not recommended for adolescents or adult males who have no symptoms. Screening includes self-exam, a health care provider exam, and other screening tests. Consult with your health care provider about any symptoms you have or any concerns you have about testicular cancer.  Practice safe sex. Use condoms and avoid high-risk sexual practices to reduce the spread of sexually transmitted infections (STIs).  You should be screened for STIs, including gonorrhea and chlamydia if:  You are sexually active and are younger than 24 years.  You are older than 24 years, and your health care provider tells you that you are at risk for this type of infection.  Your sexual activity has changed since you were last screened, and you are at an increased risk for chlamydia or gonorrhea. Ask your health care provider if you are at risk.  If you are at risk of being infected with HIV, it is recommended that you take a prescription medicine daily to prevent HIV infection. This is called pre-exposure prophylaxis (PrEP). You are considered at risk if:  You are a man who has sex with other men (MSM).  You are a heterosexual man who is sexually active with multiple partners.  You take drugs by injection.  You are sexually active with a partner who has HIV.  Talk with your health care provider about whether you are at high risk of being infected with HIV. If you choose to begin PrEP, you should first be tested for HIV. You should then be tested every 3 months for as long as you are taking PrEP.  Use sunscreen. Apply  sunscreen liberally and repeatedly throughout the day. You should seek shade when your shadow is shorter than you. Protect yourself by wearing long sleeves, pants, a wide-brimmed hat, and sunglasses year round whenever you are outdoors.  Tell your health care provider of new moles or changes in moles, especially if there is a  change in shape or color. Also, tell your health care provider if a mole is larger than the size of a pencil eraser.  A one-time screening for abdominal aortic aneurysm (AAA) and surgical repair of large AAAs by ultrasound is recommended for men aged 59-75 years who are current or former smokers.  Stay current with your vaccines (immunizations). Document Released: 04/20/2008 Document Revised: 10/28/2013 Document Reviewed: 03/20/2011 [facility] Patient Information 2015 [place], [place]. This information is not intended to replace advice given to you by your health care provider. Make sure you discuss any questions you have with your health care provider.

## 2014-06-30 NOTE — Progress Notes (Signed)
Subjective:    Patient ID: [name], male    DOB: Sep 02, 1946, 68 y.o.   MRN: [id number]  HPI  68 year old patient who is seen today for a preventive health examination Medical problems include coronary artery disease, which has been stable.  He has type 2 diabetes, and a history of carotid artery stenosis.  His last colonoscopy was 2003 and did have colonic polyps.    Allergies:  No Known Drug Allergies   Past History:  Past Medical History:   Hyperlipidemia  Hypertension  Diabetes mellitus polyarticular gout  exertional chest pain  Coronary artery disease  Colonic polyps  Past Surgical History:   status post partial gastrectomy for peptic ulcer disease in the 70s  Coronary artery bypass graft April 2009, with LIMA-LAD, SVG-RCA, and sequenced SVG - Ramus and OM1.   Past Medical History  Diagnosis Date  . CAD, ARTERY BYPASS GRAFT 02/10/2008  . CAROTID ARTERY STENOSIS, WITHOUT INFARCTION 09/22/2008  . CORONARY ARTERY DISEASE 04/03/2008  . ERECTILE DYSFUNCTION 01/14/2009  . GOUT 01/16/2008  . HERPES ZOSTER 04/15/2010  . HYPERLIPIDEMIA 01/16/2008  . HYPERTENSION 01/16/2008  . IMPAIRED GLUCOSE TOLERANCE 08/22/2010  . Ulcer 30 yrs ago    stomach  . Shingles 2011    left chest  . Diabetes mellitus     "borderline"    History   Social History  . Marital Status: Married    Spouse Name: N/A    Number of Children: 3  . Years of Education: N/A   Occupational History  .  [employer]   Social History Main Topics  . Smoking status: Former Smoker -- 1.00 packs/day for 10 years    Types: Cigarettes    Quit date: 07/27/1999  . Smokeless tobacco: Never Used  . Alcohol Use: 0.0 oz/week     Comment: 4 beers a day  . Drug Use: No  . Sexual Activity: Not on file   Other Topics Concern  . Not on file   Social History Narrative  . No narrative on file    Past Surgical History  Procedure Laterality Date  . Partial gastrectomy  1974    bleeding ulcers  .  Coronary artery bypass graft  2009    vessels x3  . Laparotomy  08/25/2012    Procedure: EXPLORATORY LAPAROTOMY;  Surgeon: Madilyn Hook, DO;  Location: [facility];  Service: General;  Laterality: N/A;  incision and drainage abdominal wall abcessand intra abdominal wall abcess  . Colonoscopy      3 months ago  . Laparoscopy N/A 07/29/2013    Procedure: LAPAROSCOPY DIAGNOSTIC  laparoscopic excision of urachal cyst ;  Surgeon: Madilyn Hook, DO;  Location: [facility];  Service: General;  Laterality: N/A;  . Ear cyst excision N/A 07/29/2013    Procedure: excision of urachal cyst flexible cystoscopy insertion of foley cath;  Surgeon: Fredricka Bonine, MD;  Location: [facility];  Service: Urology;  Laterality: N/A;  . Urachal  2014    Urachal cyst removal    Family History  Problem Relation Age of Onset  . Heart disease Mother   . Heart disease Sister   . Colon cancer Neg Hx     No Known Allergies  Current Outpatient Prescriptions on File Prior to Visit  Medication Sig Dispense Refill  . allopurinol (ZYLOPRIM) 100 MG tablet TAKE 1 TABLET BY MOUTH EVERY DAY  90 tablet  3  . amLODipine (NORVASC) 10 MG tablet TAKE ONE TABLET BY MOUTH DAILY  90  tablet  1  . aspirin EC 325 MG tablet Take 325 mg by mouth every morning.       Marland Kitchen atorvastatin (LIPITOR) 40 MG tablet Take 1 tablet (40 mg total) by mouth every morning.  90 tablet  6  . Cholecalciferol (VITAMIN D3) 1000 UNITS CAPS Take 1 capsule by mouth daily.      Marland Kitchen glipiZIDE (GLUCOTROL XL) 2.5 MG 24 hr tablet Take 1 tablet (2.5 mg total) by mouth every evening.  30 tablet  5  . glucose blood (ONE TOUCH ULTRA TEST) test strip 1 each by Other route daily. Use as instructed  100 each  12  . ibuprofen (ADVIL,MOTRIN) 200 MG tablet Take 400 mg by mouth every 6 (six) hours as needed. For pain      . Lancets (ONETOUCH ULTRASOFT) lancets 1 each by Other route daily. Use as instructed  100 each  12  . lisinopril (PRINIVIL,ZESTRIL) 10 MG tablet Take 1 tablet (10 mg  total) by mouth daily.  90 tablet  3  . metFORMIN (GLUCOPHAGE) 1000 MG tablet Take 1 tablet (1,000 mg total) by mouth 2 (two) times daily with a meal.  60 tablet  5  . metoprolol (LOPRESSOR) 50 MG tablet TAKE 1 TABLET TWICE A DAY  60 tablet  5  . nitroGLYCERIN (NITROSTAT) 0.4 MG SL tablet Place 1 tablet (0.4 mg total) under the tongue every 5 (five) minutes as needed for chest pain.  30 tablet  12  . Omega 3-6-9 Fatty Acids (OMEGA-3 & OMEGA-6 FISH OIL PO) Take 1,000 mg by mouth daily.        No current facility-administered medications on file prior to visit.    BP 140/70  Pulse 73  Temp(Src) 98.4 F (36.9 C) (Oral)  Resp 20  Ht 5' 9.5" (1.765 m)  Wt 272 lb (123.378 kg)  BMI 39.60 kg/m2  SpO2 98%    Review of Systems  Constitutional: Negative for fever, chills, activity change, appetite change and fatigue.  HENT: Negative for congestion, dental problem, ear pain, hearing loss, mouth sores, rhinorrhea, sinus pressure, sneezing, tinnitus, trouble swallowing and voice change.   Eyes: Negative for photophobia, pain, redness and visual disturbance.  Respiratory: Negative for apnea, cough, choking, chest tightness, shortness of breath and wheezing.   Cardiovascular: Negative for chest pain, palpitations and leg swelling.  Gastrointestinal: Negative for nausea, vomiting, abdominal pain, diarrhea, constipation, blood in stool, abdominal distention, anal bleeding and rectal pain.  Genitourinary: Negative for dysuria, urgency, frequency, hematuria, flank pain, decreased urine volume, discharge, penile swelling, scrotal swelling, difficulty urinating, genital sores and testicular pain.  Musculoskeletal: Negative for arthralgias, back pain, gait problem, joint swelling, myalgias, neck pain and neck stiffness.  Skin: Negative for color change, rash and wound.  Neurological: Negative for dizziness, tremors, seizures, syncope, facial asymmetry, speech difficulty, weakness, light-headedness, numbness  and headaches.  Hematological: Negative for adenopathy. Does not bruise/bleed easily.  Psychiatric/Behavioral: Negative for suicidal ideas, hallucinations, behavioral problems, confusion, sleep disturbance, self-injury, dysphoric mood, decreased concentration and agitation. The patient is not nervous/anxious.        Objective:   Physical Exam  Constitutional: He appears well-developed and well-nourished.  HENT:  Head: Normocephalic and atraumatic.  Right Ear: External ear normal.  Left Ear: External ear normal.  Nose: Nose normal.  Mouth/Throat: Oropharynx is clear and moist.  Eyes: Conjunctivae and EOM are normal. Pupils are equal, round, and reactive to light. No scleral icterus.  Neck: Normal range of motion. Neck supple. No JVD  present. No thyromegaly present.  Cardiovascular: Regular rhythm, normal heart sounds and intact distal pulses.  Exam reveals no gallop and no friction rub.   No murmur heard. Pulmonary/Chest: Effort normal and breath sounds normal. He exhibits no tenderness.  Abdominal: Soft. Bowel sounds are normal. He exhibits no distension and no mass. There is no tenderness.  Genitourinary: Prostate normal and penis normal. Guaiac negative stool.  Musculoskeletal: Normal range of motion. He exhibits no edema and no tenderness.  Lymphadenopathy:    He has no cervical adenopathy.  Neurological: He is alert. He has normal reflexes. No cranial nerve deficit. Coordination normal.  Skin: Skin is warm and dry. No rash noted.  Psychiatric: He has a normal mood and affect. His behavior is normal.          Assessment & Plan:   Preventive health examination Coronary artery disease, stable Type 2 diabetes.  Hemoglobin A1c 7 point 0, exercise, weight loss, heart healthy diet.  All encouraged Dyslipidemia.  Continue atorvastatin Hypertension stable  Recheck 4 months

## 2014-07-10 ENCOUNTER — Other Ambulatory Visit: Payer: Self-pay | Admitting: Internal Medicine

## 2014-07-10 ENCOUNTER — Telehealth: Payer: Self-pay | Admitting: Internal Medicine

## 2014-07-10 NOTE — Telephone Encounter (Signed)
Pt states rx traMADol (ULTRAM) 50 MG tablet Is not at the pharm.  Can you resend? [facility]

## 2014-07-10 NOTE — Telephone Encounter (Signed)
Left message on voicemail to call office on home and mobile numbers.

## 2014-07-14 ENCOUNTER — Other Ambulatory Visit: Payer: Self-pay | Admitting: Internal Medicine

## 2014-07-14 MED ORDER — ATORVASTATIN CALCIUM 40 MG PO TABS: 40.0000 mg | ORAL_TABLET | Freq: Every morning | ORAL | Status: DC

## 2014-07-14 NOTE — Telephone Encounter (Signed)
Spoke to pt's wife Arbie Cookey, told her Rx was called into pharmacy for pt. Arbie Cookey also said pt needs Lipitor sent to [facility] order pharmacy. Told her okay will send Rx. Arbie Cookey verbalized understanding. Rx sent.

## 2014-09-12 ENCOUNTER — Other Ambulatory Visit: Payer: Self-pay | Admitting: Internal Medicine

## 2014-11-03 ENCOUNTER — Telehealth: Payer: Self-pay

## 2014-11-03 NOTE — Telephone Encounter (Signed)
Called and LM on pt VM TCB regarding flu shot

## 2014-11-10 NOTE — Telephone Encounter (Signed)
Pt has been scheduled for flu shot

## 2014-11-12 ENCOUNTER — Ambulatory Visit (INDEPENDENT_AMBULATORY_CARE_PROVIDER_SITE_OTHER): Payer: Medicare Other | Admitting: *Deleted

## 2014-11-12 ENCOUNTER — Ambulatory Visit: Payer: Medicare Other | Admitting: *Deleted

## 2014-11-12 DIAGNOSIS — Z23 Encounter for immunization: Secondary | ICD-10-CM | POA: Diagnosis not present

## 2014-11-20 ENCOUNTER — Other Ambulatory Visit: Payer: Self-pay | Admitting: Internal Medicine

## 2014-11-25 ENCOUNTER — Other Ambulatory Visit: Payer: Self-pay | Admitting: Internal Medicine

## 2014-12-01 ENCOUNTER — Telehealth: Payer: Self-pay | Admitting: Internal Medicine

## 2014-12-01 NOTE — Telephone Encounter (Signed)
PLEASE NOTE: All timestamps contained within this report are represented as [place] Standard Time. CONFIDENTIALTY NOTICE: This fax transmission is intended only for the addressee. It contains information that is legally privileged, confidential or otherwise protected from use or disclosure. If you are not the intended recipient, you are strictly prohibited from reviewing, disclosing, copying using or disseminating any of this information or taking any action in reliance on or regarding this information. If you have received this fax in error, please notify us immediately by telephone so that we can arrange for its return to [place]. Phone: [phone], Toll-Free: [phone], Fax: [phone] Page: 1 of 1 Call Id: [id number] [facility] - Client [place] Patient Name: COLSON BARCO DOB: [birth year] Initial Comment Caller states yesterday husband was having SOB, weakness, still having some weakness today, but he went to work wants to know what she should do. or if he should be seen. Nurse Assessment Nurse: Marcelline Deist, RN, Lynda Date/Time (Eastern Time): 12/01/2014 12:19:49 PM Confirm and document reason for call. If symptomatic, describe symptoms. ---Caller states yesterday husband was having SOB, weakness, still having some weakness today, but he went to work wants to know what she should do. or if he should be seen. States his stomach was not feeling good, vomited once over the wkend. Has the patient traveled out of the country within the last 30 days? ---Not Applicable Does the patient require triage? ---Yes Related visit to physician within the last 2 weeks? ---No Does the PT have any chronic conditions? (i.e. diabetes, asthma, etc.) ---Yes List chronic conditions. ---diabetes, BP rx Guidelines Guideline Title Affirmed Question Affirmed Notes Weakness (Generalized) and Fatigue [1] MODERATE weakness (i.e.,  interferes with work, school, normal activities) AND [2] cause unknown (Exceptions: weakness with acute minor illness, or weakness from poor fluid intake) Final Disposition User See Physician within 4 Hours (or PCP triage) Marcelline Deist, RN, Lynda Comments Caller reports that her husband passed out at work yesterday & they brought him home. He is at work today until around 8 pm, so not able to schedule him an appt. at office, but strongly advised that he should be seen as soon as he can today, not waiting until tomorrow. Caller verbalized understanding.

## 2014-12-01 NOTE — Telephone Encounter (Signed)
FYI

## 2014-12-03 LAB — HM DIABETES EYE EXAM

## 2014-12-10 ENCOUNTER — Encounter: Payer: Self-pay | Admitting: Internal Medicine

## 2015-01-26 ENCOUNTER — Telehealth: Payer: Self-pay | Admitting: Internal Medicine

## 2015-01-26 NOTE — Telephone Encounter (Signed)
[name] used a SDA for Thursday was this ok

## 2015-01-26 NOTE — Telephone Encounter (Signed)
Please see message and advise 

## 2015-01-26 NOTE — Telephone Encounter (Signed)
[name], that is fine. Thanks

## 2015-01-26 NOTE — Telephone Encounter (Signed)
Pt wife called and said pt is having bladder control problems. She said her husband wanted to know if Dr Raliegh Ip can call him in something for the problem   Pharmacy  [place]

## 2015-01-26 NOTE — Telephone Encounter (Signed)
Please call pt and schedule appointment per Dr.K.

## 2015-01-26 NOTE — Telephone Encounter (Signed)
Needs return office visit to assess and follow-up diabetes

## 2015-01-28 ENCOUNTER — Encounter: Payer: Self-pay | Admitting: Internal Medicine

## 2015-01-28 ENCOUNTER — Ambulatory Visit (INDEPENDENT_AMBULATORY_CARE_PROVIDER_SITE_OTHER): Payer: Medicare Other | Admitting: Internal Medicine

## 2015-01-28 VITALS — BP 150/80 | HR 74 | Temp 98.0°F | Resp 20 | Ht 69.5 in | Wt 276.0 lb

## 2015-01-28 DIAGNOSIS — R350 Frequency of micturition: Secondary | ICD-10-CM

## 2015-01-28 DIAGNOSIS — N400 Enlarged prostate without lower urinary tract symptoms: Secondary | ICD-10-CM | POA: Diagnosis not present

## 2015-01-28 DIAGNOSIS — E119 Type 2 diabetes mellitus without complications: Secondary | ICD-10-CM

## 2015-01-28 DIAGNOSIS — N3941 Urge incontinence: Secondary | ICD-10-CM

## 2015-01-28 DIAGNOSIS — R3915 Urgency of urination: Secondary | ICD-10-CM | POA: Diagnosis not present

## 2015-01-28 LAB — POCT URINALYSIS DIPSTICK
BILIRUBIN UA: NEGATIVE
Glucose, UA: NEGATIVE
Ketones, UA: NEGATIVE
LEUKOCYTES UA: NEGATIVE
NITRITE UA: NEGATIVE
RBC UA: NEGATIVE
Spec Grav, UA: 1.015
UROBILINOGEN UA: 0.2
pH, UA: 6

## 2015-01-28 LAB — HEMOGLOBIN A1C: Hgb A1c MFr Bld: 7.1 % — ABNORMAL HIGH (ref 4.6–6.5)

## 2015-01-28 MED ORDER — TAMSULOSIN HCL 0.4 MG PO CAPS: 0.4000 mg | ORAL_CAPSULE | Freq: Every day | ORAL | Status: DC

## 2015-01-28 NOTE — Progress Notes (Signed)
Pre visit review using our clinic review tool, if applicable. No additional management support is needed unless otherwise documented below in the visit note. 

## 2015-01-28 NOTE — Patient Instructions (Signed)
Benign Prostatic Hyperplasia An enlarged prostate (benign prostatic hyperplasia) is common in older men. You may experience the following:  Weak urine stream.  Dribbling.  Feeling like the bladder has not emptied completely.  Difficulty starting urination.  Getting up frequently at night to urinate.  Urinating more frequently during the day. HOME CARE INSTRUCTIONS  Monitor your prostatic hyperplasia for any changes. The following actions may help to alleviate any discomfort you are experiencing:  Give yourself time when you urinate.  Stay away from alcohol.  Avoid beverages containing caffeine, such as coffee, tea, and colas, because they can make the problem worse.  Avoid decongestants, antihistamines, and some prescription medicines that can make the problem worse.  Follow up with your health care provider for further treatment as recommended. SEEK MEDICAL CARE IF:  You are experiencing progressive difficulty voiding.  Your urine stream is progressively getting narrower.  You are awaking from sleep with the urge to void more frequently.  You are constantly feeling the need to void.  You experience loss of urine, especially in small amounts. SEEK IMMEDIATE MEDICAL CARE IF:   You develop increased pain with urination or are unable to urinate.  You develop severe abdominal pain, vomiting, a high fever, or fainting.  You develop back pain or blood in your urine. MAKE SURE YOU:   Understand these instructions.  Will watch your condition.  Will get help right away if you are not doing well or get worse. Document Released: 10/23/2005 Document Revised: 06/25/2013 Document Reviewed: 03/25/2013 ExitCare Patient Information 2015 [employer]. This information is not intended to replace advice given to you by your health care provider. Make sure you discuss any questions you have with your health care provider.  

## 2015-01-28 NOTE — Progress Notes (Signed)
Subjective:    Patient ID: [name], male    DOB: 05-16-1946, 69 y.o.   MRN: [id number]  HPI  69 year old patient who has type 2 diabetes.  This has been controlled on oral medications.  Hemoglobin A1c's have been under good control.  Complaints today include urinary frequency, urgency and and nocturia.  Symptoms have worsened to the point of near urge incontinence.  He has nocturia times 4  Past Medical History  Diagnosis Date  . CAD, ARTERY BYPASS GRAFT 02/10/2008  . CAROTID ARTERY STENOSIS, WITHOUT INFARCTION 09/22/2008  . CORONARY ARTERY DISEASE 04/03/2008  . ERECTILE DYSFUNCTION 01/14/2009  . GOUT 01/16/2008  . HERPES ZOSTER 04/15/2010  . HYPERLIPIDEMIA 01/16/2008  . HYPERTENSION 01/16/2008  . IMPAIRED GLUCOSE TOLERANCE 08/22/2010  . Ulcer 30 yrs ago    stomach  . Shingles 2011    left chest  . Diabetes mellitus     "borderline"    History   Social History  . Marital Status: Married    Spouse Name: N/A  . Number of Children: 3  . Years of Education: N/A   Occupational History  .  [employer]   Social History Main Topics  . Smoking status: Former Smoker -- 1.00 packs/day for 10 years    Types: Cigarettes    Quit date: 07/27/1999  . Smokeless tobacco: Never Used  . Alcohol Use: 0.0 oz/week     Comment: 4 beers a day  . Drug Use: No  . Sexual Activity: Not on file   Other Topics Concern  . Not on file   Social History Narrative    Past Surgical History  Procedure Laterality Date  . Partial gastrectomy  1974    bleeding ulcers  . Coronary artery bypass graft  2009    vessels x3  . Laparotomy  08/25/2012    Procedure: EXPLORATORY LAPAROTOMY;  Surgeon: Madilyn Hook, DO;  Location: [facility];  Service: General;  Laterality: N/A;  incision and drainage abdominal wall abcessand intra abdominal wall abcess  . Colonoscopy      3 months ago  . Laparoscopy N/A 07/29/2013    Procedure: LAPAROSCOPY DIAGNOSTIC  laparoscopic excision of urachal cyst ;  Surgeon:  Madilyn Hook, DO;  Location: [facility];  Service: General;  Laterality: N/A;  . Ear cyst excision N/A 07/29/2013    Procedure: excision of urachal cyst flexible cystoscopy insertion of foley cath;  Surgeon: Fredricka Bonine, MD;  Location: [facility];  Service: Urology;  Laterality: N/A;  . Urachal  2014    Urachal cyst removal    Family History  Problem Relation Age of Onset  . Heart disease Mother   . Heart disease Sister   . Colon cancer Neg Hx     No Known Allergies  Current Outpatient Prescriptions on File Prior to Visit  Medication Sig Dispense Refill  . allopurinol (ZYLOPRIM) 100 MG tablet TAKE 1 TABLET BY MOUTH EVERY DAY 90 tablet 3  . amLODipine (NORVASC) 10 MG tablet TAKE ONE TABLET BY MOUTH DAILY 90 tablet 1  . aspirin EC 325 MG tablet Take 325 mg by mouth every morning.     Marland Kitchen atorvastatin (LIPITOR) 40 MG tablet Take 1 tablet (40 mg total) by mouth every morning. 90 tablet 3  . Cholecalciferol (VITAMIN D3) 1000 UNITS CAPS Take 1 capsule by mouth daily.    Marland Kitchen glipiZIDE (GLUCOTROL XL) 2.5 MG 24 hr tablet Take 1 tablet (2.5 mg total) by mouth every evening. 30 tablet 5  .  glucose blood (ONE TOUCH ULTRA TEST) test strip 1 each by Other route daily. Use as instructed 100 each 12  . ibuprofen (ADVIL,MOTRIN) 200 MG tablet Take 400 mg by mouth every 6 (six) hours as needed. For pain    . Lancets (ONETOUCH ULTRASOFT) lancets 1 each by Other route daily. Use as instructed 100 each 12  . lisinopril (PRINIVIL,ZESTRIL) 10 MG tablet Take 1 tablet (10 mg total) by mouth daily. 90 tablet 3  . metFORMIN (GLUCOPHAGE) 1000 MG tablet Take 1 tablet (1,000 mg total) by mouth 2 (two) times daily with a meal. 60 tablet 5  . metoprolol (LOPRESSOR) 50 MG tablet TAKE 1 TABLET TWICE A DAY 60 tablet 5  . nitroGLYCERIN (NITROSTAT) 0.4 MG SL tablet Place 1 tablet (0.4 mg total) under the tongue every 5 (five) minutes as needed for chest pain. 30 tablet 12  . Omega 3-6-9 Fatty Acids (OMEGA-3 & OMEGA-6 FISH  OIL PO) Take 1,000 mg by mouth daily.     . traMADol (ULTRAM) 50 MG tablet TAKE 1 TABLET BY MOUTH EVERY 6 HOURS AS NEEDED FOR PAIN 60 tablet 2   No current facility-administered medications on file prior to visit.    BP 150/80 mmHg  Pulse 74  Temp(Src) 98 F (36.7 C) (Oral)  Resp 20  Ht 5' 9.5" (1.765 m)  Wt 276 lb (125.193 kg)  BMI 40.19 kg/m2  SpO2 96%      Review of Systems  Constitutional: Negative for fever, chills, appetite change and fatigue.  HENT: Negative for congestion, dental problem, ear pain, hearing loss, sore throat, tinnitus, trouble swallowing and voice change.   Eyes: Negative for pain, discharge and visual disturbance.  Respiratory: Negative for cough, chest tightness, wheezing and stridor.   Cardiovascular: Negative for chest pain, palpitations and leg swelling.  Gastrointestinal: Negative for nausea, vomiting, abdominal pain, diarrhea, constipation, blood in stool and abdominal distention.  Genitourinary: Positive for urgency and frequency. Negative for dysuria, hematuria, flank pain, discharge, difficulty urinating and genital sores.  Musculoskeletal: Negative for myalgias, back pain, joint swelling, arthralgias, gait problem and neck stiffness.  Skin: Negative for rash.  Neurological: Negative for dizziness, syncope, speech difficulty, weakness, numbness and headaches.  Hematological: Negative for adenopathy. Does not bruise/bleed easily.  Psychiatric/Behavioral: Negative for behavioral problems and dysphoric mood. The patient is not nervous/anxious.        Objective:   Physical Exam  Constitutional: He is oriented to person, place, and time. He appears well-developed. No distress.  HENT:  Head: Normocephalic.  Right Ear: External ear normal.  Left Ear: External ear normal.  Eyes: Conjunctivae and EOM are normal.  Neck: Normal range of motion.  Cardiovascular: Normal rate and normal heart sounds.   Pulmonary/Chest: Breath sounds normal.    Abdominal: Bowel sounds are normal.  Musculoskeletal: Normal range of motion. He exhibits no edema or tenderness.  Neurological: He is alert and oriented to person, place, and time.  Psychiatric: He has a normal mood and affect. His behavior is normal.          Assessment & Plan:   Symptomatic BPH.  Will place on Flomax.  Patient instructions discussed and dispensed Diabetes mellitus.  Will check a hemoglobin A1c Recheck 3 months

## 2015-02-24 ENCOUNTER — Other Ambulatory Visit: Payer: Self-pay | Admitting: Internal Medicine

## 2015-05-26 ENCOUNTER — Other Ambulatory Visit: Payer: Self-pay | Admitting: Internal Medicine

## 2015-05-28 ENCOUNTER — Other Ambulatory Visit: Payer: Self-pay | Admitting: Internal Medicine

## 2015-05-31 ENCOUNTER — Other Ambulatory Visit: Payer: Self-pay | Admitting: Internal Medicine

## 2015-06-01 ENCOUNTER — Telehealth: Payer: Self-pay | Admitting: Internal Medicine

## 2015-06-01 MED ORDER — ALLOPURINOL 100 MG PO TABS: 100.0000 mg | ORAL_TABLET | Freq: Every day | ORAL | Status: DC

## 2015-06-01 MED ORDER — TAMSULOSIN HCL 0.4 MG PO CAPS: 0.4000 mg | ORAL_CAPSULE | Freq: Every day | ORAL | Status: DC

## 2015-06-01 MED ORDER — AMLODIPINE BESYLATE 10 MG PO TABS: 10.0000 mg | ORAL_TABLET | Freq: Every day | ORAL | Status: DC

## 2015-06-01 NOTE — Telephone Encounter (Signed)
Left message with pt's wife and on cell phone need to call office.

## 2015-06-01 NOTE — Telephone Encounter (Signed)
Pt has new pharm [facility] needs  tamsulosin 0.4 mg #90,amlodipine 10 mg #90, allopurinol 100 mg #90, tramadol 50  mg #60 each w/refills

## 2015-06-02 NOTE — Telephone Encounter (Signed)
Left detailed message on personal voicemail Rx's sent to [facility] except Tramadol. Due for Physical in August, need to schedule. I can send Rx for Tramadol to local pharmacy till you have your physical. Please call and let me know.

## 2015-06-23 ENCOUNTER — Telehealth: Payer: Self-pay | Admitting: Internal Medicine

## 2015-06-23 NOTE — Telephone Encounter (Signed)
Pt needs new rx metoprolol 50 mg #90 w/refills send to [facility]

## 2015-06-24 MED ORDER — METOPROLOL TARTRATE 50 MG PO TABS: 50.0000 mg | ORAL_TABLET | Freq: Two times a day (BID) | ORAL | Status: DC

## 2015-06-24 NOTE — Telephone Encounter (Signed)
Pt's wife notified Rx sent to [facility].

## 2015-07-01 ENCOUNTER — Ambulatory Visit (INDEPENDENT_AMBULATORY_CARE_PROVIDER_SITE_OTHER): Payer: BLUE CROSS/BLUE SHIELD | Admitting: Internal Medicine

## 2015-07-01 ENCOUNTER — Encounter: Payer: Self-pay | Admitting: *Deleted

## 2015-07-01 ENCOUNTER — Encounter: Payer: Self-pay | Admitting: Internal Medicine

## 2015-07-01 VITALS — BP 118/66 | HR 69 | Temp 98.5°F | Resp 20 | Ht 69.5 in | Wt 281.0 lb

## 2015-07-01 DIAGNOSIS — I10 Essential (primary) hypertension: Secondary | ICD-10-CM

## 2015-07-01 DIAGNOSIS — I6523 Occlusion and stenosis of bilateral carotid arteries: Secondary | ICD-10-CM | POA: Diagnosis not present

## 2015-07-01 DIAGNOSIS — E785 Hyperlipidemia, unspecified: Secondary | ICD-10-CM | POA: Diagnosis not present

## 2015-07-01 DIAGNOSIS — I2510 Atherosclerotic heart disease of native coronary artery without angina pectoris: Secondary | ICD-10-CM | POA: Diagnosis not present

## 2015-07-01 DIAGNOSIS — E0859 Diabetes mellitus due to underlying condition with other circulatory complications: Secondary | ICD-10-CM

## 2015-07-01 DIAGNOSIS — Z0000 Encounter for general adult medical examination without abnormal findings: Secondary | ICD-10-CM | POA: Diagnosis not present

## 2015-07-01 DIAGNOSIS — Z23 Encounter for immunization: Secondary | ICD-10-CM

## 2015-07-01 LAB — COMPREHENSIVE METABOLIC PANEL
ALT: 19 U/L (ref 0–53)
AST: 18 U/L (ref 0–37)
Albumin: 3.5 g/dL (ref 3.5–5.2)
Alkaline Phosphatase: 54 U/L (ref 39–117)
BILIRUBIN TOTAL: 0.6 mg/dL (ref 0.2–1.2)
BUN: 25 mg/dL — AB (ref 6–23)
CALCIUM: 9.7 mg/dL (ref 8.4–10.5)
CO2: 28 meq/L (ref 19–32)
CREATININE: 1.19 mg/dL (ref 0.40–1.50)
Chloride: 102 mEq/L (ref 96–112)
GFR: 78.44 mL/min (ref >60.00)
Glucose, Bld: 138 mg/dL — ABNORMAL HIGH (ref 70–99)
Potassium: 4.8 mEq/L (ref 3.5–5.1)
SODIUM: 136 meq/L (ref 135–145)
Total Protein: 7.7 g/dL (ref 6.0–8.3)

## 2015-07-01 LAB — CBC WITH DIFFERENTIAL/PLATELET
BASOS PCT: 0.4 % (ref 0.0–3.0)
Basophils Absolute: 0 10*3/uL (ref 0.0–0.1)
EOS ABS: 0.2 10*3/uL (ref 0.0–0.7)
Eosinophils Relative: 2.1 % (ref 0.0–5.0)
HCT: 37.2 % — ABNORMAL LOW (ref 39.0–52.0)
HEMOGLOBIN: 12.8 g/dL — AB (ref 13.0–17.0)
LYMPHS ABS: 1.3 10*3/uL (ref 0.7–4.0)
Lymphocytes Relative: 17.5 % (ref 12.0–46.0)
MCHC: 34.3 g/dL (ref 30.0–36.0)
MCV: 92.7 fl (ref 78.0–100.0)
MONO ABS: 0.8 10*3/uL (ref 0.1–1.0)
Monocytes Relative: 11.2 % (ref 3.0–12.0)
NEUTROS ABS: 5.2 10*3/uL (ref 1.4–7.7)
NEUTROS PCT: 68.8 % (ref 43.0–77.0)
PLATELETS: 221 10*3/uL (ref 150.0–400.0)
RBC: 4.02 Mil/uL — ABNORMAL LOW (ref 4.22–5.81)
RDW: 14.5 % (ref 11.5–15.5)
WBC: 7.6 10*3/uL (ref 4.0–10.5)

## 2015-07-01 LAB — LIPID PANEL
CHOL/HDL RATIO: 3
Cholesterol: 171 mg/dL (ref 0–200)
HDL: 62.2 mg/dL (ref >39.00)
LDL Cholesterol: 94 mg/dL (ref 0–99)
NonHDL: 108.59
TRIGLYCERIDES: 74 mg/dL (ref 0.0–149.0)
VLDL: 14.8 mg/dL (ref 0.0–40.0)

## 2015-07-01 LAB — MICROALBUMIN / CREATININE URINE RATIO
Creatinine,U: 89.5 mg/dL
MICROALB/CREAT RATIO: 10.7 mg/g (ref 0.0–30.0)
Microalb, Ur: 9.6 mg/dL — ABNORMAL HIGH (ref 0.0–1.9)

## 2015-07-01 LAB — TSH: TSH: 1.31 u[IU]/mL (ref 0.35–4.50)

## 2015-07-01 LAB — HEMOGLOBIN A1C: Hgb A1c MFr Bld: 6.8 % — ABNORMAL HIGH (ref 4.6–6.5)

## 2015-07-01 MED ORDER — TRAMADOL HCL 50 MG PO TABS: 50.0000 mg | ORAL_TABLET | Freq: Four times a day (QID) | ORAL | Status: DC | PRN

## 2015-07-01 NOTE — Patient Instructions (Addendum)
Limit your sodium (Salt) intake   Please check your hemoglobin A1c every 3 months  You need to lose weight.  Consider a lower calorie diet and regular exercise.  Return in 4 months for follow-up Fat and Cholesterol Control Diet Fat and cholesterol levels in your blood and organs are influenced by your diet. High levels of fat and cholesterol may lead to diseases of the heart, small and large blood vessels, gallbladder, liver, and pancreas. CONTROLLING FAT AND CHOLESTEROL WITH DIET Although exercise and lifestyle factors are important, your diet is key. That is because certain foods are known to raise cholesterol and others to lower it. The goal is to balance foods for their effect on cholesterol and more importantly, to replace saturated and trans fat with other types of fat, such as monounsaturated fat, polyunsaturated fat, and omega-3 fatty acids. On average, a person should consume no more than 15 to 17 g of saturated fat daily. Saturated and trans fats are considered "bad" fats, and they will raise LDL cholesterol. Saturated fats are primarily found in animal products such as meats, butter, and cream. However, that does not mean you need to give up all your favorite foods. Today, there are good tasting, low-fat, low-cholesterol substitutes for most of the things you like to eat. Choose low-fat or nonfat alternatives. Choose round or loin cuts of red meat. These types of cuts are lowest in fat and cholesterol. Chicken (without the skin), fish, veal, and ground [place] breast are great choices. Eliminate fatty meats, such as hot dogs and salami. Even shellfish have little or no saturated fat. Have a 3 oz (85 g) portion when you eat lean meat, poultry, or fish. Trans fats are also called "partially hydrogenated oils." They are oils that have been scientifically manipulated so that they are solid at room temperature resulting in a longer shelf life and improved taste and texture of foods in which they are  added. Trans fats are found in stick margarine, some tub margarines, cookies, crackers, and baked goods.  When baking and cooking, oils are a great substitute for butter. The monounsaturated oils are especially beneficial since it is believed they lower LDL and raise HDL. The oils you should avoid entirely are saturated tropical oils, such as coconut and palm.  Remember to eat a lot from food groups that are naturally free of saturated and trans fat, including fish, fruit, vegetables, beans, grains (barley, rice, couscous, bulgur wheat), and pasta (without cream sauces).  IDENTIFYING FOODS THAT LOWER FAT AND CHOLESTEROL  Soluble fiber may lower your cholesterol. This type of fiber is found in fruits such as apples, vegetables such as broccoli, potatoes, and carrots, legumes such as beans, peas, and lentils, and grains such as barley. Foods fortified with plant sterols (phytosterol) may also lower cholesterol. You should eat at least 2 g per day of these foods for a cholesterol lowering effect.  Read package labels to identify low-saturated fats, trans fat free, and low-fat foods at the supermarket. Select cheeses that have only 2 to 3 g saturated fat per ounce. Use a heart-healthy tub margarine that is free of trans fats or partially hydrogenated oil. When buying baked goods (cookies, crackers), avoid partially hydrogenated oils. Breads and muffins should be made from whole grains (whole-wheat or whole oat flour, instead of "flour" or "enriched flour"). Buy non-creamy canned soups with reduced salt and no added fats.  FOOD PREPARATION TECHNIQUES  Never deep-fry. If you must fry, either stir-fry, which uses very little fat,  or use non-stick cooking sprays. When possible, broil, bake, or roast meats, and steam vegetables. Instead of putting butter or margarine on vegetables, use lemon and herbs, applesauce, and cinnamon (for squash and sweet potatoes). Use nonfat yogurt, salsa, and low-fat dressings for salads.   LOW-SATURATED FAT / LOW-FAT FOOD SUBSTITUTES Meats / Saturated Fat (g)  Avoid: Steak, marbled (3 oz/85 g) / 11 g  Choose: Steak, lean (3 oz/85 g) / 4 g  Avoid: Hamburger (3 oz/85 g) / 7 g  Choose: Hamburger, lean (3 oz/85 g) / 5 g  Avoid: Ham (3 oz/85 g) / 6 g  Choose: Ham, lean cut (3 oz/85 g) / 2.4 g  Avoid: Chicken, with skin, dark meat (3 oz/85 g) / 4 g  Choose: Chicken, skin removed, dark meat (3 oz/85 g) / 2 g  Avoid: Chicken, with skin, light meat (3 oz/85 g) / 2.5 g  Choose: Chicken, skin removed, light meat (3 oz/85 g) / 1 g Dairy / Saturated Fat (g)  Avoid: Whole milk (1 cup) / 5 g  Choose: Low-fat milk, 2% (1 cup) / 3 g  Choose: Low-fat milk, 1% (1 cup) / 1.5 g  Choose: Skim milk (1 cup) / 0.3 g  Avoid: Hard cheese (1 oz/28 g) / 6 g  Choose: Skim milk cheese (1 oz/28 g) / 2 to 3 g  Avoid: Cottage cheese, 4% fat (1 cup) / 6.5 g  Choose: Low-fat cottage cheese, 1% fat (1 cup) / 1.5 g  Avoid: Ice cream (1 cup) / 9 g  Choose: Sherbet (1 cup) / 2.5 g  Choose: Nonfat frozen yogurt (1 cup) / 0.3 g  Choose: Frozen fruit bar / trace  Avoid: Whipped cream (1 tbs) / 3.5 g  Choose: Nondairy whipped topping (1 tbs) / 1 g Condiments / Saturated Fat (g)  Avoid: Mayonnaise (1 tbs) / 2 g  Choose: Low-fat mayonnaise (1 tbs) / 1 g  Avoid: Butter (1 tbs) / 7 g  Choose: Extra light margarine (1 tbs) / 1 g  Avoid: Coconut oil (1 tbs) / 11.8 g  Choose: Olive oil (1 tbs) / 1.8 g  Choose: Corn oil (1 tbs) / 1.7 g  Choose: Safflower oil (1 tbs) / 1.2 g  Choose: Sunflower oil (1 tbs) / 1.4 g  Choose: Soybean oil (1 tbs) / 2.4 g  Choose: Canola oil (1 tbs) / 1 g Document Released: 10/23/2005 Document Revised: 02/17/2013 Document Reviewed: 01/21/2014 Exit[employer] Patient Information 2015 [place], [place]. This information is not intended to replace advice given to you by your health care provider. Make sure you discuss any questions you have with your health care  provider.

## 2015-07-01 NOTE — Progress Notes (Signed)
Pre visit review using our clinic review tool, if applicable. No additional management support is needed unless otherwise documented below in the visit note. 
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
## 2015-07-15 ENCOUNTER — Ambulatory Visit (HOSPITAL_COMMUNITY)
Admission: RE | Admit: 2015-07-15 | Discharge: 2015-07-15 | Disposition: A | Discharge status: Home / Self Care | Payer: BLUE CROSS/BLUE SHIELD | Source: Ambulatory Visit | Attending: Internal Medicine | Admitting: Internal Medicine

## 2015-07-15 ENCOUNTER — Inpatient Hospital Stay (HOSPITAL_COMMUNITY): Admission: RE | Admit: 2015-07-15 | Payer: Medicare Other | Source: Ambulatory Visit

## 2015-07-15 DIAGNOSIS — I6523 Occlusion and stenosis of bilateral carotid arteries: Secondary | ICD-10-CM | POA: Diagnosis not present

## 2015-07-15 DIAGNOSIS — I10 Essential (primary) hypertension: Secondary | ICD-10-CM | POA: Diagnosis not present

## 2015-07-15 DIAGNOSIS — E785 Hyperlipidemia, unspecified: Secondary | ICD-10-CM | POA: Insufficient documentation

## 2015-07-15 DIAGNOSIS — I6522 Occlusion and stenosis of left carotid artery: Secondary | ICD-10-CM | POA: Diagnosis not present

## 2015-07-21 ENCOUNTER — Telehealth: Payer: Self-pay | Admitting: Internal Medicine

## 2015-07-21 NOTE — Telephone Encounter (Signed)
Pt has switched to mailorder and would like you to send his metFORMIN (GLUCOPHAGE) 1000 MG tablet [facility]

## 2015-07-22 MED ORDER — METFORMIN HCL 1000 MG PO TABS: 1000.0000 mg | ORAL_TABLET | Freq: Two times a day (BID) | ORAL | Status: DC

## 2015-07-22 NOTE — Telephone Encounter (Signed)
Left detailed message on voicemail Rx sent to [facility] as requested. Any questions please call office.

## 2015-07-25 ENCOUNTER — Other Ambulatory Visit: Payer: Self-pay | Admitting: Internal Medicine

## 2015-08-16 ENCOUNTER — Other Ambulatory Visit: Payer: Self-pay | Admitting: *Deleted

## 2015-08-16 MED ORDER — ATORVASTATIN CALCIUM 40 MG PO TABS: 40.0000 mg | ORAL_TABLET | Freq: Every morning | ORAL | Status: DC

## 2016-04-06 ENCOUNTER — Telehealth: Payer: Self-pay | Admitting: Family Medicine

## 2016-04-06 ENCOUNTER — Telehealth: Payer: Self-pay | Admitting: Internal Medicine

## 2016-04-06 NOTE — Telephone Encounter (Signed)
Pt request refill of the following:   traMADol (ULTRAM) 50 MG tablet   Phamacy:   [facility]

## 2016-04-06 NOTE — Telephone Encounter (Signed)
Refill request for Tramadol and send to [facility].

## 2016-04-07 MED ORDER — TRAMADOL HCL 50 MG PO TABS: 50.0000 mg | ORAL_TABLET | Freq: Four times a day (QID) | ORAL | Status: DC | PRN

## 2016-04-07 NOTE — Telephone Encounter (Signed)
Rx faxed to [facility].

## 2016-04-07 NOTE — Telephone Encounter (Signed)
Rx sent 

## 2016-05-06 ENCOUNTER — Other Ambulatory Visit: Payer: Self-pay | Admitting: Internal Medicine

## 2016-07-14 ENCOUNTER — Other Ambulatory Visit: Payer: Self-pay | Admitting: Internal Medicine

## 2016-07-16 DIAGNOSIS — R110 Nausea: Secondary | ICD-10-CM | POA: Diagnosis not present

## 2016-07-16 DIAGNOSIS — R0789 Other chest pain: Secondary | ICD-10-CM | POA: Diagnosis not present

## 2016-07-21 ENCOUNTER — Ambulatory Visit (INDEPENDENT_AMBULATORY_CARE_PROVIDER_SITE_OTHER): Payer: Medicare Other | Admitting: Internal Medicine

## 2016-07-21 ENCOUNTER — Encounter: Payer: Self-pay | Admitting: Internal Medicine

## 2016-07-21 VITALS — BP 110/64 | HR 72 | Temp 98.5°F | Resp 20 | Ht 69.5 in | Wt 279.2 lb

## 2016-07-21 DIAGNOSIS — Z23 Encounter for immunization: Secondary | ICD-10-CM

## 2016-07-21 DIAGNOSIS — I2510 Atherosclerotic heart disease of native coronary artery without angina pectoris: Secondary | ICD-10-CM | POA: Diagnosis not present

## 2016-07-21 DIAGNOSIS — R0789 Other chest pain: Secondary | ICD-10-CM

## 2016-07-21 DIAGNOSIS — I10 Essential (primary) hypertension: Secondary | ICD-10-CM | POA: Diagnosis not present

## 2016-07-21 DIAGNOSIS — E785 Hyperlipidemia, unspecified: Secondary | ICD-10-CM

## 2016-07-21 DIAGNOSIS — E0859 Diabetes mellitus due to underlying condition with other circulatory complications: Secondary | ICD-10-CM | POA: Diagnosis not present

## 2016-07-21 LAB — CBC WITH DIFFERENTIAL/PLATELET
BASOS ABS: 0 {cells}/uL (ref 0–200)
Basophils Relative: 0 %
EOS ABS: 146 {cells}/uL (ref 15–500)
Eosinophils Relative: 2 %
HEMATOCRIT: 38.9 % (ref 38.5–50.0)
Hemoglobin: 12.7 g/dL — ABNORMAL LOW (ref 13.2–17.1)
LYMPHS PCT: 21 %
Lymphs Abs: 1533 cells/uL (ref 850–3900)
MCH: 31.3 pg (ref 27.0–33.0)
MCHC: 32.6 g/dL (ref 32.0–36.0)
MCV: 95.8 fL (ref 80.0–100.0)
MONO ABS: 803 {cells}/uL (ref 200–950)
MPV: 10 fL (ref 7.5–12.5)
Monocytes Relative: 11 %
NEUTROS PCT: 66 %
Neutro Abs: 4818 cells/uL (ref 1500–7800)
Platelets: 230 10*3/uL (ref 140–400)
RBC: 4.06 MIL/uL — AB (ref 4.20–5.80)
RDW: 14 % (ref 11.0–15.0)
WBC: 7.3 10*3/uL (ref 3.8–10.8)

## 2016-07-21 MED ORDER — NITROGLYCERIN 0.4 MG SL SUBL: 0.4000 mg | SUBLINGUAL_TABLET | SUBLINGUAL | 5 refills | Status: DC | PRN

## 2016-07-21 NOTE — Progress Notes (Signed)
Subjective:    Patient ID: [name], male    DOB: 12/24/1945, 70 y.o.   MRN: [id number]  HPI 70 year old patient who is seen today in follow-up.  7 days ago he was evaluated by EMS following an episode of chest pain.  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  This was associated with lightheadedness and lasted about 5 minutes.  EKG was normal.  He is status post CABG in 2009 prior to surgery.  He had typical significant exertional chest pain.  No history of recent exertional chest pain. He has a history of diabetes but has not been seen in over one year.  He states he has been compliant with his medications. No recent nitroglycerin use No recent stress tests   Past Medical History:  Diagnosis Date  . CAD, ARTERY BYPASS GRAFT 02/10/2008  . CAROTID ARTERY STENOSIS, WITHOUT INFARCTION 09/22/2008  . CORONARY ARTERY DISEASE 04/03/2008  . Diabetes mellitus    "borderline"  . ERECTILE DYSFUNCTION 01/14/2009  . GOUT 01/16/2008  . HERPES ZOSTER 04/15/2010  . HYPERLIPIDEMIA 01/16/2008  . HYPERTENSION 01/16/2008  . IMPAIRED GLUCOSE TOLERANCE 08/22/2010  . Shingles 2011   left chest  . Ulcer 30 yrs ago   stomach     Social History   Social History  . Marital status: Married    Spouse name: N/A  . Number of children: 3  . Years of education: N/A   Occupational History  .  [employer]   Social History Main Topics  . Smoking status: Former Smoker    Packs/day: 1.00    Years: 10.00    Types: Cigarettes    Quit date: 07/27/1999  . Smokeless tobacco: Never Used  . Alcohol use 0.0 oz/week     Comment: 4 beers a day  . Drug use: No  . Sexual activity: Not on file   Other Topics Concern  . Not on file   Social History Narrative  . No narrative on file    Past Surgical History:  Procedure Laterality Date  . COLONOSCOPY     3 months ago  . CORONARY ARTERY BYPASS GRAFT  2009   vessels x3  . EAR CYST  EXCISION N/A 07/29/2013   Procedure: excision of urachal cyst flexible cystoscopy insertion of foley cath;  Surgeon: Fredricka Bonine, MD;  Location: [facility];  Service: Urology;  Laterality: N/A;  . LAPAROSCOPY N/A 07/29/2013   Procedure: LAPAROSCOPY DIAGNOSTIC  laparoscopic excision of urachal cyst ;  Surgeon: Madilyn Hook, DO;  Location: [facility];  Service: General;  Laterality: N/A;  . LAPAROTOMY  08/25/2012   Procedure: EXPLORATORY LAPAROTOMY;  Surgeon: Madilyn Hook, DO;  Location: [facility];  Service: General;  Laterality: N/A;  incision and drainage abdominal wall abcessand intra abdominal wall abcess  . PARTIAL GASTRECTOMY  1974   bleeding ulcers  . urachal  2014   Urachal cyst removal    Family History  Problem Relation Age of Onset  . Heart disease Mother   . Heart disease Sister   . Colon cancer Neg Hx     No Known Allergies  Current Outpatient Prescriptions on File Prior to Visit  Medication Sig Dispense Refill  . allopurinol (ZYLOPRIM) 100 MG tablet Take 1 tablet by mouth  daily 90 tablet 3  . amLODipine (NORVASC) 10 MG tablet Take 1 tablet by mouth  daily 90 tablet 0  . aspirin  EC 325 MG tablet Take 325 mg by mouth every morning.     Marland Kitchen atorvastatin (LIPITOR) 40 MG tablet Take 1 tablet (40 mg total) by mouth every morning. 90 tablet 3  . Cholecalciferol (VITAMIN D3) 1000 UNITS CAPS Take 1 capsule by mouth daily.    Marland Kitchen glipiZIDE (GLUCOTROL XL) 2.5 MG 24 hr tablet Take 1 tablet (2.5 mg total) by mouth every evening. 30 tablet 5  . glucose blood (ONE TOUCH ULTRA TEST) test strip 1 each by Other route daily. Use as instructed 100 each 12  . ibuprofen (ADVIL,MOTRIN) 200 MG tablet Take 400 mg by mouth every 6 (six) hours as needed. For pain    . Lancets (ONETOUCH ULTRASOFT) lancets 1 each by Other route daily. Use as instructed 100 each 12  . lisinopril (PRINIVIL,ZESTRIL) 10 MG tablet Take 1 tablet (10 mg total) by mouth daily. 90 tablet 3  . metFORMIN (GLUCOPHAGE) 1000 MG tablet  Take 1 tablet (1,000 mg total) by mouth 2 (two) times daily with a meal. 180 tablet 1  . metoprolol (LOPRESSOR) 50 MG tablet Take 1 tablet by mouth two  times daily 180 tablet 0  . Omega 3-6-9 Fatty Acids (OMEGA-3 & OMEGA-6 FISH OIL PO) Take 1,000 mg by mouth daily.     . tamsulosin (FLOMAX) 0.4 MG CAPS capsule Take 1 capsule by mouth  daily 90 capsule 0  . traMADol (ULTRAM) 50 MG tablet Take 1 tablet (50 mg total) by mouth every 6 (six) hours as needed. for pain 180 tablet 1   No current facility-administered medications on file prior to visit.     BP 110/64 (BP Location: Left Arm, Patient Position: Sitting, Cuff Size: Large)   Pulse 72   Temp 98.5 F (36.9 C) (Oral)   Resp 20   Ht 5' 9.5" (1.765 m)   Wt 279 lb 4 oz (126.7 kg)   SpO2 97%   BMI 40.65 kg/m     Review of Systems  Constitutional: Negative for appetite change, chills, fatigue and fever.  HENT: Negative for congestion, dental problem, ear pain, hearing loss, sore throat, tinnitus, trouble swallowing and voice change.   Eyes: Negative for pain, discharge and visual disturbance.  Respiratory: Negative for cough, chest tightness, wheezing and stridor.   Cardiovascular: Positive for chest pain. Negative for palpitations and leg swelling.  Gastrointestinal: Negative for abdominal distention, abdominal pain, blood in stool, constipation, diarrhea, nausea and vomiting.  Genitourinary: Negative for difficulty urinating, discharge, flank pain, genital sores, hematuria and urgency.  Musculoskeletal: Negative for arthralgias, back pain, gait problem, joint swelling, myalgias and neck stiffness.  Skin: Negative for rash.  Neurological: Negative for dizziness, syncope, speech difficulty, weakness, numbness and headaches.  Hematological: Negative for adenopathy. Does not bruise/bleed easily.  Psychiatric/Behavioral: Negative for behavioral problems and dysphoric mood. The patient is not nervous/anxious.        Objective:    Physical Exam  Constitutional: He is oriented to person, place, and time. He appears well-developed.  Blood pressure 110/64  HENT:  Head: Normocephalic.  Right Ear: External ear normal.  Left Ear: External ear normal.  Eyes: Conjunctivae and EOM are normal.  Neck: Normal range of motion.  Cardiovascular: Normal rate and normal heart sounds.   Pulmonary/Chest: Breath sounds normal.  Abdominal: Bowel sounds are normal.  Musculoskeletal: Normal range of motion. He exhibits no edema or tenderness.  Neurological: He is alert and oriented to person, place, and time.  Psychiatric: He has a normal mood and affect. His  behavior is normal.          Assessment & Plan:   Atypical chest pain.  Coronary artery disease status post CABG.  Will set up perfusion stress test for further evaluation Diabetes mellitus.  Will check hemoglobin A1c, urine for microalbumin and lipid profile Essential hypertension Dyslipidemia.  Continue statin therapy  CPX 3 months  [id number],PETER Pilar Plate

## 2016-07-21 NOTE — Progress Notes (Signed)
Pre visit review using our clinic review tool, if applicable. No additional management support is needed unless otherwise documented below in the visit note. 

## 2016-07-21 NOTE — Patient Instructions (Signed)
Limit your sodium (Salt) intake  Cardiac stress test as discussed  Report any new or worsening chest pain  Obtain a fresh supply of nitroglycerin  Return in 3 months for follow-up

## 2016-07-22 LAB — LIPID PANEL
CHOLESTEROL: 193 mg/dL (ref 125–200)
HDL: 70 mg/dL (ref >=40)
LDL Cholesterol: 101 mg/dL (ref <130)
Total CHOL/HDL Ratio: 2.8 Ratio (ref <=5.0)
Triglycerides: 110 mg/dL (ref <150)
VLDL: 22 mg/dL (ref <30)

## 2016-07-22 LAB — MICROALBUMIN / CREATININE URINE RATIO
Creatinine, Urine: 140 mg/dL (ref 20–370)
MICROALB/CREAT RATIO: 131 ug/mg{creat} — AB (ref <30)
Microalb, Ur: 18.3 mg/dL

## 2016-07-22 LAB — COMPREHENSIVE METABOLIC PANEL
ALBUMIN: 3.8 g/dL (ref 3.6–5.1)
ALK PHOS: 60 U/L (ref 40–115)
ALT: 29 U/L (ref 9–46)
AST: 25 U/L (ref 10–35)
BILIRUBIN TOTAL: 0.6 mg/dL (ref 0.2–1.2)
BUN: 24 mg/dL (ref 7–25)
CALCIUM: 9.5 mg/dL (ref 8.6–10.3)
CO2: 23 mmol/L (ref 20–31)
Chloride: 107 mmol/L (ref 98–110)
Creat: 1.29 mg/dL — ABNORMAL HIGH (ref 0.70–1.25)
Glucose, Bld: 109 mg/dL — ABNORMAL HIGH (ref 65–99)
Potassium: 5.1 mmol/L (ref 3.5–5.3)
Sodium: 139 mmol/L (ref 135–146)
TOTAL PROTEIN: 7.4 g/dL (ref 6.1–8.1)

## 2016-07-22 LAB — HEMOGLOBIN A1C
Hgb A1c MFr Bld: 6.8 % — ABNORMAL HIGH (ref <5.7)
MEAN PLASMA GLUCOSE: 148 mg/dL

## 2016-07-22 LAB — TSH: TSH: 2.31 mIU/L (ref 0.40–4.50)

## 2016-08-19 ENCOUNTER — Other Ambulatory Visit: Payer: Self-pay | Admitting: Internal Medicine

## 2016-09-06 ENCOUNTER — Other Ambulatory Visit: Payer: Self-pay | Admitting: Internal Medicine

## 2016-10-07 ENCOUNTER — Other Ambulatory Visit: Payer: Self-pay | Admitting: Internal Medicine

## 2016-10-17 ENCOUNTER — Other Ambulatory Visit: Payer: Self-pay | Admitting: Internal Medicine

## 2016-11-03 ENCOUNTER — Telehealth: Payer: Self-pay | Admitting: Internal Medicine

## 2016-11-03 NOTE — Telephone Encounter (Addendum)
Pt needs refill on tramadol #90 w/refills sent to [facility]. Pt wife is aware md out of office

## 2016-11-07 ENCOUNTER — Other Ambulatory Visit: Payer: Self-pay | Admitting: Internal Medicine

## 2016-11-07 MED ORDER — TRAMADOL HCL 50 MG PO TABS: 50.0000 mg | ORAL_TABLET | Freq: Four times a day (QID) | ORAL | 1 refills | Status: DC | PRN

## 2016-11-07 NOTE — Telephone Encounter (Signed)
Rx printed, awaiting MD signature.  

## 2016-11-07 NOTE — Telephone Encounter (Signed)
Rx faxed over to pharmacy, pt aware.

## 2016-12-01 ENCOUNTER — Ambulatory Visit (INDEPENDENT_AMBULATORY_CARE_PROVIDER_SITE_OTHER): Payer: BLUE CROSS/BLUE SHIELD | Admitting: Internal Medicine

## 2016-12-01 ENCOUNTER — Encounter: Payer: Self-pay | Admitting: Internal Medicine

## 2016-12-01 VITALS — BP 146/68 | HR 70 | Temp 98.1°F | Ht 69.0 in | Wt 287.0 lb

## 2016-12-01 DIAGNOSIS — E1151 Type 2 diabetes mellitus with diabetic peripheral angiopathy without gangrene: Secondary | ICD-10-CM

## 2016-12-01 DIAGNOSIS — E785 Hyperlipidemia, unspecified: Secondary | ICD-10-CM

## 2016-12-01 DIAGNOSIS — I2510 Atherosclerotic heart disease of native coronary artery without angina pectoris: Secondary | ICD-10-CM

## 2016-12-01 DIAGNOSIS — Z0000 Encounter for general adult medical examination without abnormal findings: Secondary | ICD-10-CM | POA: Diagnosis not present

## 2016-12-01 DIAGNOSIS — Z23 Encounter for immunization: Secondary | ICD-10-CM | POA: Diagnosis not present

## 2016-12-01 DIAGNOSIS — E1121 Type 2 diabetes mellitus with diabetic nephropathy: Secondary | ICD-10-CM | POA: Diagnosis not present

## 2016-12-01 DIAGNOSIS — I10 Essential (primary) hypertension: Secondary | ICD-10-CM | POA: Diagnosis not present

## 2016-12-01 DIAGNOSIS — E1129 Type 2 diabetes mellitus with other diabetic kidney complication: Secondary | ICD-10-CM | POA: Insufficient documentation

## 2016-12-01 DIAGNOSIS — I6529 Occlusion and stenosis of unspecified carotid artery: Secondary | ICD-10-CM

## 2016-12-01 NOTE — Progress Notes (Signed)
Subjective:    Patient ID: [name], male    DOB: 1946/06/01, 71 y.o.   MRN: [id number]  HPI 71 year old patient who is seen today for follow-up and for a Medicare wellness visit. He has a history of coronary artery disease.  He was seen 4 months ago with atypical chest pain and a nuclear stress test was scheduled, but never performed.  He has had no recurrent chest pain He does have a history of moderate left carotid artery stenosis.  His last carotid artery Doppler study 2014.  Denies any focal neurological symptoms Last colonoscopy 2013  Past Medical History:  Diagnosis Date  . CAD, ARTERY BYPASS GRAFT 02/10/2008  . CAROTID ARTERY STENOSIS, WITHOUT INFARCTION 09/22/2008  . CORONARY ARTERY DISEASE 04/03/2008  . Diabetes mellitus    "borderline"  . ERECTILE DYSFUNCTION 01/14/2009  . GOUT 01/16/2008  . HERPES ZOSTER 04/15/2010  . HYPERLIPIDEMIA 01/16/2008  . HYPERTENSION 01/16/2008  . IMPAIRED GLUCOSE TOLERANCE 08/22/2010  . Shingles 2011   left chest  . Ulcer ([employer]) 30 yrs ago   stomach     Social History   Social History  . Marital status: Married    Spouse name: N/A  . Number of children: 3  . Years of education: N/A   Occupational History  .  [employer]   Social History Main Topics  . Smoking status: Former Smoker    Packs/day: 1.00    Years: 10.00    Types: Cigarettes    Quit date: 07/27/1999  . Smokeless tobacco: Never Used  . Alcohol use 0.0 oz/week     Comment: 4 beers a day  . Drug use: No  . Sexual activity: Not on file   Other Topics Concern  . Not on file   Social History Narrative  . No narrative on file    Past Surgical History:  Procedure Laterality Date  . COLONOSCOPY     3 months ago  . CORONARY ARTERY BYPASS GRAFT  2009   vessels x3  . EAR CYST EXCISION N/A 07/29/2013   Procedure: excision of urachal cyst flexible cystoscopy insertion of foley cath;  Surgeon: Fredricka Bonine, MD;  Location: [facility];  Service: Urology;   Laterality: N/A;  . LAPAROSCOPY N/A 07/29/2013   Procedure: LAPAROSCOPY DIAGNOSTIC  laparoscopic excision of urachal cyst ;  Surgeon: Madilyn Hook, DO;  Location: [facility];  Service: General;  Laterality: N/A;  . LAPAROTOMY  08/25/2012   Procedure: EXPLORATORY LAPAROTOMY;  Surgeon: Madilyn Hook, DO;  Location: [facility];  Service: General;  Laterality: N/A;  incision and drainage abdominal wall abcessand intra abdominal wall abcess  . PARTIAL GASTRECTOMY  1974   bleeding ulcers  . urachal  2014   Urachal cyst removal    Family History  Problem Relation Age of Onset  . Heart disease Mother   . Heart disease Sister   . Colon cancer Neg Hx     No Known Allergies  Current Outpatient Prescriptions on File Prior to Visit  Medication Sig Dispense Refill  . allopurinol (ZYLOPRIM) 100 MG tablet TAKE 1 TABLET BY MOUTH  DAILY 90 tablet 1  . amLODipine (NORVASC) 10 MG tablet Take 1 tablet by mouth  daily 90 tablet 0  . aspirin EC 325 MG tablet Take 325 mg by mouth every morning.     Marland Kitchen atorvastatin (LIPITOR) 40 MG tablet TAKE 1 TABLET BY MOUTH  EVERY MORNING 90 tablet 3  . Cholecalciferol (VITAMIN D3) 1000 UNITS CAPS Take  1 capsule by mouth daily.    Marland Kitchen glipiZIDE (GLUCOTROL XL) 2.5 MG 24 hr tablet Take 1 tablet (2.5 mg total) by mouth every evening. 30 tablet 5  . glucose blood (ONE TOUCH ULTRA TEST) test strip 1 each by Other route daily. Use as instructed 100 each 12  . ibuprofen (ADVIL,MOTRIN) 200 MG tablet Take 400 mg by mouth every 6 (six) hours as needed. For pain    . Lancets (ONETOUCH ULTRASOFT) lancets 1 each by Other route daily. Use as instructed 100 each 12  . lisinopril (PRINIVIL,ZESTRIL) 10 MG tablet Take 1 tablet (10 mg total) by mouth daily. 90 tablet 3  . metFORMIN (GLUCOPHAGE) 1000 MG tablet Take 1 tablet (1,000 mg total) by mouth 2 (two) times daily with a meal. 180 tablet 1  . metoprolol (LOPRESSOR) 50 MG tablet TAKE 1 TABLET BY MOUTH TWO  TIMES DAILY 180 tablet 1  . nitroGLYCERIN  (NITROSTAT) 0.4 MG SL tablet Place 1 tablet (0.4 mg total) under the tongue every 5 (five) minutes as needed for chest pain. 15 tablet 5  . Omega 3-6-9 Fatty Acids (OMEGA-3 & OMEGA-6 FISH OIL PO) Take 1,000 mg by mouth daily.     . tamsulosin (FLOMAX) 0.4 MG CAPS capsule TAKE 1 CAPSULE BY MOUTH  DAILY 90 capsule 1  . traMADol (ULTRAM) 50 MG tablet Take 1 tablet (50 mg total) by mouth every 6 (six) hours as needed. for pain 180 tablet 1   No current facility-administered medications on file prior to visit.     BP (!) 146/68 (BP Location: Left Arm, Patient Position: Sitting, Cuff Size: Large)   Pulse 70   Temp 98.1 F (36.7 C) (Oral)   Ht 5\' 9"  (1.753 m)   Wt 287 lb (130.2 kg)   SpO2 96%   BMI 42.38 kg/m   Medicare wellness visit  1. Risk factors, based on past  M,S,F history.  Patient has known coronary artery disease status post CABG.  Risk factors include diabetes dyslipidemia and hypertension 2.  Physical activities: fairly sedentary but no activity restrictions  3.  Depression/mood:no history of major depression or mood disorder  4.  Hearing:no deficits  5.  ADL's:independent  6.  Fall risk:moderate due to weight  7.  Home safety:no problems identified 8.  Height weight, and visual acuity;height and weight stable no change in visual acuity  9.  Counseling:heart healthy diet and weight loss encouraged more rigorous activities.  Encouraged  10. Lab orders based on risk factors:will follow-up hemoglobin A1c  11. Referral :schedule follow carotid artery Doppler study  12. Care plan:continue efforts at aggressive risk factor modification  13. Cognitive assessment: alert and oriented with normal affect no cognitive dysfunction  14. Screening: Patient provided with a written and personalized 5-10 year screening schedule in the AVS.    15. Provider List Update: primary care and radiology   Review of Systems  Constitutional: Negative for appetite change, chills, fatigue  and fever.  HENT: Negative for congestion, dental problem, ear pain, hearing loss, sore throat, tinnitus, trouble swallowing and voice change.   Eyes: Negative for pain, discharge and visual disturbance.  Respiratory: Negative for cough, chest tightness, wheezing and stridor.   Cardiovascular: Negative for chest pain, palpitations and leg swelling.  Gastrointestinal: Negative for abdominal distention, abdominal pain, blood in stool, constipation, diarrhea, nausea and vomiting.  Genitourinary: Positive for frequency. Negative for difficulty urinating, discharge, flank pain, genital sores, hematuria and urgency.  Musculoskeletal: Negative for arthralgias, back pain, gait problem,  joint swelling, myalgias and neck stiffness.  Skin: Negative for rash.  Neurological: Negative for dizziness, syncope, speech difficulty, weakness, numbness and headaches.  Hematological: Negative for adenopathy. Does not bruise/bleed easily.  Psychiatric/Behavioral: Negative for behavioral problems and dysphoric mood. The patient is not nervous/anxious.        Objective:   Physical Exam  Constitutional: He appears well-developed and well-nourished.  Blood pressure low normal Obese.  Weight 2 87    HENT:  Head: Normocephalic and atraumatic.  Right Ear: External ear normal.  Left Ear: External ear normal.  Nose: Nose normal.  Mouth/Throat: Oropharynx is clear and moist.  Upper dentures in place  Eyes: Conjunctivae and EOM are normal. Pupils are equal, round, and reactive to light. No scleral icterus.  Neck: Normal range of motion. Neck supple. No JVD present. No thyromegaly present.  Cardiovascular: Regular rhythm, normal heart sounds and intact distal pulses.  Exam reveals no gallop and no friction rub.   No murmur heard. Status post sternotomy  Dorsalis pedis pulses intact.  Posterior tibial pulses not easily palpable  Pulmonary/Chest: Effort normal and breath sounds normal. He exhibits no tenderness.    Abdominal: Soft. Bowel sounds are normal. He exhibits no distension and no mass. There is no tenderness.  Obese.  Midline scar.  Upper abdomen  Genitourinary: Prostate normal and penis normal.  Musculoskeletal: Normal range of motion. He exhibits no edema or tenderness.  Lymphadenopathy:    He has no cervical adenopathy.  Neurological: He is alert. He has normal reflexes. No cranial nerve deficit. Coordination normal.  Skin: Skin is warm and dry. No rash noted.  Psychiatric: He has a normal mood and affect. His behavior is normal.          Assessment & Plan:   Preventive health examination Medicare wellness visit Coronary artery disease.  Continue efforts at aggressive risk factor modification Obesity.  Weight loss encouraged Hypertension, well-controlled Dyslipidemia.  Continue statin therapy Carotid artery stenosis.  Will schedule follow carotid artery Doppler study  Recheck 4 months  Jabar Krysiak Pilar Plate

## 2016-12-01 NOTE — Addendum Note (Signed)
Addended by: Tomi Likens on: 12/01/2016 03:43 PM   Modules accepted: Orders

## 2016-12-01 NOTE — Patient Instructions (Signed)
Limit your sodium (Salt) intake  You need to lose weight.  Consider a lower calorie diet and regular exercise.    It is important that you exercise regularly, at least 20 minutes 3 to 4 times per week.  If you develop chest pain or shortness of breath seek  medical attention.   Please check your hemoglobin A1c every 3 months   

## 2016-12-01 NOTE — Progress Notes (Signed)
Pre visit review using our clinic review tool, if applicable. No additional management support is needed unless otherwise documented below in the visit note. 

## 2016-12-01 NOTE — Addendum Note (Signed)
Addended by: Tomi Likens on: 12/01/2016 03:41 PM   Modules accepted: Orders

## 2016-12-02 LAB — BASIC METABOLIC PANEL
BUN: 21 mg/dL (ref 7–25)
CALCIUM: 9.4 mg/dL (ref 8.6–10.3)
CO2: 21 mmol/L (ref 20–31)
CREATININE: 1.1 mg/dL (ref 0.70–1.25)
Chloride: 107 mmol/L (ref 98–110)
Glucose, Bld: 134 mg/dL — ABNORMAL HIGH (ref 65–99)
Potassium: 4.8 mmol/L (ref 3.5–5.3)
Sodium: 139 mmol/L (ref 135–146)

## 2016-12-02 LAB — HEMOGLOBIN A1C
HEMOGLOBIN A1C: 7 % — AB (ref <5.7)
MEAN PLASMA GLUCOSE: 154 mg/dL

## 2016-12-21 ENCOUNTER — Telehealth (HOSPITAL_COMMUNITY): Payer: Self-pay | Admitting: Internal Medicine

## 2016-12-21 NOTE — Telephone Encounter (Signed)
Pt has been contacted on 12/05/16,12/18/16,12/21/16. Messages have been left every day and the patient has failed to respond to messages. Patient will be removed from the workqueue due to failure to contact.

## 2017-01-03 ENCOUNTER — Telehealth (HOSPITAL_COMMUNITY): Payer: Self-pay | Admitting: *Deleted

## 2017-01-03 NOTE — Telephone Encounter (Signed)
Left message on voicemail in reference to upcoming appointment scheduled for 01/08/17 Phone number given for a call back so details instructions can be given.  [name]

## 2017-01-08 ENCOUNTER — Ambulatory Visit (HOSPITAL_COMMUNITY): Payer: BLUE CROSS/BLUE SHIELD | Attending: Cardiovascular Disease

## 2017-01-08 DIAGNOSIS — R9439 Abnormal result of other cardiovascular function study: Secondary | ICD-10-CM | POA: Insufficient documentation

## 2017-01-08 DIAGNOSIS — Z951 Presence of aortocoronary bypass graft: Secondary | ICD-10-CM | POA: Insufficient documentation

## 2017-01-08 DIAGNOSIS — I779 Disorder of arteries and arterioles, unspecified: Secondary | ICD-10-CM | POA: Insufficient documentation

## 2017-01-08 DIAGNOSIS — I10 Essential (primary) hypertension: Secondary | ICD-10-CM | POA: Diagnosis not present

## 2017-01-08 DIAGNOSIS — E0859 Diabetes mellitus due to underlying condition with other circulatory complications: Secondary | ICD-10-CM | POA: Diagnosis not present

## 2017-01-08 DIAGNOSIS — I2510 Atherosclerotic heart disease of native coronary artery without angina pectoris: Secondary | ICD-10-CM | POA: Insufficient documentation

## 2017-01-08 DIAGNOSIS — R0789 Other chest pain: Secondary | ICD-10-CM | POA: Insufficient documentation

## 2017-01-08 DIAGNOSIS — E1151 Type 2 diabetes mellitus with diabetic peripheral angiopathy without gangrene: Secondary | ICD-10-CM | POA: Diagnosis not present

## 2017-01-08 MED ORDER — TECHNETIUM TC 99M TETROFOSMIN IV KIT
33.0000 | PACK | Freq: Once | INTRAVENOUS | Status: AC | PRN
  Administered 2017-01-08: 33 via INTRAVENOUS
  Filled 2017-01-08: qty 33

## 2017-01-08 MED ORDER — REGADENOSON 0.4 MG/5ML IV SOLN
0.4000 mg | Freq: Once | INTRAVENOUS | Status: AC
  Administered 2017-01-08: 0.4 mg via INTRAVENOUS

## 2017-01-09 ENCOUNTER — Ambulatory Visit (HOSPITAL_COMMUNITY): Payer: BLUE CROSS/BLUE SHIELD | Attending: Cardiology

## 2017-01-09 LAB — MYOCARDIAL PERFUSION IMAGING
CHL CUP NUCLEAR SDS: 4
CHL CUP NUCLEAR SRS: 8
CSEPPHR: 89 {beats}/min
LHR: 0.36
LV dias vol: 169 mL (ref 62–150)
LVSYSVOL: 94 mL
NUC STRESS TID: 1.01
Rest HR: 67 {beats}/min
SSS: 12

## 2017-01-09 MED ORDER — TECHNETIUM TC 99M TETROFOSMIN IV KIT
32.0000 | PACK | Freq: Once | INTRAVENOUS | Status: AC | PRN
  Administered 2017-01-09: 32 via INTRAVENOUS
  Filled 2017-01-09: qty 32

## 2017-01-19 ENCOUNTER — Telehealth: Payer: Self-pay | Admitting: Internal Medicine

## 2017-01-19 DIAGNOSIS — R9439 Abnormal result of other cardiovascular function study: Secondary | ICD-10-CM

## 2017-01-19 NOTE — Telephone Encounter (Signed)
Paty pt returned your call needing more information.

## 2017-01-22 NOTE — Telephone Encounter (Signed)
Returned pt's call... Pt was unable to make follow up appointment with Dr Burt Knack at cardiology. After I called cardiology and spoke with [facility], she state that [name] canceled his new pt appt in 2015 and Dr Burt Knack is no longer accepting new pts. An appointment was made with [name] instead. I called pt and made him aware of his appointment for Friday 01/26/17 at 2:15 pm. Pt verbalized understanding

## 2017-01-26 ENCOUNTER — Ambulatory Visit (INDEPENDENT_AMBULATORY_CARE_PROVIDER_SITE_OTHER): Payer: BLUE CROSS/BLUE SHIELD | Admitting: Cardiovascular Disease

## 2017-01-26 ENCOUNTER — Encounter: Payer: Self-pay | Admitting: Cardiovascular Disease

## 2017-01-26 DIAGNOSIS — I200 Unstable angina: Secondary | ICD-10-CM

## 2017-01-26 DIAGNOSIS — I10 Essential (primary) hypertension: Secondary | ICD-10-CM

## 2017-01-26 DIAGNOSIS — I6529 Occlusion and stenosis of unspecified carotid artery: Secondary | ICD-10-CM

## 2017-01-26 DIAGNOSIS — E785 Hyperlipidemia, unspecified: Secondary | ICD-10-CM

## 2017-01-26 DIAGNOSIS — I25700 Atherosclerosis of coronary artery bypass graft(s), unspecified, with unstable angina pectoris: Secondary | ICD-10-CM | POA: Diagnosis not present

## 2017-01-26 NOTE — Assessment & Plan Note (Signed)
History of mild bilateral ICA stenosis. There is a blood pressure differential with a less than left retrograde right vertebral flow although there is no evidence of right subclavian stenosis.

## 2017-01-26 NOTE — Assessment & Plan Note (Signed)
History of dyslipidemia on statin therapy with recent lipid profile performed 07/16/16 revealed a total cholesterol of 193, LDL 101 and HDL of 70.

## 2017-01-26 NOTE — Progress Notes (Signed)
01/26/2017 [name]   06-05-1946  [id number]  Primary Physician Nyoka Cowden, MD Primary Cardiologist: Lorretta Harp MD Renae Gloss  HPI:  [name] is a 71 year old moderately overweight married male father of 2, grandfather 2 grandchildren referred by Dr. Orvilla Fus for cardiovascular evaluation because of an episode of chest pain and an abnormal Myoview stress test. He has a history of treated hypertension, diabetes and hyperlipidemia. He has known CAD status post cardiac catheterization performed by Dr. Burt Knack 01/28/08 revealing three-vessel disease with preserved LV function. He also underwent coronary artery bypass grafting by Dr. Servando Snare were/6/09 the LIMA to the LAD, vein graft sequentially to an intermediate branch and obtuse marginal branch as well as the distal right coronary artery. He's been well since. Episode of chest pain several months ago and a recent Myoview stress test performed 01/08/17 showed inferolateral scar with moderate peri-infarct ischemia.   Current Outpatient Prescriptions  Medication Sig Dispense Refill  . allopurinol (ZYLOPRIM) 100 MG tablet TAKE 1 TABLET BY MOUTH  DAILY 90 tablet 1  . amLODipine (NORVASC) 10 MG tablet Take 1 tablet by mouth  daily 90 tablet 0  . aspirin EC 325 MG tablet Take 325 mg by mouth every morning.     Marland Kitchen atorvastatin (LIPITOR) 40 MG tablet TAKE 1 TABLET BY MOUTH  EVERY MORNING 90 tablet 3  . Cholecalciferol (VITAMIN D3) 1000 UNITS CAPS Take 1 capsule by mouth daily.    Marland Kitchen glipiZIDE (GLUCOTROL XL) 2.5 MG 24 hr tablet Take 1 tablet (2.5 mg total) by mouth every evening. 30 tablet 5  . glucose blood (ONE TOUCH ULTRA TEST) test strip 1 each by Other route daily. Use as instructed 100 each 12  . ibuprofen (ADVIL,MOTRIN) 200 MG tablet Take 400 mg by mouth every 6 (six) hours as needed. For pain    . Lancets (ONETOUCH ULTRASOFT) lancets 1 each by Other route daily. Use as instructed 100 each 12  .  lisinopril (PRINIVIL,ZESTRIL) 10 MG tablet Take 1 tablet (10 mg total) by mouth daily. 90 tablet 3  . metFORMIN (GLUCOPHAGE) 1000 MG tablet Take 1 tablet (1,000 mg total) by mouth 2 (two) times daily with a meal. 180 tablet 1  . metoprolol (LOPRESSOR) 50 MG tablet TAKE 1 TABLET BY MOUTH TWO  TIMES DAILY 180 tablet 1  . nitroGLYCERIN (NITROSTAT) 0.4 MG SL tablet Place 1 tablet (0.4 mg total) under the tongue every 5 (five) minutes as needed for chest pain. 15 tablet 5  . Omega 3-6-9 Fatty Acids (OMEGA-3 & OMEGA-6 FISH OIL PO) Take 1,000 mg by mouth daily.     . tamsulosin (FLOMAX) 0.4 MG CAPS capsule TAKE 1 CAPSULE BY MOUTH  DAILY 90 capsule 1  . traMADol (ULTRAM) 50 MG tablet Take 1 tablet (50 mg total) by mouth every 6 (six) hours as needed. for pain 180 tablet 1   No current facility-administered medications for this visit.     No Known Allergies  Social History   Social History  . Marital status: Married    Spouse name: N/A  . Number of children: 3  . Years of education: N/A   Occupational History  .  [employer]   Social History Main Topics  . Smoking status: Former Smoker    Packs/day: 1.00    Years: 10.00    Types: Cigarettes    Quit date: 07/27/1999  . Smokeless tobacco: Never Used  . Alcohol use 0.0 oz/week  Comment: 4 beers a day  . Drug use: No  . Sexual activity: Not on file   Other Topics Concern  . Not on file   Social History Narrative  . No narrative on file     Review of Systems: General: negative for chills, fever, night sweats or weight changes.  Cardiovascular: negative for chest pain, dyspnea on exertion, edema, orthopnea, palpitations, paroxysmal nocturnal dyspnea or shortness of breath Dermatological: negative for rash Respiratory: negative for cough or wheezing Urologic: negative for hematuria Abdominal: negative for nausea, vomiting, diarrhea, bright red blood per rectum, melena, or hematemesis Neurologic: negative for visual changes,  syncope, or dizziness All other systems reviewed and are otherwise negative except as noted above.    Blood pressure (!) 152/76, pulse 82, height 5' 9.5" (1.765 m), weight 289 lb (131.1 kg), SpO2 93 %.  General appearance: alert and no distress Neck: no adenopathy, no carotid bruit, no JVD, supple, symmetrical, trachea midline and thyroid not enlarged, symmetric, no tenderness/mass/nodules Lungs: clear to auscultation bilaterally Heart: regular rate and rhythm, S1, S2 normal, no murmur, click, rub or gallop Extremities: extremities normal, atraumatic, no cyanosis or edema  EKG not performed today  ASSESSMENT AND PLAN:   Dyslipidemia History of dyslipidemia on statin therapy with recent lipid profile performed 07/16/16 revealed a total cholesterol of 193, LDL 101 and HDL of 70.  Essential hypertension History of hypertension blood pressure measured at 152/76. He is on metoprolol and lisinopril. Continue current meds at current dosing  CAD, ARTERY BYPASS GRAFT History of CAD status post coronary artery bypass grafting by Dr. Servando Snare 02/10/08 with a LIMA to the LAD, vein graft to an intermediate branch, obtuse marginal branch and distal right coronary artery. This was performed after catheterization performed Dr. Burt Knack 01/28/08 revealed severe three-vessel disease with preserved LV function. He stated well since without recurrent angina. Today one episode of chest pain several months ago and had a Myoview stress test ordered by his PCP Dr. Ashley Royalty 01/08/17 revealing inferolateral scar with moderate peri-infarct ischemia. Given the fact that this was an isolated episode of chest pain I do not feel compelled to perform cardiac catheterization at this time.  Occlusion and stenosis of carotid artery History of mild bilateral ICA stenosis. There is a blood pressure differential with a less than left retrograde right vertebral flow although there is no evidence of right subclavian  stenosis.      Lorretta Harp MD FACP,FACC,FAHA, [facility] 01/26/2017 3:12 PM

## 2017-01-26 NOTE — Patient Instructions (Signed)

## 2017-01-26 NOTE — Assessment & Plan Note (Signed)
History of CAD status post coronary artery bypass grafting by Dr. Servando Snare 02/10/08 with a LIMA to the LAD, vein graft to an intermediate branch, obtuse marginal branch and distal right coronary artery. This was performed after catheterization performed Dr. Burt Knack 01/28/08 revealed severe three-vessel disease with preserved LV function. He stated well since without recurrent angina. Today one episode of chest pain several months ago and had a Myoview stress test ordered by his PCP Dr. Ashley Royalty 01/08/17 revealing inferolateral scar with moderate peri-infarct ischemia. Given the fact that this was an isolated episode of chest pain I do not feel compelled to perform cardiac catheterization at this time.

## 2017-01-26 NOTE — Assessment & Plan Note (Signed)
History of hypertension blood pressure measured at 152/76. He is on metoprolol and lisinopril. Continue current meds at current dosing

## 2017-01-30 ENCOUNTER — Other Ambulatory Visit: Payer: Self-pay | Admitting: Internal Medicine

## 2017-02-01 ENCOUNTER — Ambulatory Visit (HOSPITAL_COMMUNITY)
Admission: RE | Admit: 2017-02-01 | Discharge: 2017-02-01 | Disposition: A | Discharge status: Home / Self Care | Payer: BLUE CROSS/BLUE SHIELD | Source: Ambulatory Visit | Attending: Vascular Surgery | Admitting: Vascular Surgery

## 2017-02-08 LAB — HM DIABETES EYE EXAM

## 2017-02-16 ENCOUNTER — Encounter: Payer: Self-pay | Admitting: Family Medicine

## 2017-03-27 ENCOUNTER — Other Ambulatory Visit: Payer: Self-pay | Admitting: Internal Medicine

## 2017-03-27 MED ORDER — TRAMADOL HCL 50 MG PO TABS: 50.0000 mg | ORAL_TABLET | Freq: Four times a day (QID) | ORAL | 1 refills | Status: DC | PRN

## 2017-04-04 ENCOUNTER — Telehealth: Payer: Self-pay | Admitting: Internal Medicine

## 2017-04-04 NOTE — Telephone Encounter (Signed)
Rx was sent on 03/27/17 for #180 tablets and 1 refill.   Too early to refill

## 2017-04-04 NOTE — Telephone Encounter (Signed)
Pt need new Rx for tramadol   Pharm:  [facility]

## 2017-04-05 NOTE — Telephone Encounter (Signed)
Rx sent in on 04/27/17

## 2017-04-06 ENCOUNTER — Other Ambulatory Visit: Payer: Self-pay | Admitting: Internal Medicine

## 2017-04-06 NOTE — Telephone Encounter (Signed)
Refill was never received.  Rx called in.

## 2017-04-06 NOTE — Telephone Encounter (Signed)
° ° ° °  Pt request refill of the following:  traMADol (ULTRAM) 50 MG tablet   Phamacy:  [facility]

## 2017-04-26 ENCOUNTER — Encounter: Payer: Self-pay | Admitting: Gastroenterology

## 2017-05-14 ENCOUNTER — Other Ambulatory Visit: Payer: Self-pay | Admitting: Internal Medicine

## 2017-06-23 ENCOUNTER — Other Ambulatory Visit: Payer: Self-pay | Admitting: Internal Medicine

## 2017-07-31 ENCOUNTER — Telehealth: Payer: Self-pay | Admitting: Internal Medicine

## 2017-07-31 MED ORDER — TRAMADOL HCL 50 MG PO TABS: 50.0000 mg | ORAL_TABLET | Freq: Four times a day (QID) | ORAL | 1 refills | Status: DC | PRN

## 2017-07-31 NOTE — Telephone Encounter (Signed)
Rx printed awaiting to signed.

## 2017-07-31 NOTE — Telephone Encounter (Signed)
Patients wife called to say that the patientja needs his refill on the below medication.   traMADol (ULTRAM) 50 MG tablet

## 2017-08-02 NOTE — Telephone Encounter (Signed)
rx was faxed on 08/01/17.

## 2017-08-03 ENCOUNTER — Encounter: Payer: Self-pay | Admitting: Cardiovascular Disease

## 2017-08-03 ENCOUNTER — Ambulatory Visit (INDEPENDENT_AMBULATORY_CARE_PROVIDER_SITE_OTHER): Payer: BLUE CROSS/BLUE SHIELD | Admitting: Cardiovascular Disease

## 2017-08-03 VITALS — BP 142/78 | HR 78 | Ht 70.5 in | Wt 284.0 lb

## 2017-08-03 DIAGNOSIS — I6529 Occlusion and stenosis of unspecified carotid artery: Secondary | ICD-10-CM

## 2017-08-03 DIAGNOSIS — I200 Unstable angina: Secondary | ICD-10-CM

## 2017-08-03 DIAGNOSIS — I25700 Atherosclerosis of coronary artery bypass graft(s), unspecified, with unstable angina pectoris: Secondary | ICD-10-CM | POA: Diagnosis not present

## 2017-08-03 DIAGNOSIS — E785 Hyperlipidemia, unspecified: Secondary | ICD-10-CM

## 2017-08-03 DIAGNOSIS — I10 Essential (primary) hypertension: Secondary | ICD-10-CM | POA: Diagnosis not present

## 2017-08-03 NOTE — Assessment & Plan Note (Signed)
History of essential hypertension blood pressure 142/78. He is on amlodipine, lisinopril, and metoprolol. Current meds at current dosing

## 2017-08-03 NOTE — Telephone Encounter (Signed)
[facility] states [facility] have not received this Rx traMADol (ULTRAM) 50 MG tablet  Please fax to  [phone]

## 2017-08-03 NOTE — Progress Notes (Signed)
08/03/2017 Markus J Folks   [birth year]  [id number]  Primary Physician Marletta Lor, MD Primary Cardiologist: Lorretta Harp MD Lupe Carney, [place]  HPI:  [name] is a 71 y.o. male  moderately overweight married male father of 2, grandfather 2 grandchildren referred by Dr. Orvilla Fus for cardiovascular evaluation because of an episode of chest pain and an abnormal Myoview stress test. I last saw him in the office 01/26/17. He has a history of treated hypertension, diabetes and hyperlipidemia. He has known CAD status post cardiac catheterization performed by Dr. Burt Knack 01/28/08 revealing three-vessel disease with preserved LV function. He also underwent coronary artery bypass grafting by Dr. Servando Snare were/6/09 the LIMA to the LAD, vein graft sequentially to an intermediate branch and obtuse marginal branch as well as the distal right coronary artery. He's been well since. Episode of chest pain several months ago and a recent Myoview stress test performed 01/08/17 showed inferolateral scar with moderate peri-infarct ischemia. Since I saw him back in March he's been completely asymptomatic denying chest pain or shortness of breath.   Current Meds  Medication Sig  . allopurinol (ZYLOPRIM) 100 MG tablet TAKE 1 TABLET BY MOUTH  DAILY  . amLODipine (NORVASC) 10 MG tablet TAKE 1 TABLET BY MOUTH  DAILY  . aspirin EC 325 MG tablet Take 325 mg by mouth every morning.   Marland Kitchen atorvastatin (LIPITOR) 40 MG tablet TAKE 1 TABLET BY MOUTH  EVERY MORNING  . Cholecalciferol (VITAMIN D3) 1000 UNITS CAPS Take 1 capsule by mouth daily.  Marland Kitchen glipiZIDE (GLUCOTROL XL) 2.5 MG 24 hr tablet Take 1 tablet (2.5 mg total) by mouth every evening.  Marland Kitchen glucose blood (ONE TOUCH ULTRA TEST) test strip 1 each by Other route daily. Use as instructed  . ibuprofen (ADVIL,MOTRIN) 200 MG tablet Take 400 mg by mouth every 6 (six) hours as needed. For pain  . Lancets (ONETOUCH ULTRASOFT) lancets 1 each by Other route  daily. Use as instructed  . lisinopril (PRINIVIL,ZESTRIL) 10 MG tablet Take 1 tablet (10 mg total) by mouth daily.  . metFORMIN (GLUCOPHAGE) 1000 MG tablet Take 1 tablet (1,000 mg total) by mouth 2 (two) times daily with a meal.  . metoprolol tartrate (LOPRESSOR) 50 MG tablet TAKE 1 TABLET BY MOUTH TWO  TIMES DAILY  . nitroGLYCERIN (NITROSTAT) 0.4 MG SL tablet Place 1 tablet (0.4 mg total) under the tongue every 5 (five) minutes as needed for chest pain.  . Omega 3-6-9 Fatty Acids (OMEGA-3 & OMEGA-6 FISH OIL PO) Take 1,000 mg by mouth daily.   . tamsulosin (FLOMAX) 0.4 MG CAPS capsule TAKE 1 CAPSULE BY MOUTH  DAILY  . traMADol (ULTRAM) 50 MG tablet Take 1 tablet (50 mg total) by mouth every 6 (six) hours as needed. for pain     No Known Allergies  Social History   Social History  . Marital status: Married    Spouse name: N/A  . Number of children: 3  . Years of education: N/A   Occupational History  .  [employer]   Social History Main Topics  . Smoking status: Former Smoker    Packs/day: 1.00    Years: 10.00    Types: Cigarettes    Quit date: 07/27/1999  . Smokeless tobacco: Never Used  . Alcohol use 0.0 oz/week     Comment: 4 beers a day  . Drug use: No  . Sexual activity: Not on file   Other Topics Concern  .  Not on file   Social History Narrative   epworth sleepiness scale score: 4     Review of Systems: General: negative for chills, fever, night sweats or weight changes.  Cardiovascular: negative for chest pain, dyspnea on exertion, edema, orthopnea, palpitations, paroxysmal nocturnal dyspnea or shortness of breath Dermatological: negative for rash Respiratory: negative for cough or wheezing Urologic: negative for hematuria Abdominal: negative for nausea, vomiting, diarrhea, bright red blood per rectum, melena, or hematemesis Neurologic: negative for visual changes, syncope, or dizziness All other systems reviewed and are otherwise negative except as noted  above.    Blood pressure (!) 142/78, pulse 78, height 5' 10.5" (1.791 m), weight 284 lb (128.8 kg).  General appearance: alert and no distress Neck: no adenopathy, no carotid bruit, no JVD, supple, symmetrical, trachea midline and thyroid not enlarged, symmetric, no tenderness/mass/nodules Lungs: clear to auscultation bilaterally Heart: regular rate and rhythm, S1, S2 normal, no murmur, click, rub or gallop Extremities: extremities normal, atraumatic, no cyanosis or edema  EKG sinus rhythm at 78 without ST or T-wave changes. I personally reviewed this EKG.  ASSESSMENT AND PLAN:   Dyslipidemia History of hyperlipidemia on statin therapy followed by his PCP. His left liver profiles one year ago which time he had an LDL of 101. We will recheck a lipid and liver profile.  Essential hypertension History of essential hypertension blood pressure 142/78. He is on amlodipine, lisinopril, and metoprolol. Current meds at current dosing  CAD, ARTERY BYPASS GRAFT History of CAD status post cardiac catheterization performed with Dr. Burt Knack 01/28/08 revealing three-vessel disease with preserved LV function. He underwent coronary artery bypass grafting by Dr. Servando Snare with a LIMA to his LAD, vein sequentially to an intermediate branch, obtuse marginal branch as well as the distal right coronary artery. A Myoview stress test performed 01/08/17 revealed inferolateral scar with mild peri-infarct ischemia. He's had no recurrent symptoms.  Occlusion and stenosis of carotid artery History of carotid artery disease with duplex performed 02/01/17 revealing mild bilateral ICA stenosis. We will repeat in one year.      Lorretta Harp MD FACP,FACC,FAHA, [facility] 08/03/2017 3:47 PM

## 2017-08-03 NOTE — Assessment & Plan Note (Signed)
History of carotid artery disease with duplex performed 02/01/17 revealing mild bilateral ICA stenosis. We will repeat in one year.

## 2017-08-03 NOTE — Assessment & Plan Note (Signed)
History of CAD status post cardiac catheterization performed with Dr. Burt Knack 01/28/08 revealing three-vessel disease with preserved LV function. He underwent coronary artery bypass grafting by Dr. Servando Snare with a LIMA to his LAD, vein sequentially to an intermediate branch, obtuse marginal branch as well as the distal right coronary artery. A Myoview stress test performed 01/08/17 revealed inferolateral scar with mild peri-infarct ischemia. He's had no recurrent symptoms.

## 2017-08-03 NOTE — Telephone Encounter (Signed)
Fax confirmed 08/03/17 @1538 

## 2017-08-03 NOTE — Assessment & Plan Note (Signed)
History of hyperlipidemia on statin therapy followed by his PCP. His left liver profiles one year ago which time he had an LDL of 101. We will recheck a lipid and liver profile.

## 2017-08-03 NOTE — Patient Instructions (Signed)
Medication Instructions: Your physician recommends that you continue on your current medications as directed. Please refer to the Current Medication list given to you today.  Labwork: Your physician recommends that you return for a FASTING lipid profile and hepatic function panel.   Testing/Procedures: Your physician has requested that you have a carotid duplex in March 2019. This test is an ultrasound of the carotid arteries in your neck. It looks at blood flow through these arteries that supply the brain with blood. Allow one hour for this exam. There are no restrictions or special instructions.  Follow-Up: Your physician wants you to follow-up in: 6 months with Dr. Gwenlyn Found. You will receive a reminder letter in the mail two months in advance. If you don't receive a letter, please call our office to schedule the follow-up appointment.  If you need a refill on your cardiac medications before your next appointment, please call your pharmacy.

## 2017-08-03 NOTE — Telephone Encounter (Signed)
rx was faxed again on 08/03/17.

## 2017-08-09 DIAGNOSIS — E785 Hyperlipidemia, unspecified: Secondary | ICD-10-CM | POA: Diagnosis not present

## 2017-08-10 LAB — HEPATIC FUNCTION PANEL
ALT: 24 IU/L (ref 0–44)
AST: 21 IU/L (ref 0–40)
Albumin: 3.8 g/dL (ref 3.6–4.8)
Alkaline Phosphatase: 88 IU/L (ref 39–117)
BILIRUBIN TOTAL: 0.3 mg/dL (ref 0.0–1.2)
BILIRUBIN, DIRECT: 0.13 mg/dL (ref 0.00–0.40)
Total Protein: 7.2 g/dL (ref 6.0–8.5)

## 2017-08-10 LAB — LIPID PANEL
CHOLESTEROL TOTAL: 184 mg/dL (ref 100–199)
Chol/HDL Ratio: 3.1 ratio (ref 0.0–5.0)
HDL: 60 mg/dL (ref >39)
LDL Calculated: 105 mg/dL — ABNORMAL HIGH (ref 0–99)
TRIGLYCERIDES: 97 mg/dL (ref 0–149)
VLDL Cholesterol Cal: 19 mg/dL (ref 5–40)

## 2017-08-27 ENCOUNTER — Ambulatory Visit (INDEPENDENT_AMBULATORY_CARE_PROVIDER_SITE_OTHER): Payer: BLUE CROSS/BLUE SHIELD | Admitting: *Deleted

## 2017-08-27 DIAGNOSIS — Z23 Encounter for immunization: Secondary | ICD-10-CM

## 2017-08-30 ENCOUNTER — Other Ambulatory Visit: Payer: Self-pay | Admitting: Cardiovascular Disease

## 2017-08-30 DIAGNOSIS — E785 Hyperlipidemia, unspecified: Secondary | ICD-10-CM

## 2017-09-11 ENCOUNTER — Other Ambulatory Visit: Payer: Self-pay | Admitting: Cardiovascular Disease

## 2017-09-11 MED ORDER — ATORVASTATIN CALCIUM 80 MG PO TABS: 80.0000 mg | ORAL_TABLET | Freq: Every morning | ORAL | 3 refills | Status: DC

## 2017-09-11 NOTE — Telephone Encounter (Signed)
°*  STAT* If patient is at the pharmacy, call can be transferred to refill team.   1. Which medications need to be refilled? (please list name of each medication and dose if known) new prescription for Atorvastatin 80 mg  2. Which pharmacy/location (including street and city if local pharmacy) is medication to be sent to?[facility] [phone]  3. Do they need a 30 day or 90 day supply? 90 and refills

## 2017-09-11 NOTE — Telephone Encounter (Signed)
Refill sent to the pharmacy electronically.  

## 2017-09-13 ENCOUNTER — Other Ambulatory Visit: Payer: Self-pay | Admitting: Internal Medicine

## 2017-12-06 ENCOUNTER — Telehealth: Payer: Self-pay | Admitting: Internal Medicine

## 2017-12-06 NOTE — Telephone Encounter (Signed)
Copied from [place]. Topic: Quick Communication - Rx Refill/Question >> Dec 06, 2017  1:11 PM Aurelio Brash B wrote: Medication: traMADol (ULTRAM) 50 MG tablet   Has the patient contacted their pharmacy? yes   (Agent: If no, request that the patient contact the pharmacy for the refill.)   Preferred Pharmacy (with phone number or street name): [place], [place] [phone] (Phone) [phone] (Fax)     Agent: Please be advised that RX refills may take up to 3 business days. We ask that you follow-up with your pharmacy.

## 2017-12-06 NOTE — Telephone Encounter (Signed)
traMADol (ULTRAM) 50 MG tablet refill Last OV: 08/03/17 Last Refill:08/03/17 Pharmacy:[facility]

## 2017-12-07 NOTE — Telephone Encounter (Signed)
Ok to refill medication     Please advise

## 2017-12-11 NOTE — Telephone Encounter (Signed)
Izora Gala let's give him a month's supply until Dr. Raliegh Ip gets back.......... tramadol 50 mg. Dispense 60....... directions 1 tab twice a day when necessary for pain no refills

## 2017-12-12 ENCOUNTER — Other Ambulatory Visit: Payer: Self-pay

## 2017-12-12 MED ORDER — TRAMADOL HCL 50 MG PO TABS: ORAL_TABLET | ORAL | 0 refills | Status: DC

## 2017-12-12 NOTE — Telephone Encounter (Signed)
Rx has been printed waiting for Dr Sherren Mocha to sign and then fax it to [facility] pharmacy

## 2017-12-12 NOTE — Telephone Encounter (Signed)
Rx has been faxed to [facility]

## 2018-01-07 ENCOUNTER — Other Ambulatory Visit: Payer: Self-pay | Admitting: *Deleted

## 2018-01-07 NOTE — Telephone Encounter (Signed)
Fax request received for tramadol.

## 2018-01-10 NOTE — Telephone Encounter (Signed)
Pt called in stating he received a VM to call. Advised pt appt needed. Pt declined to schedule at this time.

## 2018-01-28 ENCOUNTER — Encounter: Payer: Self-pay | Admitting: Internal Medicine

## 2018-01-28 ENCOUNTER — Ambulatory Visit (INDEPENDENT_AMBULATORY_CARE_PROVIDER_SITE_OTHER): Payer: BLUE CROSS/BLUE SHIELD | Admitting: Internal Medicine

## 2018-01-28 VITALS — BP 112/68 | HR 65 | Temp 98.2°F | Wt 277.0 lb

## 2018-01-28 DIAGNOSIS — I2510 Atherosclerotic heart disease of native coronary artery without angina pectoris: Secondary | ICD-10-CM

## 2018-01-28 DIAGNOSIS — I10 Essential (primary) hypertension: Secondary | ICD-10-CM | POA: Diagnosis not present

## 2018-01-28 DIAGNOSIS — E785 Hyperlipidemia, unspecified: Secondary | ICD-10-CM | POA: Diagnosis not present

## 2018-01-28 DIAGNOSIS — E1151 Type 2 diabetes mellitus with diabetic peripheral angiopathy without gangrene: Secondary | ICD-10-CM | POA: Diagnosis not present

## 2018-01-28 MED ORDER — METFORMIN HCL 1000 MG PO TABS: 1000.0000 mg | ORAL_TABLET | Freq: Two times a day (BID) | ORAL | 3 refills | Status: DC

## 2018-01-28 MED ORDER — TRAMADOL HCL 50 MG PO TABS: ORAL_TABLET | ORAL | 0 refills | Status: DC

## 2018-01-28 NOTE — Patient Instructions (Signed)
Limit your sodium (Salt) intake   Please check your hemoglobin A1c every 3-6  Months  You need to lose weight.  Consider a lower calorie diet and regular exercise.    It is important that you exercise regularly, at least 20 minutes 3 to 4 times per week.  If you develop chest pain or shortness of breath seek  medical attention.

## 2018-01-28 NOTE — Progress Notes (Signed)
Subjective:    Patient ID: [name], male    DOB: April 30, 1946, 72 y.o.   MRN: [id number]  HPI  72 year old patient who is seen today for follow-up.  He has not been seen in over one year.  He is followed by cardiology with coronary artery disease.  He has essential hypertension and dyslipidemia.  Remains on statin therapy.  No cardiopulmonary complaints He has a history of exogenous obesity and his weight is down 10 pounds since his last visit here. No major concerns or complaints.  He does have occasional back and knee pain and is requesting a refill on tramadol.  Past Medical History:  Diagnosis Date  . CAD, ARTERY BYPASS GRAFT 02/10/2008  . CAROTID ARTERY STENOSIS, WITHOUT INFARCTION 09/22/2008  . CORONARY ARTERY DISEASE 04/03/2008  . Diabetes mellitus    "borderline"  . ERECTILE DYSFUNCTION 01/14/2009  . GOUT 01/16/2008  . HERPES ZOSTER 04/15/2010  . HYPERLIPIDEMIA 01/16/2008  . HYPERTENSION 01/16/2008  . IMPAIRED GLUCOSE TOLERANCE 08/22/2010  . Shingles 2011   left chest  . Ulcer 30 yrs ago   stomach     Social History   Socioeconomic History  . Marital status: Married    Spouse name: Not on file  . Number of children: 3  . Years of education: Not on file  . Highest education level: Not on file  Occupational History    Employer: [employer]  Social Needs  . Financial resource strain: Not on file  . Food insecurity:    Worry: Not on file    Inability: Not on file  . Transportation needs:    Medical: Not on file    Non-medical: Not on file  Tobacco Use  . Smoking status: Former Smoker    Packs/day: 1.00    Years: 10.00    Pack years: 10.00    Types: Cigarettes    Last attempt to quit: 07/27/1999    Years since quitting: 18.5  . Smokeless tobacco: Never Used  Substance and Sexual Activity  . Alcohol use: Yes    Alcohol/week: 0.0 oz    Comment: 4 beers a day  . Drug use: No  . Sexual activity: Not on file  Lifestyle  . Physical activity:    Days per  week: Not on file    Minutes per session: Not on file  . Stress: Not on file  Relationships  . Social connections:    Talks on phone: Not on file    Gets together: Not on file    Attends religious service: Not on file    Active member of club or organization: Not on file    Attends meetings of clubs or organizations: Not on file    Relationship status: Not on file  . Intimate partner violence:    Fear of current or ex partner: Not on file    Emotionally abused: Not on file    Physically abused: Not on file    Forced sexual activity: Not on file  Other Topics Concern  . Not on file  Social History Narrative   epworth sleepiness scale score: 4    Past Surgical History:  Procedure Laterality Date  . COLONOSCOPY     3 months ago  . CORONARY ARTERY BYPASS GRAFT  2009   vessels x3  . EAR CYST EXCISION N/A 07/29/2013   Procedure: excision of urachal cyst flexible cystoscopy insertion of foley cath;  Surgeon: Fredricka Bonine, MD;  Location: [facility];  Service:  Urology;  Laterality: N/A;  . LAPAROSCOPY N/A 07/29/2013   Procedure: LAPAROSCOPY DIAGNOSTIC  laparoscopic excision of urachal cyst ;  Surgeon: Madilyn Hook, DO;  Location: [facility];  Service: General;  Laterality: N/A;  . LAPAROTOMY  08/25/2012   Procedure: EXPLORATORY LAPAROTOMY;  Surgeon: Madilyn Hook, DO;  Location: [facility];  Service: General;  Laterality: N/A;  incision and drainage abdominal wall abcessand intra abdominal wall abcess  . PARTIAL GASTRECTOMY  1974   bleeding ulcers  . urachal  2014   Urachal cyst removal    Family History  Problem Relation Age of Onset  . Heart disease Mother   . Heart disease Sister   . Colon cancer Neg Hx     No Known Allergies  Current Outpatient Medications on File Prior to Visit  Medication Sig Dispense Refill  . allopurinol (ZYLOPRIM) 100 MG tablet TAKE 1 TABLET BY MOUTH  DAILY 90 tablet 2  . amLODipine (NORVASC) 10 MG tablet TAKE 1 TABLET BY MOUTH  DAILY 90 tablet 1  .  aspirin EC 325 MG tablet Take 325 mg by mouth every morning.     Marland Kitchen atorvastatin (LIPITOR) 80 MG tablet Take 1 tablet (80 mg total) every morning by mouth. 90 tablet 3  . Cholecalciferol (VITAMIN D3) 1000 UNITS CAPS Take 1 capsule by mouth daily.    Marland Kitchen glipiZIDE (GLUCOTROL XL) 2.5 MG 24 hr tablet Take 1 tablet (2.5 mg total) by mouth every evening. 30 tablet 5  . glucose blood (ONE TOUCH ULTRA TEST) test strip 1 each by Other route daily. Use as instructed 100 each 12  . ibuprofen (ADVIL,MOTRIN) 200 MG tablet Take 400 mg by mouth every 6 (six) hours as needed. For pain    . Lancets (ONETOUCH ULTRASOFT) lancets 1 each by Other route daily. Use as instructed 100 each 12  . lisinopril (PRINIVIL,ZESTRIL) 10 MG tablet Take 1 tablet (10 mg total) by mouth daily. 90 tablet 3  . metoprolol tartrate (LOPRESSOR) 50 MG tablet TAKE 1 TABLET BY MOUTH TWO  TIMES DAILY 180 tablet 1  . nitroGLYCERIN (NITROSTAT) 0.4 MG SL tablet Place 1 tablet (0.4 mg total) under the tongue every 5 (five) minutes as needed for chest pain. 15 tablet 5  . Omega 3-6-9 Fatty Acids (OMEGA-3 & OMEGA-6 FISH OIL PO) Take 1,000 mg by mouth daily.     . tamsulosin (FLOMAX) 0.4 MG CAPS capsule TAKE 1 CAPSULE BY MOUTH  DAILY 90 capsule 3   No current facility-administered medications on file prior to visit.     BP 112/68 (BP Location: Left Arm, Patient Position: Sitting, Cuff Size: Large)   Pulse 65   Temp 98.2 F (36.8 C) (Oral)   Wt 277 lb (125.6 kg)   SpO2 94%   BMI 39.18 kg/m     Review of Systems  Constitutional: Negative for appetite change, chills, fatigue and fever.  HENT: Negative for congestion, dental problem, ear pain, hearing loss, sore throat, tinnitus, trouble swallowing and voice change.   Eyes: Negative for pain, discharge and visual disturbance.  Respiratory: Negative for cough, chest tightness, wheezing and stridor.   Cardiovascular: Negative for chest pain, palpitations and leg swelling.  Gastrointestinal:  Negative for abdominal distention, abdominal pain, blood in stool, constipation, diarrhea, nausea and vomiting.  Genitourinary: Negative for difficulty urinating, discharge, flank pain, genital sores, hematuria and urgency.  Musculoskeletal: Negative for arthralgias, back pain, gait problem, joint swelling, myalgias and neck stiffness.  Skin: Negative for rash.  Neurological: Negative for dizziness,  syncope, speech difficulty, weakness, numbness and headaches.  Hematological: Negative for adenopathy. Does not bruise/bleed easily.  Psychiatric/Behavioral: Negative for behavioral problems and dysphoric mood. The patient is not nervous/anxious.        Objective:   Physical Exam  Constitutional: He is oriented to person, place, and time. He appears well-developed. No distress.  Blood pressure well controlled Weight 277  HENT:  Head: Normocephalic.  Right Ear: External ear normal.  Left Ear: External ear normal.  Eyes: Conjunctivae and EOM are normal.  Neck: Normal range of motion.  Cardiovascular: Normal rate and normal heart sounds.  Pulmonary/Chest: Breath sounds normal.  Abdominal: Bowel sounds are normal.  Musculoskeletal: Normal range of motion. He exhibits no edema or tenderness.  Neurological: He is alert and oriented to person, place, and time.  Psychiatric: He has a normal mood and affect. His behavior is normal.          Assessment & Plan:   Diabetes mellitus.  Hemoglobin A1c today 6.6 Essential hypertension well-controlled Coronary artery disease stable Dyslipidemia continue high intensity statin  CPX 3 to 4 months     Nyoka Cowden

## 2018-02-27 ENCOUNTER — Telehealth: Payer: Self-pay | Admitting: Family Medicine

## 2018-02-28 NOTE — Telephone Encounter (Signed)
Dr K pt 

## 2018-03-05 NOTE — Telephone Encounter (Signed)
Relation to pt: spouse  Call back number:[phone] Pharmacy: [place], [place] [phone] (Fax)       Reason for call:  Spouse checking on the status of traMADol (ULTRAM) 50 MG tablet request, spouse requesting a phone call, please advise

## 2018-03-08 NOTE — Telephone Encounter (Signed)
Called [facility]. They did received prescription and are processing it.  Pt's wife notified and verbalized understanding.

## 2018-03-08 NOTE — Telephone Encounter (Signed)
Patient's wife Arbie Cookey calling because [facility] is saying they never received an approval on Tramadol. The script was sent on 04/30. Please call her once it has been resent.

## 2018-03-27 ENCOUNTER — Other Ambulatory Visit: Payer: Self-pay | Admitting: Internal Medicine

## 2018-04-12 ENCOUNTER — Other Ambulatory Visit: Payer: Self-pay

## 2018-04-12 MED ORDER — TRAMADOL HCL 50 MG PO TABS: ORAL_TABLET | ORAL | 0 refills | Status: DC

## 2018-04-18 ENCOUNTER — Telehealth: Payer: Self-pay | Admitting: Internal Medicine

## 2018-04-18 NOTE — Telephone Encounter (Signed)
Copied from [place] [phone]. Topic: Quick Communication - Rx Refill/Question >> Apr 18, 2018  2:22 PM Oliver Pila B wrote: Medication: traMADol (ULTRAM) 50 MG tablet [[id number]]   Has the patient contacted their pharmacy? Yes.   (Agent: If no, request that the patient contact the pharmacy for the refill.) (Agent: If yes, when and what did the pharmacy advise?)  Preferred Pharmacy (with phone number or street name): [facility]  Agent: Please be advised that RX refills may take up to 3 business days. We ask that you follow-up with your pharmacy.

## 2018-04-18 NOTE — Telephone Encounter (Signed)
Refill request Tramadol 50 mg  LOV 01/28/2018 DR Burnice Logan  Last filled  04/12/2018  60 tabs 1 bid Dr Inda Merlin   Pharmacy [facility]

## 2018-04-19 NOTE — Telephone Encounter (Signed)
Rx was sent in 04/12/2018. [facility] verified that pt has not picked up the Rx but it is ready for pick up. Pt was updated and verbalized understanding.

## 2018-04-25 ENCOUNTER — Telehealth: Payer: Self-pay | Admitting: Internal Medicine

## 2018-04-25 NOTE — Telephone Encounter (Signed)
[facility] calling to request refill of Tramadol which was recently [facility] to [facility] on [address] on 04/12/18. Attempted to call pt on home and cell but no answer at this time. Message left on pt's home number to return call to the office to confirm that the pt wanted to use [facility] and not [facility] for refill.

## 2018-04-25 NOTE — Telephone Encounter (Unsigned)
Copied from [place] [phone]. Topic: General - Other >> Apr 25, 2018  5:51 PM Mcneil, Ja-Kwan wrote: Reason for CRM: Pt states he received Rx for traMADol (ULTRAM) 50 MG tablet on Monday 04/22/18 from [facility]. Pt states he will not need another refill for a month.

## 2018-04-25 NOTE — Telephone Encounter (Signed)
Copied from [place] [phone]. Topic: Quick Communication - Rx Refill/Question >> Apr 25, 2018  9:41 AM Alfredia Ferguson R wrote: Medication:traMADol (ULTRAM) 50 MG tablet  Has the patient contacted their pharmacy? Yes (Agent: If no, request that the patient contact the pharmacy for the refill.) (Agent: If yes, when and what did the pharmacy advise?)  Preferred Pharmacy (with phone number or street name): [place], [facility] [place] [address] [place] [address] [place] [address] Phone: [phone] Fax: [phone]    Agent: Please be advised that RX refills may take up to 3 business days. We ask that you follow-up with your pharmacy.

## 2018-04-26 NOTE — Telephone Encounter (Signed)
Noted  

## 2018-04-26 NOTE — Telephone Encounter (Signed)
Pt already picked up Rx from pharmacy. No Further action needed.

## 2018-04-26 NOTE — Telephone Encounter (Signed)
LM for call back - see other open TE for 04/25/18 regarding Tramadol Rx

## 2018-05-10 ENCOUNTER — Other Ambulatory Visit: Payer: Self-pay | Admitting: Internal Medicine

## 2018-05-15 ENCOUNTER — Other Ambulatory Visit: Payer: Self-pay | Admitting: Internal Medicine

## 2018-05-22 ENCOUNTER — Other Ambulatory Visit: Payer: Self-pay

## 2018-05-22 MED ORDER — METFORMIN HCL 1000 MG PO TABS: 1000.0000 mg | ORAL_TABLET | Freq: Two times a day (BID) | ORAL | 3 refills | Status: DC

## 2018-06-06 ENCOUNTER — Encounter: Payer: Self-pay | Admitting: Internal Medicine

## 2018-06-06 ENCOUNTER — Ambulatory Visit (INDEPENDENT_AMBULATORY_CARE_PROVIDER_SITE_OTHER): Payer: BLUE CROSS/BLUE SHIELD | Admitting: Internal Medicine

## 2018-06-06 VITALS — BP 124/60 | HR 64 | Temp 98.0°F | Wt 273.2 lb

## 2018-06-06 DIAGNOSIS — I10 Essential (primary) hypertension: Secondary | ICD-10-CM | POA: Diagnosis not present

## 2018-06-06 DIAGNOSIS — E1151 Type 2 diabetes mellitus with diabetic peripheral angiopathy without gangrene: Secondary | ICD-10-CM

## 2018-06-06 DIAGNOSIS — M25552 Pain in left hip: Secondary | ICD-10-CM | POA: Insufficient documentation

## 2018-06-06 DIAGNOSIS — I2510 Atherosclerotic heart disease of native coronary artery without angina pectoris: Secondary | ICD-10-CM

## 2018-06-06 LAB — POCT GLYCOSYLATED HEMOGLOBIN (HGB A1C): HEMOGLOBIN A1C: 6.1 % — AB (ref 4.0–5.6)

## 2018-06-06 MED ORDER — MELOXICAM 7.5 MG PO TABS: 7.5000 mg | ORAL_TABLET | Freq: Every day | ORAL | 1 refills | Status: DC

## 2018-06-06 NOTE — Patient Instructions (Signed)
Hip Bursitis Hip bursitis is swelling of a fluid-filled sac (bursa) in your hip. This swelling (inflammation) can be painful. This condition may come and go over time. Follow these instructions at home: Medicines  Take over-the-counter and prescription medicines only as told by your doctor.  Do not drive or use heavy machinery while taking prescription pain medicine, or as told by your doctor.  If you were prescribed an antibiotic medicine, take it as told by your doctor. Do not stop taking the antibiotic even if you start to feel better. Activity  Return to your normal activities as told by your doctor. Ask your doctor what activities are safe for you.  Rest and protect your hip until you feel better. General instructions  Wear wraps that put pressure on your hip (compression wraps) only as told by your doctor.  Raise (elevate) your hip above the level of your heart as much as you can. To do this, try putting a pillow under your hips while you lie down. Stop if this causes pain.  Do not use your hip to support your body weight until your doctor says that you can.  Use crutches as told by your doctor.  Gently rub and stretch your injured area as often as is comfortable.  Keep all follow-up visits as told by your doctor. This is important. How is this prevented?  Exercise regularly, as told by your doctor.  Warm up and stretch before being active.  Cool down and stretch after being active.  Avoid activities that bother your hip or cause pain.  Avoid sitting down for long periods at a time. Contact a doctor if:  You have a fever.  You get new symptoms.  You have trouble walking.  You have trouble doing everyday activities.  You have pain that gets worse.  You have pain that does not get better with medicine.  You get red skin on your hip area.  You get a feeling of warmth in your hip area. Get help right away if:  You cannot move your hip.  You have very bad  pain. This information is not intended to replace advice given to you by your health care provider. Make sure you discuss any questions you have with your health care provider. Document Released: 11/25/2010 Document Revised: 03/30/2016 Document Reviewed: 05/25/2015 Elsevier Interactive Patient Education  2018 [employer].  Trochanteric Bursitis Rehab Ask your health care provider which exercises are safe for you. Do exercises exactly as told by your health care provider and adjust them as directed. It is normal to feel mild stretching, pulling, tightness, or discomfort as you do these exercises, but you should stop right away if you feel sudden pain or your pain gets worse.Do not begin these exercises until told by your health care provider. Stretching exercises These exercises warm up your muscles and joints and improve the movement and flexibility of your hip. These exercises also help to relieve pain and stiffness. Exercise A: Iliotibial band stretch  1. Lie on your side with your left / right leg in the top position. 2. Bend your left / right knee and grab your ankle. 3. Slowly bring your knee back so your thigh is behind your body. 4. Slowly lower your knee toward the floor until you feel a gentle stretch on the outside of your left / right thigh. If you do not feel a stretch and your knee will not fall farther, place the heel of your other foot on top of your  outer knee and pull your thigh down farther. 5. Hold this position for __________ seconds. 6. Slowly return to the starting position. Repeat __________ times. Complete this exercise __________ times a day. Strengthening exercises These exercises build strength and endurance in your hip and pelvis. Endurance is the ability to use your muscles for a long time, even after they get tired. Exercise B: Bridge ( hip extensors) 1. Lie on your back on a firm surface with your knees bent and your feet flat on the floor. 2. Tighten your  buttocks muscles and lift your buttocks off the floor until your trunk is level with your thighs. You should feel the muscles working in your buttocks and the back of your thighs. If this exercise is too easy, try doing it with your arms crossed over your chest. 3. Hold this position for __________ seconds. 4. Slowly return to the starting position. 5. Let your muscles relax completely between repetitions. Repeat __________ times. Complete this exercise __________ times a day. Exercise C: Squats ( knee extensors and  quadriceps) 1. Stand in front of a table, with your feet and knees pointing straight ahead. You may rest your hands on the table for balance but not for support. 2. Slowly bend your knees and lower your hips like you are going to sit in a chair. ? Keep your weight over your heels, not over your toes. ? Keep your lower legs upright so they are parallel with the table legs. ? Do not let your hips go lower than your knees. ? Do not bend lower than told by your health care provider. ? If your hip pain increases, do not bend as low. 3. Hold this position for __________ seconds. 4. Slowly push with your legs to return to standing. Do not use your hands to pull yourself to standing. Repeat __________ times. Complete this exercise __________ times a day. Exercise D: Hip hike 1. Stand sideways on a bottom step. Stand on your left / right leg with your other foot unsupported next to the step. You can hold onto the railing or wall if needed for balance. 2. Keeping your knees straight and your torso square, lift your left / right hip up toward the ceiling. 3. Hold this position for __________ seconds. 4. Slowly let your left / right hip lower toward the floor, past the starting position. Your foot should get closer to the floor. Do not lean or bend your knees. Repeat __________ times. Complete this exercise __________ times a day. Exercise E: Single leg stand 1. Stand near a counter or door  frame that you can hold onto for balance as needed. It is helpful to stand in front of a mirror for this exercise so you can watch your hip. 2. Squeeze your left / right buttock muscles then lift up your other foot. Do not let your left / right hip push out to the side. 3. Hold this position for __________ seconds. Repeat __________ times. Complete this exercise __________ times a day. This information is not intended to replace advice given to you by your health care provider. Make sure you discuss any questions you have with your health care provider. Document Released: 11/30/2004 Document Revised: 06/29/2016 Document Reviewed: 10/08/2015 Elsevier Interactive Patient Education  [employer].

## 2018-06-06 NOTE — Progress Notes (Signed)
Subjective:    Patient ID: [name], male    DOB: 14-Apr-1946, 72 y.o.   MRN: [id number]  HPI Lab Results  Component Value Date   HGBA1C 7.0 (H) 12/01/2016    Lab Results  Component Value Date   HGBA1C 6.1 (A) 06/06/2018    72 year old patient who presents with a 3-week history of left hip discomfort.  Pain is aggravated by movement such as walking and turning in bed.  No history of falls or trauma. He does have a history of type 2 diabetes which has been managed with glipizide And metformin therapy.  He has coronary artery disease and is status post CABG.  Remains on aspirin and statin therapy.  No cardiopulmonary complaints.  Past Medical History:  Diagnosis Date  . CAD, ARTERY BYPASS GRAFT 02/10/2008  . CAROTID ARTERY STENOSIS, WITHOUT INFARCTION 09/22/2008  . CORONARY ARTERY DISEASE 04/03/2008  . Diabetes mellitus    "borderline"  . ERECTILE DYSFUNCTION 01/14/2009  . GOUT 01/16/2008  . HERPES ZOSTER 04/15/2010  . HYPERLIPIDEMIA 01/16/2008  . HYPERTENSION 01/16/2008  . IMPAIRED GLUCOSE TOLERANCE 08/22/2010  . Shingles 2011   left chest  . Ulcer 30 yrs ago   stomach     Social History   Socioeconomic History  . Marital status: Married    Spouse name: Not on file  . Number of children: 3  . Years of education: Not on file  . Highest education level: Not on file  Occupational History    Employer: [employer]  Social Needs  . Financial resource strain: Not on file  . Food insecurity:    Worry: Not on file    Inability: Not on file  . Transportation needs:    Medical: Not on file    Non-medical: Not on file  Tobacco Use  . Smoking status: Former Smoker    Packs/day: 1.00    Years: 10.00    Pack years: 10.00    Types: Cigarettes    Last attempt to quit: 07/27/1999    Years since quitting: 18.8  . Smokeless tobacco: Never Used  Substance and Sexual Activity  . Alcohol use: Yes    Comment: 4 beers a day  . Drug use: No  . Sexual activity: Not on  file  Lifestyle  . Physical activity:    Days per week: Not on file    Minutes per session: Not on file  . Stress: Not on file  Relationships  . Social connections:    Talks on phone: Not on file    Gets together: Not on file    Attends religious service: Not on file    Active member of club or organization: Not on file    Attends meetings of clubs or organizations: Not on file    Relationship status: Not on file  . Intimate partner violence:    Fear of current or ex partner: Not on file    Emotionally abused: Not on file    Physically abused: Not on file    Forced sexual activity: Not on file  Other Topics Concern  . Not on file  Social History Narrative   epworth sleepiness scale score: 4    Past Surgical History:  Procedure Laterality Date  . COLONOSCOPY     3 months ago  . CORONARY ARTERY BYPASS GRAFT  2009   vessels x3  . EAR CYST EXCISION N/A 07/29/2013   Procedure: excision of urachal cyst flexible cystoscopy insertion of foley cath;  Surgeon: Fredricka Bonine, MD;  Location: [facility];  Service: Urology;  Laterality: N/A;  . LAPAROSCOPY N/A 07/29/2013   Procedure: LAPAROSCOPY DIAGNOSTIC  laparoscopic excision of urachal cyst ;  Surgeon: Madilyn Hook, DO;  Location: [facility];  Service: General;  Laterality: N/A;  . LAPAROTOMY  08/25/2012   Procedure: EXPLORATORY LAPAROTOMY;  Surgeon: Madilyn Hook, DO;  Location: [facility];  Service: General;  Laterality: N/A;  incision and drainage abdominal wall abcessand intra abdominal wall abcess  . PARTIAL GASTRECTOMY  1974   bleeding ulcers  . urachal  2014   Urachal cyst removal    Family History  Problem Relation Age of Onset  . Heart disease Mother   . Heart disease Sister   . Colon cancer Neg Hx     No Known Allergies  Current Outpatient Medications on File Prior to Visit  Medication Sig Dispense Refill  . allopurinol (ZYLOPRIM) 100 MG tablet TAKE 1 TABLET BY MOUTH  DAILY 90 tablet 2  . amLODipine (NORVASC) 10 MG  tablet TAKE 1 TABLET BY MOUTH  DAILY 90 tablet 1  . aspirin EC 325 MG tablet Take 325 mg by mouth every morning.     Marland Kitchen atorvastatin (LIPITOR) 80 MG tablet Take 1 tablet (80 mg total) every morning by mouth. 90 tablet 3  . Cholecalciferol (VITAMIN D3) 1000 UNITS CAPS Take 1 capsule by mouth daily.    Marland Kitchen glipiZIDE (GLUCOTROL XL) 2.5 MG 24 hr tablet Take 1 tablet (2.5 mg total) by mouth every evening. 30 tablet 5  . glucose blood (ONE TOUCH ULTRA TEST) test strip 1 each by Other route daily. Use as instructed 100 each 12  . ibuprofen (ADVIL,MOTRIN) 200 MG tablet Take 400 mg by mouth every 6 (six) hours as needed. For pain    . Lancets (ONETOUCH ULTRASOFT) lancets 1 each by Other route daily. Use as instructed 100 each 12  . lisinopril (PRINIVIL,ZESTRIL) 10 MG tablet Take 1 tablet (10 mg total) by mouth daily. 90 tablet 3  . metFORMIN (GLUCOPHAGE) 1000 MG tablet Take 1 tablet (1,000 mg total) by mouth 2 (two) times daily with a meal. 180 tablet 3  . metoprolol tartrate (LOPRESSOR) 50 MG tablet TAKE 1 TABLET BY MOUTH TWO  TIMES DAILY 180 tablet 1  . nitroGLYCERIN (NITROSTAT) 0.4 MG SL tablet Place 1 tablet (0.4 mg total) under the tongue every 5 (five) minutes as needed for chest pain. 15 tablet 5  . Omega 3-6-9 Fatty Acids (OMEGA-3 & OMEGA-6 FISH OIL PO) Take 1,000 mg by mouth daily.     . tamsulosin (FLOMAX) 0.4 MG CAPS capsule TAKE 1 CAPSULE BY MOUTH  DAILY 90 capsule 3  . traMADol (ULTRAM) 50 MG tablet TAKE 1 TABLET BY MOUTH TWO  TIMES DAILY AS NEEDED FOR  PAIN 60 tablet 0   No current facility-administered medications on file prior to visit.     BP 124/60 (BP Location: Right Arm, Patient Position: Sitting, Cuff Size: Large)   Pulse 64   Temp 98 F (36.7 C) (Oral)   Wt 273 lb 3.2 oz (123.9 kg)   SpO2 96%   BMI 38.65 kg/m     Review of Systems  Constitutional: Negative for appetite change, chills, fatigue and fever.  HENT: Negative for congestion, dental problem, ear pain, hearing loss,  sore throat, tinnitus, trouble swallowing and voice change.   Eyes: Negative for pain, discharge and visual disturbance.  Respiratory: Negative for cough, chest tightness, wheezing and stridor.   Cardiovascular: Negative  for chest pain, palpitations and leg swelling.  Gastrointestinal: Negative for abdominal distention, abdominal pain, blood in stool, constipation, diarrhea, nausea and vomiting.  Genitourinary: Negative for difficulty urinating, discharge, flank pain, genital sores, hematuria and urgency.  Musculoskeletal: Negative for arthralgias, back pain, gait problem, joint swelling, myalgias and neck stiffness.       Left lateral hip pain  Skin: Negative for rash.  Neurological: Negative for dizziness, syncope, speech difficulty, weakness, numbness and headaches.  Hematological: Negative for adenopathy. Does not bruise/bleed easily.  Psychiatric/Behavioral: Negative for behavioral problems and dysphoric mood. The patient is not nervous/anxious.        Objective:   Physical Exam  Constitutional: He appears well-developed and well-nourished. No distress.  Musculoskeletal:  Mild tenderness over the left greater trochanter area with palpation Full range of motion of the left hip Mild discomfort over the lateral hip with internal rotation          Assessment & Plan:   Suspect left greater trochanteric bursitis.  Will treat with rest and short-term anti-inflammatory medication.  If unimproved patient given instructions for rehab exercises.  Consider PT referral if unimproved  Diabetes mellitus stable Essential hypertension stable Coronary artery disease.  Status post CABG  Marletta Lor

## 2018-06-07 ENCOUNTER — Encounter: Payer: Self-pay | Admitting: Internal Medicine

## 2018-06-14 ENCOUNTER — Other Ambulatory Visit: Payer: Self-pay | Admitting: Internal Medicine

## 2018-06-14 NOTE — Telephone Encounter (Signed)
Copied from [place] [id number]. Topic: Inquiry >> Jun 14, 2018  3:11 PM Pricilla Handler wrote: Reason for CRM: Patient's wife Arbie Cookey called requesting a refill of TraMADol (ULTRAM) 50 MG tablet. Patient's preferred pharmacy is [place], [place] [facility] [phone] (Phone) [phone] (Fax).       Thank You!!!

## 2018-06-14 NOTE — Telephone Encounter (Signed)
Tramadol refill Last Refill: 05/13/18;  # 60; no RF  Last OV:  06/06/18 PCP: Dr. Burnice Logan Pharmacy: [facility]

## 2018-06-17 MED ORDER — TRAMADOL HCL 50 MG PO TABS: ORAL_TABLET | ORAL | 0 refills | Status: DC

## 2018-07-04 ENCOUNTER — Other Ambulatory Visit: Payer: Self-pay

## 2018-07-04 MED ORDER — METOPROLOL TARTRATE 50 MG PO TABS: 50.0000 mg | ORAL_TABLET | Freq: Two times a day (BID) | ORAL | 1 refills | Status: DC

## 2018-07-16 ENCOUNTER — Telehealth: Payer: Self-pay | Admitting: Internal Medicine

## 2018-07-23 NOTE — Telephone Encounter (Signed)
Patient wife is calling for a update in regards to this medication please advise

## 2018-07-24 ENCOUNTER — Ambulatory Visit (HOSPITAL_COMMUNITY)
Admission: RE | Admit: 2018-07-24 | Discharge: 2018-07-24 | Disposition: A | Discharge status: Home / Self Care | Payer: BLUE CROSS/BLUE SHIELD | Source: Ambulatory Visit | Attending: Cardiovascular Disease | Admitting: Cardiovascular Disease

## 2018-07-24 DIAGNOSIS — I6529 Occlusion and stenosis of unspecified carotid artery: Secondary | ICD-10-CM | POA: Diagnosis not present

## 2018-07-25 NOTE — Telephone Encounter (Signed)
Left detailed message informing pt of update. 

## 2018-07-26 ENCOUNTER — Other Ambulatory Visit: Payer: Self-pay | Admitting: Internal Medicine

## 2018-07-30 ENCOUNTER — Other Ambulatory Visit: Payer: Self-pay | Admitting: *Deleted

## 2018-07-30 DIAGNOSIS — I6529 Occlusion and stenosis of unspecified carotid artery: Secondary | ICD-10-CM

## 2018-08-19 ENCOUNTER — Other Ambulatory Visit: Payer: Self-pay | Admitting: Internal Medicine

## 2018-08-20 ENCOUNTER — Other Ambulatory Visit: Payer: Self-pay

## 2018-08-20 MED ORDER — TRAMADOL HCL 50 MG PO TABS: ORAL_TABLET | ORAL | 0 refills | Status: DC

## 2018-08-20 NOTE — Telephone Encounter (Signed)
ok 

## 2018-08-20 NOTE — Telephone Encounter (Signed)
Pt aware to follow up with new PCP for further refills due to Dr.Kwiakowski's retiring via vm.   Okay for refill?

## 2018-09-25 ENCOUNTER — Other Ambulatory Visit: Payer: Self-pay | Admitting: Family Medicine

## 2018-10-10 ENCOUNTER — Encounter: Payer: Self-pay | Admitting: Family Medicine

## 2018-10-10 ENCOUNTER — Ambulatory Visit (INDEPENDENT_AMBULATORY_CARE_PROVIDER_SITE_OTHER): Payer: BLUE CROSS/BLUE SHIELD | Admitting: Family Medicine

## 2018-10-10 VITALS — BP 110/70 | HR 58 | Temp 97.9°F | Wt 280.0 lb

## 2018-10-10 DIAGNOSIS — Z23 Encounter for immunization: Secondary | ICD-10-CM | POA: Diagnosis not present

## 2018-10-10 DIAGNOSIS — I10 Essential (primary) hypertension: Secondary | ICD-10-CM | POA: Diagnosis not present

## 2018-10-10 DIAGNOSIS — M25561 Pain in right knee: Secondary | ICD-10-CM | POA: Diagnosis not present

## 2018-10-10 DIAGNOSIS — G8929 Other chronic pain: Secondary | ICD-10-CM | POA: Diagnosis not present

## 2018-10-10 DIAGNOSIS — M25562 Pain in left knee: Secondary | ICD-10-CM | POA: Diagnosis not present

## 2018-10-10 DIAGNOSIS — M109 Gout, unspecified: Secondary | ICD-10-CM

## 2018-10-10 DIAGNOSIS — E785 Hyperlipidemia, unspecified: Secondary | ICD-10-CM

## 2018-10-10 DIAGNOSIS — E1151 Type 2 diabetes mellitus with diabetic peripheral angiopathy without gangrene: Secondary | ICD-10-CM

## 2018-10-10 DIAGNOSIS — I6529 Occlusion and stenosis of unspecified carotid artery: Secondary | ICD-10-CM

## 2018-10-10 MED ORDER — ATORVASTATIN CALCIUM 80 MG PO TABS: 80.0000 mg | ORAL_TABLET | Freq: Every morning | ORAL | 3 refills | Status: DC

## 2018-10-10 MED ORDER — TRAMADOL HCL 50 MG PO TABS: ORAL_TABLET | ORAL | 0 refills | Status: DC

## 2018-10-10 NOTE — Patient Instructions (Signed)
How to Avoid Diabetes Mellitus Problems You can take action to prevent or slow down problems that are caused by diabetes (diabetes mellitus). Following your diabetes plan and taking care of yourself can reduce your risk of serious or life-threatening complications. Manage your diabetes  Follow instructions from your health care providers about managing your diabetes. Your diabetes may be managed by a team of health care providers who can teach you how to care for yourself and can answer questions that you have.  Educate yourself about your condition so you can make healthy choices about eating and physical activity.  Check your blood sugar (glucose) levels as often as directed. Your health care provider will help you decide how often to check your blood glucose level depending on your treatment goals and how well you are meeting them.  Ask your health care provider if you should take low-dose aspirin daily and what dose is recommended for you. Taking low-dose aspirin daily is recommended to help prevent cardiovascular disease. Do not use nicotine or tobacco Do not use any products that contain nicotine or tobacco, such as cigarettes and e-cigarettes. If you need help quitting, ask your health care provider. Nicotine raises your risk for diabetes problems. If you quit using nicotine:  You will lower your risk for heart attack, stroke, nerve disease, and kidney disease.  Your cholesterol and blood pressure may improve.  Your blood circulation will improve.  Keep your blood pressure under control To control your blood pressure:  Follow instructions from your health care provider about meal planning, exercise, and medicines.  Make sure your health care provider checks your blood pressure at every medical visit.  A blood pressure reading consists of two numbers. Generally, the goal is to keep your top number (systolic pressure) at or below 130, and your bottom number (diastolic pressure) at or  below 80. Your health care provider may recommend a lower target blood pressure. Your individualized target blood pressure is determined based on:  Your age.  Your medicines.  How long you have had diabetes.  Any other medical conditions you have.  Keep your cholesterol under control To control your cholesterol:  Follow instructions from your health care provider about meal planning, exercise, and medicines.  Have your cholesterol checked at least once a year.  You may be prescribed medicine to lower cholesterol (statin). If you are not taking a statin, ask your health care provider if you should be.  Controlling your cholesterol may:  Help prevent heart disease and stroke. These are the most common health problems for people with diabetes.  Improve your blood flow.  Schedule and keep yearly physical exams and eye exams Your health care provider will tell you how often you need medical visits depending on your diabetes management plan. Keep all follow-up visits as directed. This is important so possible problems can be identified early and complications can be avoided or treated.  Every visit with your health care provider should include measuring your: ? Weight. ? Blood pressure. ? Blood glucose control.  Your A1c (hemoglobin A1c) level should be checked: ? At least 2 times a year, if you are meeting your treatment goals. ? 4 times a year, if you are not meeting treatment goals or if your treatment goals have changed.  Your blood lipids (lipid profile) should be checked yearly. You should also be checked yearly for protein in your urine (urine microalbumin).  If you have type 1 diabetes, get an eye exam 3-5 years after you  are diagnosed, and then once a year after your first exam.  If you have type 2 diabetes, get an eye exam as soon as you are diagnosed, and then once a year after your first exam.  Keep your vaccines current It is recommended that you receive:  A flu  (influenza) vaccine every year.  A pneumonia (pneumococcal) vaccine and a hepatitis B vaccine. If you are age 6 or older, you may get the pneumonia vaccine as a series of two separate shots.  Ask your health care provider which other vaccines may be recommended. Take care of your feet Diabetes may cause you to have poor blood circulation to your legs and feet. Because of this, taking care of your feet is very important. Diabetes can cause:  The skin on the feet to get thinner, break more easily, and heal more slowly.  Nerve damage in your legs and feet, which results in decreased feeling. You may not notice minor injuries that could lead to serious problems.  To avoid foot problems:  Check your skin and feet every day for cuts, bruises, redness, blisters, or sores.  Schedule a foot exam with your health care provider once every year. This exam includes: ? Inspecting of the structure and skin of your feet. ? Checking the pulses and sensation in your feet.  Make sure that your health care provider performs a visual foot exam at every medical visit.  Take care of your teeth People with poorly controlled diabetes are more likely to have gum (periodontal) disease. Diabetes can make periodontal diseases harder to control. If not treated, periodontal diseases can lead to tooth loss. To prevent this:  Brush your teeth twice a day.  Floss at least once a day.  Visit your dentist 2 times a year.  Drink responsibly Limit alcohol intake to no more than 1 drink a day for nonpregnant women and 2 drinks a day for men. One drink equals 12 oz of beer, 5 oz of wine, or 1 oz of hard liquor. It is important to eat food when you drink alcohol to avoid low blood glucose (hypoglycemia). Avoid alcohol if you:  Have a history of alcohol abuse or dependence.  Are pregnant.  Have liver disease, pancreatitis, advanced neuropathy, or severe hypertriglyceridemia.  Lessen stress Living with diabetes can  be stressful. When you are experiencing stress, your blood glucose may be affected in two ways:  Stress hormones may cause your blood glucose to rise.  You may be distracted from taking good care of yourself.  Be aware of your stress level and make changes to help you manage challenging situations. To lower your stress levels:  Consider joining a support group.  Do planned relaxation or meditation.  Do a hobby that you enjoy.  Maintain healthy relationships.  Exercise regularly.  Work with your health care provider or a mental health professional.  Summary  You can take action to prevent or slow down problems that are caused by diabetes (diabetes mellitus). Following your diabetes plan and taking care of yourself can reduce your risk of serious or life-threatening complications.  Follow instructions from your health care providers about managing your diabetes. Your diabetes may be managed by a team of health care providers who can teach you how to care for yourself and can answer questions that you have.  Your health care provider will tell you how often you need medical visits depending on your diabetes management plan. Keep all follow-up visits as directed. This is important  so possible problems can be identified early and complications can be avoided or treated. This information is not intended to replace advice given to you by your health care provider. Make sure you discuss any questions you have with your health care provider. Document Released: 07/11/2011 Document Revised: 07/22/2016 Document Reviewed: 07/22/2016 Elsevier Interactive Patient Education  2018 [employer].  Gout Gout is painful swelling that can occur in some of your joints. Gout is a type of arthritis. This condition is caused by having too much uric acid in your body. Uric acid is a chemical that forms when your body breaks down substances called purines. Purines are important for building body proteins. When  your body has too much uric acid, sharp crystals can form and build up inside your joints. This causes pain and swelling. Gout attacks can happen quickly and be very painful (acute gout). Over time, the attacks can affect more joints and become more frequent (chronic gout). Gout can also cause uric acid to build up under your skin and inside your kidneys. What are the causes? This condition is caused by too much uric acid in your blood. This can occur because:  Your kidneys do not remove enough uric acid from your blood. This is the most common cause.  Your body makes too much uric acid. This can occur with some cancers and cancer treatments. It can also occur if your body is breaking down too many red blood cells (hemolytic anemia).  You eat too many foods that are high in purines. These foods include organ meats and some seafood. Alcohol, especially beer, is also high in purines.  A gout attack may be triggered by trauma or stress. What increases the risk? This condition is more likely to develop in people who:  Have a family history of gout.  Are male and middle-aged.  Are male and have gone through menopause.  Are obese.  Frequently drink alcohol, especially beer.  Are dehydrated.  Lose weight too quickly.  Have an organ transplant.  Have lead poisoning.  Take certain medicines, including aspirin, cyclosporine, diuretics, levodopa, and niacin.  Have kidney disease or psoriasis.  What are the signs or symptoms? An attack of acute gout happens quickly. It usually occurs in just one joint. The most common place is the big toe. Attacks often start at night. Other joints that may be affected include joints of the feet, ankle, knee, fingers, wrist, or elbow. Symptoms may include:  Severe pain.  Warmth.  Swelling.  Stiffness.  Tenderness. The affected joint may be very painful to touch.  Shiny, red, or purple skin.  Chills and fever.  Chronic gout may cause  symptoms more frequently. More joints may be involved. You may also have white or yellow lumps (tophi) on your hands or feet or in other areas near your joints. How is this diagnosed? This condition is diagnosed based on your symptoms, medical history, and physical exam. You may have tests, such as:  Blood tests to measure uric acid levels.  Removal of joint fluid with a needle (aspiration) to look for uric acid crystals.  X-rays to look for joint damage.  How is this treated? Treatment for this condition has two phases: treating an acute attack and preventing future attacks. Acute gout treatment may include medicines to reduce pain and swelling, including:  NSAIDs.  Steroids. These are strong anti-inflammatory medicines that can be taken by mouth (orally) or injected into a joint.  Colchicine. This medicine relieves pain and swelling  when it is taken soon after an attack. It can be given orally or through an IV tube.  Preventive treatment may include:  Daily use of smaller doses of NSAIDs or colchicine.  Use of a medicine that reduces uric acid levels in your blood.  Changes to your diet. You may need to see a specialist about healthy eating (dietitian).  Follow these instructions at home: During a Gout Attack  If directed, apply ice to the affected area: ? Put ice in a plastic bag. ? Place a towel between your skin and the bag. ? Leave the ice on for 20 minutes, 2-3 times a day.  Rest the joint as much as possible. If the affected joint is in your leg, you may be given crutches to use.  Raise (elevate) the affected joint above the level of your heart as often as possible.  Drink enough fluids to keep your urine clear or pale yellow.  Take over-the-counter and prescription medicines only as told by your health care provider.  Do not drive or operate heavy machinery while taking prescription pain medicine.  Follow instructions from your health care provider about eating  or drinking restrictions.  Return to your normal activities as told by your health care provider. Ask your health care provider what activities are safe for you. Avoiding Future Gout Attacks  Follow a low-purine diet as told by your dietitian or health care provider. Avoid foods and drinks that are high in purines, including liver, kidney, anchovies, asparagus, herring, mushrooms, mussels, and beer.  Limit alcohol intake to no more than 1 drink a day for nonpregnant women and 2 drinks a day for men. One drink equals 12 oz of beer, 5 oz of wine, or 1 oz of hard liquor.  Maintain a healthy weight or lose weight if you are overweight. If you want to lose weight, talk with your health care provider. It is important that you do not lose weight too quickly.  Start or maintain an exercise program as told by your health care provider.  Drink enough fluids to keep your urine clear or pale yellow.  Take over-the-counter and prescription medicines only as told by your health care provider.  Keep all follow-up visits as told by your health care provider. This is important. Contact a health care provider if:  You have another gout attack.  You continue to have symptoms of a gout attack after10 days of treatment.  You have side effects from your medicines.  You have chills or a fever.  You have burning pain when you urinate.  You have pain in your lower back or belly. Get help right away if:  You have severe or uncontrolled pain.  You cannot urinate. This information is not intended to replace advice given to you by your health care provider. Make sure you discuss any questions you have with your health care provider. Document Released: 10/20/2000 Document Revised: 03/30/2016 Document Reviewed: 08/05/2015 Elsevier Interactive Patient Education  [employer].

## 2018-10-10 NOTE — Progress Notes (Signed)
Subjective:    Patient ID: [name], male    DOB: [birth year], 72 y.o.   MRN: [id number]  No chief complaint on file.   HPI Patient was seen today for follow-up on chronic conditions and TOC, previously seen by Dr. Burnice Logan.  Diabetes type 2: -taking metformin 1000 mg twice daily and glipizide XL 2.5 mg daily -Rarely checks FS BS at home -eating "everything I'm not suppose to" -also has a h/o CAD s/p bypass, carotid artery stenosis, renal dz.  Dyslipidemia: Taking Lipitor 80 mg daily -Denies myalgias  HTN: -Not checking BP at home -taking Norvasc 10 mg daily, lisinopril, Lopressor 50 mg -not monitoring sodium intake.  Does a lot of walking at work  H/o Gout: -typically in R hand -no recent symptoms  Chronic knee pian: -takes tramadol q am -if pain continues during the day will take an 2nd dose  Requesting influenza vaccine  Past Medical History:  Diagnosis Date  . CAD, ARTERY BYPASS GRAFT 02/10/2008  . CAROTID ARTERY STENOSIS, WITHOUT INFARCTION 09/22/2008  . CORONARY ARTERY DISEASE 04/03/2008  . Diabetes mellitus    "borderline"  . ERECTILE DYSFUNCTION 01/14/2009  . GOUT 01/16/2008  . HERPES ZOSTER 04/15/2010  . HYPERLIPIDEMIA 01/16/2008  . HYPERTENSION 01/16/2008  . IMPAIRED GLUCOSE TOLERANCE 08/22/2010  . Shingles 2011   left chest  . Ulcer 30 yrs ago   stomach    No Known Allergies  ROS General: Denies fever, chills, night sweats, changes in weight, changes in appetite HEENT: Denies headaches, ear pain, changes in vision, rhinorrhea, sore throat CV: Denies CP, palpitations, SOB, orthopnea Pulm: Denies SOB, cough, wheezing GI: Denies abdominal pain, nausea, vomiting, diarrhea, constipation GU: Denies dysuria, hematuria, frequency, vaginal discharge Msk: Denies muscle cramps  + joint pains Neuro: Denies weakness, numbness, tingling Skin: Denies rashes, bruising Psych: Denies depression, anxiety, hallucinations     Objective:    Blood pressure  110/70, pulse (!) 58, temperature 97.9 F (36.6 C), temperature source Oral, weight 280 lb (127 kg), SpO2 96 %.  Gen. Pleasant, well-nourished, in no distress, normal affect   HEENT: [place]/AT, face symmetric, no scleral icterus, PERRLA, nares patent without drainage Lungs: no accessory muscle use, CTAB, no wheezes or rales Cardiovascular: RRR, no m/r/g, no peripheral edema Musculoskeletal: No deformities, no cyanosis or clubbing, normal tone Neuro:  A&Ox3, CN II-XII intact, normal gait Skin:  Warm, no lesions/ rash  Wt Readings from Last 3 Encounters:  10/10/18 280 lb (127 kg)  06/06/18 273 lb 3.2 oz (123.9 kg)  01/28/18 277 lb (125.6 kg)    Lab Results  Component Value Date   WBC 7.3 07/21/2016   HGB 12.7 (L) 07/21/2016   HCT 38.9 07/21/2016   PLT 230 07/21/2016   GLUCOSE 134 (H) 12/01/2016   CHOL 184 08/09/2017   TRIG 97 08/09/2017   HDL 60 08/09/2017   LDLDIRECT 130.4 11/06/2011   LDLCALC 105 (H) 08/09/2017   ALT 24 08/09/2017   AST 21 08/09/2017   NA 139 12/01/2016   K 4.8 12/01/2016   CL 107 12/01/2016   CREATININE 1.10 12/01/2016   BUN 21 12/01/2016   CO2 21 12/01/2016   TSH 2.31 07/21/2016   PSA 0.31 06/23/2014   INR 1.5 02/10/2008   HGBA1C 6.1 (A) 06/06/2018   MICROALBUR 18.3 07/21/2016    Assessment/Plan:  Type 2 diabetes mellitus with peripheral vascular disease ([place]) -controlled -last hgb A1C 6.1 % -Discussed importance of control given CAD and kidney disease. -Continue metformin 1000 mg twice daily  and glipizide XL 2.5 mg daily -Modification strongly encouraged  Essential hypertension -Controlled -Continue current medications including milligrams, lisinopril 10 mg, Lopressor 50 mg -Continue follow-up with cardiology -Continue lifestyle modification  Dyslipidemia  -Discussed lifestyle modifications -Continue follow-up with cardiology - Plan: atorvastatin (LIPITOR) 80 MG tablet  Gout of right hand, unspecified cause, unspecified  chronicity -stable -Avoid foods known to cause symptoms -Discussed increasing p.o. intake of water  Chronic pain of both knees  - Plan: traMADol (ULTRAM) 50 MG tablet  Need for influenza vaccination  - Plan: Flu vaccine HIGH DOSE PF   F/u in 3 months  Grier Mitts, MD

## 2018-11-18 ENCOUNTER — Other Ambulatory Visit: Payer: Self-pay | Admitting: Family Medicine

## 2018-11-18 DIAGNOSIS — G8929 Other chronic pain: Secondary | ICD-10-CM

## 2018-11-18 DIAGNOSIS — M25562 Pain in left knee: Principal | ICD-10-CM

## 2018-11-18 DIAGNOSIS — M25561 Pain in right knee: Principal | ICD-10-CM

## 2018-11-19 NOTE — Telephone Encounter (Signed)
Pt LOV was 10/10/2018 and last refill was done 60 tablets please advise

## 2019-01-29 ENCOUNTER — Other Ambulatory Visit: Payer: Self-pay | Admitting: Family Medicine

## 2019-01-29 DIAGNOSIS — M25562 Pain in left knee: Principal | ICD-10-CM

## 2019-01-29 DIAGNOSIS — M25561 Pain in right knee: Principal | ICD-10-CM

## 2019-01-29 DIAGNOSIS — G8929 Other chronic pain: Secondary | ICD-10-CM

## 2019-02-03 NOTE — Telephone Encounter (Signed)
Pt LOV was on 10/10/2018 and Last refill was done on 11/25/18 for 60 tablets with 1 refill

## 2019-03-12 LAB — HM DIABETES EYE EXAM

## 2019-04-08 ENCOUNTER — Telehealth: Payer: Self-pay | Admitting: Family Medicine

## 2019-04-08 NOTE — Telephone Encounter (Signed)
Copied from [place] [phone]. Topic: Quick Communication - Rx Refill/Question >> Apr 08, 2019  3:04 PM Selinda Flavin B, NT wrote: Medication: amLODipine (NORVASC) 10 MG tablet   Has the patient contacted their pharmacy? yes (Agent: If no, request that the patient contact the pharmacy for the refill.) (Agent: If yes, when and what did the pharmacy advise?)  Preferred Pharmacy (with phone number or street name): [facility]  Agent: Please be advised that RX refills may take up to 3 business days. We ask that you follow-up with your pharmacy.

## 2019-04-09 ENCOUNTER — Other Ambulatory Visit: Payer: Self-pay

## 2019-04-09 MED ORDER — AMLODIPINE BESYLATE 10 MG PO TABS: 10.0000 mg | ORAL_TABLET | Freq: Every day | ORAL | 1 refills | Status: DC

## 2019-04-09 NOTE — Telephone Encounter (Signed)
Rx was sent to pt pharmacy

## 2019-05-06 ENCOUNTER — Other Ambulatory Visit: Payer: Self-pay | Admitting: Family Medicine

## 2019-05-06 DIAGNOSIS — M25561 Pain in right knee: Secondary | ICD-10-CM

## 2019-05-06 DIAGNOSIS — G8929 Other chronic pain: Secondary | ICD-10-CM

## 2019-05-07 NOTE — Telephone Encounter (Signed)
Pt LOV is was on 10/10/2018 and last refill was done on 02/03/2019 for 60 tablets with 1 refill, please advise

## 2019-05-19 ENCOUNTER — Ambulatory Visit: Payer: Medicare Other | Admitting: Family Medicine

## 2019-05-21 ENCOUNTER — Other Ambulatory Visit: Payer: Self-pay

## 2019-05-21 ENCOUNTER — Ambulatory Visit (INDEPENDENT_AMBULATORY_CARE_PROVIDER_SITE_OTHER): Payer: BC Managed Care – PPO | Admitting: Family Medicine

## 2019-05-21 ENCOUNTER — Encounter: Payer: Self-pay | Admitting: Family Medicine

## 2019-05-21 VITALS — BP 140/70 | HR 70 | Temp 99.0°F | Wt 279.0 lb

## 2019-05-21 DIAGNOSIS — R58 Hemorrhage, not elsewhere classified: Secondary | ICD-10-CM | POA: Diagnosis not present

## 2019-05-21 DIAGNOSIS — I10 Essential (primary) hypertension: Secondary | ICD-10-CM

## 2019-05-21 DIAGNOSIS — R222 Localized swelling, mass and lump, trunk: Secondary | ICD-10-CM | POA: Diagnosis not present

## 2019-05-21 DIAGNOSIS — E785 Hyperlipidemia, unspecified: Secondary | ICD-10-CM | POA: Diagnosis not present

## 2019-05-21 DIAGNOSIS — E1151 Type 2 diabetes mellitus with diabetic peripheral angiopathy without gangrene: Secondary | ICD-10-CM | POA: Diagnosis not present

## 2019-05-21 NOTE — Patient Instructions (Signed)
Managing Your Hypertension Hypertension is commonly called high blood pressure. This is when the force of your blood pressing against the walls of your arteries is too strong. Arteries are blood vessels that carry blood from your heart throughout your body. Hypertension forces the heart to work harder to pump blood, and may cause the arteries to become narrow or stiff. Having untreated or uncontrolled hypertension can cause heart attack, stroke, kidney disease, and other problems. What are blood pressure readings? A blood pressure reading consists of a higher number over a lower number. Ideally, your blood pressure should be below 120/80. The first ("top") number is called the systolic pressure. It is a measure of the pressure in your arteries as your heart beats. The second ("bottom") number is called the diastolic pressure. It is a measure of the pressure in your arteries as the heart relaxes. What does my blood pressure reading mean? Blood pressure is classified into four stages. Based on your blood pressure reading, your health care provider may use the following stages to determine what type of treatment you need, if any. Systolic pressure and diastolic pressure are measured in a unit called mm Hg. Normal  Systolic pressure: below 784.  Diastolic pressure: below 80. Elevated  Systolic pressure: 696-295.  Diastolic pressure: below 80. Hypertension stage 1  Systolic pressure: 284-132.  Diastolic pressure: 44-01. Hypertension stage 2  Systolic pressure: 027 or above.  Diastolic pressure: 90 or above. What health risks are associated with hypertension? Managing your hypertension is an important responsibility. Uncontrolled hypertension can lead to:  A heart attack.  A stroke.  A weakened blood vessel (aneurysm).  Heart failure.  Kidney damage.  Eye damage.  Metabolic syndrome.  Memory and concentration problems. What changes can I make to manage my hypertension?  Hypertension can be managed by making lifestyle changes and possibly by taking medicines. Your health care provider will help you make a plan to bring your blood pressure within a normal range. Eating and drinking   Eat a diet that is high in fiber and potassium, and low in salt (sodium), added sugar, and fat. An example eating plan is called the DASH (Dietary Approaches to Stop Hypertension) diet. To eat this way: ? Eat plenty of fresh fruits and vegetables. Try to fill half of your plate at each meal with fruits and vegetables. ? Eat whole grains, such as whole wheat pasta, brown rice, or whole grain bread. Fill about one quarter of your plate with whole grains. ? Eat low-fat diary products. ? Avoid fatty cuts of meat, processed or cured meats, and poultry with skin. Fill about one quarter of your plate with lean proteins such as fish, chicken without skin, beans, eggs, and tofu. ? Avoid premade and processed foods. These tend to be higher in sodium, added sugar, and fat.  Reduce your daily sodium intake. Most people with hypertension should eat less than 1,500 mg of sodium a day.  Limit alcohol intake to no more than 1 drink a day for nonpregnant women and 2 drinks a day for men. One drink equals 12 oz of beer, 5 oz of wine, or 1 oz of hard liquor. Lifestyle  Work with your health care provider to maintain a healthy body weight, or to lose weight. Ask what an ideal weight is for you.  Get at least 30 minutes of exercise that causes your heart to beat faster (aerobic exercise) most days of the week. Activities may include walking, swimming, or biking.  Include exercise  to strengthen your muscles (resistance exercise), such as weight lifting, as part of your weekly exercise routine. Try to do these types of exercises for 30 minutes at least 3 days a week.  Do not use any products that contain nicotine or tobacco, such as cigarettes and e-cigarettes. If you need help quitting, ask your health  care provider.  Control any long-term (chronic) conditions you have, such as high cholesterol or diabetes. Monitoring  Monitor your blood pressure at home as told by your health care provider. Your personal target blood pressure may vary depending on your medical conditions, your age, and other factors.  Have your blood pressure checked regularly, as often as told by your health care provider. Working with your health care provider  Review all the medicines you take with your health care provider because there may be side effects or interactions.  Talk with your health care provider about your diet, exercise habits, and other lifestyle factors that may be contributing to hypertension.  Visit your health care provider regularly. Your health care provider can help you create and adjust your plan for managing hypertension. Will I need medicine to control my blood pressure? Your health care provider may prescribe medicine if lifestyle changes are not enough to get your blood pressure under control, and if:  Your systolic blood pressure is 130 or higher.  Your diastolic blood pressure is 80 or higher. Take medicines only as told by your health care provider. Follow the directions carefully. Blood pressure medicines must be taken as prescribed. The medicine does not work as well when you skip doses. Skipping doses also puts you at risk for problems. Contact a health care provider if:  You think you are having a reaction to medicines you have taken.  You have repeated (recurrent) headaches.  You feel dizzy.  You have swelling in your ankles.  You have trouble with your vision. Get help right away if:  You develop a severe headache or confusion.  You have unusual weakness or numbness, or you feel faint.  You have severe pain in your chest or abdomen.  You vomit repeatedly.  You have trouble breathing. Summary  Hypertension is when the force of blood pumping through your arteries  is too strong. If this condition is not controlled, it may put you at risk for serious complications.  Your personal target blood pressure may vary depending on your medical conditions, your age, and other factors. For most people, a normal blood pressure is less than 120/80.  Hypertension is managed by lifestyle changes, medicines, or both. Lifestyle changes include weight loss, eating a healthy, low-sodium diet, exercising more, and limiting alcohol. This information is not intended to replace advice given to you by your health care provider. Make sure you discuss any questions you have with your health care provider. Document Released: 07/17/2012 Document Revised: 02/14/2019 Document Reviewed: 09/20/2016 Elsevier Patient Education  2020 [employer].  Managing Your Hypertension Hypertension is commonly called high blood pressure. This is when the force of your blood pressing against the walls of your arteries is too strong. Arteries are blood vessels that carry blood from your heart throughout your body. Hypertension forces the heart to work harder to pump blood, and may cause the arteries to become narrow or stiff. Having untreated or uncontrolled hypertension can cause heart attack, stroke, kidney disease, and other problems. What are blood pressure readings? A blood pressure reading consists of a higher number over a lower number. Ideally, your blood pressure should  be below 120/80. The first ("top") number is called the systolic pressure. It is a measure of the pressure in your arteries as your heart beats. The second ("bottom") number is called the diastolic pressure. It is a measure of the pressure in your arteries as the heart relaxes. What does my blood pressure reading mean? Blood pressure is classified into four stages. Based on your blood pressure reading, your health care provider may use the following stages to determine what type of treatment you need, if any. Systolic pressure and  diastolic pressure are measured in a unit called mm Hg. Normal  Systolic pressure: below 270.  Diastolic pressure: below 80. Elevated  Systolic pressure: 623-762.  Diastolic pressure: below 80. Hypertension stage 1  Systolic pressure: 831-517.  Diastolic pressure: 61-60. Hypertension stage 2  Systolic pressure: 737 or above.  Diastolic pressure: 90 or above. What health risks are associated with hypertension? Managing your hypertension is an important responsibility. Uncontrolled hypertension can lead to:  A heart attack.  A stroke.  A weakened blood vessel (aneurysm).  Heart failure.  Kidney damage.  Eye damage.  Metabolic syndrome.  Memory and concentration problems. What changes can I make to manage my hypertension? Hypertension can be managed by making lifestyle changes and possibly by taking medicines. Your health care provider will help you make a plan to bring your blood pressure within a normal range. Eating and drinking   Eat a diet that is high in fiber and potassium, and low in salt (sodium), added sugar, and fat. An example eating plan is called the DASH (Dietary Approaches to Stop Hypertension) diet. To eat this way: ? Eat plenty of fresh fruits and vegetables. Try to fill half of your plate at each meal with fruits and vegetables. ? Eat whole grains, such as whole wheat pasta, brown rice, or whole grain bread. Fill about one quarter of your plate with whole grains. ? Eat low-fat diary products. ? Avoid fatty cuts of meat, processed or cured meats, and poultry with skin. Fill about one quarter of your plate with lean proteins such as fish, chicken without skin, beans, eggs, and tofu. ? Avoid premade and processed foods. These tend to be higher in sodium, added sugar, and fat.  Reduce your daily sodium intake. Most people with hypertension should eat less than 1,500 mg of sodium a day.  Limit alcohol intake to no more than 1 drink a day for nonpregnant  women and 2 drinks a day for men. One drink equals 12 oz of beer, 5 oz of wine, or 1 oz of hard liquor. Lifestyle  Work with your health care provider to maintain a healthy body weight, or to lose weight. Ask what an ideal weight is for you.  Get at least 30 minutes of exercise that causes your heart to beat faster (aerobic exercise) most days of the week. Activities may include walking, swimming, or biking.  Include exercise to strengthen your muscles (resistance exercise), such as weight lifting, as part of your weekly exercise routine. Try to do these types of exercises for 30 minutes at least 3 days a week.  Do not use any products that contain nicotine or tobacco, such as cigarettes and e-cigarettes. If you need help quitting, ask your health care provider.  Control any long-term (chronic) conditions you have, such as high cholesterol or diabetes. Monitoring  Monitor your blood pressure at home as told by your health care provider. Your personal target blood pressure may vary depending on your  medical conditions, your age, and other factors.  Have your blood pressure checked regularly, as often as told by your health care provider. Working with your health care provider  Review all the medicines you take with your health care provider because there may be side effects or interactions.  Talk with your health care provider about your diet, exercise habits, and other lifestyle factors that may be contributing to hypertension.  Visit your health care provider regularly. Your health care provider can help you create and adjust your plan for managing hypertension. Will I need medicine to control my blood pressure? Your health care provider may prescribe medicine if lifestyle changes are not enough to get your blood pressure under control, and if:  Your systolic blood pressure is 130 or higher.  Your diastolic blood pressure is 80 or higher. Take medicines only as told by your health  care provider. Follow the directions carefully. Blood pressure medicines must be taken as prescribed. The medicine does not work as well when you skip doses. Skipping doses also puts you at risk for problems. Contact a health care provider if:  You think you are having a reaction to medicines you have taken.  You have repeated (recurrent) headaches.  You feel dizzy.  You have swelling in your ankles.  You have trouble with your vision. Get help right away if:  You develop a severe headache or confusion.  You have unusual weakness or numbness, or you feel faint.  You have severe pain in your chest or abdomen.  You vomit repeatedly.  You have trouble breathing. Summary  Hypertension is when the force of blood pumping through your arteries is too strong. If this condition is not controlled, it may put you at risk for serious complications.  Your personal target blood pressure may vary depending on your medical conditions, your age, and other factors. For most people, a normal blood pressure is less than 120/80.  Hypertension is managed by lifestyle changes, medicines, or both. Lifestyle changes include weight loss, eating a healthy, low-sodium diet, exercising more, and limiting alcohol. This information is not intended to replace advice given to you by your health care provider. Make sure you discuss any questions you have with your health care provider. Document Released: 07/17/2012 Document Revised: 02/14/2019 Document Reviewed: 09/20/2016 Elsevier Patient Education  2020 [employer].

## 2019-05-21 NOTE — Progress Notes (Signed)
Subjective:    Patient ID: CEPHUS TUPY, male    DOB: 11/10/1945, 73 y.o.   MRN: [id number]  No chief complaint on file.   HPI Patient was seen today for acute concern.  Pt notes increased bruising without injury.  Mostly on abdomen.  Pt also notes a "knot" in L lower abdomen present x 1 wk.  Area is not painful.  Pt denies fever, chills, fatigue, cough, n/v, diarrhea.    Pt has a h/o DM which has been controlled on po meds.  Pt has not checked his fsbs recently.  States wants to get a new meter where he doesn't have to prick his finger.  States bp has been ok.  Taking lisinopril 10 mg, lopressor 50 mg BID, norvasc 10 mg.  Taking lipitor 80 mg.    Pt has a h/o b/l hand injury 2/2 falling on a stove when a child. Past Medical History:  Diagnosis Date  . CAD, ARTERY BYPASS GRAFT 02/10/2008  . CAROTID ARTERY STENOSIS, WITHOUT INFARCTION 09/22/2008  . CORONARY ARTERY DISEASE 04/03/2008  . Diabetes mellitus    "borderline"  . ERECTILE DYSFUNCTION 01/14/2009  . GOUT 01/16/2008  . HERPES ZOSTER 04/15/2010  . HYPERLIPIDEMIA 01/16/2008  . HYPERTENSION 01/16/2008  . IMPAIRED GLUCOSE TOLERANCE 08/22/2010  . Shingles 2011   left chest  . Ulcer 30 yrs ago   stomach    No Known Allergies  ROS General: Denies fever, chills, night sweats, changes in weight, changes in appetite HEENT: Denies headaches, ear pain, changes in vision, rhinorrhea, sore throat CV: Denies CP, palpitations, SOB, orthopnea Pulm: Denies SOB, cough, wheezing GI: Denies abdominal pain, nausea, vomiting, diarrhea, constipation GU: Denies dysuria, hematuria, frequency, vaginal discharge Msk: Denies muscle cramps, joint pains Neuro: Denies weakness, numbness, tingling Skin: Denies rashes  +bruising, "knot" in side of abdomen Psych: Denies depression, anxiety, hallucinations      Objective:    Blood pressure 140/70, pulse 70, temperature 99 F (37.2 C), temperature source Oral, weight 279 lb (126.6 kg), SpO2 95  %.   Gen. Pleasant, well-nourished, in no distress, normal affect   HEENT: [place]/AT, face symmetric, conjunctiva clear, no scleral icterus, PERRLA, nares patent without drainage.  No cervical or supraclavicular lymphadenopathy. Lungs: no accessory muscle use, CTAB, no wheezes or rales Cardiovascular: RRR, no m/r/g, no peripheral edema Abdomen: BS present, soft, NT/ND Musculoskeletal: Deformity of R hand and L 5th digit.  No cyanosis or clubbing, normal tone Neuro:  A&Ox3, CN II-XII intact, normal gait Skin:  Warm, no lesions/ rash.   An area of ecchymosis on L lower abdomen with a small, less than 6 mm rounded nodule deep to the area.  Another area of ecchymosis on RUQ of abdomen.  Well healed incision of midline abdomen.  Contracture of skin of palmar surface of R hand across 3rd and 4th MCP joints.  L 5th digit flexed 2/2 contracture   Wt Readings from Last 3 Encounters:  05/21/19 279 lb (126.6 kg)  10/10/18 280 lb (127 kg)  06/06/18 273 lb 3.2 oz (123.9 kg)    Lab Results  Component Value Date   WBC 7.3 07/21/2016   HGB 12.7 (L) 07/21/2016   HCT 38.9 07/21/2016   PLT 230 07/21/2016   GLUCOSE 134 (H) 12/01/2016   CHOL 184 08/09/2017   TRIG 97 08/09/2017   HDL 60 08/09/2017   LDLDIRECT 130.4 11/06/2011   LDLCALC 105 (H) 08/09/2017   ALT 24 08/09/2017   AST 21 08/09/2017   NA 139  12/01/2016   K 4.8 12/01/2016   CL 107 12/01/2016   CREATININE 1.10 12/01/2016   BUN 21 12/01/2016   CO2 21 12/01/2016   TSH 2.31 07/21/2016   PSA 0.31 06/23/2014   INR 1.5 02/10/2008   HGBA1C 6.1 (A) 06/06/2018   MICROALBUR 18.3 07/21/2016    Assessment/Plan:  Ecchymosis  - Plan: Comprehensive metabolic panel, CBC with Differential/Platelet  Type 2 diabetes mellitus with peripheral vascular disease ([place]) -continue glipizide XL 2.5 mg, metformin 1000 mg BID  - Plan: Hemoglobin A1c  Essential hypertension  -elevated -continue lifestyle modifications -continue current meds lisinopril 10  mg, norvasc 10 mg, and lopressor 50 mg  - Plan: Comprehensive metabolic panel  Dyslipidemia  -continue lipitor 80 mg daily - Plan: Lipid panel  Subcutaneous mass of abdominal wall  - Plan: CBC -consider imaging.  F/u in 2-4 wks  Grier Mitts, MD

## 2019-05-22 ENCOUNTER — Encounter: Payer: Self-pay | Admitting: Family Medicine

## 2019-05-22 LAB — COMPREHENSIVE METABOLIC PANEL
ALT: 18 U/L (ref 0–53)
AST: 17 U/L (ref 0–37)
Albumin: 3.5 g/dL (ref 3.5–5.2)
Alkaline Phosphatase: 81 U/L (ref 39–117)
BUN: 21 mg/dL (ref 6–23)
CO2: 25 mEq/L (ref 19–32)
Calcium: 9.3 mg/dL (ref 8.4–10.5)
Chloride: 107 mEq/L (ref 96–112)
Creatinine, Ser: 1.05 mg/dL (ref 0.40–1.50)
GFR: 84.3 mL/min (ref >60.00)
Glucose, Bld: 119 mg/dL — ABNORMAL HIGH (ref 70–99)
Potassium: 5 mEq/L (ref 3.5–5.1)
Sodium: 138 mEq/L (ref 135–145)
Total Bilirubin: 0.4 mg/dL (ref 0.2–1.2)
Total Protein: 7.1 g/dL (ref 6.0–8.3)

## 2019-05-22 LAB — CBC WITH DIFFERENTIAL/PLATELET
Basophils Absolute: 0.1 10*3/uL (ref 0.0–0.1)
Basophils Relative: 1.1 % (ref 0.0–3.0)
Eosinophils Absolute: 0.2 10*3/uL (ref 0.0–0.7)
Eosinophils Relative: 3.3 % (ref 0.0–5.0)
HCT: 37.6 % — ABNORMAL LOW (ref 39.0–52.0)
Hemoglobin: 12.4 g/dL — ABNORMAL LOW (ref 13.0–17.0)
Lymphocytes Relative: 28.4 % (ref 12.0–46.0)
Lymphs Abs: 1.8 10*3/uL (ref 0.7–4.0)
MCHC: 33 g/dL (ref 30.0–36.0)
MCV: 96.8 fl (ref 78.0–100.0)
Monocytes Absolute: 0.7 10*3/uL (ref 0.1–1.0)
Monocytes Relative: 10.1 % (ref 3.0–12.0)
Neutro Abs: 3.7 10*3/uL (ref 1.4–7.7)
Neutrophils Relative %: 57.1 % (ref 43.0–77.0)
Platelets: 197 10*3/uL (ref 150.0–400.0)
RBC: 3.88 Mil/uL — ABNORMAL LOW (ref 4.22–5.81)
RDW: 14.5 % (ref 11.5–15.5)
WBC: 6.5 10*3/uL (ref 4.0–10.5)

## 2019-05-22 LAB — LIPID PANEL
Cholesterol: 176 mg/dL (ref 0–200)
HDL: 63.9 mg/dL (ref >39.00)
LDL Cholesterol: 98 mg/dL (ref 0–99)
NonHDL: 112.26
Total CHOL/HDL Ratio: 3
Triglycerides: 69 mg/dL (ref 0.0–149.0)
VLDL: 13.8 mg/dL (ref 0.0–40.0)

## 2019-05-22 LAB — HEMOGLOBIN A1C: Hgb A1c MFr Bld: 6.7 % — ABNORMAL HIGH (ref 4.6–6.5)

## 2019-06-10 ENCOUNTER — Telehealth: Payer: Self-pay | Admitting: *Deleted

## 2019-06-10 NOTE — Telephone Encounter (Signed)
Copied from [place] [id number]. Topic: General - Other >> Jun 06, 2019  1:32 PM Nils Flack wrote: Reason for CRM: [id number]  Pt returning a call, did not see documentation  Please call back

## 2019-06-11 NOTE — Telephone Encounter (Signed)
Returned patient call. Notified him of lab results. Per Dr.Banks his hgb A1C was 6.7%. This is up slightly from last yr when it was 6.1%. Your cholesterol was good and your other labs were largely normal. Patient verbalized understanding.

## 2019-07-08 ENCOUNTER — Other Ambulatory Visit: Payer: Self-pay | Admitting: Family Medicine

## 2019-07-08 DIAGNOSIS — G8929 Other chronic pain: Secondary | ICD-10-CM

## 2019-07-08 DIAGNOSIS — M25561 Pain in right knee: Secondary | ICD-10-CM

## 2019-07-09 NOTE — Telephone Encounter (Signed)
Pt LOV was 05/21/2019 and last refill was 05/14/2019, please advise

## 2019-07-29 ENCOUNTER — Other Ambulatory Visit: Payer: Self-pay

## 2019-07-29 ENCOUNTER — Ambulatory Visit (HOSPITAL_COMMUNITY)
Admission: RE | Admit: 2019-07-29 | Discharge: 2019-07-29 | Disposition: A | Discharge status: Home / Self Care | Payer: BC Managed Care – PPO | Source: Ambulatory Visit | Attending: Cardiovascular Disease | Admitting: Cardiovascular Disease

## 2019-07-29 DIAGNOSIS — I6529 Occlusion and stenosis of unspecified carotid artery: Secondary | ICD-10-CM

## 2019-09-12 ENCOUNTER — Other Ambulatory Visit: Payer: Self-pay | Admitting: Family Medicine

## 2019-09-12 MED ORDER — ALLOPURINOL 100 MG PO TABS: 100.0000 mg | ORAL_TABLET | Freq: Every day | ORAL | 2 refills | Status: DC

## 2019-09-12 NOTE — Telephone Encounter (Signed)
Copied from [place] [phone]. Topic: Quick Communication - Rx Refill/Question >> Sep 12, 2019  2:20 PM Izola Price, [place] A wrote: Medication: allopurinol (ZYLOPRIM) 100 MG tablet (Patients spouse stated that pharmacy has tried reaching out to office to get medication sent over)  Has the patient contacted their pharmacy? Yes (Agent: If no, request that the patient contact the pharmacy for the refill.) (Agent: If yes, when and what did the pharmacy advise?)Contact PCP  Preferred Pharmacy (with phone number or street name): [facility], [place] [phone] (Phone) [phone] (Fax)    Agent: Please be advised that RX refills may take up to 3 business days. We ask that you follow-up with your pharmacy.

## 2019-09-12 NOTE — Telephone Encounter (Signed)
Requested medication (s) are due for refill today: yes  Requested medication (s) are on the active medication list: yes  Last refill:  05/2018  Future visit scheduled: no  Notes to clinic: last filled by different provider review for refill   Requested Prescriptions  Pending Prescriptions Disp Refills   allopurinol (ZYLOPRIM) 100 MG tablet 90 tablet 2    Sig: Take 1 tablet (100 mg total) by mouth daily.     Endocrinology:  Gout Agents Failed - 09/12/2019  2:25 PM      Failed - Uric Acid in normal range and within 360 days    Uric Acid, Serum  Date Value Ref Range Status  01/14/2009 8.0 (H) 4.0 - 7.8 mg/dL Final         Passed - Cr in normal range and within 360 days    Creat  Date Value Ref Range Status  12/01/2016 1.10 0.70 - 1.25 mg/dL Final    Comment:      For patients > or = 71 years of age: The upper reference limit for Creatinine is approximately 13% higher for people identified as African-American.      Creatinine, Ser  Date Value Ref Range Status  05/21/2019 1.05 0.40 - 1.50 mg/dL Final         Passed - Valid encounter within last 12 months    Recent Outpatient Visits          3 months ago [facility], MD   11 months ago Type 2 diabetes mellitus with peripheral vascular disease ([place])   [facility], Langley Adie, MD   1 year ago Essential hypertension   [facility], Doretha Sou, MD   1 year ago Essential hypertension   [facility] at [facility], Doretha Sou, MD   2 years ago Need for pneumococcal vaccine   [facility], Doretha Sou, MD

## 2019-09-12 NOTE — Telephone Encounter (Signed)
Please see pt rx refill request

## 2019-09-17 ENCOUNTER — Other Ambulatory Visit: Payer: Self-pay | Admitting: Family Medicine

## 2019-09-17 DIAGNOSIS — E785 Hyperlipidemia, unspecified: Secondary | ICD-10-CM

## 2019-09-26 ENCOUNTER — Other Ambulatory Visit: Payer: Self-pay | Admitting: Family Medicine

## 2019-09-26 DIAGNOSIS — M25562 Pain in left knee: Secondary | ICD-10-CM

## 2019-09-26 DIAGNOSIS — G8929 Other chronic pain: Secondary | ICD-10-CM

## 2019-09-26 NOTE — Telephone Encounter (Signed)
Pt LOV was7/15/2020 and last refill was on 07/11/2019 for 60 with 1 refill, please advise

## 2019-10-10 ENCOUNTER — Other Ambulatory Visit: Payer: Self-pay | Admitting: Family Medicine

## 2019-11-17 ENCOUNTER — Other Ambulatory Visit: Payer: Self-pay

## 2019-11-17 MED ORDER — TAMSULOSIN HCL 0.4 MG PO CAPS: 0.4000 mg | ORAL_CAPSULE | Freq: Every day | ORAL | 1 refills | Status: DC

## 2019-11-17 MED ORDER — METOPROLOL TARTRATE 50 MG PO TABS: 50.0000 mg | ORAL_TABLET | Freq: Two times a day (BID) | ORAL | 1 refills | Status: DC

## 2019-12-11 ENCOUNTER — Telehealth: Payer: Self-pay | Admitting: Family Medicine

## 2019-12-11 NOTE — Telephone Encounter (Signed)
Pt's spouse, Arbie Cookey, called in and asked if it's okay for him to receive the [place] can be reached at [phone]

## 2019-12-12 NOTE — Telephone Encounter (Signed)
advise

## 2019-12-12 NOTE — Telephone Encounter (Signed)
Please advise 

## 2019-12-15 ENCOUNTER — Other Ambulatory Visit: Payer: Self-pay | Admitting: Family Medicine

## 2019-12-15 DIAGNOSIS — G8929 Other chronic pain: Secondary | ICD-10-CM

## 2019-12-15 NOTE — Telephone Encounter (Signed)
Pt LOV was on 05/21/2019 and last refill was done on 09/29/2019 for 60 tablets with 1 refill. Please advise

## 2019-12-17 NOTE — Telephone Encounter (Signed)
Left a voice message for pt with Dr Volanda Napoleon recommendation

## 2019-12-17 NOTE — Telephone Encounter (Signed)
Ok to get COVID vaccine.

## 2020-01-02 ENCOUNTER — Ambulatory Visit: Payer: BC Managed Care – PPO | Attending: Internal Medicine

## 2020-01-02 DIAGNOSIS — Z23 Encounter for immunization: Secondary | ICD-10-CM

## 2020-01-02 NOTE — Progress Notes (Signed)
   Covid-19 Vaccination Clinic  Name:  [name]    MRN: [id number] DOB: 01-04-1946  01/02/2020  [name] was observed post Covid-19 immunization for 15 minutes without incidence. He was provided with Vaccine Information Sheet and instruction to access the V-Safe system.   [name] was instructed to call 911 with any severe reactions post vaccine: Marland Kitchen Difficulty breathing  . Swelling of your face and throat  . A fast heartbeat  . A bad rash all over your body  . Dizziness and weakness    Immunizations Administered    Name Date Dose VIS Date Route   Pfizer COVID-19 Vaccine 01/02/2020  1:25 PM 0.3 mL 10/17/2019 Intramuscular   Manufacturer: [place]   Lot: [id number]   [place]: [id number]

## 2020-01-14 ENCOUNTER — Telehealth: Payer: Self-pay

## 2020-01-14 ENCOUNTER — Telehealth: Payer: Self-pay | Admitting: Family Medicine

## 2020-01-14 NOTE — Telephone Encounter (Signed)
Please advise 

## 2020-01-14 NOTE — Telephone Encounter (Signed)
Spoke with pt regarding his abdominal pain, advised pt per Dr Volanda Napoleon to go to the ED for further Eval, pt verbalized understanding state that he will get transportation to the ED

## 2020-01-14 NOTE — Telephone Encounter (Signed)
Pt is having lower right side extreme pain in his abdomen for a week straight. He is not experiencing any other symptoms. He would like to be seen in office as soon as possible?  Pt can be reached at [phone]

## 2020-01-15 NOTE — Telephone Encounter (Signed)
Pt should go to the ED.

## 2020-01-16 NOTE — Telephone Encounter (Signed)
Spoke with pt regarding his abdominal pain, advised pt per Dr Volanda Napoleon to go to the ED for further Eval, pt verbalized understanding state that he will get transportation to the ED

## 2020-01-28 ENCOUNTER — Ambulatory Visit: Payer: BC Managed Care – PPO | Attending: Internal Medicine

## 2020-01-28 DIAGNOSIS — Z23 Encounter for immunization: Secondary | ICD-10-CM

## 2020-01-28 NOTE — Progress Notes (Signed)
   Covid-19 Vaccination Clinic  Name:  [name]    MRN: [id number] DOB: 06/21/1946  01/28/2020  [name] was observed post Covid-19 immunization for 15 minutes without incident. He was provided with Vaccine Information Sheet and instruction to access the V-Safe system.   [name] was instructed to call 911 with any severe reactions post vaccine: Marland Kitchen Difficulty breathing  . Swelling of face and throat  . A fast heartbeat  . A bad rash all over body  . Dizziness and weakness   Immunizations Administered    Name Date Dose VIS Date Route   Pfizer COVID-19 Vaccine 01/28/2020 12:41 PM 0.3 mL 10/17/2019 Intramuscular   Manufacturer: [place]   Lot: [id number]   [place]: [id number]

## 2020-02-25 ENCOUNTER — Other Ambulatory Visit: Payer: Self-pay | Admitting: Family Medicine

## 2020-02-25 DIAGNOSIS — G8929 Other chronic pain: Secondary | ICD-10-CM

## 2020-02-25 DIAGNOSIS — M25562 Pain in left knee: Secondary | ICD-10-CM

## 2020-02-25 NOTE — Telephone Encounter (Signed)
Pt [name] was on 05/21/2019 and last refill was doe on 12/22/2019 for 60 tablets with 1 refill, pt has a CPE scheduled for 04/08/2020 at 11.30 am, please advise

## 2020-04-08 ENCOUNTER — Encounter: Payer: Self-pay | Admitting: Family Medicine

## 2020-04-08 ENCOUNTER — Ambulatory Visit (INDEPENDENT_AMBULATORY_CARE_PROVIDER_SITE_OTHER): Payer: BC Managed Care – PPO | Admitting: Family Medicine

## 2020-04-08 ENCOUNTER — Other Ambulatory Visit: Payer: Self-pay

## 2020-04-08 VITALS — BP 130/78 | HR 60 | Temp 97.9°F | Wt 303.0 lb

## 2020-04-08 DIAGNOSIS — M109 Gout, unspecified: Secondary | ICD-10-CM | POA: Diagnosis not present

## 2020-04-08 DIAGNOSIS — Z0000 Encounter for general adult medical examination without abnormal findings: Secondary | ICD-10-CM

## 2020-04-08 DIAGNOSIS — G629 Polyneuropathy, unspecified: Secondary | ICD-10-CM | POA: Diagnosis not present

## 2020-04-08 DIAGNOSIS — Z1159 Encounter for screening for other viral diseases: Secondary | ICD-10-CM

## 2020-04-08 DIAGNOSIS — E785 Hyperlipidemia, unspecified: Secondary | ICD-10-CM | POA: Diagnosis not present

## 2020-04-08 DIAGNOSIS — D170 Benign lipomatous neoplasm of skin and subcutaneous tissue of head, face and neck: Secondary | ICD-10-CM

## 2020-04-08 DIAGNOSIS — Z1211 Encounter for screening for malignant neoplasm of colon: Secondary | ICD-10-CM

## 2020-04-08 DIAGNOSIS — Z125 Encounter for screening for malignant neoplasm of prostate: Secondary | ICD-10-CM | POA: Diagnosis not present

## 2020-04-08 DIAGNOSIS — E1151 Type 2 diabetes mellitus with diabetic peripheral angiopathy without gangrene: Secondary | ICD-10-CM

## 2020-04-08 LAB — LIPID PANEL
Cholesterol: 164 mg/dL (ref 0–200)
HDL: 67.1 mg/dL (ref >39.00)
LDL Cholesterol: 86 mg/dL (ref 0–99)
NonHDL: 97.24
Total CHOL/HDL Ratio: 2
Triglycerides: 58 mg/dL (ref 0.0–149.0)
VLDL: 11.6 mg/dL (ref 0.0–40.0)

## 2020-04-08 LAB — CBC
HCT: 35.7 % — ABNORMAL LOW (ref 39.0–52.0)
Hemoglobin: 11.9 g/dL — ABNORMAL LOW (ref 13.0–17.0)
MCHC: 33.2 g/dL (ref 30.0–36.0)
MCV: 96.6 fl (ref 78.0–100.0)
Platelets: 198 10*3/uL (ref 150.0–400.0)
RBC: 3.7 Mil/uL — ABNORMAL LOW (ref 4.22–5.81)
RDW: 14.7 % (ref 11.5–15.5)
WBC: 5.7 10*3/uL (ref 4.0–10.5)

## 2020-04-08 LAB — COMPREHENSIVE METABOLIC PANEL
ALT: 17 U/L (ref 0–53)
AST: 19 U/L (ref 0–37)
Albumin: 3.5 g/dL (ref 3.5–5.2)
Alkaline Phosphatase: 93 U/L (ref 39–117)
BUN: 23 mg/dL (ref 6–23)
CO2: 26 mEq/L (ref 19–32)
Calcium: 9.2 mg/dL (ref 8.4–10.5)
Chloride: 106 mEq/L (ref 96–112)
Creatinine, Ser: 1.11 mg/dL (ref 0.40–1.50)
GFR: 78.86 mL/min (ref >60.00)
Glucose, Bld: 154 mg/dL — ABNORMAL HIGH (ref 70–99)
Potassium: 4.9 mEq/L (ref 3.5–5.1)
Sodium: 137 mEq/L (ref 135–145)
Total Bilirubin: 0.5 mg/dL (ref 0.2–1.2)
Total Protein: 7 g/dL (ref 6.0–8.3)

## 2020-04-08 LAB — PSA, MEDICARE: PSA: 0.17 ng/ml (ref 0.10–4.00)

## 2020-04-08 LAB — URIC ACID: Uric Acid, Serum: 7.1 mg/dL (ref 4.0–7.8)

## 2020-04-08 LAB — HEMOGLOBIN A1C: Hgb A1c MFr Bld: 6.9 % — ABNORMAL HIGH (ref 4.6–6.5)

## 2020-04-08 MED ORDER — COLCHICINE 0.6 MG PO TABS: ORAL_TABLET | ORAL | 3 refills | Status: DC

## 2020-04-08 MED ORDER — FREESTYLE LIBRE SENSOR SYSTEM MISC: 11 refills | Status: DC

## 2020-04-08 MED ORDER — FREESTYLE LIBRE 14 DAY READER DEVI: 0 refills | Status: DC

## 2020-04-08 NOTE — Progress Notes (Signed)
Annual Wellness Visit   Patient: [name], Male    DOB: 05-11-1946, 74 y.o.   MRN: [id number] Visit Date: 04/08/2020  Today's Provider: [name], MD  Subjective:   No chief complaint on file.  [name] is a 74 y.o. male who presents today for his Annual Wellness Visit.  HPI Pt states he has been doing well.  Having gout flare in L hand/thumb x 2-3 days.  Tried ibuprofen for the pain.  Pt notes fsbs around 150s.  Pt interested in freestyle libre meter/sensors.  Pt with LE edema.  Does a lot of standing at work and does not take many breaks.  Pt taking Lipitor 80 mg for cholesterol.  Denies myalgias.  Past Medical History:  Diagnosis Date  . CAD, ARTERY BYPASS GRAFT 02/10/2008  . CAROTID ARTERY STENOSIS, WITHOUT INFARCTION 09/22/2008  . CORONARY ARTERY DISEASE 04/03/2008  . Diabetes mellitus    "borderline"  . ERECTILE DYSFUNCTION 01/14/2009  . GOUT 01/16/2008  . HERPES ZOSTER 04/15/2010  . HYPERLIPIDEMIA 01/16/2008  . HYPERTENSION 01/16/2008  . IMPAIRED GLUCOSE TOLERANCE 08/22/2010  . Shingles 2011   left chest  . Ulcer 30 yrs ago   stomach   Past Surgical History:  Procedure Laterality Date  . COLONOSCOPY     3 months ago  . CORONARY ARTERY BYPASS GRAFT  2009   vessels x3  . EAR CYST EXCISION N/A 07/29/2013   Procedure: excision of urachal cyst flexible cystoscopy insertion of foley cath;  Surgeon: Fredricka Bonine, MD;  Location: [facility];  Service: Urology;  Laterality: N/A;  . LAPAROSCOPY N/A 07/29/2013   Procedure: LAPAROSCOPY DIAGNOSTIC  laparoscopic excision of urachal cyst ;  Surgeon: Madilyn Hook, DO;  Location: [facility];  Service: General;  Laterality: N/A;  . LAPAROTOMY  08/25/2012   Procedure: EXPLORATORY LAPAROTOMY;  Surgeon: Madilyn Hook, DO;  Location: [facility];  Service: General;  Laterality: N/A;  incision and drainage abdominal wall abcessand intra abdominal wall abcess  . PARTIAL GASTRECTOMY  1974   bleeding ulcers  . urachal  2014   Urachal cyst removal   No Known Allergies     Medications: Outpatient Medications Prior to Visit  Medication Sig  . allopurinol (ZYLOPRIM) 100 MG tablet Take 1 tablet (100 mg total) by mouth daily.  Marland Kitchen amLODipine (NORVASC) 10 MG tablet TAKE 1 TABLET BY MOUTH  DAILY  . aspirin EC 325 MG tablet Take 325 mg by mouth every morning.   Marland Kitchen atorvastatin (LIPITOR) 80 MG tablet TAKE 1 TABLET BY MOUTH EVERY MORNING.  Marland Kitchen Cholecalciferol (VITAMIN D3) 1000 UNITS CAPS Take 1 capsule by mouth daily.  Marland Kitchen glipiZIDE (GLUCOTROL XL) 2.5 MG 24 hr tablet Take 1 tablet (2.5 mg total) by mouth every evening.  Marland Kitchen glucose blood (ONE TOUCH ULTRA TEST) test strip 1 each by Other route daily. Use as instructed  . ibuprofen (ADVIL,MOTRIN) 200 MG tablet Take 400 mg by mouth every 6 (six) hours as needed. For pain  . Lancets (ONETOUCH ULTRASOFT) lancets 1 each by Other route daily. Use as instructed  . lisinopril (PRINIVIL,ZESTRIL) 10 MG tablet Take 1 tablet (10 mg total) by mouth daily.  . meloxicam (MOBIC) 7.5 MG tablet Take 1 tablet (7.5 mg total) by mouth daily.  . metFORMIN (GLUCOPHAGE) 1000 MG tablet Take 1 tablet (1,000 mg total) by mouth 2 (two) times daily with a meal.  . metoprolol tartrate (LOPRESSOR) 50 MG tablet Take 1 tablet (50 mg total) by mouth 2 (two)  times daily.  . nitroGLYCERIN (NITROSTAT) 0.4 MG SL tablet Place 1 tablet (0.4 mg total) under the tongue every 5 (five) minutes as needed for chest pain.  . Omega 3-6-9 Fatty Acids (OMEGA-3 & OMEGA-6 FISH OIL PO) Take 1,000 mg by mouth daily.   . tamsulosin (FLOMAX) 0.4 MG CAPS capsule Take 1 capsule (0.4 mg total) by mouth daily.  . traMADol (ULTRAM) 50 MG tablet TAKE 1 TABLET BY MOUTH TWO TIMES DAILY AS NEEDED FOR PAIN   No facility-administered medications prior to visit.    No Known Allergies  Patient Care Team: [name], MD as PCP - General (Family Medicine) [name], OD as Referring Physician (Optometry)  Review of Systems     General: Denies fever, chills, night sweats, changes in weight, changes in appetite HEENT: Denies headaches, ear pain, changes in vision, rhinorrhea, sore throat CV: Denies CP, palpitations, SOB, orthopnea  +LE edema Pulm: Denies SOB, cough, wheezing GI: Denies abdominal pain, nausea, vomiting, diarrhea, constipation GU: Denies dysuria, hematuria, frequency, vaginal discharge Msk: Denies muscle cramps, joint pains  +L hand pain Neuro: Denies weakness, numbness, tingling Skin: Denies rashes, bruising Psych: Denies depression, anxiety, hallucinations  Objective:    Vitals: BP 130/78 (BP Location: Left Arm, Patient Position: Sitting, Cuff Size: Large)   Pulse 60   Temp 97.9 F (36.6 C) (Temporal)   Wt (!) 303 lb (137.4 kg)   SpO2 97%   BMI 42.86 kg/m    Physical Exam  Gen. Pleasant, well developed, obese, well-nourished, in NAD HEENT - [place]/AT, PERRL, EOMI, conjunctive clear, no scleral icterus, no nasal drainage, pharynx without erythema or exudate. Neck: No JVD, no thyromegaly, no carotid bruits Lungs: no use of accessory muscles, CTAB, no wheezes, rales or rhonchi Cardiovascular: RRR, No r/g/m, 1+ pitting edema in b/l LEs to knee, R>L Abdomen: BS present, soft, nontender,nondistended, no hepatosplenomegaly Musculoskeletal: No deformities, moves all four extremities, no cyanosis or clubbing, normal tone Neuro:  A&Ox3, CN II-XII intact, normal gait Skin:  Warm, dry, intact, no lesions.  Lipoma L supraclavicular area/lateral L neck.  Diabetic Foot Exam - Simple   Simple Foot Form Diabetic Foot exam was performed with the following findings: Yes 04/08/2020 12:30 PM  Visual Inspection See comments: Yes Sensation Testing See comments: Yes Pulse Check Posterior Tibialis and Dorsalis pulse intact bilaterally: Yes Comments B/l pedal edema, R>L.  Decreased sensation to vibratory and monofilament testing.  Overgrown thickened toenails b/l feet.  Callous of plantar surface R great MTP  joint.     Most recent functional status assessment: No flowsheet data found.  Most recent fall risk assessment: Fall Risk  04/08/2020  Falls in the past year? 0     Most recent depression screenings: PHQ 2/9 Scores 04/08/2020 12/01/2016  PHQ - 2 Score 0 0    Most recent cognitive screening: No flowsheet data found.  No results found for any visits on 04/08/20.     Assessment & Plan:      Annual wellness visit done today including the all of the following: Reviewed patient's Family Medical History Reviewed and updated list of patient's medical providers Assessment of cognitive impairment was done Assessed patient's functional ability Established a written schedule for health screening services Health Risk Assessent Completed and Reviewed  Acute gout of left hand, unspecified cause  -on allopurinol daily - Plan: CBC (no diff), Uric Acid, Comprehensive metabolic panel, colchicine 0.6 MG tablet, Ambulatory referral to Podiatry, For Home Use Only DME Diabetic Shoe  Type 2  diabetes mellitus with peripheral vascular disease ([place])  -continue current meds metformin 1000 mg BID - Plan: Lipid panel, Hemoglobin A1c, Continuous Blood Gluc Sensor ([place]) MISC, Continuous Blood Gluc Receiver (FREESTYLE LIBRE 14 DAY READER) DEVI  Neuropathy -2/2 DM -consider gabapentin for worsened symptoms  Lipoma of neck -given handout -continue to monitor  Screening for prostate cancer  - Plan: PSA, Medicare  Screen for colon cancer  -typically has q 5 yrs. - Plan: Ambulatory referral to Gastroenterology  Need for hepatitis C screening test  - Plan: Hep C Antibody  Dyslipidemia  -continue lipitor 80 mg daily -lifestyle modifications encouraged. - Plan: Lipid panel    Exercise Activities and Dietary recommendations Goals   None     Immunization History  Administered Date(s) Administered  . Influenza Split 08/27/2012  . Influenza Whole 08/22/2010  .  Influenza, High Dose Seasonal PF 11/12/2014, 07/21/2016, 08/27/2017, 10/10/2018, 08/04/2019  . Influenza,inj,Quad PF,6+ Mos 07/30/2013  . Influenza-Unspecified 08/04/2019  . PFIZER SARS-COV-2 Vaccination 01/02/2020, 01/28/2020  . Pneumococcal Conjugate-13 07/01/2015  . Pneumococcal Polysaccharide-23 12/01/2016  . Tdap 11/13/2011    Health Maintenance  Topic Date Due  . Hepatitis C Screening  Never done  . COLONOSCOPY  06/24/2017  . HEMOGLOBIN A1C  11/21/2019  . OPHTHALMOLOGY EXAM  03/11/2020  . INFLUENZA VACCINE  06/06/2020  . FOOT EXAM  04/08/2021  . TETANUS/TDAP  11/12/2021  . COVID-19 Vaccine  Completed  . PNA vac Low Risk Adult  Completed     Discussed health benefits of physical activity, and encouraged him to engage in regular exercise appropriate for his age and condition.     F/u in then next month  [name], MD

## 2020-04-08 NOTE — Patient Instructions (Signed)
Preventive Care 72 Years and Older, Male Preventive care refers to lifestyle choices and visits with your health care provider that can promote health and wellness. This includes:  A yearly physical exam. This is also called an annual well check.  Regular dental and eye exams.  Immunizations.  Screening for certain conditions.  Healthy lifestyle choices, such as diet and exercise. What can I expect for my preventive care visit? Physical exam Your health care provider will check:  Height and weight. These may be used to calculate body mass index (BMI), which is a measurement that tells if you are at a healthy weight.  Heart rate and blood pressure.  Your skin for abnormal spots. Counseling Your health care provider may ask you questions about:  Alcohol, tobacco, and drug use.  Emotional well-being.  Home and relationship well-being.  Sexual activity.  Eating habits.  History of falls.  Memory and ability to understand (cognition).  Work and work Statistician. What immunizations do I need?  Influenza (flu) vaccine  This is recommended every year. Tetanus, diphtheria, and pertussis (Tdap) vaccine  You may need a Td booster every 10 years. Varicella (chickenpox) vaccine  You may need this vaccine if you have not already been vaccinated. Zoster (shingles) vaccine  You may need this after age 70. Pneumococcal conjugate (PCV13) vaccine  One dose is recommended after age 40. Pneumococcal polysaccharide (PPSV23) vaccine  One dose is recommended after age 24. Measles, mumps, and rubella (MMR) vaccine  You may need at least one dose of MMR if you were born in 1955 or later. You may also need a second dose. Meningococcal conjugate (MenACWY) vaccine  You may need this if you have certain conditions. Hepatitis A vaccine  You may need this if you have certain conditions or if you travel or work in places where you may be exposed to hepatitis A. Hepatitis B  vaccine  You may need this if you have certain conditions or if you travel or work in places where you may be exposed to hepatitis B. Haemophilus influenzae type b (Hib) vaccine  You may need this if you have certain conditions. You may receive vaccines as individual doses or as more than one vaccine together in one shot (combination vaccines). Talk with your health care provider about the risks and benefits of combination vaccines. What tests do I need? Blood tests  Lipid and cholesterol levels. These may be checked every 5 years, or more frequently depending on your overall health.  Hepatitis C test.  Hepatitis B test. Screening  Lung cancer screening. You may have this screening every year starting at age 67 if you have a 30-pack-year history of smoking and currently smoke or have quit within the past 15 years.  Colorectal cancer screening. All adults should have this screening starting at age 77 and continuing until age 8. Your health care provider may recommend screening at age 74 if you are at increased risk. You will have tests every 1-10 years, depending on your results and the type of screening test.  Prostate cancer screening. Recommendations will vary depending on your family history and other risks.  Diabetes screening. This is done by checking your blood sugar (glucose) after you have not eaten for a while (fasting). You may have this done every 1-3 years.  Abdominal aortic aneurysm (AAA) screening. You may need this if you are a current or former smoker.  Sexually transmitted disease (STD) testing. Follow these instructions at home: Eating and drinking  Eat  a diet that includes fresh fruits and vegetables, whole grains, lean protein, and low-fat dairy products. Limit your intake of foods with high amounts of sugar, saturated fats, and salt.  Take vitamin and mineral supplements as recommended by your health care provider.  Do not drink alcohol if your health care  provider tells you not to drink.  If you drink alcohol: ? Limit how much you have to 0-2 drinks a day. ? Be aware of how much alcohol is in your drink. In the [place] one drink equals one 12 oz bottle of beer (355 mL), one 5 oz glass of wine (148 mL), or one 1 oz glass of hard liquor (44 mL). Lifestyle  Take daily care of your teeth and gums.  Stay active. Exercise for at least 30 minutes on 5 or more days each week.  Do not use any products that contain nicotine or tobacco, such as cigarettes, e-cigarettes, and chewing tobacco. If you need help quitting, ask your health care provider.  If you are sexually active, practice safe sex. Use a condom or other form of protection to prevent STIs (sexually transmitted infections).  Talk with your health care provider about taking a low-dose aspirin or statin. What's next?  Visit your health care provider once a year for a well check visit.  Ask your health care provider how often you should have your eyes and teeth checked.  Stay up to date on all vaccines. This information is not intended to replace advice given to you by your health care provider. Make sure you discuss any questions you have with your health care provider. Document Revised: 10/17/2018 Document Reviewed: 10/17/2018 Elsevier Patient Education  [place].  Peripheral Neuropathy Peripheral neuropathy is a type of nerve damage. It affects nerves that carry signals between the spinal cord and the arms, legs, and the rest of the body (peripheral nerves). It does not affect nerves in the spinal cord or brain. In peripheral neuropathy, one nerve or a group of nerves may be damaged. Peripheral neuropathy is a broad category that includes many specific nerve disorders, like diabetic neuropathy, hereditary neuropathy, and carpal tunnel syndrome. What are the causes? This condition may be caused by:  Diabetes. This is the most common cause of peripheral neuropathy.  Nerve  injury.  Pressure or stress on a nerve that lasts a long time.  Lack (deficiency) of B vitamins. This can result from alcoholism, poor diet, or a restricted diet.  Infections.  Autoimmune diseases, such as rheumatoid arthritis and systemic lupus erythematosus.  Nerve diseases that are passed from parent to child (inherited).  Some medicines, such as cancer medicines (chemotherapy).  Poisonous (toxic) substances, such as lead and mercury.  Too little blood flowing to the legs.  Kidney disease.  Thyroid disease. In some cases, the cause of this condition is not known. What are the signs or symptoms? Symptoms of this condition depend on which of your nerves is damaged. Common symptoms include:  Loss of feeling (numbness) in the feet, hands, or both.  Tingling in the feet, hands, or both.  Burning pain.  Very sensitive skin.  Weakness.  Not being able to move a part of the body (paralysis).  Muscle twitching.  Clumsiness or poor coordination.  Loss of balance.  Not being able to control your bladder.  Feeling dizzy.  Sexual problems. How is this diagnosed? Diagnosing and finding the cause of peripheral neuropathy can be difficult. Your health care provider will take your medical history  and do a physical exam. A neurological exam will also be done. This involves checking things that are affected by your brain, spinal cord, and nerves (nervous system). For example, your health care provider will check your reflexes, how you move, and what you can feel. You may have other tests, such as:  Blood tests.  Electromyogram (EMG) and nerve conduction tests. These tests check nerve function and how well the nerves are controlling the muscles.  Imaging tests, such as CT scans or MRI to rule out other causes of your symptoms.  Removing a small piece of nerve to be examined in a lab (nerve biopsy). This is rare.  Removing and examining a small amount of the fluid that  surrounds the brain and spinal cord (lumbar puncture). This is rare. How is this treated? Treatment for this condition may involve:  Treating the underlying cause of the neuropathy, such as diabetes, kidney disease, or vitamin deficiencies.  Stopping medicines that can cause neuropathy, such as chemotherapy.  Medicine to relieve pain. Medicines may include: ? Prescription or over-the-counter pain medicine. ? Antiseizure medicine. ? Antidepressants. ? Pain-relieving patches that are applied to painful areas of skin.  Surgery to relieve pressure on a nerve or to destroy a nerve that is causing pain.  Physical therapy to help improve movement and balance.  Devices to help you move around (assistive devices). Follow these instructions at home: Medicines  Take over-the-counter and prescription medicines only as told by your health care provider. Do not take any other medicines without first asking your health care provider.  Do not drive or use heavy machinery while taking prescription pain medicine. Lifestyle   Do not use any products that contain nicotine or tobacco, such as cigarettes and e-cigarettes. Smoking keeps blood from reaching damaged nerves. If you need help quitting, ask your health care provider.  Avoid or limit alcohol. Too much alcohol can cause a vitamin B deficiency, and vitamin B is needed for healthy nerves.  Eat a healthy diet. This includes: ? Eating foods that are high in fiber, such as fresh fruits and vegetables, whole grains, and beans. ? Limiting foods that are high in fat and processed sugars, such as fried or sweet foods. General instructions   If you have diabetes, work closely with your health care provider to keep your blood sugar under control.  If you have numbness in your feet: ? Check every day for signs of injury or infection. Watch for redness, warmth, and swelling. ? Wear padded socks and comfortable shoes. These help protect your  feet.  Develop a good support system. Living with peripheral neuropathy can be stressful. Consider talking with a mental health specialist or joining a support group.  Use assistive devices and attend physical therapy as told by your health care provider. This may include using a walker or a cane.  Keep all follow-up visits as told by your health care provider. This is important. Contact a health care provider if:  You have new signs or symptoms of peripheral neuropathy.  You are struggling emotionally from dealing with peripheral neuropathy.  Your pain is not well-controlled. Get help right away if:  You have an injury or infection that is not healing normally.  You develop new weakness in an arm or leg.  You fall frequently. Summary  Peripheral neuropathy is when the nerves in the arms, or legs are damaged, resulting in numbness, weakness, or pain.  There are many causes of peripheral neuropathy, including diabetes, pinched nerves,  vitamin deficiencies, autoimmune disease, and hereditary conditions.  Diagnosing and finding the cause of peripheral neuropathy can be difficult. Your health care provider will take your medical history, do a physical exam, and do tests, including blood tests and nerve function tests.  Treatment involves treating the underlying cause of the neuropathy and taking medicines to help control pain. Physical therapy and assistive devices may also help. This information is not intended to replace advice given to you by your health care provider. Make sure you discuss any questions you have with your health care provider. Document Revised: 10/05/2017 Document Reviewed: 01/01/2017 Elsevier Patient Education  [place].  Lipoma  A lipoma is a noncancerous (benign) tumor that is made up of fat cells. This is a very common type of soft-tissue growth. Lipomas are usually found under the skin (subcutaneous). They may occur in any tissue of the body that  contains fat. Common areas for lipomas to appear include the back, arms, shoulders, buttocks, and thighs. Lipomas grow slowly, and they are usually painless. Most lipomas do not cause problems and do not require treatment. What are the causes? The cause of this condition is not known. What increases the risk? You are more likely to develop this condition if:  You are 62-48 years old.  You have a family history of lipomas. What are the signs or symptoms? A lipoma usually appears as a small, round bump under the skin. In most cases, the lump will:  Feel soft or rubbery.  Not cause pain or other symptoms. However, if a lipoma is located in an area where it pushes on nerves, it can become painful or cause other symptoms. How is this diagnosed? A lipoma can usually be diagnosed with a physical exam. You may also have tests to confirm the diagnosis and to rule out other conditions. Tests may include:  Imaging tests, such as a CT scan or an MRI.  Removal of a tissue sample to be looked at under a microscope (biopsy). How is this treated? Treatment for this condition depends on the size of the lipoma and whether it is causing any symptoms.  For small lipomas that are not causing problems, no treatment is needed.  If a lipoma is bigger or it causes problems, surgery may be done to remove the lipoma. Lipomas can also be removed to improve appearance. Most often, the procedure is done after applying a medicine that numbs the area (local anesthetic).  Liposuction may be done to reduce the size of the lipoma before it is removed through surgery, or it may be done to remove the lipoma. Lipomas are removed with this method in order to limit incision size and scarring. A liposuction tube is inserted through a small incision into the lipoma, and the contents of the lipoma are removed through the tube with suction. Follow these instructions at home:  Watch your lipoma for any changes.  Keep all  follow-up visits as told by your health care provider. This is important. Contact a health care provider if:  Your lipoma becomes larger or hard.  Your lipoma becomes painful, red, or increasingly swollen. These could be signs of infection or a more serious condition. Get help right away if:  You develop tingling or numbness in an area near the lipoma. This could indicate that the lipoma is causing nerve damage. Summary  A lipoma is a noncancerous tumor that is made up of fat cells.  Most lipomas do not cause problems and do not require  treatment.  If a lipoma is bigger or it causes problems, surgery may be done to remove the lipoma.  Contact a health care provider if your lipoma becomes larger or hard, or if it becomes painful, red, or increasingly swollen. Pain, redness, and swelling could be signs of infection or a more serious condition. This information is not intended to replace advice given to you by your health care provider. Make sure you discuss any questions you have with your health care provider. Document Revised: 06/09/2019 Document Reviewed: 06/09/2019 Elsevier Patient Education  [place].  Gout  Gout is painful swelling of your joints. Gout is a type of arthritis. It is caused by having too much uric acid in your body. Uric acid is a chemical that is made when your body breaks down substances called purines. If your body has too much uric acid, sharp crystals can form and build up in your joints. This causes pain and swelling. Gout attacks can happen quickly and be very painful (acute gout). Over time, the attacks can affect more joints and happen more often (chronic gout). What are the causes?  Too much uric acid in your blood. This can happen because: ? Your kidneys do not remove enough uric acid from your blood. ? Your body makes too much uric acid. ? You eat too many foods that are high in purines. These foods include organ meats, some seafood, and  beer.  Trauma or stress. What increases the risk?  Having a family history of gout.  Being male and middle-aged.  Being male and having gone through menopause.  Being very overweight (obese).  Drinking alcohol, especially beer.  Not having enough water in the body (being dehydrated).  Losing weight too quickly.  Having an organ transplant.  Having lead poisoning.  Taking certain medicines.  Having kidney disease.  Having a skin condition called psoriasis. What are the signs or symptoms? An attack of acute gout usually happens in just one joint. The most common place is the big toe. Attacks often start at night. Other joints that may be affected include joints of the feet, ankle, knee, fingers, wrist, or elbow. Symptoms of an attack may include:  Very bad pain.  Warmth.  Swelling.  Stiffness.  Shiny, red, or purple skin.  Tenderness. The affected joint may be very painful to touch.  Chills and fever. Chronic gout may cause symptoms more often. More joints may be involved. You may also have white or yellow lumps (tophi) on your hands or feet or in other areas near your joints. How is this treated?  Treatment for this condition has two phases: treating an acute attack and preventing future attacks.  Acute gout treatment may include: ? NSAIDs. ? Steroids. These are taken by mouth or injected into a joint. ? Colchicine. This medicine relieves pain and swelling. It can be given by mouth or through an IV tube.  Preventive treatment may include: ? Taking small doses of NSAIDs or colchicine daily. ? Using a medicine that reduces uric acid levels in your blood. ? Making changes to your diet. You may need to see a food expert (dietitian) about what to eat and drink to prevent gout. Follow these instructions at home: During a gout attack   If told, put ice on the painful area: ? Put ice in a plastic bag. ? Place a towel between your skin and the bag. ? Leave the  ice on for 20 minutes, 2-3 times a day.  Raise (  elevate) the painful joint above the level of your heart as often as you can.  Rest the joint as much as possible. If the joint is in your leg, you may be given crutches.  Follow instructions from your doctor about what you cannot eat or drink. Avoiding future gout attacks  Eat a low-purine diet. Avoid foods and drinks such as: ? Liver. ? Kidney. ? Anchovies. ? Asparagus. ? Herring. ? Mushrooms. ? Mussels. ? Beer.  Stay at a healthy weight. If you want to lose weight, talk with your doctor. Do not lose weight too fast.  Start or continue an exercise plan as told by your doctor. Eating and drinking  Drink enough fluids to keep your pee (urine) pale yellow.  If you drink alcohol: ? Limit how much you use to:  0-1 drink a day for women.  0-2 drinks a day for men. ? Be aware of how much alcohol is in your drink. In the [place] one drink equals one 12 oz bottle of beer (355 mL), one 5 oz glass of wine (148 mL), or one 1 oz glass of hard liquor (44 mL). General instructions  Take over-the-counter and prescription medicines only as told by your doctor.  Do not drive or use heavy machinery while taking prescription pain medicine.  Return to your normal activities as told by your doctor. Ask your doctor what activities are safe for you.  Keep all follow-up visits as told by your doctor. This is important. Contact a doctor if:  You have another gout attack.  You still have symptoms of a gout attack after 10 days of treatment.  You have problems (side effects) because of your medicines.  You have chills or a fever.  You have burning pain when you pee (urinate).  You have pain in your lower back or belly. Get help right away if:  You have very bad pain.  Your pain cannot be controlled.  You cannot pee. Summary  Gout is painful swelling of the joints.  The most common site of pain is the big toe, but it can affect  other joints.  Medicines and avoiding some foods can help to prevent and treat gout attacks. This information is not intended to replace advice given to you by your health care provider. Make sure you discuss any questions you have with your health care provider. Document Revised: 05/15/2018 Document Reviewed: 05/15/2018 Elsevier Patient Education  [place].  Gout  Gout is painful swelling of your joints. Gout is a type of arthritis. It is caused by having too much uric acid in your body. Uric acid is a chemical that is made when your body breaks down substances called purines. If your body has too much uric acid, sharp crystals can form and build up in your joints. This causes pain and swelling. Gout attacks can happen quickly and be very painful (acute gout). Over time, the attacks can affect more joints and happen more often (chronic gout). What are the causes?  Too much uric acid in your blood. This can happen because: ? Your kidneys do not remove enough uric acid from your blood. ? Your body makes too much uric acid. ? You eat too many foods that are high in purines. These foods include organ meats, some seafood, and beer.  Trauma or stress. What increases the risk?  Having a family history of gout.  Being male and middle-aged.  Being male and having gone through menopause.  Being very overweight (  obese).  Drinking alcohol, especially beer.  Not having enough water in the body (being dehydrated).  Losing weight too quickly.  Having an organ transplant.  Having lead poisoning.  Taking certain medicines.  Having kidney disease.  Having a skin condition called psoriasis. What are the signs or symptoms? An attack of acute gout usually happens in just one joint. The most common place is the big toe. Attacks often start at night. Other joints that may be affected include joints of the feet, ankle, knee, fingers, wrist, or elbow. Symptoms of an attack may  include:  Very bad pain.  Warmth.  Swelling.  Stiffness.  Shiny, red, or purple skin.  Tenderness. The affected joint may be very painful to touch.  Chills and fever. Chronic gout may cause symptoms more often. More joints may be involved. You may also have white or yellow lumps (tophi) on your hands or feet or in other areas near your joints. How is this treated?  Treatment for this condition has two phases: treating an acute attack and preventing future attacks.  Acute gout treatment may include: ? NSAIDs. ? Steroids. These are taken by mouth or injected into a joint. ? Colchicine. This medicine relieves pain and swelling. It can be given by mouth or through an IV tube.  Preventive treatment may include: ? Taking small doses of NSAIDs or colchicine daily. ? Using a medicine that reduces uric acid levels in your blood. ? Making changes to your diet. You may need to see a food expert (dietitian) about what to eat and drink to prevent gout. Follow these instructions at home: During a gout attack   If told, put ice on the painful area: ? Put ice in a plastic bag. ? Place a towel between your skin and the bag. ? Leave the ice on for 20 minutes, 2-3 times a day.  Raise (elevate) the painful joint above the level of your heart as often as you can.  Rest the joint as much as possible. If the joint is in your leg, you may be given crutches.  Follow instructions from your doctor about what you cannot eat or drink. Avoiding future gout attacks  Eat a low-purine diet. Avoid foods and drinks such as: ? Liver. ? Kidney. ? Anchovies. ? Asparagus. ? Herring. ? Mushrooms. ? Mussels. ? Beer.  Stay at a healthy weight. If you want to lose weight, talk with your doctor. Do not lose weight too fast.  Start or continue an exercise plan as told by your doctor. Eating and drinking  Drink enough fluids to keep your pee (urine) pale yellow.  If you drink alcohol: ? Limit how  much you use to:  0-1 drink a day for women.  0-2 drinks a day for men. ? Be aware of how much alcohol is in your drink. In the [place] one drink equals one 12 oz bottle of beer (355 mL), one 5 oz glass of wine (148 mL), or one 1 oz glass of hard liquor (44 mL). General instructions  Take over-the-counter and prescription medicines only as told by your doctor.  Do not drive or use heavy machinery while taking prescription pain medicine.  Return to your normal activities as told by your doctor. Ask your doctor what activities are safe for you.  Keep all follow-up visits as told by your doctor. This is important. Contact a doctor if:  You have another gout attack.  You still have symptoms of a gout attack after 10  days of treatment.  You have problems (side effects) because of your medicines.  You have chills or a fever.  You have burning pain when you pee (urinate).  You have pain in your lower back or belly. Get help right away if:  You have very bad pain.  Your pain cannot be controlled.  You cannot pee. Summary  Gout is painful swelling of the joints.  The most common site of pain is the big toe, but it can affect other joints.  Medicines and avoiding some foods can help to prevent and treat gout attacks. This information is not intended to replace advice given to you by your health care provider. Make sure you discuss any questions you have with your health care provider. Document Revised: 05/15/2018 Document Reviewed: 05/15/2018 Elsevier Patient Education  [place].  Peripheral Edema  Peripheral edema is swelling that is caused by a buildup of fluid. Peripheral edema most often affects the lower legs, ankles, and feet. It can also develop in the arms, hands, and face. The area of the body that has peripheral edema will look swollen. It may also feel heavy or warm. Your clothes may start to feel tight. Pressing on the area may make a temporary dent in your  skin. You may not be able to move your swollen arm or leg as much as usual. There are many causes of peripheral edema. It can happen because of a complication of other conditions such as congestive heart failure, kidney disease, or a problem with your blood circulation. It also can be a side effect of certain medicines or because of an infection. It often happens to women during pregnancy. Sometimes, the cause is not known. Follow these instructions at home: Managing pain, stiffness, and swelling   Raise (elevate) your legs while you are sitting or lying down.  Move around often to prevent stiffness and to lessen swelling.  Do not sit or stand for long periods of time.  Wear support stockings as told by your health care provider. Medicines  Take over-the-counter and prescription medicines only as told by your health care provider.  Your health care provider may prescribe medicine to help your body get rid of excess water (diuretic). General instructions  Pay attention to any changes in your symptoms.  Follow instructions from your health care provider about limiting salt (sodium) in your diet. Sometimes, eating less salt may reduce swelling.  Moisturize skin daily to help prevent skin from cracking and draining.  Keep all follow-up visits as told by your health care provider. This is important. Contact a health care provider if you have:  A fever.  Edema that starts suddenly or is getting worse, especially if you are pregnant or have a medical condition.  Swelling in only one leg.  Increased swelling, redness, or pain in one or both of your legs.  Drainage or sores at the area where you have edema. Get help right away if you:  Develop shortness of breath, especially when you are lying down.  Have pain in your chest or abdomen.  Feel weak.  Feel faint. Summary  Peripheral edema is swelling that is caused by a buildup of fluid. Peripheral edema most often affects the  lower legs, ankles, and feet.  Move around often to prevent stiffness and to lessen swelling. Do not sit or stand for long periods of time.  Pay attention to any changes in your symptoms.  Contact a health care provider if you have edema that  starts suddenly or is getting worse, especially if you are pregnant or have a medical condition.  Get help right away if you develop shortness of breath, especially when lying down. This information is not intended to replace advice given to you by your health care provider. Make sure you discuss any questions you have with your health care provider. Document Revised: 07/17/2018 Document Reviewed: 07/17/2018 Elsevier Patient Education  [place].

## 2020-04-09 LAB — HEPATITIS C ANTIBODY
Hepatitis C Ab: NONREACTIVE
SIGNAL TO CUT-OFF: 0.05 (ref <1.00)

## 2020-05-11 ENCOUNTER — Other Ambulatory Visit: Payer: Self-pay | Admitting: Family Medicine

## 2020-05-11 DIAGNOSIS — G8929 Other chronic pain: Secondary | ICD-10-CM

## 2020-05-11 DIAGNOSIS — M25562 Pain in left knee: Secondary | ICD-10-CM

## 2020-05-11 NOTE — Telephone Encounter (Signed)
Pt LOV was on 04/08/2020 and last refill was 02/27/2020 for 60 tablets with 1 refill, please advise

## 2020-05-12 DIAGNOSIS — E119 Type 2 diabetes mellitus without complications: Secondary | ICD-10-CM | POA: Diagnosis not present

## 2020-05-12 LAB — HM DIABETES EYE EXAM

## 2020-05-17 NOTE — Telephone Encounter (Signed)
traMADol (ULTRAM) 50 MG tablet  [id number] - [place], [place] - [address]. Phone:  [phone]  Fax:  [phone]

## 2020-05-18 ENCOUNTER — Encounter: Payer: Self-pay | Admitting: Family Medicine

## 2020-05-18 NOTE — Telephone Encounter (Signed)
Okay for refill?  

## 2020-05-26 ENCOUNTER — Other Ambulatory Visit: Payer: Self-pay | Admitting: Family Medicine

## 2020-06-25 ENCOUNTER — Other Ambulatory Visit: Payer: Self-pay | Admitting: Family Medicine

## 2020-08-02 ENCOUNTER — Other Ambulatory Visit: Payer: Self-pay | Admitting: Family Medicine

## 2020-08-02 DIAGNOSIS — M25562 Pain in left knee: Secondary | ICD-10-CM

## 2020-08-02 DIAGNOSIS — G8929 Other chronic pain: Secondary | ICD-10-CM

## 2020-08-03 NOTE — Telephone Encounter (Signed)
Pt LOV was on 04/08/2020 and last refill was done on 05/18/2020 for 60 with 1 refill, please advise

## 2020-08-05 ENCOUNTER — Encounter: Payer: Self-pay | Admitting: Family Medicine

## 2020-08-05 ENCOUNTER — Ambulatory Visit (INDEPENDENT_AMBULATORY_CARE_PROVIDER_SITE_OTHER): Payer: BC Managed Care – PPO | Admitting: Family Medicine

## 2020-08-05 ENCOUNTER — Other Ambulatory Visit: Payer: Self-pay

## 2020-08-05 VITALS — BP 134/78 | HR 93 | Temp 98.0°F | Ht 70.0 in | Wt 295.4 lb

## 2020-08-05 DIAGNOSIS — F1010 Alcohol abuse, uncomplicated: Secondary | ICD-10-CM | POA: Diagnosis not present

## 2020-08-05 DIAGNOSIS — R351 Nocturia: Secondary | ICD-10-CM | POA: Diagnosis not present

## 2020-08-05 DIAGNOSIS — E1151 Type 2 diabetes mellitus with diabetic peripheral angiopathy without gangrene: Secondary | ICD-10-CM

## 2020-08-05 DIAGNOSIS — M79609 Pain in unspecified limb: Secondary | ICD-10-CM

## 2020-08-05 DIAGNOSIS — M545 Low back pain, unspecified: Secondary | ICD-10-CM

## 2020-08-05 DIAGNOSIS — D529 Folate deficiency anemia, unspecified: Secondary | ICD-10-CM | POA: Diagnosis not present

## 2020-08-05 DIAGNOSIS — Z23 Encounter for immunization: Secondary | ICD-10-CM

## 2020-08-05 DIAGNOSIS — R12 Heartburn: Secondary | ICD-10-CM

## 2020-08-05 DIAGNOSIS — R202 Paresthesia of skin: Secondary | ICD-10-CM

## 2020-08-05 LAB — POCT URINALYSIS DIPSTICK
Bilirubin, UA: NEGATIVE
Blood, UA: NEGATIVE
Glucose, UA: NEGATIVE
Ketones, UA: NEGATIVE
Leukocytes, UA: NEGATIVE
Nitrite, UA: NEGATIVE
Protein, UA: POSITIVE — AB
Spec Grav, UA: 1.02 (ref 1.010–1.025)
Urobilinogen, UA: 0.2 E.U./dL
pH, UA: 6 (ref 5.0–8.0)

## 2020-08-05 NOTE — Patient Instructions (Addendum)
Paresthesia Paresthesia is a burning or prickling feeling. This feeling can happen in any part of the body. It often happens in the hands, arms, legs, or feet. Usually, it is not painful. In most cases, the feeling goes away in a short time and is not a sign of a serious problem. If you have paresthesia that lasts a long time, you may need to be seen by your doctor. Follow these instructions at home: Alcohol use   Do not drink alcohol if: ? Your doctor tells you not to drink. ? You are pregnant, may be pregnant, or are planning to become pregnant.  If you drink alcohol: ? Limit how much you use to:  0-1 drink a day for women.  0-2 drinks a day for men. ? Be aware of how much alcohol is in your drink. In the [place] one drink equals one 12 oz bottle of beer (355 mL), one 5 oz glass of wine (148 mL), or one 1 oz glass of hard liquor (44 mL). Nutrition   Eat a healthy diet. This includes: ? Eating foods that have a lot of fiber in them, such as fresh fruits and vegetables, whole grains, and beans. ? Limiting foods that have a lot of fat and processed sugars in them, such as fried or sweet foods. General instructions  Take over-the-counter and prescription medicines only as told by your doctor.  Do not use any products that have nicotine or tobacco in them, such as cigarettes and e-cigarettes. If you need help quitting, ask your doctor.  If you have diabetes, work with your doctor to make sure your blood sugar stays in a healthy range.  If your feet feel numb: ? Check for redness, warmth, and swelling every day. ? Wear padded socks and comfortable shoes. These help protect your feet.  Keep all follow-up visits as told by your doctor. This is important. Contact a doctor if:  You have paresthesia that gets worse or does not go away.  Your burning or prickling feeling gets worse when you walk.  You have pain or cramps.  You feel dizzy.  You have a rash. Get help right away if  you:  Feel weak.  Have trouble walking or moving.  Have problems speaking, understanding, or seeing.  Feel confused.  Cannot control when you pee (urinate) or poop (have a bowel movement).  Lose feeling (have numbness) after an injury.  Have new weakness in an arm or leg.  Pass out (faint). Summary  Paresthesia is a burning or prickling feeling. It often happens in the hands, arms, legs, or feet.  In most cases, the feeling goes away in a short time and is not a sign of a serious problem.  If you have paresthesia that lasts a long time, you may need to be seen by your doctor. This information is not intended to replace advice given to you by your health care provider. Make sure you discuss any questions you have with your health care provider. Document Revised: 11/18/2018 Document Reviewed: 11/01/2017 Elsevier Patient Education  [place].  Acute Back Pain, Adult Acute back pain is sudden and usually short-lived. It is often caused by an injury to the muscles and tissues in the back. The injury may result from:  A muscle or ligament getting overstretched or torn (strained). Ligaments are tissues that connect bones to each other. Lifting something improperly can cause a back strain.  Wear and tear (degeneration) of the spinal disks. Spinal disks are  circular tissue that provides cushioning between the bones of the spine (vertebrae).  Twisting motions, such as while playing sports or doing yard work.  A hit to the back.  Arthritis. You may have a physical exam, lab tests, and imaging tests to find the cause of your pain. Acute back pain usually goes away with rest and home care. Follow these instructions at home: Managing pain, stiffness, and swelling  Take over-the-counter and prescription medicines only as told by your health care provider.  Your health care provider may recommend applying ice during the first 24-48 hours after your pain starts. To do  this: ? Put ice in a plastic bag. ? Place a towel between your skin and the bag. ? Leave the ice on for 20 minutes, 2-3 times a day.  If directed, apply heat to the affected area as often as told by your health care provider. Use the heat source that your health care provider recommends, such as a moist heat pack or a heating pad. ? Place a towel between your skin and the heat source. ? Leave the heat on for 20-30 minutes. ? Remove the heat if your skin turns bright red. This is especially important if you are unable to feel pain, heat, or cold. You have a greater risk of getting burned. Activity   Do not stay in bed. Staying in bed for more than 1-2 days can delay your recovery.  Sit up and stand up straight. Avoid leaning forward when you sit, or hunching over when you stand. ? If you work at a desk, sit close to it so you do not need to lean over. Keep your chin tucked in. Keep your neck drawn back, and keep your elbows bent at a right angle. Your arms should look like the letter "L." ? Sit high and close to the steering wheel when you drive. Add lower back (lumbar) support to your car seat, if needed.  Take short walks on even surfaces as soon as you are able. Try to increase the length of time you walk each day.  Do not sit, drive, or stand in one place for more than 30 minutes at a time. Sitting or standing for long periods of time can put stress on your back.  Do not drive or use heavy machinery while taking prescription pain medicine.  Use proper lifting techniques. When you bend and lift, use positions that put less stress on your back: ? [place] your knees. ? Keep the load close to your body. ? Avoid twisting.  Exercise regularly as told by your health care provider. Exercising helps your back heal faster and helps prevent back injuries by keeping muscles strong and flexible.  Work with a physical therapist to make a safe exercise program, as recommended by your health care  provider. Do any exercises as told by your physical therapist. Lifestyle  Maintain a healthy weight. Extra weight puts stress on your back and makes it difficult to have good posture.  Avoid activities or situations that make you feel anxious or stressed. Stress and anxiety increase muscle tension and can make back pain worse. Learn ways to manage anxiety and stress, such as through exercise. General instructions  Sleep on a firm mattress in a comfortable position. Try lying on your side with your knees slightly bent. If you lie on your back, put a pillow under your knees.  Follow your treatment plan as told by your health care provider. This may include: ? Cognitive or  behavioral therapy. ? Acupuncture or massage therapy. ? Meditation or yoga. Contact a health care provider if:  You have pain that is not relieved with rest or medicine.  You have increasing pain going down into your legs or buttocks.  Your pain does not improve after 2 weeks.  You have pain at night.  You lose weight without trying.  You have a fever or chills. Get help right away if:  You develop new bowel or bladder control problems.  You have unusual weakness or numbness in your arms or legs.  You develop nausea or vomiting.  You develop abdominal pain.  You feel faint. Summary  Acute back pain is sudden and usually short-lived.  Use proper lifting techniques. When you bend and lift, use positions that put less stress on your back.  Take over-the-counter and prescription medicines and apply heat or ice as directed by your health care provider. This information is not intended to replace advice given to you by your health care provider. Make sure you discuss any questions you have with your health care provider. Document Revised: 02/11/2019 Document Reviewed: 06/06/2017 Elsevier Patient Education  2020 [employer].  Alcohol Withdrawal Syndrome When a person who drinks a lot of alcohol stops  drinking, he or she may have unpleasant and serious symptoms. These symptoms are called alcohol withdrawal syndrome. This condition may be mild or severe. It can be life-threatening. It can cause:  Shaking that you cannot control (tremor).  Sweating.  Headache.  Feeling fearful, upset, grouchy, or depressed.  Trouble sleeping (insomnia).  Nightmares.  Fast or uneven heartbeats (palpitations).  Alcohol cravings.  Feeling sick to your stomach (nausea).  Throwing up (vomiting).  Being bothered by light and sounds.  Confusion.  Trouble thinking clearly.  Not being hungry (loss of appetite).  Big changes in mood (mood swings). If you have all of the following symptoms at the same time, get help right away:  High blood pressure.  Fast heartbeat.  Trouble breathing.  Seizures.  Seeing, hearing, feeling, smelling, or tasting things that are not there (hallucinations). These symptoms are known as delirium tremens (DTs). They must be treated at the hospital right away. Follow these instructions at home:   Take over-the-counter and prescription medicines only as told by your doctor. This includes vitamins.  Do not drink alcohol.  Do not drive until your doctor says that this is safe for you.  Have someone stay with you or be available in case you need help. This should be someone you trust. This person can help you with your symptoms. He or she can also help you to not drink.  Drink enough fluid to keep your pee (urine) pale yellow.  Think about joining a support group or a treatment program to help you stop drinking.  Keep all follow-up visits as told by your doctor. This is important. Contact a doctor if:  Your symptoms get worse.  You cannot eat or drink without throwing up.  You have a hard time not drinking alcohol.  You cannot stop drinking alcohol. Get help right away if:  You have fast or uneven heartbeats.  You have chest pain.  You have trouble  breathing.  You have a seizure for the first time.  You see, hear, feel, smell, or taste something that is not there.  You get very confused. Summary  When a person who drinks a lot of alcohol stops drinking, he or she may have serious symptoms. This is called alcohol withdrawal  syndrome.  Delirium tremens (DTs) is a group of life-threatening symptoms. You should get help right away if you have these symptoms.  Think about joining an alcohol support group or a treatment program. This information is not intended to replace advice given to you by your health care provider. Make sure you discuss any questions you have with your health care provider. Document Revised: 10/05/2017 Document Reviewed: 06/29/2017 Elsevier Patient Education  2020 [place] for Gastroesophageal Reflux Disease, Adult When you have gastroesophageal reflux disease (GERD), the foods you eat and your eating habits are very important. Choosing the right foods can help ease your discomfort. Think about working with a nutrition specialist (dietitian) to help you make good choices. What are tips for following this plan?  Meals  Choose healthy foods that are low in fat, such as fruits, vegetables, whole grains, low-fat dairy products, and lean meat, fish, and poultry.  Eat small meals often instead of 3 large meals a day. Eat your meals slowly, and in a place where you are relaxed. Avoid bending over or lying down until 2-3 hours after eating.  Avoid eating meals 2-3 hours before bed.  Avoid drinking a lot of liquid with meals.  Cook foods using methods other than frying. Bake, grill, or broil food instead.  Avoid or limit: ? Chocolate. ? Peppermint or spearmint. ? Alcohol. ? Pepper. ? Black and decaffeinated coffee. ? Black and decaffeinated tea. ? Bubbly (carbonated) soft drinks. ? Caffeinated energy drinks and soft drinks.  Limit high-fat foods such as: ? Fatty meat or fried  foods. ? Whole milk, cream, butter, or ice cream. ? Nuts and nut butters. ? Pastries, donuts, and sweets made with butter or shortening.  Avoid foods that cause symptoms. These foods may be different for everyone. Common foods that cause symptoms include: ? Tomatoes. ? Oranges, lemons, and limes. ? Peppers. ? Spicy food. ? Onions and garlic. ? Vinegar. Lifestyle  Maintain a healthy weight. Ask your doctor what weight is healthy for you. If you need to lose weight, work with your doctor to do so safely.  Exercise for at least 30 minutes for 5 or more days each week, or as told by your doctor.  Wear loose-fitting clothes.  Do not smoke. If you need help quitting, ask your doctor.  Sleep with the head of your bed higher than your feet. Use a wedge under the mattress or blocks under the bed frame to raise the head of the bed. Summary  When you have gastroesophageal reflux disease (GERD), food and lifestyle choices are very important in easing your symptoms.  Eat small meals often instead of 3 large meals a day. Eat your meals slowly, and in a place where you are relaxed.  Limit high-fat foods such as fatty meat or fried foods.  Avoid bending over or lying down until 2-3 hours after eating.  Avoid peppermint and spearmint, caffeine, alcohol, and chocolate. This information is not intended to replace advice given to you by your health care provider. Make sure you discuss any questions you have with your health care provider. Document Revised: 02/13/2019 Document Reviewed: 11/28/2016 Elsevier Patient Education  [place].  Heartburn Heartburn is a type of pain or discomfort that can happen in the throat or chest. It is often described as a burning pain. It may also cause a bad, acid-like taste in the mouth. Heartburn may feel worse when you lie down or bend over. It may be worse at  night. It may be caused by stomach contents that move back up (reflux) into the tube that  connects the mouth with the stomach (esophagus). Follow these instructions at home: Eating and drinking   Avoid certain foods and drinks as told by your doctor. This may include: ? Coffee and tea (with or without caffeine). ? Drinks that have alcohol. ? Energy drinks and sports drinks. ? Carbonated drinks or sodas. ? Chocolate and cocoa. ? Peppermint and mint flavorings. ? Garlic and onions. ? Horseradish. ? Spicy and acidic foods, such as:  Peppers.  Chili powder and curry powder.  Vinegar.  Hot sauces and BBQ sauce. ? Citrus fruit juices and citrus fruits, such as:  Oranges.  Lemons.  Limes. ? Tomato-based foods, such as:  Red sauce and pizza with red sauce.  Chili.  Salsa. ? Fried and fatty foods, such as:  Donuts.  [place] fries and potato chips.  High-fat dressings. ? High-fat meats, such as:  Hot dogs and sausage.  Rib eye steak.  Ham and bacon. ? High-fat dairy items, such as:  Whole milk.  Butter.  Cream cheese.  Eat small meals often. Avoid eating large meals.  Avoid drinking large amounts of liquid with your meals.  Avoid eating meals during the 2-3 hours before bedtime.  Avoid lying down right after you eat.  Do not exercise right after you eat. Lifestyle      If you are overweight, lose an amount of weight that is healthy for you. Ask your doctor about a safe weight loss goal.  Do not use any products that contain nicotine or tobacco, including cigarettes, e-cigarettes, and chewing tobacco. These can make your symptoms worse. If you need help quitting, ask your doctor.  Wear loose clothes. Do not wear anything tight around your waist.  Raise (elevate) the head of your bed about 6 inches (15 cm) when you sleep.  Try to lower your stress. If you need help doing this, ask your doctor. General instructions  Pay attention to any changes in your symptoms.  Take over-the-counter and prescription medicines only as told by your  doctor. ? Do not take aspirin, ibuprofen, or other NSAIDs unless your doctor says it is okay. ? Stop medicines only as told by your doctor.  Keep all follow-up visits as told by your doctor. This is important. Contact a doctor if:  You have new symptoms.  You lose weight and you do not know why it is happening.  You have trouble swallowing, or it hurts to swallow.  You have wheezing or a cough that keeps happening.  Your symptoms do not get better with treatment.  You have heartburn often for more than 2 weeks. Get help right away if:  You have pain in your arms, neck, jaw, teeth, or back.  You feel sweaty, dizzy, or light-headed.  You have chest pain or shortness of breath.  You throw up (vomit) and your throw up looks like blood or coffee grounds.  Your poop (stool) is bloody or black. These symptoms may represent a serious problem that is an emergency. Do not wait to see if the symptoms will go away. Get medical help right away. Call your local emergency services (911 in the [place] Do not drive yourself to the hospital. Summary  Heartburn is a type of pain that can happen in the throat or chest. It can feel like a burning pain. It may also cause a bad, acid-like taste in the mouth.  You may need  to avoid certain foods and drinks to help your symptoms. Ask your doctor what foods and drinks you should avoid.  Take over-the-counter and prescription medicines only as told by your doctor. Do not take aspirin, ibuprofen, or other NSAIDs unless your doctor told you to do so.  Contact your doctor if your symptoms do not get better or they get worse. This information is not intended to replace advice given to you by your health care provider. Make sure you discuss any questions you have with your health care provider. Document Revised: 03/25/2018 Document Reviewed: 03/25/2018 Elsevier Patient Education  [place].  Heartburn Heartburn is a type of pain or discomfort  that can happen in the throat or chest. It is often described as a burning pain. It may also cause a bad, acid-like taste in the mouth. Heartburn may feel worse when you lie down or bend over. It may be worse at night. It may be caused by stomach contents that move back up (reflux) into the tube that connects the mouth with the stomach (esophagus). Follow these instructions at home: Eating and drinking   Avoid certain foods and drinks as told by your doctor. This may include: ? Coffee and tea (with or without caffeine). ? Drinks that have alcohol. ? Energy drinks and sports drinks. ? Carbonated drinks or sodas. ? Chocolate and cocoa. ? Peppermint and mint flavorings. ? Garlic and onions. ? Horseradish. ? Spicy and acidic foods, such as:  Peppers.  Chili powder and curry powder.  Vinegar.  Hot sauces and BBQ sauce. ? Citrus fruit juices and citrus fruits, such as:  Oranges.  Lemons.  Limes. ? Tomato-based foods, such as:  Red sauce and pizza with red sauce.  Chili.  Salsa. ? Fried and fatty foods, such as:  Donuts.  [place] fries and potato chips.  High-fat dressings. ? High-fat meats, such as:  Hot dogs and sausage.  Rib eye steak.  Ham and bacon. ? High-fat dairy items, such as:  Whole milk.  Butter.  Cream cheese.  Eat small meals often. Avoid eating large meals.  Avoid drinking large amounts of liquid with your meals.  Avoid eating meals during the 2-3 hours before bedtime.  Avoid lying down right after you eat.  Do not exercise right after you eat. Lifestyle      If you are overweight, lose an amount of weight that is healthy for you. Ask your doctor about a safe weight loss goal.  Do not use any products that contain nicotine or tobacco, including cigarettes, e-cigarettes, and chewing tobacco. These can make your symptoms worse. If you need help quitting, ask your doctor.  Wear loose clothes. Do not wear anything tight around your  waist.  Raise (elevate) the head of your bed about 6 inches (15 cm) when you sleep.  Try to lower your stress. If you need help doing this, ask your doctor. General instructions  Pay attention to any changes in your symptoms.  Take over-the-counter and prescription medicines only as told by your doctor. ? Do not take aspirin, ibuprofen, or other NSAIDs unless your doctor says it is okay. ? Stop medicines only as told by your doctor.  Keep all follow-up visits as told by your doctor. This is important. Contact a doctor if:  You have new symptoms.  You lose weight and you do not know why it is happening.  You have trouble swallowing, or it hurts to swallow.  You have wheezing or a cough that  keeps happening.  Your symptoms do not get better with treatment.  You have heartburn often for more than 2 weeks. Get help right away if:  You have pain in your arms, neck, jaw, teeth, or back.  You feel sweaty, dizzy, or light-headed.  You have chest pain or shortness of breath.  You throw up (vomit) and your throw up looks like blood or coffee grounds.  Your poop (stool) is bloody or black. These symptoms may represent a serious problem that is an emergency. Do not wait to see if the symptoms will go away. Get medical help right away. Call your local emergency services (911 in the [place] Do not drive yourself to the hospital. Summary  Heartburn is a type of pain that can happen in the throat or chest. It can feel like a burning pain. It may also cause a bad, acid-like taste in the mouth.  You may need to avoid certain foods and drinks to help your symptoms. Ask your doctor what foods and drinks you should avoid.  Take over-the-counter and prescription medicines only as told by your doctor. Do not take aspirin, ibuprofen, or other NSAIDs unless your doctor told you to do so.  Contact your doctor if your symptoms do not get better or they get worse. This information is not intended  to replace advice given to you by your health care provider. Make sure you discuss any questions you have with your health care provider. Document Revised: 03/25/2018 Document Reviewed: 03/25/2018 Elsevier Patient Education  [place].

## 2020-08-05 NOTE — Progress Notes (Signed)
Subjective:    Patient ID: [name], male    DOB: 1946/06/20, 74 y.o.   MRN: [id number]  No chief complaint on file.   HPI Pt is a 74 yo male with pmh sig for HTN, CAD s/p CABG, DM II, PVD, BPH, gout who was seen today for ongoing concerns.  Pt endorses R sided low back pain/R upper hip pain at night when rolling over in bed.  Pain will move into groin.  Denies injury or pain radiating into legs.  Pt unable to sleep at night 2/2 nocturia.  Getting up every hr.  Pt denies urinary frequency during the day.  Drinking at least 6 pk of beer nightly.  Denies needing an eye opener, getting annoyed, or feeling guilty.  Pt endorses tingling in RUE in am and increased heartburn.  Not taking anything for heartburn.  Pt unable to get an erection.  Interested in [place].  Past Medical History:  Diagnosis Date  . CAD, ARTERY BYPASS GRAFT 02/10/2008  . CAROTID ARTERY STENOSIS, WITHOUT INFARCTION 09/22/2008  . CORONARY ARTERY DISEASE 04/03/2008  . Diabetes mellitus    "borderline"  . ERECTILE DYSFUNCTION 01/14/2009  . GOUT 01/16/2008  . HERPES ZOSTER 04/15/2010  . HYPERLIPIDEMIA 01/16/2008  . HYPERTENSION 01/16/2008  . IMPAIRED GLUCOSE TOLERANCE 08/22/2010  . Shingles 2011   left chest  . Ulcer 30 yrs ago   stomach    No Known Allergies  ROS General: Denies fever, chills, night sweats, changes in weight, changes in appetite HEENT: Denies headaches, ear pain, changes in vision, rhinorrhea, sore throat CV: Denies CP, palpitations, SOB, orthopnea Pulm: Denies SOB, cough, wheezing GI: Denies abdominal pain, nausea, vomiting, diarrhea, constipation  +heartburn GU: Denies dysuria, hematuria, frequency  +nocturia Msk: Denies muscle cramps, joint pains +R back/hip pain.  H/o hand deformity 2/2 falling on stove as a child. Neuro: Denies weakness, numbness  +RUE tingling Skin: Denies rashes, bruising Psych: Denies depression, anxiety, hallucinations    Objective:    Blood pressure 134/78, pulse 93,  temperature 98 F (36.7 C), temperature source Oral, height '5\' 10"'  (1.778 m), weight 295 lb 6.4 oz (134 kg), SpO2 99 %.   Gen. Pleasant, well-nourished, in no distress, normal affect  HEENT: [place]/AT, face symmetric, conjunctiva clear, no scleral icterus, PERRLA, EOMI, nares patent without drainage Lungs: no accessory muscle use, CTAB, no wheezes or rales Cardiovascular: RRR, no m/r/g, no peripheral edema Musculoskeletal: TTP of lumbar paraspinal muscles.  Negative log roll, straight leg raise, FADIR, or FABER b/l.  Mild contracture of L 5th digit. No cyanosis or clubbing, normal tone Neuro:  A&Ox3, CN II-XII intact, normal gait.  No tremor Skin:  Warm, no lesions/ rash.  No spider telangectasias.   Wt Readings from Last 3 Encounters:  08/05/20 295 lb 6.4 oz (134 kg)  04/08/20 (!) 303 lb (137.4 kg)  05/21/19 279 lb (126.6 kg)    Lab Results  Component Value Date   WBC 5.7 04/08/2020   HGB 11.9 (L) 04/08/2020   HCT 35.7 (L) 04/08/2020   PLT 198.0 04/08/2020   GLUCOSE 154 (H) 04/08/2020   CHOL 164 04/08/2020   TRIG 58.0 04/08/2020   HDL 67.10 04/08/2020   LDLDIRECT 130.4 11/06/2011   LDLCALC 86 04/08/2020   ALT 17 04/08/2020   AST 19 04/08/2020   NA 137 04/08/2020   K 4.9 04/08/2020   CL 106 04/08/2020   CREATININE 1.11 04/08/2020   BUN 23 04/08/2020   CO2 26 04/08/2020   TSH 2.31 07/21/2016  PSA 0.17 04/08/2020   INR 1.5 02/10/2008   HGBA1C 6.9 (H) 04/08/2020   MICROALBUR 18.3 07/21/2016    Assessment/Plan:  Acute right-sided low back pain without sciatica  -More likely muscle strain as exam negative for hip pathology.  Also consider DDD -Discussed supportive care including NSAIDs, heat, massage, ice, stretching, topical analgesic rubs -For continued or worsening symptoms consider imaging of lumbar spine and PT - Plan: CMP with eGFR([facility]), CBC with Differential/Platelet  Need for immunization against influenza  - Plan: Flu Vaccine QUAD High  Dose(Fluad)  Nocturia -Likely due to diuretic effect caused by evening alcohol intake.  Also consider hyperglycemia or BPH though pt taking Flomax daily.  -Discussed cutting down on alcohol intake -We will obtain labs including PSA  - Plan: PSA, Hemoglobin A1c, POCT urinalysis dipstick  Type 2 diabetes mellitus with peripheral vascular disease (HCC) -Stable -Hemoglobin A1c 6.9% on 04/25/2020 -Continue current medications including glipizide XL 2.5 mg, Metformin 1000 mg twice daily.  Will contact pharmacy to confirm and update med list. -Continue ACE inhibitor and statin - Plan: Hemoglobin A1c, CMP with eGFR([facility]), Microalbumin/Creatinine Ratio, Urine  Paresthesia and pain of right extremity  -discussed likely causes including EtOH abuse causing vitamin def., nerve compression - Plan: CMP with eGFR([facility]), Vitamin B12, Folate, CBC with Differential/Platelet  ETOH abuse  -currently drinking a 6 pack of beer or more per night -advised to cut down, but not to quit drinking abruptly. -given handout - Plan: CMP with eGFR([facility]), Vitamin B12, Folate  Heartburn -Discussed avoiding foods known to cause problems -Given intermittent symptoms discussed using OTC Tums as needed -If symptoms are more frequently consider PPI -Given handout  F/u prn   Grier Mitts, MD

## 2020-08-06 LAB — CBC WITH DIFFERENTIAL/PLATELET
Absolute Monocytes: 654 cells/uL (ref 200–950)
Basophils Absolute: 42 cells/uL (ref 0–200)
Basophils Relative: 0.7 %
Eosinophils Absolute: 180 cells/uL (ref 15–500)
Eosinophils Relative: 3 %
HCT: 37.3 % — ABNORMAL LOW (ref 38.5–50.0)
Hemoglobin: 12.1 g/dL — ABNORMAL LOW (ref 13.2–17.1)
Lymphs Abs: 1944 cells/uL (ref 850–3900)
MCH: 31.6 pg (ref 27.0–33.0)
MCHC: 32.4 g/dL (ref 32.0–36.0)
MCV: 97.4 fL (ref 80.0–100.0)
MPV: 10.5 fL (ref 7.5–12.5)
Monocytes Relative: 10.9 %
Neutro Abs: 3180 cells/uL (ref 1500–7800)
Neutrophils Relative %: 53 %
Platelets: 227 10*3/uL (ref 140–400)
RBC: 3.83 10*6/uL — ABNORMAL LOW (ref 4.20–5.80)
RDW: 13.4 % (ref 11.0–15.0)
Total Lymphocyte: 32.4 %
WBC: 6 10*3/uL (ref 3.8–10.8)

## 2020-08-06 LAB — COMPLETE METABOLIC PANEL WITH GFR
AG Ratio: 1 (calc) (ref 1.0–2.5)
ALT: 29 U/L (ref 9–46)
AST: 30 U/L (ref 10–35)
Albumin: 3.5 g/dL — ABNORMAL LOW (ref 3.6–5.1)
Alkaline phosphatase (APISO): 86 U/L (ref 35–144)
BUN/Creatinine Ratio: 20 (calc) (ref 6–22)
BUN: 27 mg/dL — ABNORMAL HIGH (ref 7–25)
CO2: 22 mmol/L (ref 20–32)
Calcium: 9.6 mg/dL (ref 8.6–10.3)
Chloride: 108 mmol/L (ref 98–110)
Creat: 1.32 mg/dL — ABNORMAL HIGH (ref 0.70–1.18)
GFR, Est African American: 62 mL/min/{1.73_m2} (ref >=60)
GFR, Est Non African American: 54 mL/min/{1.73_m2} — ABNORMAL LOW (ref >=60)
Globulin: 3.5 g/dL (calc) (ref 1.9–3.7)
Glucose, Bld: 110 mg/dL — ABNORMAL HIGH (ref 65–99)
Potassium: 5 mmol/L (ref 3.5–5.3)
Sodium: 139 mmol/L (ref 135–146)
Total Bilirubin: 0.5 mg/dL (ref 0.2–1.2)
Total Protein: 7 g/dL (ref 6.1–8.1)

## 2020-08-06 LAB — FOLATE: Folate: 11 ng/mL

## 2020-08-06 LAB — PSA: PSA: 0.17 ng/mL (ref <=4.0)

## 2020-08-06 LAB — HEMOGLOBIN A1C
Hgb A1c MFr Bld: 6.5 % of total Hgb — ABNORMAL HIGH (ref <5.7)
Mean Plasma Glucose: 140 (calc)
eAG (mmol/L): 7.7 (calc)

## 2020-08-06 LAB — MICROALBUMIN / CREATININE URINE RATIO
Creatinine, Urine: 89 mg/dL (ref 20–320)
Microalb Creat Ratio: 294 mcg/mg creat — ABNORMAL HIGH (ref <30)
Microalb, Ur: 26.2 mg/dL

## 2020-08-06 LAB — VITAMIN B12: Vitamin B-12: 335 pg/mL (ref 200–1100)

## 2020-08-10 DIAGNOSIS — R12 Heartburn: Secondary | ICD-10-CM | POA: Insufficient documentation

## 2020-08-10 DIAGNOSIS — R351 Nocturia: Secondary | ICD-10-CM | POA: Insufficient documentation

## 2020-08-10 DIAGNOSIS — F1010 Alcohol abuse, uncomplicated: Secondary | ICD-10-CM | POA: Insufficient documentation

## 2020-08-11 ENCOUNTER — Telehealth: Payer: Self-pay | Admitting: Family Medicine

## 2020-08-11 ENCOUNTER — Other Ambulatory Visit: Payer: Self-pay

## 2020-08-11 ENCOUNTER — Telehealth: Payer: Self-pay

## 2020-08-11 DIAGNOSIS — E1121 Type 2 diabetes mellitus with diabetic nephropathy: Secondary | ICD-10-CM

## 2020-08-11 NOTE — Telephone Encounter (Signed)
Message routed to Dr Volanda Napoleon

## 2020-08-11 NOTE — Telephone Encounter (Signed)
Spoke with pt pharmacy state that he is only taking Tamsulosin.Metoprolol, Atorvastatin, Tramadol and Colchicine. Pt pharmacy state that they don't have any more medications on file for pt. Spoke with pt state that he had a lot of refills of his diabetes mediations with [facility] which was previously sent by Dr Burnice Logan, pt also state that he was not taking Metformin BID but once a day. Advised pt to make sure he takes his medications as directed and  that refills for his diabetes medications and amlodipine will be sent to his pharmacy for refill. Pt verbalized understanding

## 2020-08-11 NOTE — Telephone Encounter (Signed)
Pt aware of lab results/he is scheduled for micro/creat on 13th and future order placed/also scheduled 3 nurse visits for B12 injections per PCP/thx dmf

## 2020-08-11 NOTE — Telephone Encounter (Signed)
-----   Message from [name], MD sent at 08/10/2020  3:41 PM EDT ----- Albumin creatinine ratio elevated. Vitamin B12 low normal.  Would recommend monthly B12 injection x3 months. Creatinine mildly elevated at 1.32.  Make sure you are drinking plenty of water daily.  We can recheck this number in the next week. Hemoglobin slightly low at 12.1. Hemoglobin A1c 6.5% which is down from 6.9%.

## 2020-08-12 ENCOUNTER — Other Ambulatory Visit: Payer: Self-pay

## 2020-08-12 MED ORDER — METFORMIN HCL 1000 MG PO TABS: 1000.0000 mg | ORAL_TABLET | Freq: Two times a day (BID) | ORAL | 3 refills | Status: DC

## 2020-08-12 MED ORDER — GLIPIZIDE ER 2.5 MG PO TB24: 2.5000 mg | ORAL_TABLET | Freq: Every evening | ORAL | 5 refills | Status: DC

## 2020-08-18 ENCOUNTER — Other Ambulatory Visit: Payer: BC Managed Care – PPO

## 2020-08-18 ENCOUNTER — Other Ambulatory Visit: Payer: Self-pay

## 2020-08-18 DIAGNOSIS — E1121 Type 2 diabetes mellitus with diabetic nephropathy: Secondary | ICD-10-CM | POA: Diagnosis not present

## 2020-08-19 LAB — MICROALBUMIN / CREATININE URINE RATIO
Creatinine, Urine: 125 mg/dL (ref 20–320)
Microalb Creat Ratio: 607 mcg/mg creat — ABNORMAL HIGH (ref <30)
Microalb, Ur: 75.9 mg/dL

## 2020-08-20 ENCOUNTER — Other Ambulatory Visit: Payer: Self-pay

## 2020-08-20 DIAGNOSIS — I10 Essential (primary) hypertension: Secondary | ICD-10-CM

## 2020-08-20 MED ORDER — LISINOPRIL 20 MG PO TABS: 20.0000 mg | ORAL_TABLET | Freq: Every day | ORAL | 3 refills | Status: DC

## 2020-08-20 NOTE — Progress Notes (Signed)
Pt Rx faxed to pharmacy due to technical issues with e scribing

## 2020-08-27 ENCOUNTER — Ambulatory Visit: Payer: BC Managed Care – PPO

## 2020-08-27 ENCOUNTER — Other Ambulatory Visit: Payer: Self-pay

## 2020-08-27 ENCOUNTER — Ambulatory Visit (INDEPENDENT_AMBULATORY_CARE_PROVIDER_SITE_OTHER): Payer: BC Managed Care – PPO

## 2020-08-27 DIAGNOSIS — E538 Deficiency of other specified B group vitamins: Secondary | ICD-10-CM

## 2020-08-27 MED ORDER — CYANOCOBALAMIN 1000 MCG/ML IJ SOLN
1000.0000 ug | Freq: Once | INTRAMUSCULAR | Status: AC
  Administered 2020-08-27: 1000 ug via INTRAMUSCULAR

## 2020-08-27 NOTE — Progress Notes (Signed)
Per orders of Dr. Volanda Napoleon, injection of Cyanocobalamin 1,000 mg/mL given by Wyvonne Lenz. Patient tolerated injection well.

## 2020-09-06 ENCOUNTER — Encounter: Payer: Self-pay | Admitting: Family Medicine

## 2020-09-06 ENCOUNTER — Other Ambulatory Visit: Payer: Self-pay | Admitting: Family Medicine

## 2020-09-06 ENCOUNTER — Ambulatory Visit (INDEPENDENT_AMBULATORY_CARE_PROVIDER_SITE_OTHER): Payer: BC Managed Care – PPO

## 2020-09-06 ENCOUNTER — Other Ambulatory Visit: Payer: Self-pay

## 2020-09-06 ENCOUNTER — Ambulatory Visit (INDEPENDENT_AMBULATORY_CARE_PROVIDER_SITE_OTHER): Payer: BC Managed Care – PPO | Admitting: Family Medicine

## 2020-09-06 VITALS — BP 132/74 | HR 86 | Temp 97.9°F | Wt 289.0 lb

## 2020-09-06 DIAGNOSIS — I25810 Atherosclerosis of coronary artery bypass graft(s) without angina pectoris: Secondary | ICD-10-CM | POA: Diagnosis not present

## 2020-09-06 DIAGNOSIS — M5441 Lumbago with sciatica, right side: Secondary | ICD-10-CM

## 2020-09-06 DIAGNOSIS — E1151 Type 2 diabetes mellitus with diabetic peripheral angiopathy without gangrene: Secondary | ICD-10-CM | POA: Diagnosis not present

## 2020-09-06 DIAGNOSIS — M545 Low back pain, unspecified: Secondary | ICD-10-CM | POA: Diagnosis not present

## 2020-09-06 DIAGNOSIS — I10 Essential (primary) hypertension: Secondary | ICD-10-CM

## 2020-09-06 LAB — POCT URINALYSIS DIPSTICK
Blood, UA: NEGATIVE
Glucose, UA: NEGATIVE
Ketones, UA: NEGATIVE
Leukocytes, UA: NEGATIVE
Nitrite, UA: NEGATIVE
Protein, UA: POSITIVE — AB
Spec Grav, UA: 1.025 (ref 1.010–1.025)
Urobilinogen, UA: 0.2 E.U./dL
pH, UA: 6 (ref 5.0–8.0)

## 2020-09-06 MED ORDER — NITROGLYCERIN 0.4 MG SL SUBL: 0.4000 mg | SUBLINGUAL_TABLET | SUBLINGUAL | 5 refills | Status: AC | PRN

## 2020-09-06 MED ORDER — GABAPENTIN 100 MG PO CAPS: 100.0000 mg | ORAL_CAPSULE | Freq: Every day | ORAL | 0 refills | Status: DC

## 2020-09-06 MED ORDER — METFORMIN HCL ER (MOD) 1000 MG PO TB24: 1000.0000 mg | ORAL_TABLET | Freq: Every evening | ORAL | 1 refills | Status: DC

## 2020-09-06 NOTE — Patient Instructions (Addendum)
Herniated Disk  A herniated disk, also called a ruptured disk or slipped disk, occurs when a disk in the spine bulges out too far. Between the bones in the spine (vertebrae), there are oval disks that are made of a soft, spongy center filled with liquid that is surrounded by a tough outer ring. The disks connect the vertebrae, help the spine move, and keep the bones from rubbing against each other when you move. When you have a herniated disk, the spongy center of the disk bulges out or breaks through the outer ring. The spongy center can press on a nerve between the vertebrae and cause pain. This can occur anywhere in the back or neck area, but the lower back is most commonly affected. What are the causes? This condition may be caused by:  Age-related wear and tear. The spongy centers of spinal disks tend to shrink and dry out with age, which makes them more likely to herniate.  Sudden injury, such as a strain or sprain. What increases the risk? The following factors may make you more likely to develop this condition:  Aging. This is the main risk factor for a herniated disk.  Being a man who is 34-9 years old.  Frequently doing activities that involve heavy lifting, bending, or twisting.  Not getting enough exercise.  Being overweight.  Using tobacco products. What are the signs or symptoms? Symptoms may vary depending on where your herniated disk is located.  A herniated disk in the lower back may cause sharp pain in: ? Part of the arm, leg, hip, or buttocks. ? The back of the lower leg (calf). ? The lower back, spreading down through the leg into the foot (sciatica).  A herniated disk in the neck may cause dizziness and vertigo. It may also cause pain or weakness in: ? The neck. ? The shoulder blades. ? The upper arm, forearm, or fingers. You may also have muscle weakness. It may be difficult to:  Lift your leg or arm.  Stand on your toes.  Squeeze tightly with one of  your hands. Other symptoms may include:  Numbness or tingling in the affected areas of the hands, arms, feet, or legs.  Inability to control when you urinate or when you have bowel movements. This is a rare but serious sign of a severe herniated disk in the lower back. How is this diagnosed? This condition may be diagnosed based on:  Your symptoms.  Your medical history.  A physical exam. The exam may include: ? A straight-leg test. For this test, you will lie on your back while your health care provider lifts your leg, keeping your knee straight. If you feel pain, you likely have a herniated disk. ? Neurologic tests. These include checking for numbness, reflexes, muscle strength, and problems with posture.  Imaging tests, such as: ? X-rays. ? MRI. ? CT scan. ? Electromyogram (EMG) to check the nerves that control muscles. This test may be used to determine which nerves are affected by your herniated disk. How is this treated? Treatment for this condition may include:  A short period of rest. This is usually the first treatment. ? You may be on bed rest for up to 2 days, or you may be instructed to stay home and avoid physical activity. ? If you have a herniated disk in your lower back, avoid sitting as much as possible. Sitting increases pressure on the disk.  Medicines. These may include: ? NSAIDs, such as ibuprofen, to help  reduce pain and swelling. ? Muscle relaxants to prevent sudden tightening of the back muscles (back spasms). ? Prescription pain medicines, if you have severe pain.  Ice or heat therapy.  Steroid injections in the area of the herniated disk. These can help reduce pain and swelling.  Physical therapy to strengthen your back muscles. In many cases, symptoms go away with treatment over a period of days or weeks. You will most likely be free of symptoms after 3-4 months. If other treatments do not help to relieve your symptoms, you may need surgery. Follow  these instructions at home: Medicines  Take over-the-counter and prescription medicines only as told by your health care provider.  Ask your health care provider if the medicine prescribed to you: ? Requires you to avoid driving or using heavy machinery. ? Can cause constipation. You may need to take these actions to prevent or treat constipation:  Drink enough fluid to keep your urine pale yellow.  Take over-the-counter or prescription medicines.  Eat foods that are high in fiber, such as beans, whole grains, and fresh fruits and vegetables.  Limit foods that are high in fat and processed sugars, such as fried or sweet foods. Managing pain, stiffness, and swelling      If directed, put ice on the painful area. Icing can help to relieve pain. To do this: ? Put ice in a plastic bag. ? Place a towel between your skin and the bag. ? Leave the ice on for 20 minutes, 2-3 times a day.  If directed, apply heat to the painful area as often as told by your health care provider. Heat can reduce the stiffness of your muscles. Use the heat source that your health care provider recommends, such as a moist heat pack or a heating pad. ? Place a towel between your skin and the heat source. ? Leave the heat on for 20-30 minutes. ? Remove the heat if your skin turns bright red. This is especially important if you are unable to feel pain, heat, or cold. You may have a greater risk of getting burned. Activity  Rest as told by your health care provider.  After your rest period: ? Return to your normal activities and gradually begin exercising as told by your health care provider. Ask your health care provider what activities and exercises are safe for you. ? Use good posture. ? Avoid movements that cause pain. ? Do not lift anything that is heavier than 10 lb (4.5 kg), or the limit that you are told, until your health care provider says that it is safe. ? Do not sit or stand for long periods of  time without changing positions. ? Do not sit for long periods of time without getting up and moving around.  If physical therapy was prescribed, do exercises as told by your health care provider.  Aim to strengthen muscles in your back and abdomen with exercises such as swimming or walking. General instructions  Do not use any products that contain nicotine or tobacco, such as cigarettes, e-cigarettes, and chewing tobacco. These products can delay healing. If you need help quitting, ask your health care provider.  Do not wear high-heeled shoes.  Do not sleep on your abdomen.  If you are overweight, work with your health care provider to lose weight safely.  Keep all follow-up visits as told by your health care provider. This is important. How is this prevented?   Maintain a healthy weight.  Maintain physical fitness. Do  at least 150 minutes of moderate-intensity exercise each week, such as brisk walking or water aerobics.  When lifting objects: ? Keep your feet at least shoulder-width apart and tighten the muscles of your abdomen. ? Keep your spine neutral as you bend your knees and hips. It is important to lift using the strength of your legs, not your back. Do not lock your knees straight out. ? Always ask for help to lift heavy or awkward objects. Contact a health care provider if you:  Have back pain or neck pain that does not get better after 6 weeks.  Have severe pain in your back, neck, legs, or arms.  Develop numbness, tingling, or weakness anywhere in your body. Get help right away if:  You cannot move your arms or legs.  You cannot control when you urinate or have bowel movements.  You feel dizzy or you faint.  You have shortness of breath. Summary  A herniated disk, also called a ruptured disk or slipped disk, occurs when a disk in the spine bulges out too far (herniates).  This condition may be caused by age-related wear and tear or a sudden  injury.  Symptoms may vary depending on where your herniated disk is located.  Treatment may include rest, medicines, ice or heat therapy, steroid injections, and physical therapy.  If other treatments do not help to relieve your symptoms, you may need surgery. This information is not intended to replace advice given to you by your health care provider. Make sure you discuss any questions you have with your health care provider. Document Revised: 05/27/2019 Document Reviewed: 05/27/2019 Elsevier Patient Education  [place].  Diabetes Mellitus and Nutrition, Adult When you have diabetes (diabetes mellitus), it is very important to have healthy eating habits because your blood sugar (glucose) levels are greatly affected by what you eat and drink. Eating healthy foods in the appropriate amounts, at about the same times every day, can help you:  Control your blood glucose.  Lower your risk of heart disease.  Improve your blood pressure.  Reach or maintain a healthy weight. Every person with diabetes is different, and each person has different needs for a meal plan. Your health care provider may recommend that you work with a diet and nutrition specialist (dietitian) to make a meal plan that is best for you. Your meal plan may vary depending on factors such as:  The calories you need.  The medicines you take.  Your weight.  Your blood glucose, blood pressure, and cholesterol levels.  Your activity level.  Other health conditions you have, such as heart or kidney disease. How do carbohydrates affect me? Carbohydrates, also called carbs, affect your blood glucose level more than any other type of food. Eating carbs naturally raises the amount of glucose in your blood. Carb counting is a method for keeping track of how many carbs you eat. Counting carbs is important to keep your blood glucose at a healthy level, especially if you use insulin or take certain oral diabetes  medicines. It is important to know how many carbs you can safely have in each meal. This is different for every person. Your dietitian can help you calculate how many carbs you should have at each meal and for each snack. Foods that contain carbs include:  Bread, cereal, rice, pasta, and crackers.  Potatoes and corn.  Peas, beans, and lentils.  Milk and yogurt.  Fruit and juice.  Desserts, such as cakes, cookies, ice cream, and  candy. How does alcohol affect me? Alcohol can cause a sudden decrease in blood glucose (hypoglycemia), especially if you use insulin or take certain oral diabetes medicines. Hypoglycemia can be a life-threatening condition. Symptoms of hypoglycemia (sleepiness, dizziness, and confusion) are similar to symptoms of having too much alcohol. If your health care provider says that alcohol is safe for you, follow these guidelines:  Limit alcohol intake to no more than 1 drink per day for nonpregnant women and 2 drinks per day for men. One drink equals 12 oz of beer, 5 oz of wine, or 1 oz of hard liquor.  Do not drink on an empty stomach.  Keep yourself hydrated with water, diet soda, or unsweetened iced tea.  Keep in mind that regular soda, juice, and other mixers may contain a lot of sugar and must be counted as carbs. What are tips for following this plan?  Reading food labels  Start by checking the serving size on the "Nutrition Facts" label of packaged foods and drinks. The amount of calories, carbs, fats, and other nutrients listed on the label is based on one serving of the item. Many items contain more than one serving per package.  Check the total grams (g) of carbs in one serving. You can calculate the number of servings of carbs in one serving by dividing the total carbs by 15. For example, if a food has 30 g of total carbs, it would be equal to 2 servings of carbs.  Check the number of grams (g) of saturated and trans fats in one serving. Choose foods  that have low or no amount of these fats.  Check the number of milligrams (mg) of salt (sodium) in one serving. Most people should limit total sodium intake to less than 2,300 mg per day.  Always check the nutrition information of foods labeled as "low-fat" or "nonfat". These foods may be higher in added sugar or refined carbs and should be avoided.  Talk to your dietitian to identify your daily goals for nutrients listed on the label. Shopping  Avoid buying canned, premade, or processed foods. These foods tend to be high in fat, sodium, and added sugar.  Shop around the outside edge of the grocery store. This includes fresh fruits and vegetables, bulk grains, fresh meats, and fresh dairy. Cooking  Use low-heat cooking methods, such as baking, instead of high-heat cooking methods like deep frying.  Cook using healthy oils, such as olive, canola, or sunflower oil.  Avoid cooking with butter, cream, or high-fat meats. Meal planning  Eat meals and snacks regularly, preferably at the same times every day. Avoid going long periods of time without eating.  Eat foods high in fiber, such as fresh fruits, vegetables, beans, and whole grains. Talk to your dietitian about how many servings of carbs you can eat at each meal.  Eat 4-6 ounces (oz) of lean protein each day, such as lean meat, chicken, fish, eggs, or tofu. One oz of lean protein is equal to: ? 1 oz of meat, chicken, or fish. ? 1 egg. ?  cup of tofu.  Eat some foods each day that contain healthy fats, such as avocado, nuts, seeds, and fish. Lifestyle  Check your blood glucose regularly.  Exercise regularly as told by your health care provider. This may include: ? 150 minutes of moderate-intensity or vigorous-intensity exercise each week. This could be brisk walking, biking, or water aerobics. ? Stretching and doing strength exercises, such as yoga or weightlifting, at least  2 times a week.  Take medicines as told by your  health care provider.  Do not use any products that contain nicotine or tobacco, such as cigarettes and e-cigarettes. If you need help quitting, ask your health care provider.  Work with a Social worker or diabetes educator to identify strategies to manage stress and any emotional and social challenges. Questions to ask a health care provider  Do I need to meet with a diabetes educator?  Do I need to meet with a dietitian?  What number can I call if I have questions?  When are the best times to check my blood glucose? Where to find more information:  American Diabetes Association: diabetes.org  Academy of Nutrition and Dietetics: www.eatright.[employer] of Diabetes and Digestive and Kidney Diseases (NIH): [employer] Summary  A healthy meal plan will help you control your blood glucose and maintain a healthy lifestyle.  Working with a diet and nutrition specialist (dietitian) can help you make a meal plan that is best for you.  Keep in mind that carbohydrates (carbs) and alcohol have immediate effects on your blood glucose levels. It is important to count carbs and to use alcohol carefully. This information is not intended to replace advice given to you by your health care provider. Make sure you discuss any questions you have with your health care provider. Document Revised: 10/05/2017 Document Reviewed: 11/27/2016 Elsevier Patient Education  2020 [employer].

## 2020-09-06 NOTE — Progress Notes (Signed)
Subjective:    Patient ID: [name], male    DOB: 10-27-1946, 74 y.o.   MRN: [id number]  No chief complaint on file.   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  Patient endorses continued right-sided low back pain x3 weeks.  Patient endorses pain with prolonged standing and when trying to move from sitting to standing.  Patient tried ibuprofen and his regular tramadol which provides a little relief until they wear off.  Patient is not doing any heavy lifting at work at [employer].  Patient wearing a back support brace while at work.  Patient mentions diarrhea since taking 1000 mg twice daily.  Patient endorses that feels like it "bubbling".  Also causing nausea.  Pt taking medication with food but it has not helped.  Past Medical History:  Diagnosis Date  . CAD, ARTERY BYPASS GRAFT 02/10/2008  . CAROTID ARTERY STENOSIS, WITHOUT INFARCTION 09/22/2008  . CORONARY ARTERY DISEASE 04/03/2008  . Diabetes mellitus    "borderline"  . ERECTILE DYSFUNCTION 01/14/2009  . GOUT 01/16/2008  . HERPES ZOSTER 04/15/2010  . HYPERLIPIDEMIA 01/16/2008  . HYPERTENSION 01/16/2008  . IMPAIRED GLUCOSE TOLERANCE 08/22/2010  . Shingles 2011   left chest  . Ulcer 30 yrs ago   stomach    No Known Allergies  ROS General: Denies fever, chills, night sweats, changes in weight, changes in appetite HEENT: Denies headaches, ear pain, changes in vision, rhinorrhea, sore throat CV: Denies CP, palpitations, SOB, orthopnea Pulm: Denies SOB, cough, wheezing GI: Denies abdominal pain, nausea, vomiting, diarrhea, constipation GU: Denies dysuria, hematuria, frequency Msk: Denies muscle cramps, joint pains +Low back pain Neuro: Denies weakness, numbness, tingling Skin: Denies rashes, bruising Psych: Denies depression, anxiety, hallucinations       Objective:    Blood pressure 132/74, pulse 86, temperature 97.9 F (36.6 C), temperature source Oral, weight 289 lb (131.1 kg), SpO2 95 %.  Gen. Pleasant, well-nourished, in no distress, obese, normal affect  HEENT: [place]/AT, face symmetric, conjunctiva clear, no scleral icterus, PERRLA, EOMI, nares patent without drainage Lungs: no accessory muscle use, CTAB, no wheezes or rales Cardiovascular: RRR, no m/r/g, no peripheral edema Abdomen: BS present, soft, NT/ND, no hepatosplenomegaly. Musculoskeletal: TTP of right lumbar paraspinal muscles, no TTP of cervical, thoracic, or lumbar spine midline.  Limited flexion and extension 2/2 discomfort.  No LE weakness.  no deformities, no cyanosis or clubbing, normal tone Neuro:  A&Ox3, CN II-XII intact, normal gait Skin:  Warm, no lesions/ rash   Wt Readings from Last 3 Encounters:  08/05/20 295 lb 6.4 oz (134 kg)  04/08/20 (!) 303 lb (137.4 kg)  05/21/19 279 lb (126.6 kg)    Lab Results  Component Value Date   WBC 6.0 08/05/2020   HGB 12.1 (L) 08/05/2020   HCT 37.3 (L) 08/05/2020   PLT 227 08/05/2020   GLUCOSE 110 (H) 08/05/2020   CHOL 164 04/08/2020   TRIG 58.0 04/08/2020   HDL 67.10 04/08/2020   LDLDIRECT 130.4 11/06/2011   LDLCALC 86 04/08/2020   ALT 29 08/05/2020   AST 30 08/05/2020   NA 139 08/05/2020   K 5.0 08/05/2020   CL 108 08/05/2020   CREATININE 1.32 (H) 08/05/2020   BUN 27 (H) 08/05/2020   CO2 22 08/05/2020   TSH 2.31 07/21/2016   PSA 0.17 08/05/2020  INR 1.5 02/10/2008   HGBA1C 6.5 (H) 08/05/2020   MICROALBUR 75.9 08/18/2020    Assessment/Plan:  Acute right-sided low back pain with right-sided sciatica  -Discussed possible causes including DDD, disc herniation, scoliosis.  Also consider muscular causes. -Continue supportive care including heat, ice, stretching, Tylenol for NSAIDs as needed, massage -We will obtain imaging given duration of symptoms. -Given precautions -Given radicular pain discussed  starting gabapentin 100 mg nightly.  Discussed r/b/a -For continued or worsening symptoms consider follow-up with Ortho and/or PT - Plan: DG Lumbar Spine Complete, Urine Culture, POCT urinalysis dipstick, Urine Culture, POCT urinalysis dipstick, gabapentin (NEURONTIN) 100 MG capsule  Type 2 diabetes mellitus with peripheral vascular disease (HCC) -Last hemoglobin A1c 6.5% on 08/05/2020 -Microalbumin creatinine ratio increased to 607 on 08/18/2020.  Previously to 294 on 08/05/2020 -Continue lisinopril 20 mg.  Consider increasing dose. -Discussed switching from Metformin regular release 1000 mg twice daily to Metformin ER 1000 mg daily to see GI symptoms improve -Discussed lifestyle modifications including increasing physical activity and diet modifications. -Patient encouraged to check blood sugar at home regularly and keep a log to bring with him to clinic or bring glucometer. -Given precautions -On ACEI and statin - Plan: metFORMIN (GLUMETZA) 1000 MG (MOD) 24 hr tablet  HTN, essential -Controlled -Continue current medications including lisinopril 20 mg, Norvasc 10 mg, Lopressor 50 mg twice daily -Continue lifestyle modifications -Encouraged to check BP at home  Atherosclerosis of coronary artery bypass graft of native heart, unspecified whether angina present -Stable -Total cholesterol 164, HDL 67.1, LDL 86, triglycerides 58 on 04/08/2020 -Continue ASA 325 mg, atorvastatin 80 mg -NTG SL 0.4 mg refilled -Continue lifestyle modifications  F/u in 2-4 weeks, sooner if needed  Grier Mitts, MD

## 2020-09-07 ENCOUNTER — Other Ambulatory Visit: Payer: Self-pay | Admitting: Family Medicine

## 2020-09-07 DIAGNOSIS — M5441 Lumbago with sciatica, right side: Secondary | ICD-10-CM

## 2020-09-07 LAB — URINE CULTURE
MICRO NUMBER:: 11143199
SPECIMEN QUALITY:: ADEQUATE

## 2020-09-09 ENCOUNTER — Encounter: Payer: Self-pay | Admitting: Family Medicine

## 2020-09-10 ENCOUNTER — Telehealth: Payer: Self-pay | Admitting: Family Medicine

## 2020-09-10 NOTE — Telephone Encounter (Signed)
Pt called back to get his xray results

## 2020-09-14 ENCOUNTER — Other Ambulatory Visit: Payer: Self-pay

## 2020-09-14 DIAGNOSIS — M47819 Spondylosis without myelopathy or radiculopathy, site unspecified: Secondary | ICD-10-CM

## 2020-09-14 NOTE — Telephone Encounter (Signed)
Reviewed x-ray results with pt verbalized understanding 

## 2020-09-19 NOTE — Progress Notes (Signed)
Medical screening examination/treatment was performed by qualified clinical staff member and as supervising physician I was immediately available for consultation/collaboration.  [name], MD

## 2020-09-24 ENCOUNTER — Ambulatory Visit (INDEPENDENT_AMBULATORY_CARE_PROVIDER_SITE_OTHER): Payer: BC Managed Care – PPO

## 2020-09-24 ENCOUNTER — Other Ambulatory Visit: Payer: Self-pay

## 2020-09-24 DIAGNOSIS — E538 Deficiency of other specified B group vitamins: Secondary | ICD-10-CM | POA: Diagnosis not present

## 2020-09-24 MED ORDER — CYANOCOBALAMIN 1000 MCG/ML IJ SOLN
1000.0000 ug | Freq: Once | INTRAMUSCULAR | Status: AC
  Administered 2020-09-24: 1000 ug via INTRAMUSCULAR

## 2020-09-24 NOTE — Progress Notes (Signed)
Per orders of Dr. Volanda Napoleon, injection of Cyanocobolamin 1,000 mcg/mL given by Wyvonne Lenz. Patient tolerated injection well.

## 2020-09-29 ENCOUNTER — Other Ambulatory Visit: Payer: Self-pay | Admitting: Family Medicine

## 2020-09-29 DIAGNOSIS — E1151 Type 2 diabetes mellitus with diabetic peripheral angiopathy without gangrene: Secondary | ICD-10-CM

## 2020-09-29 MED ORDER — GLIPIZIDE ER 5 MG PO TB24: 5.0000 mg | ORAL_TABLET | Freq: Every day | ORAL | 1 refills | Status: DC

## 2020-09-29 NOTE — Telephone Encounter (Signed)
Spoke with pt verbalized understanding understanding to Discontinue taking Metformin and to continue taking Glipizide which was increased and a new Rx sent to pharmacy, Pt is aware to call PT for scheduling

## 2020-09-29 NOTE — Telephone Encounter (Signed)
Okay to discontinue Metformin XR 500 mg daily.  Will increase dose of glipizide XR.  New prescription sent in for glipizide XR 5 mg daily.  Continue checking blood sugar and keep a log to bring with you to clinic.

## 2020-09-29 NOTE — Telephone Encounter (Signed)
Spoke with pt state that he is having diarrhea from taking Metformin, he wants to know what he can take that does not make stomach upset. Refill not sent for refill since pt state that he is not taking Rx. Please advise

## 2020-10-05 ENCOUNTER — Other Ambulatory Visit: Payer: Self-pay | Admitting: Family Medicine

## 2020-10-05 DIAGNOSIS — M25562 Pain in left knee: Secondary | ICD-10-CM

## 2020-10-05 DIAGNOSIS — E785 Hyperlipidemia, unspecified: Secondary | ICD-10-CM

## 2020-10-05 DIAGNOSIS — M5441 Lumbago with sciatica, right side: Secondary | ICD-10-CM

## 2020-10-05 DIAGNOSIS — G8929 Other chronic pain: Secondary | ICD-10-CM

## 2020-10-11 ENCOUNTER — Encounter: Payer: Self-pay | Admitting: Family Medicine

## 2020-10-11 ENCOUNTER — Other Ambulatory Visit: Payer: Self-pay

## 2020-10-11 ENCOUNTER — Ambulatory Visit (INDEPENDENT_AMBULATORY_CARE_PROVIDER_SITE_OTHER): Payer: BC Managed Care – PPO | Admitting: Family Medicine

## 2020-10-11 VITALS — BP 128/70 | HR 67 | Temp 97.9°F | Wt 295.4 lb

## 2020-10-11 DIAGNOSIS — D170 Benign lipomatous neoplasm of skin and subcutaneous tissue of head, face and neck: Secondary | ICD-10-CM

## 2020-10-11 DIAGNOSIS — S46812A Strain of other muscles, fascia and tendons at shoulder and upper arm level, left arm, initial encounter: Secondary | ICD-10-CM | POA: Diagnosis not present

## 2020-10-11 DIAGNOSIS — S46811A Strain of other muscles, fascia and tendons at shoulder and upper arm level, right arm, initial encounter: Secondary | ICD-10-CM | POA: Diagnosis not present

## 2020-10-11 DIAGNOSIS — I25810 Atherosclerosis of coronary artery bypass graft(s) without angina pectoris: Secondary | ICD-10-CM

## 2020-10-11 DIAGNOSIS — M25511 Pain in right shoulder: Secondary | ICD-10-CM

## 2020-10-11 DIAGNOSIS — M25512 Pain in left shoulder: Secondary | ICD-10-CM | POA: Diagnosis not present

## 2020-10-11 DIAGNOSIS — S161XXA Strain of muscle, fascia and tendon at neck level, initial encounter: Secondary | ICD-10-CM

## 2020-10-11 NOTE — Progress Notes (Signed)
Subjective:    Patient ID: [name], male    DOB: 04/30/1946, 74 y.o.   MRN: [id number]  No chief complaint on file.   HPI Patient was seen today for acute concern.  Pt endorses neck and shoulder pain x a little over a wk.  Symptoms start after working for about 3 hours.  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  BC powder helps symptoms.  Will take Tylenol at times but was afraid to take too much.  Denies numbness/tingling into UEs b/l.  Pt requesting refills on gabapentin and tramadol.  Past Medical History:  Diagnosis Date  . CAD, ARTERY BYPASS GRAFT 02/10/2008  . CAROTID ARTERY STENOSIS, WITHOUT INFARCTION 09/22/2008  . CORONARY ARTERY DISEASE 04/03/2008  . Diabetes mellitus    "borderline"  . ERECTILE DYSFUNCTION 01/14/2009  . GOUT 01/16/2008  . HERPES ZOSTER 04/15/2010  . HYPERLIPIDEMIA 01/16/2008  . HYPERTENSION 01/16/2008  . IMPAIRED GLUCOSE TOLERANCE 08/22/2010  . Shingles 2011   left chest  . Ulcer 30 yrs ago   stomach    No Known Allergies  ROS General: Denies fever, chills, night sweats, changes in weight, changes in appetite HEENT: Denies headaches, ear pain, changes in vision, rhinorrhea, sore throat CV: Denies CP, palpitations, SOB, orthopnea Pulm: Denies SOB, cough, wheezing GI: Denies abdominal pain, nausea, vomiting, diarrhea, constipation GU: Denies dysuria, hematuria, frequency Msk: Denies muscle cramps, joint pains  +shoulder and neck pain Neuro: Denies weakness, numbness, tingling Skin: Denies rashes, bruising Psych: Denies depression, anxiety, hallucinations      Objective:    Blood pressure 128/70, pulse 67, temperature 97.9 F (36.6 C), temperature source Oral, weight 295 lb 6.4 oz (134 kg), SpO2 97 %.   Gen. Pleasant, well-nourished, in no distress, normal affect  HEENT: [place]/AT, face symmetric, conjunctiva clear, no scleral icterus, PERRLA, EOMI, nares patent without drainage, pharynx without erythema or  exudate. Neck: No JVD, no thyromegaly, no carotid bruits Lungs: no accessory muscle use, CTAB, no wheezes or rales Cardiovascular: RRR, no m/r/g, no peripheral edema Musculoskeletal: TTP of b/l shoulders with tension in trapezius muscle.  FROM of b/l UEs.  No deformities, no cyanosis or clubbing Neuro:  A&Ox3, CN II-XII intact, normal gait Skin:  Warm, no lesions/ rash.  R anterior shoulder superior to medial clavicle with a mobile, mass at least 7 cm in size, soft, fatty in consistency.   Wt Readings from Last 3 Encounters:  10/11/20 295 lb 6.4 oz (134 kg)  09/06/20 289 lb (131.1 kg)  08/05/20 295 lb 6.4 oz (134 kg)    Lab Results  Component Value Date   WBC 6.0 08/05/2020   HGB 12.1 (L) 08/05/2020   HCT 37.3 (L) 08/05/2020   PLT 227 08/05/2020   GLUCOSE 110 (H) 08/05/2020   CHOL 164 04/08/2020   TRIG 58.0 04/08/2020   HDL 67.10 04/08/2020   LDLDIRECT 130.4 11/06/2011   LDLCALC 86 04/08/2020   ALT 29 08/05/2020   AST 30 08/05/2020   NA 139 08/05/2020   K 5.0 08/05/2020   CL 108 08/05/2020   CREATININE 1.32 (H) 08/05/2020   BUN 27 (H) 08/05/2020   CO2 22 08/05/2020   TSH 2.31 07/21/2016   PSA 0.17 08/05/2020   INR 1.5 02/10/2008   HGBA1C 6.5 (H) 08/05/2020   MICROALBUR 75.9 08/18/2020    Assessment/Plan:  Acute pain of both shoulders -shoulder/neck symptoms likely 2/2 posture/postions at work causing muscle strain. -Discussed ergonomic modifications for workspace. -Discussed supportive  care including stretching, heat, massage, ice, topical analgesics -Okay to use Tylenol as needed up to 3000 mg daily in divided doses.  -Patient to try arthritis strength Tylenol prior to work. -discussed muscle relaxer, however may cause drowsiness -consider PT for continued or worsening symptoms.  Strain of cervical portion of both trapezius muscles  Strain of left trapezius muscle, initial encounter  Trapezius strain, right, initial encounter  Lipoma of neck  -given  location and size discussed referral to surgery for removal - Plan: Ambulatory referral to General Surgery  Rx's for gabapentin and tramadol sent via open refill request.  F/u prn  Grier Mitts, MD

## 2020-10-11 NOTE — Patient Instructions (Addendum)
You can try taking Tylenol arthritis strength each morning prior to work for your shoulder and neck pain.  Arthritis strength can be found on the shelf at your local drugstore, [employer], [employer] or on line.  This should be gentler on your stomach and ibuprofen, Aleve, Advil.  You can also try using Biofreeze or icy hot cream for continued muscle strain symptoms.  They can also be found at the same location.     Back Injury Prevention Back injuries can be very painful. They can also be difficult to heal. After having one back injury, you are more likely to have another one again. It is important to learn how to avoid injuring or re-injuring your back. The following tips can help you to prevent a back injury. What actions can I take to prevent back injuries? Changes in your diet Talk with your doctor about what to eat. Some foods can make the bones strong.  Talk with your doctor about how much calcium and vitamin D you need each day. These nutrients help to prevent weakening of the bones (osteoporosis).  Eat foods that have calcium. These include: ? Dairy products. ? Green leafy vegetables. ? Food and drinks that have had calcium added to them (fortified).  Eat foods that have vitamin D. These include: ? Milk. ? Food and drinks that have had vitamin D added to them.  Take other supplements and vitamins only as told by your doctor. Physical fitness Physical fitness makes your bones and muscles strong. It also improves your balance and strength.  Exercise for 30 minutes per day on most days of the week, or as told by your doctor. Make sure to: ? Do aerobic exercises, such as walking, jogging, biking, or swimming. ? Do exercises that increase balance and strength, such as tai chi and yoga. ? Do stretching exercises. This helps with flexibility. ? Develop strong belly (abdominal) muscles. Your belly muscles help to support your back.  Stay at a healthy weight. This lowers your risk of a  back injury. Good posture        Prevent back injuries by developing and maintaining a good posture. To do this:  Sit up straight and stand up straight. Avoid leaning forward when you sit or hunching over when you stand.  Choose chairs that have good low-back (lumbar) support.  If you work at a desk: ? Sit close to it so you do not need to lean over. ? Keep your chin tucked in. ? Keep your neck drawn back. ? Keep your elbows bent so that your arms make a corner (right angle).  When you drive: ? Sit high and close to the steering wheel. Add a low-back support to your car seat, if needed. ? Take breaks every hour if you are driving for long periods of time.  Avoid sitting or standing in one position for very long. Take breaks to get up, stretch, and walk around at least once every hour.  Sleep on your side with your knees slightly bent, or sleep on your back with a pillow under your knees.  Lifting, twisting, and reaching   Heavy lifting ? Avoid heavy lifting, especially lifting over and over again. If you must do heavy lifting:  Stretch before lifting.  Work slowly.  Rest between lifts.  Use a tool such as a cart or a dolly to move objects if one is available.  Make several small trips instead of carrying one heavy load.  Ask for help when you  need it, especially when moving big objects. ? Follow these steps when lifting:  Stand with your feet shoulder-width apart.  Get as close to the object as you can. Do not pick up a heavy object that is far from your body.  Use handles or lifting straps if they are available.  Bend at your knees. Squat down, but keep your heels off the floor.  Keep your shoulders back. Keep your chin tucked in. Keep your back straight.  Lift the object slowly while you tighten the muscles in your legs, belly, and bottom. Keep the object as close to the center of your body as possible. ? Follow these steps when putting down a heavy  load:  Stand with your feet shoulder-width apart.  Lower the object slowly while you tighten the muscles in your legs, belly, and bottom. Keep the object as close to the center of your body as possible.  Keep your shoulders back. Keep your chin tucked in. Keep your back straight.  Bend at your knees. Squat down, but keep your heels off the floor.  Use handles or lifting straps if they are available.  Twisting and reaching ? Avoid lifting heavy objects above your waist. ? Do not twist at your waist while you are lifting or carrying a load. If you need to turn, move your feet. ? Do not bend over without bending at your knees. ? Avoid reaching over your head, across a table, or for an object on a high surface. Other things to do   Avoid wet floors and icy ground. Keep sidewalks clear of ice to prevent falls.  Do not sleep on a mattress that is too soft or too hard.  Store heavier objects on shelves at waist level.  Store lighter objects on lower or higher shelves.  Find ways to lower your stress, such as: ? Exercise. ? Massage. ? Relaxation techniques.  Talk with your doctor if you feel anxious or depressed. These conditions can make back pain worse.  Wear flat heel shoes with cushioned soles.  Use both shoulder straps when carrying a backpack.  Do not use any products that contain nicotine or tobacco, such as cigarettes and e-cigarettes. If you need help quitting, ask your doctor. Summary  Back injuries can be very painful and difficult to heal.  You can keep your back healthy by making certain changes. These include eating foods that make bones strong, working on being physically fit, developing a good posture, and lifting heavy objects in a safe way. This information is not intended to replace advice given to you by your health care provider. Make sure you discuss any questions you have with your health care provider. Document Revised: 07/16/2019 Document Reviewed:  12/14/2017 Elsevier Patient Education  [place].  Thoracic Strain A thoracic strain is an injury to the muscles or tendons that attach to the upper back. Tendons are tissues that connect muscle to bone. This injury is sometimes called a mid-back strain. A strain can be mild or very bad. A mild strain may take only 1-2 weeks to heal. A very bad strain involves torn muscles or tendons, so it may take 6-8 weeks to heal. What are the causes? This condition may be caused by:  A fall or a hit to the body.  Twisting or stretching the back too far. This may happen when doing activities that require a lot of energy, such as lifting heavy objects. In some cases, the cause may not be known. What  increases the risk? This injury is more common in:  Athletes.  People who are very overweight (obese). What are the signs or symptoms?  Pain in the middle back, especially with movement. This is the main symptom.  Stiffness or limited range of motion.  Sudden muscle tightening (spasms). How is this treated? This condition may be treated with:  Resting the injured area.  Putting heat and cold on the injured area.  Medicines for pain and inflammation, such as NSAIDs.  Prescription medicine for pain or to relax the muscles. These may be used for a short time if needed.  Physical therapy. This will involve doing exercises to stretch and strengthen the middle back. Follow these instructions at home: Managing pain, stiffness, and swelling      If told, put ice on the injured area. ? Put ice in a plastic bag. ? Place a towel between your skin and the bag. ? Leave the ice on for 20 minutes, 2-3 times a day.  If told, put heat on the affected area. Do this as often as told by your doctor. Use the heat source that your doctor recommends, such as a moist heat pack or a heating pad. ? Place a towel between your skin and the heat source. ? Leave the heat on for 20-30 minutes. ? Remove  the heat if your skin turns bright red. This is very important if you are unable to feel pain, heat, or cold. You may have a greater risk of getting burned. Activity  Rest and return to your normal activities as told by your doctor. Ask your doctor what activities are safe for you.  Do exercises as told by your doctor. Medicines  Take over-the-counter and prescription medicines only as told by your doctor.  Ask your doctor if the medicine prescribed to you: ? Requires you to avoid driving or using heavy machinery. ? Can cause trouble pooping (constipation). You may need to take steps to prevent or treat trouble pooping:  Drink enough fluid to keep your pee (urine) pale yellow.  Take over-the-counter or prescription medicines.  Eat foods that are high in fiber. These include beans, whole grains, and fresh fruits and vegetables.  Limit foods that are high in fat and processed sugars. These include fried or sweet foods. Injury prevention To prevent a future mid-back injury:  Always warm up before physical activity or sports.  Cool down and stretch after being active.  Use correct form when playing sports and lifting heavy objects. Bend your knees before you lift heavy objects.  Use good posture when sitting and standing.  Stay physically fit and maintain a healthy weight. ? Do at least 150 minutes of moderate-intensity exercise each week, such as brisk walking or water aerobics. ? Do strength exercises at least 2 times each week.  General instructions  Do not use any products that contain nicotine or tobacco. These products include cigarettes, e-cigarettes, and chewing tobacco. If you need help quitting, ask your doctor.  Keep all follow-up visits as told by your doctor. This is important. Contact a doctor if:  Your pain is not helped by medicine.  Your pain or stiffness is getting worse.  You have pain or stiffness in your neck or lower back. Get help right away if  you:  Have shortness of breath.  Have chest pain.  Have weakness or loss of feeling (numbness) in your legs or arms.  Cannot control when you pee (urinate). Summary  A thoracic strain is an  injury to the muscles or tendons that attach to the upper back.  If told, put ice or heat on the affected area.  Rest and return to your normal activities as told by your doctor.  Keep all follow-up visits as told by your doctor. This information is not intended to replace advice given to you by your health care provider. Make sure you discuss any questions you have with your health care provider. Document Revised: 09/10/2018 Document Reviewed: 09/10/2018 Elsevier Patient Education  [place].  Lipoma  A lipoma is a noncancerous (benign) tumor that is made up of fat cells. This is a very common type of soft-tissue growth. Lipomas are usually found under the skin (subcutaneous). They may occur in any tissue of the body that contains fat. Common areas for lipomas to appear include the back, arms, shoulders, buttocks, and thighs. Lipomas grow slowly, and they are usually painless. Most lipomas do not cause problems and do not require treatment. What are the causes? The cause of this condition is not known. What increases the risk? You are more likely to develop this condition if:  You are 68-5 years old.  You have a family history of lipomas. What are the signs or symptoms? A lipoma usually appears as a small, round bump under the skin. In most cases, the lump will:  Feel soft or rubbery.  Not cause pain or other symptoms. However, if a lipoma is located in an area where it pushes on nerves, it can become painful or cause other symptoms. How is this diagnosed? A lipoma can usually be diagnosed with a physical exam. You may also have tests to confirm the diagnosis and to rule out other conditions. Tests may include:  Imaging tests, such as a CT scan or an MRI.  Removal of a  tissue sample to be looked at under a microscope (biopsy). How is this treated? Treatment for this condition depends on the size of the lipoma and whether it is causing any symptoms.  For small lipomas that are not causing problems, no treatment is needed.  If a lipoma is bigger or it causes problems, surgery may be done to remove the lipoma. Lipomas can also be removed to improve appearance. Most often, the procedure is done after applying a medicine that numbs the area (local anesthetic).  Liposuction may be done to reduce the size of the lipoma before it is removed through surgery, or it may be done to remove the lipoma. Lipomas are removed with this method in order to limit incision size and scarring. A liposuction tube is inserted through a small incision into the lipoma, and the contents of the lipoma are removed through the tube with suction. Follow these instructions at home:  Watch your lipoma for any changes.  Keep all follow-up visits as told by your health care provider. This is important. Contact a health care provider if:  Your lipoma becomes larger or hard.  Your lipoma becomes painful, red, or increasingly swollen. These could be signs of infection or a more serious condition. Get help right away if:  You develop tingling or numbness in an area near the lipoma. This could indicate that the lipoma is causing nerve damage. Summary  A lipoma is a noncancerous tumor that is made up of fat cells.  Most lipomas do not cause problems and do not require treatment.  If a lipoma is bigger or it causes problems, surgery may be done to remove the lipoma.  Contact a  health care provider if your lipoma becomes larger or hard, or if it becomes painful, red, or increasingly swollen. Pain, redness, and swelling could be signs of infection or a more serious condition. This information is not intended to replace advice given to you by your health care provider. Make sure you discuss any  questions you have with your health care provider. Document Revised: 06/09/2019 Document Reviewed: 06/09/2019 Elsevier Patient Education  [place].  Cervical Strain and Sprain Rehab Ask your health care provider which exercises are safe for you. Do exercises exactly as told by your health care provider and adjust them as directed. It is normal to feel mild stretching, pulling, tightness, or discomfort as you do these exercises. Stop right away if you feel sudden pain or your pain gets worse. Do not begin these exercises until told by your health care provider. Stretching and range-of-motion exercises Cervical side bending  1. Using good posture, sit on a stable chair or stand up. 2. Without moving your shoulders, slowly tilt your left / right ear to your shoulder until you feel a stretch in the opposite side neck muscles. You should be looking straight ahead. 3. Hold for __________ seconds. 4. Repeat with the other side of your neck. Repeat __________ times. Complete this exercise __________ times a day. Cervical rotation  1. Using good posture, sit on a stable chair or stand up. 2. Slowly turn your head to the side as if you are looking over your left / right shoulder. ? Keep your eyes level with the ground. ? Stop when you feel a stretch along the side and the back of your neck. 3. Hold for __________ seconds. 4. Repeat this by turning to your other side. Repeat __________ times. Complete this exercise __________ times a day. Thoracic extension and pectoral stretch 1. Roll a towel or a small blanket so it is about 4 inches (10 cm) in diameter. 2. Lie down on your back on a firm surface. 3. Put the towel lengthwise, under your spine in the middle of your back. It should not be under your shoulder blades. The towel should line up with your spine from your middle back to your lower back. 4. Put your hands behind your head and let your elbows fall out to your sides. 5. Hold for  __________ seconds. Repeat __________ times. Complete this exercise __________ times a day. Strengthening exercises Isometric upper cervical flexion 1. Lie on your back with a thin pillow behind your head and a small rolled-up towel under your neck. 2. Gently tuck your chin toward your chest and nod your head down to look toward your feet. Do not lift your head off the pillow. 3. Hold for __________ seconds. 4. Release the tension slowly. Relax your neck muscles completely before you repeat this exercise. Repeat __________ times. Complete this exercise __________ times a day. Isometric cervical extension  1. Stand about 6 inches (15 cm) away from a wall, with your back facing the wall. 2. Place a soft object, about 6-8 inches (15-20 cm) in diameter, between the back of your head and the wall. A soft object could be a small pillow, a ball, or a folded towel. 3. Gently tilt your head back and press into the soft object. Keep your jaw and forehead relaxed. 4. Hold for __________ seconds. 5. Release the tension slowly. Relax your neck muscles completely before you repeat this exercise. Repeat __________ times. Complete this exercise __________ times a day. Posture and body mechanics  Body mechanics refers to the movements and positions of your body while you do your daily activities. Posture is part of body mechanics. Good posture and healthy body mechanics can help to relieve stress in your body's tissues and joints. Good posture means that your spine is in its natural S-curve position (your spine is neutral), your shoulders are pulled back slightly, and your head is not tipped forward. The following are general guidelines for applying improved posture and body mechanics to your everyday activities. Sitting  1. When sitting, keep your spine neutral and keep your feet flat on the floor. Use a footrest, if necessary, and keep your thighs parallel to the floor. Avoid rounding your shoulders, and avoid  tilting your head forward. 2. When working at a desk or a computer, keep your desk at a height where your hands are slightly lower than your elbows. Slide your chair under your desk so you are close enough to maintain good posture. 3. When working at a computer, place your monitor at a height where you are looking straight ahead and you do not have to tilt your head forward or downward to look at the screen. Standing   When standing, keep your spine neutral and keep your feet about hip-width apart. Keep a slight bend in your knees. Your ears, shoulders, and hips should line up.  When you do a task in which you stand in one place for a long time, place one foot up on a stable object that is 2-4 inches (5-10 cm) high, such as a footstool. This helps keep your spine neutral. Resting When lying down and resting, avoid positions that are most painful for you. Try to support your neck in a neutral position. You can use a contour pillow or a small rolled-up towel. Your pillow should support your neck but not push on it. This information is not intended to replace advice given to you by your health care provider. Make sure you discuss any questions you have with your health care provider. Document Revised: 02/12/2019 Document Reviewed: 07/24/2018 Elsevier Patient Education  [place].

## 2020-10-19 ENCOUNTER — Telehealth: Payer: Self-pay | Admitting: Family Medicine

## 2020-10-19 NOTE — Telephone Encounter (Signed)
Pt called the office stating the someone had given him a call but there were no msgs to be seen that someone had given him a call.

## 2020-10-20 ENCOUNTER — Other Ambulatory Visit: Payer: Self-pay | Admitting: Family Medicine

## 2020-10-22 ENCOUNTER — Ambulatory Visit (INDEPENDENT_AMBULATORY_CARE_PROVIDER_SITE_OTHER): Payer: BC Managed Care – PPO

## 2020-10-22 ENCOUNTER — Other Ambulatory Visit: Payer: Self-pay

## 2020-10-22 DIAGNOSIS — E538 Deficiency of other specified B group vitamins: Secondary | ICD-10-CM | POA: Diagnosis not present

## 2020-10-22 MED ORDER — CYANOCOBALAMIN 1000 MCG/ML IJ SOLN
1000.0000 ug | Freq: Once | INTRAMUSCULAR | Status: AC
  Administered 2020-10-22: 1000 ug via INTRAMUSCULAR

## 2020-10-22 NOTE — Progress Notes (Signed)
Per orders of DrL, injection of Cyanocobolamin 1,079mcg/mL giiven by Wyvonne Lenz. Patient tolerated injection well.

## 2020-10-25 NOTE — Telephone Encounter (Signed)
Pt had appt 12/17.

## 2020-11-03 DIAGNOSIS — Z23 Encounter for immunization: Secondary | ICD-10-CM | POA: Diagnosis not present

## 2020-11-17 DIAGNOSIS — D171 Benign lipomatous neoplasm of skin and subcutaneous tissue of trunk: Secondary | ICD-10-CM | POA: Diagnosis not present

## 2020-12-15 ENCOUNTER — Other Ambulatory Visit: Payer: Self-pay | Admitting: Family Medicine

## 2020-12-15 DIAGNOSIS — G8929 Other chronic pain: Secondary | ICD-10-CM

## 2020-12-15 NOTE — Telephone Encounter (Signed)
Pt LOV was 10/11/2020 and last refill was done on 10/11/2020 for 60 tablets with 1 refill, please advise

## 2020-12-20 ENCOUNTER — Other Ambulatory Visit: Payer: Self-pay | Admitting: Family Medicine

## 2021-01-04 ENCOUNTER — Other Ambulatory Visit: Payer: Self-pay | Admitting: Family Medicine

## 2021-01-07 ENCOUNTER — Other Ambulatory Visit: Payer: Self-pay | Admitting: Family Medicine

## 2021-01-07 DIAGNOSIS — E1151 Type 2 diabetes mellitus with diabetic peripheral angiopathy without gangrene: Secondary | ICD-10-CM

## 2021-02-16 ENCOUNTER — Other Ambulatory Visit: Payer: Self-pay | Admitting: Family Medicine

## 2021-02-16 DIAGNOSIS — M25562 Pain in left knee: Secondary | ICD-10-CM

## 2021-02-16 DIAGNOSIS — G8929 Other chronic pain: Secondary | ICD-10-CM

## 2021-02-16 DIAGNOSIS — M5441 Lumbago with sciatica, right side: Secondary | ICD-10-CM

## 2021-03-23 ENCOUNTER — Other Ambulatory Visit: Payer: Self-pay | Admitting: Family Medicine

## 2021-03-23 DIAGNOSIS — E1151 Type 2 diabetes mellitus with diabetic peripheral angiopathy without gangrene: Secondary | ICD-10-CM

## 2021-04-11 ENCOUNTER — Ambulatory Visit (INDEPENDENT_AMBULATORY_CARE_PROVIDER_SITE_OTHER): Payer: BC Managed Care – PPO | Admitting: Family Medicine

## 2021-04-11 ENCOUNTER — Other Ambulatory Visit: Payer: Self-pay

## 2021-04-11 ENCOUNTER — Encounter: Payer: Self-pay | Admitting: Family Medicine

## 2021-04-11 VITALS — BP 128/72 | HR 66 | Temp 98.1°F | Wt 292.2 lb

## 2021-04-11 DIAGNOSIS — E1121 Type 2 diabetes mellitus with diabetic nephropathy: Secondary | ICD-10-CM | POA: Diagnosis not present

## 2021-04-11 DIAGNOSIS — M4726 Other spondylosis with radiculopathy, lumbar region: Secondary | ICD-10-CM | POA: Diagnosis not present

## 2021-04-11 DIAGNOSIS — M545 Low back pain, unspecified: Secondary | ICD-10-CM | POA: Diagnosis not present

## 2021-04-11 DIAGNOSIS — I10 Essential (primary) hypertension: Secondary | ICD-10-CM | POA: Diagnosis not present

## 2021-04-11 LAB — CBC WITH DIFFERENTIAL/PLATELET
Basophils Absolute: 0.1 10*3/uL (ref 0.0–0.1)
Basophils Relative: 1.1 % (ref 0.0–3.0)
Eosinophils Absolute: 0.2 10*3/uL (ref 0.0–0.7)
Eosinophils Relative: 3.4 % (ref 0.0–5.0)
HCT: 34 % — ABNORMAL LOW (ref 39.0–52.0)
Hemoglobin: 11.2 g/dL — ABNORMAL LOW (ref 13.0–17.0)
Lymphocytes Relative: 33.8 % (ref 12.0–46.0)
Lymphs Abs: 1.8 10*3/uL (ref 0.7–4.0)
MCHC: 32.8 g/dL (ref 30.0–36.0)
MCV: 99.3 fl (ref 78.0–100.0)
Monocytes Absolute: 0.5 10*3/uL (ref 0.1–1.0)
Monocytes Relative: 10.4 % (ref 3.0–12.0)
Neutro Abs: 2.7 10*3/uL (ref 1.4–7.7)
Neutrophils Relative %: 51.3 % (ref 43.0–77.0)
Platelets: 203 10*3/uL (ref 150.0–400.0)
RBC: 3.43 Mil/uL — ABNORMAL LOW (ref 4.22–5.81)
RDW: 15.9 % — ABNORMAL HIGH (ref 11.5–15.5)
WBC: 5.3 10*3/uL (ref 4.0–10.5)

## 2021-04-11 LAB — VITAMIN B12: Vitamin B-12: 710 pg/mL (ref 211–911)

## 2021-04-11 LAB — HEMOGLOBIN A1C: Hgb A1c MFr Bld: 5.6 % (ref 4.6–6.5)

## 2021-04-11 MED ORDER — TRAMADOL HCL 50 MG PO TABS: 100.0000 mg | ORAL_TABLET | Freq: Two times a day (BID) | ORAL | 0 refills | Status: DC | PRN

## 2021-04-11 NOTE — Patient Instructions (Addendum)
Chronic Back Pain When back pain lasts longer than 3 months, it is called chronic back pain. Pain may get worse at certain times (flare-ups). There are things you can do at home to manage your pain. Follow these instructions at home: Pay attention to any changes in your symptoms. Take these actions to help with your pain: Managing pain and stiffness  If told, put ice on the painful area. Your doctor may tell you to use ice for 24-48 hours after the flare-up starts. To do this: ? Put ice in a plastic bag. ? Place a towel between your skin and the bag. ? Leave the ice on for 20 minutes, 2-3 times a day.  If told, put heat on the painful area. Do this as often as told by your doctor. Use the heat source that your doctor recommends, such as a moist heat pack or a heating pad. ? Place a towel between your skin and the heat source. ? Leave the heat on for 20-30 minutes. ? Take off the heat if your skin turns bright red. This is especially important if you are unable to feel pain, heat, or cold. You may have a greater risk of getting burned.  Soak in a warm bath. This can help relieve pain.      Activity  Avoid bending and other activities that make pain worse.  When standing: ? Keep your upper back and neck straight. ? Keep your shoulders pulled back. ? Avoid slouching.  When sitting: ? Keep your back straight. ? Relax your shoulders. Do not round your shoulders or pull them backward.  Do not sit or stand in one place for long periods of time.  Take short rest breaks during the day. Lying down or standing is usually better than sitting. Resting can help relieve pain.  When sitting or lying down for a long time, do some mild activity or stretching. This will help to prevent stiffness and pain.  Get regular exercise. Ask your doctor what activities are safe for you.  Do not lift anything that is heavier than 10 lb (4.5 kg) or the limit that you are told, until your doctor says that it  is safe.  To prevent injury when you lift things: ? Bend your knees. ? Keep the weight close to your body. ? Avoid twisting.  Sleep on a firm mattress. Try lying on your side with your knees slightly bent. If you lie on your back, put a pillow under your knees.   Medicines  Treatment may include medicines for pain and swelling taken by mouth or put on the skin, prescription pain medicine, or muscle relaxants.  Take over-the-counter and prescription medicines only as told by your doctor.  Ask your doctor if the medicine prescribed to you: ? Requires you to avoid driving or using machinery. ? Can cause trouble pooping (constipation). You may need to take these actions to prevent or treat trouble pooping:  Drink enough fluid to keep your pee (urine) pale yellow.  Take over-the-counter or prescription medicines.  Eat foods that are high in fiber. These include beans, whole grains, and fresh fruits and vegetables.  Limit foods that are high in fat and sugars. These include fried or sweet foods. General instructions  Do not use any products that contain nicotine or tobacco, such as cigarettes, e-cigarettes, and chewing tobacco. If you need help quitting, ask your doctor.  Keep all follow-up visits as told by your doctor. This is important. Contact a doctor if:    Your pain does not get better with rest or medicine.  Your pain gets worse, or you have new pain.  You have a high fever.  You lose weight very quickly.  You have trouble doing your normal activities. Get help right away if:  One or both of your legs or feet feel weak.  One or both of your legs or feet lose feeling (have numbness).  You have trouble controlling when you poop (have a bowel movement) or pee (urinate).  You have bad back pain and: ? You feel like you may vomit (nauseous), or you vomit. ? You have pain in your belly (abdomen). ? You have shortness of breath. ? You faint. Summary  When back pain  lasts longer than 3 months, it is called chronic back pain.  Pain may get worse at certain times (flare-ups).  Use ice and heat as told by your doctor. Your doctor may tell you to use ice after flare-ups. This information is not intended to replace advice given to you by your health care provider. Make sure you discuss any questions you have with your health care provider. Document Revised: 12/03/2019 Document Reviewed: 12/03/2019 Elsevier Patient Education  2021 [employer].  https://doi.[email or web address]/[email or web address]">  Managing Chronic Back Pain Chronic back pain is back pain that lasts for 12 weeks or longer. It often affects the lower back. Back pain may feel like a muscle ache or a sharp, stabbing pain. It can be mild, moderate, or severe. If you have been diagnosed with chronic back pain, there are things you can do to manage your symptoms. You may have to try different things to see what works best for you. Your health care provider may also give you specific instructions. How to manage lifestyle changes Treating chronic back pain often starts with rest and pain relief, followed by exercises to restore movement and strength to your back (physical therapy). You may need surgery if other treatments do not help, or if your pain is caused by a condition or an injury. Follow your treatment plan as told by your health care provider. This may include:  Relaxation techniques.  Talk therapy or counseling with a mental health specialist. A form of talk therapy called cognitive behavioral therapy (CBT) can be especially helpful. This therapy helps you set goals and follow up on the changes that you make.  Acupuncture or massage therapy.  Local electrical stimulation.  Injections. These deliver numbing or pain-relieving medicines into your spine or the area of pain. How to recognize changes in your chronic back pain Your condition may improve with treatment. However, back pain may not go away or  may get worse over time. Watch your symptoms carefully and let your health care provider know if your symptoms get worse or do not improve. Your back pain may be getting worse if you have:  Pain that begins to cause problems with posture.  Pain that gets worse when you are sitting, standing, walking, bending, or lifting.  Pain that affects you while you are active, or at rest, or both.  Pain that eventually makes it hard to move around (limits mobility).  Pain that occurs with fever, weight loss, or difficulty urinating.  Pain that causes numbness and tingling. How to use body mechanics and posture to help with pain Healthy body mechanics and good posture can help to relieve stress on your back. Body mechanics refers to the movements and positions of your body during your daily activities. Posture is part of body mechanics.  Good posture means:  Your spine is in its natural S-curve, or neutral, position.  Your shoulders are pulled back slightly.  Your head is not tipped forward. Follow these guidelines to improve your posture and body mechanics in your everyday activities. Standing  When standing, keep your spine neutral and your feet about hip-width apart. Keep your knees slightly bent. Your ears, shoulders, and hips should line up.  When you do a task in which you stand in one place for a long time, place one foot on a stable object that is 2-4 inches (5-10 cm) high, such as a footstool. This helps keep your spine neutral.   Sitting  When sitting, keep your spine neutral and your feet flat on the floor. Use a footrest, if necessary, and keep your thighs parallel to the floor. Avoid rounding your shoulders, and avoid tilting your head forward.  When working at a desk or a computer, keep your desk at a height where your hands are slightly lower than your elbows. Slide your chair under your desk so you are close enough to maintain good posture.  When working at a computer, place your  monitor at a height where you are looking straight ahead and you do not have to tilt your head forward or downward to view the screen.   Lifting  Keep your feet at least shoulder-width apart and tighten the muscles of your abdomen.  Bend your knees and hips and keep your spine neutral. Be sure to lift using the strength of your legs, not your back. Do not lock your knees straight out.  Always ask for help to lift heavy or awkward objects.   Resting  When lying down and resting, avoid positions that are most painful.  If you have pain with activities such as sitting, bending, stooping, or squatting, lie in a position in which your body does not bend very much. For example, avoid curling up on your side with your arms and knees near your chest (fetal position).  If you have pain with activities such as standing for a long time or reaching with your arms, lie with your spine in a neutral position and bend your knees slightly. Try: ? Lying on your side with a pillow between your knees. ? Lying on your back with a pillow under your knees.   Follow these instructions at home: Medicines  Treatment may include over-the-counter or prescription medicines for pain and inflammation that are taken by mouth or applied to the skin. Another treatment may include muscle relaxants. Take over-the-counter and prescription medicines only as told by your health care provider.  Ask your health care provider if the medicine prescribed to you: ? Requires you to avoid driving or using machinery. ? Can cause constipation. You may need to take these actions to prevent or treat constipation:  Drink enough fluid to keep your urine pale yellow.  Take over-the-counter or prescription medicines.  Eat foods that are high in fiber, such as beans, whole grains, and fresh fruits and vegetables.  Limit foods that are high in fat and processed sugars, such as fried or sweet foods. Lifestyle  Do not use any products that  contain nicotine or tobacco, such as cigarettes, e-cigarettes, and chewing tobacco. If you need help quitting, ask your health care provider.  Eat a healthy diet that includes foods such as vegetables, fruits, fish, and lean meats.  Work with your health care provider to achieve or maintain a healthy weight. General instructions  Get  regular exercise as told. Exercise improves flexibility and strength.  If physical therapy was prescribed, do exercises as told by your health care provider.  Use ice or heat therapy as told by your health care provider.  Keep all follow-up visits as told by your health care provider. This is important. Where can I get support? Consider joining a support group for people managing chronic back pain. Ask your health care provider about support groups in your area. You can also find online and in-person support groups through:  The American Chronic Pain Association: theacpa.org  Pain Connection Program: painconnection.org Contact a health care provider if:  You have pain that is not relieved with rest or medicine.  Your pain gets worse, or you have new pain.  You have a fever.  You have rapid weight loss.  You have trouble doing your normal activities. Get help right away if:  You have weakness or numbness in one or both of your legs or feet.  You have trouble controlling your bladder or your bowels.  You have severe back pain and have any of the following: ? Nausea or vomiting. ? Abdominal pain. ? Shortness of breath or you faint. Summary  Chronic back pain is often treated with rest, pain relief, and physical therapy.  Talk therapy, acupuncture, massage, and local electrical stimulation may help.  Follow your treatment plan as told by your health care provider.  Joining a support group may help you manage chronic back pain. This information is not intended to replace advice given to you by your health care provider. Make sure you discuss  any questions you have with your health care provider. Document Revised: 12/04/2019 Document Reviewed: 08/12/2019 Elsevier Patient Education  [employer].

## 2021-04-11 NOTE — Progress Notes (Signed)
Subjective:    Patient ID: [name], male    DOB: 01/02/1946, 75 y.o.   MRN: [id number]  Chief Complaint  Patient presents with  . Pain    Low right side, past few days has been painful but today was not as bad. Sharp shooting pain, if turns wrong way it is more of a shooting pain. Taking tramodol     HPI Patient was seen today for acute R sided low back pain x several days.  Pt notes lifting a heavy box at work prior to start of symptoms.  Moving in certain positions causes the pain.  Pain moves across R side of back into R lateral thigh.  Pt taking tramadol, but it is not doing much. Had gabapentin, but only takes at night 2/2 causing drowsiness.  Pt inquires about injections as told by a co-worker that they help.  Pt wants to get back pain under control before he retires, requesting referral to Psychologist, sport and exercise.  Patient also inquires about prednisone.  Has history of DM 2.  Past Medical History:  Diagnosis Date  . CAD, ARTERY BYPASS GRAFT 02/10/2008  . CAROTID ARTERY STENOSIS, WITHOUT INFARCTION 09/22/2008  . CORONARY ARTERY DISEASE 04/03/2008  . Diabetes mellitus    "borderline"  . ERECTILE DYSFUNCTION 01/14/2009  . GOUT 01/16/2008  . HERPES ZOSTER 04/15/2010  . HYPERLIPIDEMIA 01/16/2008  . HYPERTENSION 01/16/2008  . IMPAIRED GLUCOSE TOLERANCE 08/22/2010  . Shingles 2011   left chest  . Ulcer 30 yrs ago   stomach    No Known Allergies  ROS General: Denies fever, chills, night sweats, changes in weight, changes in appetite HEENT: Denies headaches, ear pain, changes in vision, rhinorrhea, sore throat CV: Denies CP, palpitations, SOB, orthopnea Pulm: Denies SOB, cough, wheezing GI: Denies abdominal pain, nausea, vomiting, diarrhea, constipation GU: Denies dysuria, hematuria, frequency Msk: Denies muscle cramps, joint pains +R sided low back pain Neuro: Denies weakness, numbness, tingling Skin: Denies rashes, bruising Psych: Denies depression, anxiety, hallucinations      Objective:    Blood pressure 128/72, pulse 66, temperature 98.1 F (36.7 C), temperature source Oral, weight 292 lb 3.2 oz (132.5 kg), SpO2 94 %.  Gen. Pleasant, well-nourished, in no distress, normal affect  HEENT: [place]/AT, face symmetric, conjunctiva clear, no scleral icterus, PERRLA, EOMI, nares patent without drainage Lungs: no accessory muscle use, no wheezes or rales Cardiovascular: RRR, no peripheral edema Musculoskeletal: TTP on R sided lumbar paraspinal muscles.  Wearing a back brace.  No TTP of sciatic nerve, Cervical, thoracic spine.  Contracture of L 5th digit, no cyanosis or clubbing, normal tone Neuro:  A&Ox3, CN II-XII intact, normal gait Skin:  Warm, no lesions/ rash   Wt Readings from Last 3 Encounters:  04/11/21 292 lb 3.2 oz (132.5 kg)  10/11/20 295 lb 6.4 oz (134 kg)  09/06/20 289 lb (131.1 kg)    Lab Results  Component Value Date   WBC 6.0 08/05/2020   HGB 12.1 (L) 08/05/2020   HCT 37.3 (L) 08/05/2020   PLT 227 08/05/2020   GLUCOSE 110 (H) 08/05/2020   CHOL 164 04/08/2020   TRIG 58.0 04/08/2020   HDL 67.10 04/08/2020   LDLDIRECT 130.4 11/06/2011   LDLCALC 86 04/08/2020   ALT 29 08/05/2020   AST 30 08/05/2020   NA 139 08/05/2020   K 5.0 08/05/2020   CL 108 08/05/2020   CREATININE 1.32 (H) 08/05/2020   BUN 27 (H) 08/05/2020   CO2 22 08/05/2020   TSH 2.31 07/21/2016  PSA 0.17 08/05/2020   INR 1.5 02/10/2008   HGBA1C 6.5 (H) 08/05/2020   MICROALBUR 75.9 08/18/2020    Assessment/Plan:  Acute right-sided low back pain without sciatica  -Discussed supportive care including ice, heat, stretching, massage, topical analgesics -Given continued symptoms will place referral to PT - Plan: Ambulatory referral to Physical Therapy, Ambulatory referral to Neurosurgery, CBC with Differential/Platelet  Osteoarthritis of spine with radiculopathy, lumbar region  -X-ray lumbar spine from 09/06/2020 with severe degenerative changes with osteophyte formation and  endplate sclerosis.  Facet arthropathy also noted.  Intervertebral disc spaces maintained. - Plan: Ambulatory referral to Physical Therapy, Ambulatory referral to Neurosurgery, Vitamin B12, traMADol (ULTRAM) 50 MG tablet  Controlled type 2 diabetes mellitus with diabetic nephropathy, without long-term current use of insulin (HCC) -Hemoglobin A1c 6.5% on 08/05/2020 -Continue current medications including glipizide XL 5 mg -Continue lifestyle modifications -Continue statin and ACE I - Plan: Hemoglobin A1c  Essential hypertension  -controlled -Continue current medication including Norvasc 10 mg, lisinopril 20 mg, Lopressor 50 mg twice daily - Plan: Basic metabolic panel  F/u as needed  Grier Mitts, MD

## 2021-04-12 ENCOUNTER — Other Ambulatory Visit: Payer: Self-pay | Admitting: Family Medicine

## 2021-04-12 ENCOUNTER — Telehealth: Payer: Self-pay | Admitting: Family Medicine

## 2021-04-12 ENCOUNTER — Telehealth: Payer: Self-pay

## 2021-04-12 ENCOUNTER — Other Ambulatory Visit (INDEPENDENT_AMBULATORY_CARE_PROVIDER_SITE_OTHER): Payer: BC Managed Care – PPO

## 2021-04-12 DIAGNOSIS — E875 Hyperkalemia: Secondary | ICD-10-CM | POA: Diagnosis not present

## 2021-04-12 DIAGNOSIS — M4726 Other spondylosis with radiculopathy, lumbar region: Secondary | ICD-10-CM

## 2021-04-12 LAB — BASIC METABOLIC PANEL
BUN: 24 mg/dL — ABNORMAL HIGH (ref 6–23)
CO2: 22 mEq/L (ref 19–32)
Calcium: 9.4 mg/dL (ref 8.4–10.5)
Chloride: 110 mEq/L (ref 96–112)
Creatinine, Ser: 1.33 mg/dL (ref 0.40–1.50)
GFR: 53.33 mL/min — ABNORMAL LOW (ref >60.00)
Glucose, Bld: 87 mg/dL (ref 70–99)
Potassium: 6.1 mEq/L (ref 3.5–5.1)
Sodium: 138 mEq/L (ref 135–145)

## 2021-04-12 LAB — POTASSIUM: Potassium: 6.5 mEq/L (ref 3.5–5.1)

## 2021-04-12 MED ORDER — TRAMADOL HCL 50 MG PO TABS: 100.0000 mg | ORAL_TABLET | Freq: Two times a day (BID) | ORAL | 0 refills | Status: AC | PRN

## 2021-04-12 MED ORDER — SODIUM POLYSTYRENE SULFONATE PO POWD: Freq: Once | ORAL | 0 refills | Status: AC

## 2021-04-12 NOTE — Telephone Encounter (Signed)
Repeat potassium is 6.5

## 2021-04-12 NOTE — Telephone Encounter (Signed)
Stop Lisinopril. Continue monitoring BP. Avoid K+ rich diet. Kayexalate 15 g x 1, will cause diarrhea. If any CP,palpitation,or SOB he needs to seek immediate medical attention.  Labs tomorrow. Thanks, BJ

## 2021-04-12 NOTE — Telephone Encounter (Signed)
I called and spoke with patient. He is aware to stop his lisinopril and monitor his blood pressure. He will avoid K+ rich diet. He will pick up the Kayexalate, aware that it can cause diarrhea.   Aware that if he has any chest pain, palpitations, or SOB that he needs to go to the ED.  Lab appointment made for tomorrow morning, order entered as STAT.

## 2021-04-12 NOTE — Addendum Note (Signed)
Addended by: Nathanial Millman E on: 04/12/2021 03:30 PM   Modules accepted: Orders

## 2021-04-12 NOTE — Telephone Encounter (Signed)
Pts spouse is calling in to see if the tramadol can be resent to the pharmacy due to the pharmacy stating that they have not received it.  Pharm: [facility] on [address]

## 2021-04-12 NOTE — Telephone Encounter (Signed)
Received critical lab result of Potassium at 6.1. Spoke with Dr. Martin[place] - pt needs to repeat lab today, send as STAT. Pt notified and appointment made for 2:30pm this afternoon. Further recommendations will be made once new lab draw comes back.

## 2021-04-12 NOTE — Telephone Encounter (Signed)
Rx phoned in & left on pharmacy's voicemail.

## 2021-04-13 ENCOUNTER — Other Ambulatory Visit (INDEPENDENT_AMBULATORY_CARE_PROVIDER_SITE_OTHER): Payer: BC Managed Care – PPO

## 2021-04-13 ENCOUNTER — Other Ambulatory Visit: Payer: Self-pay

## 2021-04-13 DIAGNOSIS — E875 Hyperkalemia: Secondary | ICD-10-CM

## 2021-04-13 LAB — BASIC METABOLIC PANEL
BUN: 26 mg/dL — ABNORMAL HIGH (ref 6–23)
CO2: 21 mEq/L (ref 19–32)
Calcium: 9.1 mg/dL (ref 8.4–10.5)
Chloride: 110 mEq/L (ref 96–112)
Creatinine, Ser: 1.49 mg/dL (ref 0.40–1.50)
GFR: 46.54 mL/min — ABNORMAL LOW (ref >60.00)
Glucose, Bld: 119 mg/dL — ABNORMAL HIGH (ref 70–99)
Potassium: 5.9 mEq/L — ABNORMAL HIGH (ref 3.5–5.1)
Sodium: 138 mEq/L (ref 135–145)

## 2021-04-13 NOTE — Progress Notes (Signed)
ATC patient, no answer, left vm to call office.

## 2021-04-14 NOTE — Telephone Encounter (Signed)
Spoke with patient, verbalized understanding about taking lactulose and repeating BMP Monday.

## 2021-04-18 ENCOUNTER — Ambulatory Visit: Payer: Medicare Other | Admitting: Family Medicine

## 2021-04-18 ENCOUNTER — Other Ambulatory Visit (INDEPENDENT_AMBULATORY_CARE_PROVIDER_SITE_OTHER): Payer: BC Managed Care – PPO

## 2021-04-18 ENCOUNTER — Other Ambulatory Visit: Payer: Self-pay

## 2021-04-18 DIAGNOSIS — E875 Hyperkalemia: Secondary | ICD-10-CM

## 2021-04-19 LAB — BASIC METABOLIC PANEL
BUN: 31 mg/dL — ABNORMAL HIGH (ref 6–23)
CO2: 20 mEq/L (ref 19–32)
Calcium: 9.4 mg/dL (ref 8.4–10.5)
Chloride: 111 mEq/L (ref 96–112)
Creatinine, Ser: 1.46 mg/dL (ref 0.40–1.50)
GFR: 47.68 mL/min — ABNORMAL LOW (ref >60.00)
Glucose, Bld: 74 mg/dL (ref 70–99)
Potassium: 4.9 mEq/L (ref 3.5–5.1)
Sodium: 138 mEq/L (ref 135–145)

## 2021-04-19 NOTE — Telephone Encounter (Signed)
Spoke with patient, informed of recent results, verbalized understanding.

## 2021-04-26 ENCOUNTER — Ambulatory Visit: Payer: BC Managed Care – PPO | Attending: Family Medicine

## 2021-04-26 ENCOUNTER — Other Ambulatory Visit: Payer: Self-pay

## 2021-04-26 DIAGNOSIS — R2689 Other abnormalities of gait and mobility: Secondary | ICD-10-CM | POA: Insufficient documentation

## 2021-04-26 DIAGNOSIS — G8929 Other chronic pain: Secondary | ICD-10-CM | POA: Diagnosis not present

## 2021-04-26 DIAGNOSIS — M545 Low back pain, unspecified: Secondary | ICD-10-CM | POA: Insufficient documentation

## 2021-04-26 DIAGNOSIS — R252 Cramp and spasm: Secondary | ICD-10-CM | POA: Diagnosis not present

## 2021-04-26 DIAGNOSIS — M5441 Lumbago with sciatica, right side: Secondary | ICD-10-CM | POA: Insufficient documentation

## 2021-04-26 DIAGNOSIS — M6281 Muscle weakness (generalized): Secondary | ICD-10-CM | POA: Insufficient documentation

## 2021-04-26 NOTE — Therapy (Signed)
[facility] [address], [place], [place], [address] Phone: [phone]   Fax:  [phone]  Physical Therapy Evaluation  Patient Details  Name: [name] MRN: [id number] Date of Birth: 19-Aug-1946 Referring Provider (PT): Grier Mitts, MD   Encounter Date: 04/26/2021   PT End of Session - 04/26/21 1137     Visit Number 1    Date for PT Re-Evaluation 06/21/21    Authorization Type BCBS    Authorization Time Period 20 visit limit    Authorization - Visit Number 1    Authorization - Number of Visits 20    Progress Note Due on Visit 10    PT Start Time 1106    PT Stop Time 1139    PT Time Calculation (min) 33 min    Activity Tolerance Patient tolerated treatment well    Behavior During Therapy [facility] for tasks assessed/performed             Past Medical History:  Diagnosis Date   CAD, ARTERY BYPASS GRAFT 02/10/2008   CAROTID ARTERY STENOSIS, WITHOUT INFARCTION 09/22/2008   CORONARY ARTERY DISEASE 04/03/2008   Diabetes mellitus    "borderline"   ERECTILE DYSFUNCTION 01/14/2009   GOUT 01/16/2008   HERPES ZOSTER 04/15/2010   HYPERLIPIDEMIA 01/16/2008   HYPERTENSION 01/16/2008   IMPAIRED GLUCOSE TOLERANCE 08/22/2010   Shingles 2011   left chest   Ulcer 30 yrs ago   stomach    Past Surgical History:  Procedure Laterality Date   COLONOSCOPY     3 months ago   CORONARY ARTERY BYPASS GRAFT  2009   vessels x3   EAR CYST EXCISION N/A 07/29/2013   Procedure: excision of urachal cyst flexible cystoscopy insertion of foley cath;  Surgeon: Fredricka Bonine, MD;  Location: [facility];  Service: Urology;  Laterality: N/A;   LAPAROSCOPY N/A 07/29/2013   Procedure: LAPAROSCOPY DIAGNOSTIC  laparoscopic excision of urachal cyst ;  Surgeon: Madilyn Hook, DO;  Location: [facility];  Service: General;  Laterality: N/A;   LAPAROTOMY  08/25/2012   Procedure: EXPLORATORY LAPAROTOMY;  Surgeon: Madilyn Hook, DO;  Location: [facility];  Service: General;  Laterality: N/A;  incision and drainage abdominal wall abcessand intra abdominal wall abcess   PARTIAL GASTRECTOMY  1974   bleeding ulcers   urachal  2014   Urachal cyst removal    There were no vitals filed for this visit.    Subjective Assessment - 04/26/21 1109     Subjective Pt presents to PT with Rt sided LBP and buttock pain that began ~6 months ago without incident or injury.  Pt reports that pain has worsened 2-3 weeks ago.    Pertinent History DM, HTN, spinal stenosis    Limitations Walking    How long can you walk comfortably? 10 min max    Diagnostic tests x-ray: severe DDD multilevel    Patient Stated Goals reduce LBP, walk longer    Currently in Pain? Yes    Pain Score 6     Pain Location Back    Pain Orientation Right;Lower    Pain Descriptors / Indicators Stabbing;Sore    Pain Type Chronic pain    Pain Radiating Towards Rt buttock    Pain Onset More than a month ago    Pain Frequency Constant    Aggravating Factors  twisting my body, it is just constant    Pain Relieving Factors Tramadol  [facility] PT Assessment - 04/26/21 0001       Assessment   Medical Diagnosis acute Rt sided LBP without sciatica, OA of the spine with radiculopathy    Referring Provider (PT) Grier Mitts, MD    Onset Date/Surgical Date --   6 months ago   Prior Therapy none      Precautions   Precautions None      Balance Screen   Has the patient fallen in the past 6 months No    Has the patient had a decrease in activity level because of a fear of falling?  No    Is the patient reluctant to leave their home because of a fear of falling?  No      Home Environment   Living Environment Private residence    Type of [place] One level      Prior Function   Level of Independence Independent    Vocation Full time employment    Vocation Requirements [employer]: standing, walk, lifting lighter items    Leisure wife is  disabled so helps with housework      Cognition   Overall Cognitive Status Within Functional Limits for tasks assessed      Observation/Other Assessments   Focus on Therapeutic Outcomes (FOTO)  54 (goal is 73)      Posture/Postural Control   Posture/Postural Control Postural limitations    Postural Limitations Flexed trunk;Forward head      ROM / Strength   AROM / PROM / Strength AROM;Strength      AROM   Overall AROM  Deficits    Overall AROM Comments Lumbar A/ROM limited by 25-50% with Rt lumbar pain with Rt sidebending and rotation.  Hip A/ROM is limited by 50%      Strength   Overall Strength Deficits    Overall Strength Comments Limited functional hip ROM- strength within available ROM is 4-/5 hips and knees      Palpation   Palpation comment localized palpable tenderness over Rt lumbar paraspinals, quadratus and gluteals      Bed Mobility   Bed Mobility Rolling Right;Sit to Supine;Supine to Sit    Rolling Right Contact Guard/Touching assist    Supine to Sit Supervision/Verbal cueing;Contact Guard/Touching assist    Sit to Supine Supervision/Verbal cueing;Contact Guard/Touching assist      Transfers   Transfers Sit to Stand;Stand to Sit    Sit to Stand 6: Modified independent (Device/Increase time);With upper extremity assist    Stand to Sit 6: Modified independent (Device/Increase time);With upper extremity assist      Ambulation/Gait   Ambulation/Gait Yes    Gait Pattern Step-through pattern;Decreased step length - right;Decreased step length - left;Antalgic;Decreased trunk rotation                        Objective measurements completed on examination: See above findings.               PT Education - 04/26/21 1135     Education Details Access Code: [email or web address]    Person(s) Educated Patient    Methods Explanation;Demonstration;Handout    Comprehension Verbalized understanding;Returned demonstration              PT Short Term  Goals - 04/26/21 1113       PT SHORT TERM GOAL #1   Title be independent in initial HEP    Time 4    Period Weeks  Status New    Target Date 05/24/21      PT SHORT TERM GOAL #2   Title report a 25% reduction in frequency and intensity of LBP with standing and walking in the community    Time 4    Period Weeks    Status New    Target Date 05/24/21      PT SHORT TERM GOAL #3   Title tolerate walking for > or = to 15-20 minutes to improve community function    Time 4    Period Weeks    Status New    Target Date 05/24/21               PT Long Term Goals - 04/26/21 1120       PT LONG TERM GOAL #1   Title be independent in advanced HEP    Time 8    Period Weeks    Status New    Target Date 06/21/21      PT LONG TERM GOAL #2   Title reduce FOTO to < or = to 65    Baseline 54    Time 8    Period Weeks    Status New    Target Date 06/21/21      PT LONG TERM GOAL #3   Title report a 60% reduction in the frequency and intensity of LBP with standing and walking tasks    Time 8    Period Weeks    Status New    Target Date 06/21/21      PT LONG TERM GOAL #4   Title verbalize and demonstrate body mechanics modificaitons for lumbar protection for home and work tasks    Time 8    Period Weeks    Status New    Target Date 06/21/21      PT LONG TERM GOAL #5   Title reduce LBP to tolerate walking for > or = to 40 minutes in the community to improve function    Time 8    Period Weeks    Status New    Target Date 06/21/21                    Plan - 04/26/21 1309     Clinical Impression Statement Pt presents to PT with complaints of Rt sided lumbar pain that began ~6 months ago without incident or injury.  Pt describes 6/10 constant, sore and sometimes stabbing pain that extends from low back to Rt gluteals.  Pt is limited to walking 10 minutes max now and had no limits prior to the onset of pain.  X-ray performed in 2021 showed multilevel severe DDD.  Pt  works at [employer] and denies any increase in pain with work tasks as pain remains fairly constant.  Pt with limited lumbar and hip A/ROM and Rt lumbar pain with rotation and sidebending to the Rt. Palpable tenderness over rt quadratus, lumbar paraspinals and gluteals.  Max UE support with sit to stand transition required.  Pt will benefit from skilled PT to address lumbar pain, tissue mobility, hip and core strength and flexibility.    Personal Factors and Comorbidities Comorbidity 3+    Comorbidities HTN, DM, spinal DDD, wife is disabled    Examination-Activity Limitations Bed Mobility;Lift;Locomotion Level    Examination-Participation Restrictions Art gallery manager;Yard Work;Shop    Stability/Clinical Decision Making Stable/Uncomplicated    Clinical Decision Making Low    Rehab Potential Good    PT Frequency  1x / week    PT Duration 8 weeks    PT Treatment/Interventions ADLs/Self Care Home Management;Cryotherapy;Electrical Stimulation;Ultrasound;Moist Heat;Gait training;Stair training;Functional mobility training;Therapeutic activities;Therapeutic exercise;Balance training;Neuromuscular re-education;Manual techniques;Patient/family education;Passive range of motion;Dry needling;Spinal Manipulations;Joint Manipulations;Taping    PT Next Visit Plan review HEP, Nu Step, Addaday to Rt lumbar/gluteals, hip flexiblity    PT Home Exercise Plan Access Code: [email or web address]    Consulted and Agree with Plan of Care Patient             Patient will benefit from skilled therapeutic intervention in order to improve the following deficits and impairments:  Abnormal gait, Decreased activity tolerance, Decreased strength, Postural dysfunction, Improper body mechanics, Impaired flexibility, Pain, Increased muscle spasms, Decreased endurance, Difficulty walking, Decreased range of motion  Visit Diagnosis: Chronic right-sided low back pain with right-sided sciatica - Plan: PT plan of care  cert/re-cert  Cramp and spasm - Plan: PT plan of care cert/re-cert  Muscle weakness (generalized) - Plan: PT plan of care cert/re-cert     Problem List Patient Active Problem List   Diagnosis Date Noted   ETOH abuse 08/10/2020   Nocturia 08/10/2020   Heart burn 08/10/2020   Left hip pain 06/06/2018   DM (diabetes mellitus) type II controlled with renal manifestation ([place]) 12/01/2016   Medicare annual wellness visit, subsequent 12/01/2016   BPH (benign prostatic hyperplasia) 01/28/2015   Type 2 diabetes mellitus with peripheral vascular disease ([place]) 06/20/2013   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 07/26/2012   HERPES ZOSTER 04/15/2010   ERECTILE DYSFUNCTION 01/14/2009   Occlusion and stenosis of carotid artery 09/22/2008   Coronary atherosclerosis 04/03/2008   CAD, ARTERY BYPASS GRAFT 02/10/2008   Dyslipidemia 01/16/2008   GOUT 01/16/2008   Essential hypertension 01/16/2008    Sigurd Sos, PT 04/26/21 1:15 PM  [facility] [address] [place], [place], [address] Phone: [phone]   Fax:  [phone]  Name: SHOLOM DULUDE MRN: [id number] Date of Birth: 21-Aug-1946

## 2021-04-26 NOTE — Patient Instructions (Signed)
Access Code: [email or web address] URL: [email or web address] Date: 04/26/2021 Prepared by: Claiborne Billings  Exercises Supine Lower Trunk Rotation - 3 x daily - 7 x weekly - 1 sets - 3 reps - 3ham hold Seated Hamstring Stretch - 3 x daily - 7 x weekly - 3 sets - 3 reps - 20 hold Seated [place] - 3 x daily - 7 x weekly - 2 sets - 10 reps

## 2021-04-28 DIAGNOSIS — R030 Elevated blood-pressure reading, without diagnosis of hypertension: Secondary | ICD-10-CM | POA: Insufficient documentation

## 2021-04-28 DIAGNOSIS — M47816 Spondylosis without myelopathy or radiculopathy, lumbar region: Secondary | ICD-10-CM | POA: Insufficient documentation

## 2021-04-28 DIAGNOSIS — Z6841 Body Mass Index (BMI) 40.0 and over, adult: Secondary | ICD-10-CM | POA: Insufficient documentation

## 2021-05-02 ENCOUNTER — Ambulatory Visit: Payer: BC Managed Care – PPO | Admitting: Physical Therapy

## 2021-05-02 ENCOUNTER — Encounter: Payer: Self-pay | Admitting: Physical Therapy

## 2021-05-02 ENCOUNTER — Other Ambulatory Visit: Payer: Self-pay

## 2021-05-02 DIAGNOSIS — R2689 Other abnormalities of gait and mobility: Secondary | ICD-10-CM

## 2021-05-02 DIAGNOSIS — R252 Cramp and spasm: Secondary | ICD-10-CM | POA: Diagnosis not present

## 2021-05-02 DIAGNOSIS — M6281 Muscle weakness (generalized): Secondary | ICD-10-CM | POA: Diagnosis not present

## 2021-05-02 DIAGNOSIS — M545 Low back pain, unspecified: Secondary | ICD-10-CM

## 2021-05-02 DIAGNOSIS — G8929 Other chronic pain: Secondary | ICD-10-CM | POA: Diagnosis not present

## 2021-05-02 DIAGNOSIS — M5441 Lumbago with sciatica, right side: Secondary | ICD-10-CM | POA: Diagnosis not present

## 2021-05-02 NOTE — Therapy (Signed)
[facility] [address], [place], [place], [address] Phone: [phone]   Fax:  [phone]  Physical Therapy Treatment  Patient Details  Name: [name] MRN: [id number] Date of Birth: 06-27-1946 Referring Provider (PT): Grier Mitts, MD   Encounter Date: 05/02/2021   PT End of Session - 05/02/21 1011     Visit Number 2    Date for PT Re-Evaluation 06/21/21    Authorization Type BCBS    Authorization Time Period 20 visit limit    Authorization - Visit Number 2    Authorization - Number of Visits 20    Progress Note Due on Visit 10    PT Start Time 6811    PT Stop Time 1050   pt had to use restroom at end of session   PT Time Calculation (min) 35 min    Activity Tolerance Patient tolerated treatment well    Behavior During Therapy [facility] for tasks assessed/performed             Past Medical History:  Diagnosis Date   CAD, ARTERY BYPASS GRAFT 02/10/2008   CAROTID ARTERY STENOSIS, WITHOUT INFARCTION 09/22/2008   CORONARY ARTERY DISEASE 04/03/2008   Diabetes mellitus    "borderline"   ERECTILE DYSFUNCTION 01/14/2009   GOUT 01/16/2008   HERPES ZOSTER 04/15/2010   HYPERLIPIDEMIA 01/16/2008   HYPERTENSION 01/16/2008   IMPAIRED GLUCOSE TOLERANCE 08/22/2010   Shingles 2011   left chest   Ulcer 30 yrs ago   stomach    Past Surgical History:  Procedure Laterality Date   COLONOSCOPY     3 months ago   CORONARY ARTERY BYPASS GRAFT  2009   vessels x3   EAR CYST EXCISION N/A 07/29/2013   Procedure: excision of urachal cyst flexible cystoscopy insertion of foley cath;  Surgeon: Fredricka Bonine, MD;  Location: [facility];  Service: Urology;  Laterality: N/A;   LAPAROSCOPY N/A 07/29/2013   Procedure: LAPAROSCOPY DIAGNOSTIC  laparoscopic excision of urachal cyst ;  Surgeon: Madilyn Hook, DO;  Location: [facility];  Service: General;  Laterality: N/A;   LAPAROTOMY  08/25/2012   Procedure: EXPLORATORY LAPAROTOMY;   Surgeon: Madilyn Hook, DO;  Location: [facility];  Service: General;  Laterality: N/A;  incision and drainage abdominal wall abcessand intra abdominal wall abcess   PARTIAL GASTRECTOMY  1974   bleeding ulcers   urachal  2014   Urachal cyst removal    There were no vitals filed for this visit.   Subjective Assessment - 05/02/21 1014     Subjective Pt reports his back pain has improved and is overall declining and reports he feels "it flares up with lifting more than I should or move too quickly". Pt reports he is being followed by neurology and potentially getting an MRI but unsure.    Pertinent History DM, HTN, spinal stenosis    How long can you walk comfortably? 10 min max    Diagnostic tests x-ray: severe DDD multilevel    Patient Stated Goals reduce LBP, walk longer    Currently in Pain? No/denies    Pain Score 0-No pain                               [facility] Adult PT Treatment/Exercise - 05/02/21 0001       Exercises   Exercises Knee/Hip;Lumbar      Lumbar Exercises: Stretches   Active Hamstring Stretch Left;Right;2 reps;60  seconds    Single Knee to Chest Stretch Left;5 reps;10 seconds    Lower Trunk Rotation Limitations x10 Rt and Lt each      Lumbar Exercises: Aerobic   Nustep 6 mins L1      Lumbar Exercises: Seated   Sit to Stand 10 reps;Other (comment)   no UE support from standard chair. verbal cues for increased core activation and breathing technique     Lumbar Exercises: Sidelying   Clam Both;10 reps;Limitations    Clam Limitations green band      Manual Therapy   Manual Therapy Soft tissue mobilization    Manual therapy comments addagun in sidelying at Rt lower back and gluteals                    PT Education - 05/02/21 1044     Education Details Access Code: [email or web address]. Pt educated on continued use of HEP, use of green theraband for clamshell technique, and breathing technique with proper lifting techniques to decrease pain with  mobility as pt reported pain with lifting    Person(s) Educated Patient    Methods Explanation;Demonstration;Verbal cues;Handout;Tactile cues    Comprehension Verbalized understanding;Returned demonstration              PT Short Term Goals - 04/26/21 1113       PT SHORT TERM GOAL #1   Title be independent in initial HEP    Time 4    Period Weeks    Status New    Target Date 05/24/21      PT SHORT TERM GOAL #2   Title report a 25% reduction in frequency and intensity of LBP with standing and walking in the community    Time 4    Period Weeks    Status New    Target Date 05/24/21      PT SHORT TERM GOAL #3   Title tolerate walking for > or = to 15-20 minutes to improve community function    Time 4    Period Weeks    Status New    Target Date 05/24/21               PT Long Term Goals - 04/26/21 1120       PT LONG TERM GOAL #1   Title be independent in advanced HEP    Time 8    Period Weeks    Status New    Target Date 06/21/21      PT LONG TERM GOAL #2   Title reduce FOTO to < or = to 65    Baseline 54    Time 8    Period Weeks    Status New    Target Date 06/21/21      PT LONG TERM GOAL #3   Title report a 60% reduction in the frequency and intensity of LBP with standing and walking tasks    Time 8    Period Weeks    Status New    Target Date 06/21/21      PT LONG TERM GOAL #4   Title verbalize and demonstrate body mechanics modificaitons for lumbar protection for home and work tasks    Time 8    Period Weeks    Status New    Target Date 06/21/21      PT LONG TERM GOAL #5   Title reduce LBP to tolerate walking for > or = to 40 minutes in the community to improve function  Time 8    Period Weeks    Status New    Target Date 06/21/21                   Plan - 05/02/21 1047     Clinical Impression Statement Pt presenting to clinic with noted improvement in pain overall and reports experiencing pain with quick/sudden movements  and lifting something too heavy. Pt educated on proper breathing technique with lifting and mobility and proper lifting technique. Pt directed in core and hip strenthening and stretching during session and denied pain throughout. Pt reported feeling less muscle tightness post use of Addaday theragun for improved tissure mobility at Rt low back and gluteal region. Pt demonstrated poor sit to stand mechanics without use of hands to ascend from chair and given verbal and tactile cues for increased core activation and breathing techniques, improved with this. Pt also educated on continued need of PT for further improvement in Bil hip strength, core strength, glute strength, trunk and hip mobility, increased tolerance to activity and decreased pain.    Personal Factors and Comorbidities Comorbidity 3+    Comorbidities HTN, DM, spinal DDD, wife is disabled    Examination-Activity Limitations Bed Mobility;Lift;Locomotion Level    Examination-Participation Restrictions Art gallery manager;Yard Work;Shop    Stability/Clinical Decision Making Stable/Uncomplicated    Clinical Decision Making Low    Rehab Potential Good    PT Frequency 1x / week    PT Duration 8 weeks    PT Treatment/Interventions ADLs/Self Care Home Management;Cryotherapy;Electrical Stimulation;Ultrasound;Moist Heat;Gait training;Stair training;Functional mobility training;Therapeutic activities;Therapeutic exercise;Balance training;Neuromuscular re-education;Manual techniques;Patient/family education;Passive range of motion;Dry needling;Spinal Manipulations;Joint Manipulations;Taping    PT Next Visit Plan hip flexibility, core and hip strength, Nustep    PT Home Exercise Plan Access Code: [email or web address]    Consulted and Agree with Plan of Care Patient             Patient will benefit from skilled therapeutic intervention in order to improve the following deficits and impairments:  Abnormal gait, Decreased activity tolerance, Decreased  strength, Postural dysfunction, Improper body mechanics, Impaired flexibility, Pain, Increased muscle spasms, Decreased endurance, Difficulty walking, Decreased range of motion  Visit Diagnosis: Chronic right-sided low back pain without sciatica  Muscle weakness (generalized)  Other abnormalities of gait and mobility     Problem List Patient Active Problem List   Diagnosis Date Noted   ETOH abuse 08/10/2020   Nocturia 08/10/2020   Heart burn 08/10/2020   Left hip pain 06/06/2018   DM (diabetes mellitus) type II controlled with renal manifestation ([place]) 12/01/2016   Medicare annual wellness visit, subsequent 12/01/2016   BPH (benign prostatic hyperplasia) 01/28/2015   Type 2 diabetes mellitus with peripheral vascular disease ([place]) 06/20/2013   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 07/26/2012   HERPES ZOSTER 04/15/2010   ERECTILE DYSFUNCTION 01/14/2009   Occlusion and stenosis of carotid artery 09/22/2008   Coronary atherosclerosis 04/03/2008   CAD, ARTERY BYPASS GRAFT 02/10/2008   Dyslipidemia 01/16/2008   GOUT 01/16/2008   Essential hypertension 01/16/2008   Stacy Gardner, PT 05/02/2209:57 AM   [facility] [address], [place], [place], [address] Phone: [phone]   Fax:  [phone]  Name: [name] MRN: [id number] Date of Birth: 13-Oct-1946

## 2021-05-05 ENCOUNTER — Telehealth: Payer: Self-pay | Admitting: Family Medicine

## 2021-05-05 ENCOUNTER — Other Ambulatory Visit: Payer: Self-pay | Admitting: Neurological Surgery

## 2021-05-05 DIAGNOSIS — M47816 Spondylosis without myelopathy or radiculopathy, lumbar region: Secondary | ICD-10-CM

## 2021-05-05 NOTE — Telephone Encounter (Signed)
Pt is calling in stating that he has started PT and it is helping him and wanted to know if Dr. Volanda Napoleon was wanting to see him back about the lab results or if he don't need to be seen.

## 2021-05-06 NOTE — Telephone Encounter (Signed)
Left message on machine for patient to return our call 

## 2021-05-10 ENCOUNTER — Ambulatory Visit: Payer: BC Managed Care – PPO | Attending: Family Medicine | Admitting: Physical Therapy

## 2021-05-10 ENCOUNTER — Other Ambulatory Visit: Payer: Self-pay

## 2021-05-10 ENCOUNTER — Encounter: Payer: Self-pay | Admitting: Physical Therapy

## 2021-05-10 DIAGNOSIS — M6281 Muscle weakness (generalized): Secondary | ICD-10-CM | POA: Diagnosis not present

## 2021-05-10 DIAGNOSIS — M545 Low back pain, unspecified: Secondary | ICD-10-CM | POA: Insufficient documentation

## 2021-05-10 DIAGNOSIS — G8929 Other chronic pain: Secondary | ICD-10-CM | POA: Diagnosis not present

## 2021-05-10 DIAGNOSIS — R2689 Other abnormalities of gait and mobility: Secondary | ICD-10-CM | POA: Insufficient documentation

## 2021-05-10 NOTE — Therapy (Signed)
[facility] [address], [place], [place], [address] Phone: [phone]   Fax:  [phone]  Physical Therapy Treatment  Patient Details  Name: [name] MRN: [id number] Date of Birth: 05-30-1946 Referring Provider (PT): Grier Mitts, MD   Encounter Date: 05/10/2021   PT End of Session - 05/10/21 1107     Visit Number 3    Date for PT Re-Evaluation 06/21/21    Authorization Type BC[employer]    Authorization Time Period 20 visit limit    Authorization - Visit Number 3    Authorization - Number of Visits 20    Progress Note Due on Visit 10    PT Start Time 1059    PT Stop Time 1140    PT Time Calculation (min) 41 min    Activity Tolerance Patient tolerated treatment well    Behavior During Therapy [facility] for tasks assessed/performed             Past Medical History:  Diagnosis Date   CAD, ARTERY BYPASS GRAFT 02/10/2008   CAROTID ARTERY STENOSIS, WITHOUT INFARCTION 09/22/2008   CORONARY ARTERY DISEASE 04/03/2008   Diabetes mellitus    "borderline"   ERECTILE DYSFUNCTION 01/14/2009   GOUT 01/16/2008   HERPES ZOSTER 04/15/2010   HYPERLIPIDEMIA 01/16/2008   HYPERTENSION 01/16/2008   IMPAIRED GLUCOSE TOLERANCE 08/22/2010   Shingles 2011   left chest   Ulcer 30 yrs ago   stomach    Past Surgical History:  Procedure Laterality Date   COLONOSCOPY     3 months ago   CORONARY ARTERY BYPASS GRAFT  2009   vessels x3   EAR CYST EXCISION N/A 07/29/2013   Procedure: excision of urachal cyst flexible cystoscopy insertion of foley cath;  Surgeon: Fredricka Bonine, MD;  Location: [facility];  Service: Urology;  Laterality: N/A;   LAPAROSCOPY N/A 07/29/2013   Procedure: LAPAROSCOPY DIAGNOSTIC  laparoscopic excision of urachal cyst ;  Surgeon: Madilyn Hook, DO;  Location: [facility];  Service: General;  Laterality: N/A;   LAPAROTOMY  08/25/2012   Procedure: EXPLORATORY LAPAROTOMY;  Surgeon: Madilyn Hook, DO;  Location: [facility];  Service: General;  Laterality: N/A;  incision and drainage abdominal wall abcessand intra abdominal wall abcess   PARTIAL GASTRECTOMY  1974   bleeding ulcers   urachal  2014   Urachal cyst removal    There were no vitals filed for this visit.   Subjective Assessment - 05/10/21 1100     Subjective Pt reports great relief from pain overall and no pain today and declines "flare ups" reported last time that these too have improved.    Pertinent History DM, HTN, spinal stenosis    Limitations Walking    How long can you walk comfortably? 30 mins since last visit without pain    Diagnostic tests x-ray: severe DDD multilevel    Patient Stated Goals reduce LBP, walk longer    Currently in Pain? No/denies                               [facility] Adult PT Treatment/Exercise - 05/10/21 0001       Exercises   Exercises Knee/Hip;Lumbar      Lumbar Exercises: Stretches   Active Hamstring Stretch Left;Right;60 seconds;4 reps    Single Knee to Chest Stretch Left;5 reps;10 seconds;Right    Lower Trunk Rotation Limitations x10 Rt and Lt each  Lumbar Exercises: Aerobic   Nustep 5 mins L2      Lumbar Exercises: Standing   Other Standing Lumbar Exercises lateral lean stretch 4x20s Rt and Lt      Lumbar Exercises: Seated   Sit to Stand 10 reps;Other (comment)   no UE support from standard chair. verbal cues for increased core activation and breathing technique.   Sit to Stand Limitations 5# kettle bell    Other Seated Lumbar Exercises --      Lumbar Exercises: Sidelying   Clam --    Clam Limitations --      Knee/Hip Exercises: Seated   Knee/Hip Flexion 2x10 blue band    Abduction/Adduction  Both;2 sets;10 reps    Abd/Adduction Limitations blue band                    PT Education - 05/10/21 1139     Education Details Access Code: [email or web address]. Pt educated on HEP and addited knees to chest stretch and lateral side bending stretch and educated to have pt  assess any activities he feels he is still having difficulty with to discuss next visit.    Person(s) Educated Patient    Methods Explanation;Demonstration;Tactile cues;Handout;Verbal cues    Comprehension Verbalized understanding;Returned demonstration              PT Short Term Goals - 04/26/21 1113       PT SHORT TERM GOAL #1   Title be independent in initial HEP    Time 4    Period Weeks    Status New    Target Date 05/24/21      PT SHORT TERM GOAL #2   Title report a 25% reduction in frequency and intensity of LBP with standing and walking in the community    Time 4    Period Weeks    Status New    Target Date 05/24/21      PT SHORT TERM GOAL #3   Title tolerate walking for > or = to 15-20 minutes to improve community function    Time 4    Period Weeks    Status New    Target Date 05/24/21               PT Long Term Goals - 04/26/21 1120       PT LONG TERM GOAL #1   Title be independent in advanced HEP    Time 8    Period Weeks    Status New    Target Date 06/21/21      PT LONG TERM GOAL #2   Title reduce FOTO to < or = to 65    Baseline 54    Time 8    Period Weeks    Status New    Target Date 06/21/21      PT LONG TERM GOAL #3   Title report a 60% reduction in the frequency and intensity of LBP with standing and walking tasks    Time 8    Period Weeks    Status New    Target Date 06/21/21      PT LONG TERM GOAL #4   Title verbalize and demonstrate body mechanics modificaitons for lumbar protection for home and work tasks    Time 8    Period Weeks    Status New    Target Date 06/21/21      PT LONG TERM GOAL #5   Title reduce LBP to tolerate walking for >  or = to 40 minutes in the community to improve function    Time 8    Period Weeks    Status New    Target Date 06/21/21                   Plan - 05/10/21 1108     Clinical Impression Statement Pt presents to clinic reported complete relief of pain he orginally  presented for during the past week. Pt reports improved function throughout the day without pain as well. Session emphasized back and hip strengthening and stretching, improved core activation, breathing techniques. Pt would benefit from continued PT for further improved strength, mobility, increased tolerance to activity and continued pain relief.    Personal Factors and Comorbidities Comorbidity 3+    Comorbidities HTN, DM, spinal DDD, wife is disabled    Examination-Activity Limitations Bed Mobility;Lift;Locomotion Level    Examination-Participation Restrictions Art gallery manager;Yard Work;Shop    Stability/Clinical Decision Making Stable/Uncomplicated    Clinical Decision Making Low    Rehab Potential Good    PT Frequency 1x / week    PT Duration 8 weeks    PT Treatment/Interventions ADLs/Self Care Home Management;Cryotherapy;Electrical Stimulation;Ultrasound;Moist Heat;Gait training;Stair training;Functional mobility training;Therapeutic activities;Therapeutic exercise;Balance training;Neuromuscular re-education;Manual techniques;Patient/family education;Passive range of motion;Dry needling;Spinal Manipulations;Joint Manipulations;Taping    PT Next Visit Plan hip flexibility, core and hip strength, Nustep    PT Home Exercise Plan Access Code: [email or web address]    Consulted and Agree with Plan of Care Patient             Patient will benefit from skilled therapeutic intervention in order to improve the following deficits and impairments:  Abnormal gait, Decreased activity tolerance, Decreased strength, Postural dysfunction, Improper body mechanics, Impaired flexibility, Pain, Increased muscle spasms, Decreased endurance, Difficulty walking, Decreased range of motion  Visit Diagnosis: Muscle weakness (generalized)  Other abnormalities of gait and mobility     Problem List Patient Active Problem List   Diagnosis Date Noted   ETOH abuse 08/10/2020   Nocturia 08/10/2020   Heart  burn 08/10/2020   Left hip pain 06/06/2018   DM (diabetes mellitus) type II controlled with renal manifestation ([place]) 12/01/2016   Medicare annual wellness visit, subsequent 12/01/2016   BPH (benign prostatic hyperplasia) 01/28/2015   Type 2 diabetes mellitus with peripheral vascular disease ([place]) 06/20/2013   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 07/26/2012   HERPES ZOSTER 04/15/2010   ERECTILE DYSFUNCTION 01/14/2009   Occlusion and stenosis of carotid artery 09/22/2008   Coronary atherosclerosis 04/03/2008   CAD, ARTERY BYPASS GRAFT 02/10/2008   Dyslipidemia 01/16/2008   GOUT 01/16/2008   Essential hypertension 01/16/2008    [name], PT 05/10/2210:42 AM   [facility] [address] [place], [place], [address] Phone: [phone]   Fax:  [phone]  Name: SKYY MCKNIGHT MRN: [id number] Date of Birth: 05-01-1946

## 2021-05-10 NOTE — Patient Instructions (Signed)
Access Code: [email or web address] URL: [email or web address] Date: 05/10/2021 Prepared by: Stacy Gardner  Exercises Supine Lower Trunk Rotation - 3 x daily - 7 x weekly - 1 sets - 3 reps - 3ham hold Seated Hamstring Stretch - 3 x daily - 7 x weekly - 3 sets - 3 reps - 20 hold Seated March - 3 x daily - 7 x weekly - 2 sets - 10 reps Clamshell with Resistance - 1 x daily - 7 x weekly - 3 sets - 10 reps Seated Sidebending - 1 x daily - 7 x weekly - 3 sets - 10 reps Supine Single Knee to Chest Stretch - 1 x daily - 7 x weekly - 3 sets - 10 reps

## 2021-05-11 ENCOUNTER — Other Ambulatory Visit: Payer: Self-pay | Admitting: Family Medicine

## 2021-05-16 NOTE — Telephone Encounter (Signed)
Patient has appointment 7/14

## 2021-05-17 ENCOUNTER — Other Ambulatory Visit: Payer: Self-pay

## 2021-05-17 ENCOUNTER — Ambulatory Visit: Payer: BC Managed Care – PPO

## 2021-05-17 DIAGNOSIS — R2689 Other abnormalities of gait and mobility: Secondary | ICD-10-CM

## 2021-05-17 DIAGNOSIS — G8929 Other chronic pain: Secondary | ICD-10-CM

## 2021-05-17 DIAGNOSIS — M545 Low back pain, unspecified: Secondary | ICD-10-CM | POA: Diagnosis not present

## 2021-05-17 DIAGNOSIS — M6281 Muscle weakness (generalized): Secondary | ICD-10-CM

## 2021-05-17 NOTE — Therapy (Signed)
[facility] [address] [place], [place], [address] Phone: [phone]   Fax:  [phone]  Physical Therapy Treatment  Patient Details  Name: [name] MRN: [id number] Date of Birth: [birth year] Referring Provider (PT): Grier Mitts, MD   Encounter Date: 05/17/2021   PT End of Session - 05/17/21 1129     Visit Number 4    Date for PT Re-Evaluation 06/21/21    Authorization - Visit Number 4    Authorization - Number of Visits 20    PT Start Time 1100    PT Stop Time 7564    PT Time Calculation (min) 27 min    Activity Tolerance Patient tolerated treatment well    Behavior During Therapy [facility] for tasks assessed/performed             Past Medical History:  Diagnosis Date   CAD, ARTERY BYPASS GRAFT 02/10/2008   CAROTID ARTERY STENOSIS, WITHOUT INFARCTION 09/22/2008   CORONARY ARTERY DISEASE 04/03/2008   Diabetes mellitus    "borderline"   ERECTILE DYSFUNCTION 01/14/2009   GOUT 01/16/2008   HERPES ZOSTER 04/15/2010   HYPERLIPIDEMIA 01/16/2008   HYPERTENSION 01/16/2008   IMPAIRED GLUCOSE TOLERANCE 08/22/2010   Shingles 2011   left chest   Ulcer 30 yrs ago   stomach    Past Surgical History:  Procedure Laterality Date   COLONOSCOPY     3 months ago   CORONARY ARTERY BYPASS GRAFT  2009   vessels x3   EAR CYST EXCISION N/A 07/29/2013   Procedure: excision of urachal cyst flexible cystoscopy insertion of foley cath;  Surgeon: Fredricka Bonine, MD;  Location: [facility];  Service: Urology;  Laterality: N/A;   LAPAROSCOPY N/A 07/29/2013   Procedure: LAPAROSCOPY DIAGNOSTIC  laparoscopic excision of urachal cyst ;  Surgeon: Madilyn Hook, DO;  [facility]: [facility];  Service: General;  Laterality: N/A;   LAPAROTOMY  08/25/2012   Procedure: EXPLORATORY LAPAROTOMY;  Surgeon: Madilyn Hook, DO;  Location: [facility];  Service: General;  Laterality: N/A;  incision and drainage abdominal wall abcessand intra abdominal  wall abcess   PARTIAL GASTRECTOMY  1974   bleeding ulcers   urachal  2014   Urachal cyst removal    There were no vitals filed for this visit.   Subjective Assessment - 05/17/21 1105     Subjective I have no pain now.  The stretches have helped.    Currently in Pain? No/denies                [facility] PT Assessment - 05/17/21 0001       Assessment   Medical Diagnosis acute Rt sided LBP without sciatica, OA of the spine with radiculopathy    Referring Provider (PT) Grier Mitts, MD      Observation/Other Assessments   Focus on Therapeutic Outcomes (FOTO)  49                           [place] Adult PT Treatment/Exercise - 05/17/21 0001       Lumbar Exercises: Aerobic   Nustep 5 mins L2   PT present to discuss progress with pt.                   PT Education - 05/17/21 1108     Education Details body mechanics education, review of all HEP with visuals    Person(s) Educated Patient    Methods Explanation;Handout  Comprehension Verbalized understanding;Returned demonstration              PT Short Term Goals - 04/26/21 1113       PT SHORT TERM GOAL #1   Title be independent in initial HEP    Time 4    Period Weeks    Status New    Target Date 05/24/21      PT SHORT TERM GOAL #2   Title report a 25% reduction in frequency and intensity of LBP with standing and walking in the community    Time 4    Period Weeks    Status New    Target Date 05/24/21      PT SHORT TERM GOAL #3   Title tolerate walking for > or = to 15-20 minutes to improve community function    Time 4    Period Weeks    Status New    Target Date 05/24/21               PT Long Term Goals - 05/17/21 1105       PT LONG TERM GOAL #1   Title be independent in advanced HEP    Status Achieved      PT LONG TERM GOAL #2   Title reduce FOTO to < or = to 65    Baseline 75    Status Achieved      PT LONG TERM GOAL #3   Title report a 60% reduction in  the frequency and intensity of LBP with standing and walking tasks    Baseline 100%    Status Achieved      PT LONG TERM GOAL #4   Title verbalize and demonstrate body mechanics modificaitons for lumbar protection for home and work tasks    Status Achieved      PT LONG TERM GOAL #5   Title reduce LBP to tolerate walking for > or = to 40 minutes in the community to improve function    Baseline 30-40 min    Status Achieved                   Plan - 05/17/21 1128     Clinical Impression Statement Pt arrived with no pain and reports 100% overall improvement since the start of care.  Pt continues to perform HEP and PT provided verbal review of all HEP today.  PT also provided pt with body mechanics education to reduce lumbar strain with home and work tasks.  Pt verbalized and demonstrated understanding of all modifications.  FOTO is improved to 75 and pt denies any limitations related to LBP today.  Pt will D/C to HEP today.    PT Next Visit Plan D/C PT to HEP    PT Home Exercise Plan Access Code: [email or web address]    Recommended Other Services initial cert is signed             Patient will benefit from skilled therapeutic intervention in order to improve the following deficits and impairments:     Visit Diagnosis: Muscle weakness (generalized)  Other abnormalities of gait and mobility  Chronic right-sided low back pain without sciatica     Problem List Patient Active Problem List   Diagnosis Date Noted   ETOH abuse 08/10/2020   Nocturia 08/10/2020   Heart burn 08/10/2020   Left hip pain 06/06/2018   DM (diabetes mellitus) type II controlled with renal manifestation ([place]) 12/01/2016   Medicare annual wellness visit, subsequent 12/01/2016  BPH (benign prostatic hyperplasia) 01/28/2015   Type 2 diabetes mellitus with peripheral vascular disease ([place]) 06/20/2013   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     Remaining deficits: See above.  Pt denies any remaining deficits.    Education / Equipment: HEP, posture/mechanics    Patient agrees to discharge. Patient goals were met. Patient is being discharged due to meeting the stated rehab goals.  Sigurd Sos, PT 05/17/21 11:30 AM   [facility] [address], [place], [place], [address] Phone: [phone]   Fax:  [phone]  Name: [name] MRN: [id number] Date of Birth: 02/19/1946

## 2021-05-17 NOTE — Patient Instructions (Signed)
   Lifting Principles  Maintain proper posture and head alignment. Slide object as close as possible before lifting. Move obstacles out of the way. Test before lifting; ask for help if too heavy. Tighten stomach muscles without holding breath. Use smooth movements; do not jerk. Use legs to do the work, and pivot with feet. Distribute the work load symmetrically and close to the center of trunk. Push instead of pull whenever possible.   Squat down and hold basket close to stand. Use leg muscles to do the work.    Avoid twisting or bending back. Pivot around using foot movements, and bend at knees if needed when reaching for articles.        Getting Into / Out of Bed   Lower self to lie down on one side by raising legs and lowering head at the same time. Use arms to assist moving without twisting. Bend both knees to roll onto back if desired. To sit up, start from lying on side, and use same move-ments in reverse. Keep trunk aligned with legs.    Shift weight from front foot to back foot as item is lifted off shelf.    When leaning forward to pick object up from floor, extend one leg out behind. Keep back straight. Hold onto a sturdy support with other hand.      Sit upright, head facing forward. Try using a roll to support lower back. Keep shoulders relaxed, and avoid rounded back. Keep hips level with knees. Avoid crossing legs for long periods.     [facility] [address], [place], [place] [address] Phone # [phone] Fax [phone]

## 2021-05-18 ENCOUNTER — Other Ambulatory Visit: Payer: Self-pay

## 2021-05-19 ENCOUNTER — Ambulatory Visit (INDEPENDENT_AMBULATORY_CARE_PROVIDER_SITE_OTHER): Payer: BC Managed Care – PPO | Admitting: Family Medicine

## 2021-05-19 ENCOUNTER — Encounter: Payer: Self-pay | Admitting: Family Medicine

## 2021-05-19 VITALS — BP 130/70 | HR 69 | Temp 98.3°F | Wt 298.0 lb

## 2021-05-19 DIAGNOSIS — R351 Nocturia: Secondary | ICD-10-CM | POA: Diagnosis not present

## 2021-05-19 DIAGNOSIS — E1151 Type 2 diabetes mellitus with diabetic peripheral angiopathy without gangrene: Secondary | ICD-10-CM

## 2021-05-19 DIAGNOSIS — M25551 Pain in right hip: Secondary | ICD-10-CM

## 2021-05-19 DIAGNOSIS — M545 Low back pain, unspecified: Secondary | ICD-10-CM

## 2021-05-19 DIAGNOSIS — E875 Hyperkalemia: Secondary | ICD-10-CM | POA: Diagnosis not present

## 2021-05-19 DIAGNOSIS — M25552 Pain in left hip: Secondary | ICD-10-CM | POA: Diagnosis not present

## 2021-05-19 DIAGNOSIS — N529 Male erectile dysfunction, unspecified: Secondary | ICD-10-CM | POA: Diagnosis not present

## 2021-05-19 DIAGNOSIS — N1831 Chronic kidney disease, stage 3a: Secondary | ICD-10-CM | POA: Diagnosis not present

## 2021-05-19 LAB — CBC WITH DIFFERENTIAL/PLATELET
Basophils Absolute: 0.1 10*3/uL (ref 0.0–0.1)
Basophils Relative: 1 % (ref 0.0–3.0)
Eosinophils Absolute: 0.1 10*3/uL (ref 0.0–0.7)
Eosinophils Relative: 2.2 % (ref 0.0–5.0)
HCT: 32.8 % — ABNORMAL LOW (ref 39.0–52.0)
Hemoglobin: 11 g/dL — ABNORMAL LOW (ref 13.0–17.0)
Lymphocytes Relative: 25.1 % (ref 12.0–46.0)
Lymphs Abs: 1.6 10*3/uL (ref 0.7–4.0)
MCHC: 33.4 g/dL (ref 30.0–36.0)
MCV: 97.3 fl (ref 78.0–100.0)
Monocytes Absolute: 0.7 10*3/uL (ref 0.1–1.0)
Monocytes Relative: 11.2 % (ref 3.0–12.0)
Neutro Abs: 3.7 10*3/uL (ref 1.4–7.7)
Neutrophils Relative %: 60.5 % (ref 43.0–77.0)
Platelets: 208 10*3/uL (ref 150.0–400.0)
RBC: 3.37 Mil/uL — ABNORMAL LOW (ref 4.22–5.81)
RDW: 14.3 % (ref 11.5–15.5)
WBC: 6.2 10*3/uL (ref 4.0–10.5)

## 2021-05-19 LAB — BASIC METABOLIC PANEL
BUN: 33 mg/dL — ABNORMAL HIGH (ref 6–23)
CO2: 22 mEq/L (ref 19–32)
Calcium: 9 mg/dL (ref 8.4–10.5)
Chloride: 110 mEq/L (ref 96–112)
Creatinine, Ser: 1.69 mg/dL — ABNORMAL HIGH (ref 0.40–1.50)
GFR: 39.98 mL/min — ABNORMAL LOW (ref >60.00)
Glucose, Bld: 121 mg/dL — ABNORMAL HIGH (ref 70–99)
Potassium: 5.8 mEq/L — ABNORMAL HIGH (ref 3.5–5.1)
Sodium: 138 mEq/L (ref 135–145)

## 2021-05-19 MED ORDER — SILDENAFIL CITRATE 25 MG PO TABS: 25.0000 mg | ORAL_TABLET | Freq: Every day | ORAL | 1 refills | Status: DC | PRN

## 2021-05-19 MED ORDER — TAMSULOSIN HCL 0.4 MG PO CAPS: 0.8000 mg | ORAL_CAPSULE | Freq: Every day | ORAL | 3 refills | Status: DC

## 2021-05-19 NOTE — Progress Notes (Signed)
Subjective:    Patient ID: [name], male    DOB: June 01, 1946, 75 y.o.   MRN: [id number]  Chief Complaint  Patient presents with   Abnormal Lab    hyperkalemia     HPI Patient was seen today for f/u on hyperkalemia.  K was 6.5 on 04/12/21 and returned to normal 4.9 on 04/18/21 after lactulose.  States doing well.  Pt d/c'd from PT after improvement in R sided low back pain.  Pt has exercises to do at home. Inquires about having MRI since back has improved.  Pt notes occasional b/l hip pain after walking a little over 100 ft.  Has to stop 2/2 the discomfort.  Inquires about a handicap placard.  Pt states fsbs has been so so.  Unable to achieve and erection.  Inquires about viagra.  Having nocturia, up q hr.   Taking Flomax 0.4 mg.  Denies HAs, changes in vision, CP, SOB, LE edema, myalgias.  Past Medical History:  Diagnosis Date   CAD, ARTERY BYPASS GRAFT 02/10/2008   CAROTID ARTERY STENOSIS, WITHOUT INFARCTION 09/22/2008   CORONARY ARTERY DISEASE 04/03/2008   Diabetes mellitus    "borderline"   ERECTILE DYSFUNCTION 01/14/2009   GOUT 01/16/2008   HERPES ZOSTER 04/15/2010   HYPERLIPIDEMIA 01/16/2008   HYPERTENSION 01/16/2008   IMPAIRED GLUCOSE TOLERANCE 08/22/2010   Shingles 2011   left chest   Ulcer 30 yrs ago   stomach    No Known Allergies  ROS General: Denies fever, chills, night sweats, changes in weight, changes in appetite HEENT: Denies headaches, ear pain, changes in vision, rhinorrhea, sore throat CV: Denies CP, palpitations, SOB, orthopnea Pulm: Denies SOB, cough, wheezing GI: Denies abdominal pain, nausea, vomiting, diarrhea, constipation GU: Denies dysuria, hematuria, frequency +nocturia, ED Msk: Denies muscle cramps, joint pains +b/l hip pain Neuro: Denies weakness, numbness, tingling Skin: Denies rashes, bruising Psych: Denies depression, anxiety, hallucinations    Objective:    Blood pressure 130/70, pulse 69, temperature 98.3 F (36.8 C), temperature source  Oral, weight 298 lb (135.2 kg), SpO2 94 %.  Gen. Pleasant, well-nourished, in no distress, normal affect   HEENT: [place]/AT, face symmetric, conjunctiva clear, no scleral icterus, PERRLA, EOMI, nares patent without drainage Lungs: no accessory muscle use, CTAB, no wheezes or rales Cardiovascular: RRR, no m/r/g, no peripheral edema Musculoskeletal: L 5th digit flexed.  No cyanosis or clubbing, normal tone Neuro:  A&Ox3, CN II-XII intact, normal gait Skin:  Warm, no lesions/ rash   Wt Readings from Last 3 Encounters:  05/19/21 298 lb (135.2 kg)  04/11/21 292 lb 3.2 oz (132.5 kg)  10/11/20 295 lb 6.4 oz (134 kg)    Lab Results  Component Value Date   WBC 5.3 04/11/2021   HGB 11.2 (L) 04/11/2021   HCT 34.0 (L) 04/11/2021   PLT 203.0 04/11/2021   GLUCOSE 74 04/18/2021   CHOL 164 04/08/2020   TRIG 58.0 04/08/2020   HDL 67.10 04/08/2020   LDLDIRECT 130.4 11/06/2011   LDLCALC 86 04/08/2020   ALT 29 08/05/2020   AST 30 08/05/2020   NA 138 04/18/2021   K 4.9 04/18/2021   CL 111 04/18/2021   CREATININE 1.46 04/18/2021   BUN 31 (H) 04/18/2021   CO2 20 04/18/2021   TSH 2.31 07/21/2016   PSA 0.17 08/05/2020   INR 1.5 02/10/2008   HGBA1C 5.6 04/11/2021   MICROALBUR 75.9 08/18/2020    Assessment/Plan:  Hyperkalemia  -asymptomatic -previously 6.1 on 04/11/21, 6.5 on 04/12/21 and 4.9 on 04/18/21  after lactulose. -recheck K+  - Plan: Basic metabolic panel  Bilateral hip pain  - Plan: CBC with Differential/Platelet  Acute right-sided low back pain without sciatica -continue exercises from PT -continue to monitor  Erectile dysfunction, unspecified erectile dysfunction type  -advised on importance of glycemic control -Advised not to use SL NTG and Viagra concomitantly. - Plan: sildenafil (VIAGRA) 25 MG tablet  Nocturia -We will increase Flomax from 0.4 mg to 0.8 mg - Plan: tamsulosin (FLOMAX) 0.4 MG CAPS capsule  Type 2 diabetes mellitus with peripheral vascular disease  (HCC) -Continue lifestyle modifications -Continue current medications including glipizide XL 5 mg daily  Stage 3a chronic kidney disease (HCC)  -Stable -Avoid nephrotoxic meds and renally dose medications. - Plan: Basic metabolic panel  F/u in 1 month, sooner if needed  Grier Mitts, MD

## 2021-05-20 ENCOUNTER — Other Ambulatory Visit: Payer: Self-pay | Admitting: Family Medicine

## 2021-05-20 DIAGNOSIS — E875 Hyperkalemia: Secondary | ICD-10-CM

## 2021-05-20 DIAGNOSIS — I10 Essential (primary) hypertension: Secondary | ICD-10-CM

## 2021-05-20 DIAGNOSIS — N1832 Chronic kidney disease, stage 3b: Secondary | ICD-10-CM

## 2021-05-20 MED ORDER — LISINOPRIL 40 MG PO TABS: 40.0000 mg | ORAL_TABLET | Freq: Every day | ORAL | 3 refills | Status: DC

## 2021-05-20 MED ORDER — LACTULOSE 10 GM/15ML PO SOLN: 10.0000 g | Freq: Once | ORAL | 0 refills | Status: AC

## 2021-05-22 ENCOUNTER — Other Ambulatory Visit: Payer: Self-pay | Admitting: Family Medicine

## 2021-05-22 DIAGNOSIS — M4726 Other spondylosis with radiculopathy, lumbar region: Secondary | ICD-10-CM

## 2021-05-24 ENCOUNTER — Other Ambulatory Visit: Payer: Medicare Other

## 2021-05-24 ENCOUNTER — Other Ambulatory Visit: Payer: Self-pay

## 2021-05-24 ENCOUNTER — Telehealth: Payer: Self-pay | Admitting: Family Medicine

## 2021-05-24 ENCOUNTER — Other Ambulatory Visit (INDEPENDENT_AMBULATORY_CARE_PROVIDER_SITE_OTHER): Payer: BC Managed Care – PPO

## 2021-05-24 DIAGNOSIS — D631 Anemia in chronic kidney disease: Secondary | ICD-10-CM | POA: Diagnosis not present

## 2021-05-24 DIAGNOSIS — I10 Essential (primary) hypertension: Secondary | ICD-10-CM

## 2021-05-24 DIAGNOSIS — N1832 Chronic kidney disease, stage 3b: Secondary | ICD-10-CM

## 2021-05-24 LAB — CBC WITH DIFFERENTIAL/PLATELET
Basophils Absolute: 0 10*3/uL (ref 0.0–0.1)
Basophils Relative: 0.8 % (ref 0.0–3.0)
Eosinophils Absolute: 0.1 10*3/uL (ref 0.0–0.7)
Eosinophils Relative: 2.4 % (ref 0.0–5.0)
HCT: 35.2 % — ABNORMAL LOW (ref 39.0–52.0)
Hemoglobin: 11.7 g/dL — ABNORMAL LOW (ref 13.0–17.0)
Lymphocytes Relative: 31.3 % (ref 12.0–46.0)
Lymphs Abs: 1.8 10*3/uL (ref 0.7–4.0)
MCHC: 33.2 g/dL (ref 30.0–36.0)
MCV: 96.2 fl (ref 78.0–100.0)
Monocytes Absolute: 0.6 10*3/uL (ref 0.1–1.0)
Monocytes Relative: 10.8 % (ref 3.0–12.0)
Neutro Abs: 3.2 10*3/uL (ref 1.4–7.7)
Neutrophils Relative %: 54.7 % (ref 43.0–77.0)
Platelets: 195 10*3/uL (ref 150.0–400.0)
RBC: 3.65 Mil/uL — ABNORMAL LOW (ref 4.22–5.81)
RDW: 14.5 % (ref 11.5–15.5)
WBC: 5.9 10*3/uL (ref 4.0–10.5)

## 2021-05-24 LAB — BASIC METABOLIC PANEL
BUN: 24 mg/dL — ABNORMAL HIGH (ref 6–23)
CO2: 21 mEq/L (ref 19–32)
Calcium: 9.2 mg/dL (ref 8.4–10.5)
Chloride: 109 mEq/L (ref 96–112)
Creatinine, Ser: 1.24 mg/dL (ref 0.40–1.50)
GFR: 57.97 mL/min — ABNORMAL LOW (ref >60.00)
Glucose, Bld: 116 mg/dL — ABNORMAL HIGH (ref 70–99)
Potassium: 5 mEq/L (ref 3.5–5.1)
Sodium: 139 mEq/L (ref 135–145)

## 2021-05-24 LAB — VITAMIN D 25 HYDROXY (VIT D DEFICIENCY, FRACTURES): VITD: 9.68 ng/mL — ABNORMAL LOW (ref 30.00–100.00)

## 2021-05-24 NOTE — Telephone Encounter (Signed)
Patient called back, we went over the information below & he verbalized understanding.

## 2021-05-24 NOTE — Telephone Encounter (Signed)
I left patient a voicemail to call the office back. Metoprolol was d/c'd and Lisinopril was increased to 40 mg. Amlodipine stayed the same.

## 2021-05-24 NOTE — Telephone Encounter (Signed)
[name] called pt yesterday about adjusting medications, but he can't remember how it was he was supposed to adjust.

## 2021-05-27 ENCOUNTER — Other Ambulatory Visit: Payer: Self-pay

## 2021-05-27 ENCOUNTER — Other Ambulatory Visit: Payer: Self-pay | Admitting: Family Medicine

## 2021-05-27 DIAGNOSIS — R351 Nocturia: Secondary | ICD-10-CM

## 2021-05-27 DIAGNOSIS — E875 Hyperkalemia: Secondary | ICD-10-CM

## 2021-05-31 ENCOUNTER — Encounter: Payer: Medicare Other | Admitting: Physical Therapy

## 2021-06-06 ENCOUNTER — Telehealth: Payer: Self-pay | Admitting: Family Medicine

## 2021-06-06 NOTE — Telephone Encounter (Signed)
Left message for patient to call back and schedule Medicare Annual Wellness Visit (AWV) either virtually or in office.   Last AWV ;04/08/20  please schedule at anytime with [facility] Nurse Health Advisor 1 or 2   This should be a 45 minute visit.

## 2021-06-07 ENCOUNTER — Encounter: Payer: Medicare Other | Admitting: Physical Therapy

## 2021-06-16 ENCOUNTER — Ambulatory Visit: Payer: Medicare Other

## 2021-06-24 ENCOUNTER — Telehealth: Payer: Self-pay

## 2021-06-24 NOTE — Progress Notes (Deleted)
Subjective:   [name] is a 75 y.o. male who presents for an Initial Medicare Annual Wellness Visit.   I connected with [name] today by telephone and verified that I am speaking with the correct person using two identifiers. Location patient: home Location provider: work Persons participating in the virtual visit: patient, provider.   I discussed the limitations, risks, security and privacy concerns of performing an evaluation and management service by telephone and the availability of in person appointments. I also discussed with the patient that there may be a patient responsible charge related to this service. The patient expressed understanding and verbally consented to this telephonic visit.    Interactive audio and video telecommunications were attempted between this provider and patient, however failed, due to patient having technical difficulties OR patient did not have access to video capability.  We continued and completed visit with audio only.    Review of Systems    N/a       Objective:    There were no vitals filed for this visit. There is no height or weight on file to calculate BMI.  Advanced Directives 04/26/2021 07/30/2013 07/22/2013 08/25/2012 08/21/2012  Does Patient Have a Medical Advance Directive? No Patient does not have advance directive;Patient would not like information Patient does not have advance directive;Patient would not like information Patient does not have advance directive Patient does not have advance directive  Would patient like information on creating a medical advance directive? No - Patient declined - - - -  Pre-existing out of facility DNR order (yellow form or pink MOST form) - No No No No    Current Medications (verified) Outpatient Encounter Medications as of 06/24/2021  Medication Sig   allopurinol (ZYLOPRIM) 100 MG tablet TAKE 1 TABLET BY MOUTH  DAILY   amLODipine (NORVASC) 10 MG tablet TAKE 1 TABLET BY MOUTH  DAILY    aspirin EC 325 MG tablet Take 325 mg by mouth every morning.  (Patient not taking: Reported on 05/19/2021)   atorvastatin (LIPITOR) 80 MG tablet TAKE 1 TABLET BY MOUTH EVERY MORNING.   Cholecalciferol (VITAMIN D3) 1000 UNITS CAPS Take 1 capsule by mouth daily.   colchicine 0.6 MG tablet Take 2 tabs (1.'2mg'$ ) at fist sign of gout flare.  Then take 1 tab 1 hour later.  On day 2 take one tab daily until flare stops.   Continuous Blood Gluc Receiver (FREESTYLE LIBRE 14 DAY READER) DEVI Use as directed   Continuous Blood Gluc Sensor (FREESTYLE LIBRE SENSOR SYSTEM) MISC Apply device as directed every 2 weeks.   gabapentin (NEURONTIN) 100 MG capsule TAKE 1 CAPSULE BY MOUTH EVERYDAY AT BEDTIME   glipiZIDE (GLUCOTROL XL) 5 MG 24 hr tablet TAKE 1 TABLET BY MOUTH DAILY BEFORE BREAKFAST.   glucose blood (ONE TOUCH ULTRA TEST) test strip 1 each by Other route daily. Use as instructed   ibuprofen (ADVIL,MOTRIN) 200 MG tablet Take 400 mg by mouth every 6 (six) hours as needed. For pain   Lancets (ONETOUCH ULTRASOFT) lancets 1 each by Other route daily. Use as instructed   lisinopril (ZESTRIL) 40 MG tablet Take 1 tablet (40 mg total) by mouth daily.   meloxicam (MOBIC) 7.5 MG tablet Take 1 tablet (7.5 mg total) by mouth daily.   nitroGLYCERIN (NITROSTAT) 0.4 MG SL tablet Place 1 tablet (0.4 mg total) under the tongue every 5 (five) minutes as needed for chest pain.   Omega 3-6-9 Fatty Acids (OMEGA-3 & OMEGA-6 FISH OIL PO) Take 1,000  mg by mouth daily.    sildenafil (VIAGRA) 25 MG tablet Take 1 tablet (25 mg total) by mouth daily as needed for erectile dysfunction.   tamsulosin (FLOMAX) 0.4 MG CAPS capsule Take 2 capsules (0.8 mg total) by mouth daily.   traMADol (ULTRAM) 50 MG tablet TAKE 2 TABLETS (100 MG TOTAL) BY MOUTH EVERY 12 (TWELVE) HOURS AS NEEDED.   No facility-administered encounter medications on file as of 06/24/2021.    Allergies (verified) Patient has no known allergies.   History: Past Medical  History:  Diagnosis Date   CAD, ARTERY BYPASS GRAFT 02/10/2008   CAROTID ARTERY STENOSIS, WITHOUT INFARCTION 09/22/2008   CORONARY ARTERY DISEASE 04/03/2008   Diabetes mellitus    "borderline"   ERECTILE DYSFUNCTION 01/14/2009   GOUT 01/16/2008   HERPES ZOSTER 04/15/2010   HYPERLIPIDEMIA 01/16/2008   HYPERTENSION 01/16/2008   IMPAIRED GLUCOSE TOLERANCE 08/22/2010   Shingles 2011   left chest   Ulcer 30 yrs ago   stomach   Past Surgical History:  Procedure Laterality Date   COLONOSCOPY     3 months ago   CORONARY ARTERY BYPASS GRAFT  2009   vessels x3   EAR CYST EXCISION N/A 07/29/2013   Procedure: excision of urachal cyst flexible cystoscopy insertion of foley cath;  Surgeon: Fredricka Bonine, MD;  Location: [facility];  Service: Urology;  Laterality: N/A;   LAPAROSCOPY N/A 07/29/2013   Procedure: LAPAROSCOPY DIAGNOSTIC  laparoscopic excision of urachal cyst ;  Surgeon: Madilyn Hook, DO;  Location: [facility];  Service: General;  Laterality: N/A;   LAPAROTOMY  08/25/2012   Procedure: EXPLORATORY LAPAROTOMY;  Surgeon: Madilyn Hook, DO;  Location: [facility];  Service: General;  Laterality: N/A;  incision and drainage abdominal wall abcessand intra abdominal wall abcess   PARTIAL GASTRECTOMY  1974   bleeding ulcers   urachal  2014   Urachal cyst removal   Family History  Problem Relation Age of Onset   Heart disease Mother    Heart disease Sister    Colon cancer Neg Hx    Social History   Socioeconomic History   Marital status: Married    Spouse name: Not on file   Number of children: 3   Years of education: Not on file   Highest education level: Not on file  Occupational History    Employer: [employer]  Tobacco Use   Smoking status: Former    Packs/day: 1.00    Years: 10.00    Pack years: 10.00    Types: Cigarettes    Quit date: 07/27/1999    Years since quitting: 21.9   Smokeless tobacco: Never  Substance and Sexual Activity   Alcohol use: Yes    Comment: 4 beers a  day   Drug use: No   Sexual activity: Not on file  Other Topics Concern   Not on file  Social History Narrative   epworth sleepiness scale score: 4   Social Determinants of Health   Financial Resource Strain: Not on file  Food Insecurity: Not on file  Transportation Needs: Not on file  Physical Activity: Not on file  Stress: Not on file  Social Connections: Not on file    Tobacco Counseling Counseling given: Not Answered   Clinical Intake:                 Diabetic?***         Activities of Daily Living No flowsheet data found.  Patient Care Team: [name],  MD as PCP - General (Family Medicine) [name], [place] as Referring Physician (Optometry)  Indicate any recent Medical Services you may have received from other than [facility] providers in the past year (date may be approximate).     Assessment:   This is a routine wellness examination for [name].  Hearing/Vision screen No results found.  Dietary issues and exercise activities discussed:     Goals Addressed   None    Depression Screen PHQ 2/9 Scores 04/08/2020 12/01/2016 07/01/2015 01/28/2015 12/19/2013 09/19/2013  PHQ - 2 Score 0 0 0 0 0 0    Fall Risk Fall Risk  04/08/2020 12/01/2016 07/01/2015 01/28/2015 12/19/2013  Falls in the past year? 0 No No No No    FALL RISK PREVENTION PERTAINING TO THE HOME:  Any stairs in or around the home? {YES/NO:21197} If so, are there any without handrails? {YES/NO:21197} Home free of loose throw rugs in walkways, pet beds, electrical cords, etc? {YES/NO:21197} Adequate lighting in your home to reduce risk of falls? {YES/NO:21197}  ASSISTIVE DEVICES UTILIZED TO PREVENT FALLS:  Life alert? {YES/NO:21197} Use of a cane, walker or w/c? {YES/NO:21197} Grab bars in the bathroom? {YES/NO:21197} Shower chair or bench in shower? {YES/NO:21197} Elevated toilet seat or a handicapped toilet? {YES/NO:21197}  TIMED UP AND GO:  Was the test performed?  {YES/NO:21197}.  Length of time to ambulate 10 feet: *** sec.   {Appearance of [id number]  Cognitive Function:        Immunizations Immunization History  Administered Date(s) Administered   Fluad Quad(high Dose 65+) 08/05/2020   Influenza Split 08/27/2012   Influenza Whole 08/22/2010   Influenza, High Dose Seasonal PF 11/12/2014, 07/21/2016, 08/27/2017, 10/10/2018, 08/04/2019   Influenza,inj,Quad PF,6+ Mos 07/30/2013   Influenza-Unspecified 08/04/2019   PFIZER(Purple Top)SARS-COV-2 Vaccination 01/02/2020, 01/28/2020   Pneumococcal Conjugate-13 07/01/2015   Pneumococcal Polysaccharide-23 12/01/2016   Tdap 11/13/2011    {TDAP status:2101805}  {Flu Vaccine status:2101806}  {Pneumococcal vaccine status:2101807}  {Covid-19 vaccine status:2101808}  Qualifies for Shingles Vaccine? {YES/NO:21197}  Zostavax completed {YES/NO:21197}  {Shingrix Completed?:2101804}  Screening Tests Health Maintenance  Topic Date Due   Zoster Vaccines- Shingrix (1 of 2) Never done   COLONOSCOPY (Pts 45-57yr Insurance coverage will need to be confirmed)  06/24/2017   FOOT EXAM  04/08/2021   OPHTHALMOLOGY EXAM  05/12/2021   INFLUENZA VACCINE  06/06/2021   COVID-19 Vaccine (4 - Booster for Pfizer series) 09/03/2021   HEMOGLOBIN A1C  10/11/2021   TETANUS/TDAP  11/12/2021   Hepatitis C Screening  Completed   PNA vac Low Risk Adult  Completed   HPV VACCINES  Aged Out    Health Maintenance  Health Maintenance Due  Topic Date Due   Zoster Vaccines- Shingrix (1 of 2) Never done   COLONOSCOPY (Pts 45-459yrInsurance coverage will need to be confirmed)  06/24/2017   FOOT EXAM  04/08/2021   OPHTHALMOLOGY EXAM  05/12/2021   INFLUENZA VACCINE  06/06/2021    {Colorectal cancer screening:2101809}  Lung Cancer Screening: (Low Dose CT Chest recommended if Age 73-80ears, 30 pack-year currently smoking OR have quit w/in 15years.) {DOES NOT does:27190::"does not"} qualify.   Lung Cancer  Screening Referral: ***  Additional Screening:  Hepatitis C Screening: {DOES NOT does:27190::"does not"} qualify; Completed ***  Vision Screening: Recommended annual ophthalmology exams for early detection of glaucoma and other disorders of the eye. Is the patient up to date with their annual eye exam?  {YES/NO:21197} Who is the provider or what is the name of the office in which  the patient attends annual eye exams? *** If pt is not established with a provider, would they like to be referred to a provider to establish care? {YES/NO:21197}.   Dental Screening: Recommended annual dental exams for proper oral hygiene  Community Resource Referral / Chronic Care Management: CRR required this visit?  {YES/NO:21197}  CCM required this visit?  {YES/NO:21197}     Plan:     I have personally reviewed and noted the following in the patient's chart:   Medical and social history Use of alcohol, tobacco or illicit drugs  Current medications and supplements including opioid prescriptions. {Opioid [phone]} Functional ability and status Nutritional status Physical activity Advanced directives List of other physicians Hospitalizations, surgeries, and ER visits in previous 12 months Vitals Screenings to include cognitive, depression, and falls Referrals and appointments  In addition, I have reviewed and discussed with patient certain preventive protocols, quality metrics, and best practice recommendations. A written personalized care plan for preventive services as well as general preventive health recommendations were provided to patient.     Randel Pigg, LPN   [id number]   Nurse Notes: ***

## 2021-06-24 NOTE — Telephone Encounter (Signed)
Called patient x3 for [place] with no answer and no voice mail.   Patient may reschedule next available appointment.  L.Admire Bunnell,LPN

## 2021-06-30 DIAGNOSIS — N183 Chronic kidney disease, stage 3 unspecified: Secondary | ICD-10-CM | POA: Diagnosis not present

## 2021-06-30 DIAGNOSIS — R809 Proteinuria, unspecified: Secondary | ICD-10-CM | POA: Diagnosis not present

## 2021-06-30 DIAGNOSIS — D631 Anemia in chronic kidney disease: Secondary | ICD-10-CM | POA: Diagnosis not present

## 2021-06-30 DIAGNOSIS — E875 Hyperkalemia: Secondary | ICD-10-CM | POA: Diagnosis not present

## 2021-06-30 DIAGNOSIS — E785 Hyperlipidemia, unspecified: Secondary | ICD-10-CM | POA: Diagnosis not present

## 2021-06-30 DIAGNOSIS — I129 Hypertensive chronic kidney disease with stage 1 through stage 4 chronic kidney disease, or unspecified chronic kidney disease: Secondary | ICD-10-CM | POA: Diagnosis not present

## 2021-06-30 DIAGNOSIS — N2581 Secondary hyperparathyroidism of renal origin: Secondary | ICD-10-CM | POA: Diagnosis not present

## 2021-07-01 ENCOUNTER — Other Ambulatory Visit: Payer: Self-pay | Admitting: Family Medicine

## 2021-07-01 ENCOUNTER — Other Ambulatory Visit: Payer: Self-pay | Admitting: Nephrology

## 2021-07-01 DIAGNOSIS — N183 Chronic kidney disease, stage 3 unspecified: Secondary | ICD-10-CM

## 2021-07-01 DIAGNOSIS — M4726 Other spondylosis with radiculopathy, lumbar region: Secondary | ICD-10-CM

## 2021-07-01 DIAGNOSIS — I129 Hypertensive chronic kidney disease with stage 1 through stage 4 chronic kidney disease, or unspecified chronic kidney disease: Secondary | ICD-10-CM

## 2021-07-01 DIAGNOSIS — R351 Nocturia: Secondary | ICD-10-CM

## 2021-07-01 NOTE — Telephone Encounter (Signed)
R/s to  07/08/21

## 2021-07-04 ENCOUNTER — Telehealth: Payer: Self-pay | Admitting: Family Medicine

## 2021-07-04 DIAGNOSIS — E875 Hyperkalemia: Secondary | ICD-10-CM | POA: Diagnosis not present

## 2021-07-04 NOTE — Telephone Encounter (Signed)
Patient called wanting to reschedule his Medicare Wellness from 9/2 due to an appointment at the kidney specialist. I tried to cancel and reschedule but it would not let me due to appointment being set as completed for 06/24/21.    Good callback number is [phone]

## 2021-07-05 NOTE — Telephone Encounter (Signed)
R/S 9/6 to virtual pt aware

## 2021-07-08 ENCOUNTER — Other Ambulatory Visit: Payer: Self-pay | Admitting: Family Medicine

## 2021-07-08 ENCOUNTER — Ambulatory Visit: Payer: Medicare Other

## 2021-07-08 ENCOUNTER — Ambulatory Visit
Admission: RE | Admit: 2021-07-08 | Discharge: 2021-07-08 | Disposition: A | Discharge status: Home / Self Care | Payer: BC Managed Care – PPO | Source: Ambulatory Visit | Attending: Nephrology | Admitting: Nephrology

## 2021-07-08 DIAGNOSIS — N1831 Chronic kidney disease, stage 3a: Secondary | ICD-10-CM | POA: Diagnosis not present

## 2021-07-08 DIAGNOSIS — Z87448 Personal history of other diseases of urinary system: Secondary | ICD-10-CM | POA: Diagnosis not present

## 2021-07-08 DIAGNOSIS — I129 Hypertensive chronic kidney disease with stage 1 through stage 4 chronic kidney disease, or unspecified chronic kidney disease: Secondary | ICD-10-CM

## 2021-07-08 DIAGNOSIS — I10 Essential (primary) hypertension: Secondary | ICD-10-CM

## 2021-07-08 DIAGNOSIS — N183 Chronic kidney disease, stage 3 unspecified: Secondary | ICD-10-CM

## 2021-07-12 ENCOUNTER — Other Ambulatory Visit: Payer: Self-pay | Admitting: Family Medicine

## 2021-07-12 ENCOUNTER — Ambulatory Visit (INDEPENDENT_AMBULATORY_CARE_PROVIDER_SITE_OTHER): Payer: BC Managed Care – PPO

## 2021-07-12 DIAGNOSIS — Z1211 Encounter for screening for malignant neoplasm of colon: Secondary | ICD-10-CM | POA: Diagnosis not present

## 2021-07-12 DIAGNOSIS — Z0000 Encounter for general adult medical examination without abnormal findings: Secondary | ICD-10-CM

## 2021-07-12 NOTE — Patient Instructions (Signed)
[name] , Thank you for taking time to come for your Medicare Wellness Visit. I appreciate your ongoing commitment to your health goals. Please review the following plan we discussed and let me know if I can assist you in the future.   Screening recommendations/referrals: Colonoscopy: referral completed 07/12/2021 Recommended yearly ophthalmology/optometry visit for glaucoma screening and checkup Recommended yearly dental visit for hygiene and checkup  Vaccinations: Influenza vaccine: due in fall 2022  Pneumococcal vaccine: completed series  Tdap vaccine: 11/13/2011 Shingles vaccine: will obtain local pharmacy     Advanced directives: none   Conditions/risks identified: none  Next appointment: none   Preventive Care 40 Years and Older, Male Preventive care refers to lifestyle choices and visits with your health care provider that can promote health and wellness. What does preventive care include? A yearly physical exam. This is also called an annual well check. Dental exams once or twice a year. Routine eye exams. Ask your health care provider how often you should have your eyes checked. Personal lifestyle choices, including: Daily care of your teeth and gums. Regular physical activity. Eating a healthy diet. Avoiding tobacco and drug use. Limiting alcohol use. Practicing safe sex. Taking low doses of aspirin every day. Taking vitamin and mineral supplements as recommended by your health care provider. What happens during an annual well check? The services and screenings done by your health care provider during your annual well check will depend on your age, overall health, lifestyle risk factors, and family history of disease. Counseling  Your health care provider may ask you questions about your: Alcohol use. Tobacco use. Drug use. Emotional well-being. Home and relationship well-being. Sexual activity. Eating habits. History of falls. Memory and ability to  understand (cognition). Work and work Statistician. Screening  You may have the following tests or measurements: Height, weight, and BMI. Blood pressure. Lipid and cholesterol levels. These may be checked every 5 years, or more frequently if you are over 57 years old. Skin check. Lung cancer screening. You may have this screening every year starting at age 62 if you have a 30-pack-year history of smoking and currently smoke or have quit within the past 15 years. Fecal occult blood test (FOBT) of the stool. You may have this test every year starting at age 83. Flexible sigmoidoscopy or colonoscopy. You may have a sigmoidoscopy every 5 years or a colonoscopy every 10 years starting at age 78. Prostate cancer screening. Recommendations will vary depending on your family history and other risks. Hepatitis C blood test. Hepatitis B blood test. Sexually transmitted disease (STD) testing. Diabetes screening. This is done by checking your blood sugar (glucose) after you have not eaten for a while (fasting). You may have this done every 1-3 years. Abdominal aortic aneurysm (AAA) screening. You may need this if you are a current or former smoker. Osteoporosis. You may be screened starting at age 81 if you are at high risk. Talk with your health care provider about your test results, treatment options, and if necessary, the need for more tests. Vaccines  Your health care provider may recommend certain vaccines, such as: Influenza vaccine. This is recommended every year. Tetanus, diphtheria, and acellular pertussis (Tdap, Td) vaccine. You may need a Td booster every 10 years. Zoster vaccine. You may need this after age 45. Pneumococcal 13-valent conjugate (PCV13) vaccine. One dose is recommended after age 100. Pneumococcal polysaccharide (PPSV23) vaccine. One dose is recommended after age 77. Talk to your health care provider about which screenings and  vaccines you need and how often you need them. This  information is not intended to replace advice given to you by your health care provider. Make sure you discuss any questions you have with your health care provider. Document Released: 11/19/2015 Document Revised: 07/12/2016 Document Reviewed: 08/24/2015 Elsevier Interactive Patient Education  2017 [employer] Prevention in the Home Falls can cause injuries. They can happen to people of all ages. There are many things you can do to make your home safe and to help prevent falls. What can I do on the outside of my home? Regularly fix the edges of walkways and driveways and fix any cracks. Remove anything that might make you trip as you walk through a door, such as a raised step or threshold. Trim any bushes or trees on the path to your home. Use bright outdoor lighting. Clear any walking paths of anything that might make someone trip, such as rocks or tools. Regularly check to see if handrails are loose or broken. Make sure that both sides of any steps have handrails. Any raised decks and porches should have guardrails on the edges. Have any leaves, snow, or ice cleared regularly. Use sand or salt on walking paths during winter. Clean up any spills in your garage right away. This includes oil or grease spills. What can I do in the bathroom? Use night lights. Install grab bars by the toilet and in the tub and shower. Do not use towel bars as grab bars. Use non-skid mats or decals in the tub or shower. If you need to sit down in the shower, use a plastic, non-slip stool. Keep the floor dry. Clean up any water that spills on the floor as soon as it happens. Remove soap buildup in the tub or shower regularly. Attach bath mats securely with double-sided non-slip rug tape. Do not have throw rugs and other things on the floor that can make you trip. What can I do in the bedroom? Use night lights. Make sure that you have a light by your bed that is easy to reach. Do not use any sheets or  blankets that are too big for your bed. They should not hang down onto the floor. Have a firm chair that has side arms. You can use this for support while you get dressed. Do not have throw rugs and other things on the floor that can make you trip. What can I do in the kitchen? Clean up any spills right away. Avoid walking on wet floors. Keep items that you use a lot in easy-to-reach places. If you need to reach something above you, use a strong step stool that has a grab bar. Keep electrical cords out of the way. Do not use floor polish or wax that makes floors slippery. If you must use wax, use non-skid floor wax. Do not have throw rugs and other things on the floor that can make you trip. What can I do with my stairs? Do not leave any items on the stairs. Make sure that there are handrails on both sides of the stairs and use them. Fix handrails that are broken or loose. Make sure that handrails are as long as the stairways. Check any carpeting to make sure that it is firmly attached to the stairs. Fix any carpet that is loose or worn. Avoid having throw rugs at the top or bottom of the stairs. If you do have throw rugs, attach them to the floor with carpet tape. Make  sure that you have a light switch at the top of the stairs and the bottom of the stairs. If you do not have them, ask someone to add them for you. What else can I do to help prevent falls? Wear shoes that: Do not have high heels. Have rubber bottoms. Are comfortable and fit you well. Are closed at the toe. Do not wear sandals. If you use a stepladder: Make sure that it is fully opened. Do not climb a closed stepladder. Make sure that both sides of the stepladder are locked into place. Ask someone to hold it for you, if possible. Clearly mark and make sure that you can see: Any grab bars or handrails. First and last steps. Where the edge of each step is. Use tools that help you move around (mobility aids) if they are  needed. These include: Canes. Walkers. Scooters. Crutches. Turn on the lights when you go into a dark area. Replace any light bulbs as soon as they burn out. Set up your furniture so you have a clear path. Avoid moving your furniture around. If any of your floors are uneven, fix them. If there are any pets around you, be aware of where they are. Review your medicines with your doctor. Some medicines can make you feel dizzy. This can increase your chance of falling. Ask your doctor what other things that you can do to help prevent falls. This information is not intended to replace advice given to you by your health care provider. Make sure you discuss any questions you have with your health care provider. Document Released: 08/19/2009 Document Revised: 03/30/2016 Document Reviewed: 11/27/2014 Elsevier Interactive Patient Education  2017 [employer].

## 2021-07-12 NOTE — Progress Notes (Signed)
Subjective:   [name] is a 75 y.o. male who presents for an Subsequent Medicare Annual Wellness Visit. Patient unable to contact to my chart performed telephone visit.  I connected with [name] today by telephone and verified that I am speaking with the correct person using two identifiers. Location patient: home Location provider: work Persons participating in the virtual visit: patient, provider.   I discussed the limitations, risks, security and privacy concerns of performing an evaluation and management service by telephone and the availability of in person appointments. I also discussed with the patient that there may be a patient responsible charge related to this service. The patient expressed understanding and verbally consented to this telephonic visit.    Interactive audio and video telecommunications were attempted between this provider and patient, however failed, due to patient having technical difficulties OR patient did not have access to video capability.  We continued and completed visit with audio only.    Review of Systems    N/a Cardiac Risk Factors include: advanced age (>28mn, >>64women);diabetes mellitus;dyslipidemia;male gender;hypertension     Objective:    Today's Vitals   There is no height or weight on file to calculate BMI.  Advanced Directives 07/12/2021 04/26/2021 07/30/2013 07/22/2013 08/25/2012 08/21/2012  Does Patient Have a Medical Advance Directive? No No Patient does not have advance directive;Patient would not like information Patient does not have advance directive;Patient would not like information Patient does not have advance directive Patient does not have advance directive  Would patient like information on creating a medical advance directive? No - Patient declined No - Patient declined - - - -  Pre-existing out of facility DNR order (yellow form or pink MOST form) - - No No No No    Current Medications (verified) Outpatient  Encounter Medications as of 07/12/2021  Medication Sig   allopurinol (ZYLOPRIM) 100 MG tablet TAKE 1 TABLET BY MOUTH  DAILY   amLODipine (NORVASC) 10 MG tablet TAKE 1 TABLET BY MOUTH  DAILY   aspirin EC 325 MG tablet Take 325 mg by mouth every morning.   atorvastatin (LIPITOR) 80 MG tablet TAKE 1 TABLET BY MOUTH EVERY MORNING.   colchicine 0.6 MG tablet Take 2 tabs (1.'2mg'$ ) at fist sign of gout flare.  Then take 1 tab 1 hour later.  On day 2 take one tab daily until flare stops.   Continuous Blood Gluc Receiver (FREESTYLE LIBRE 14 DAY READER) DEVI Use as directed   Continuous Blood Gluc Sensor (FREESTYLE LIBRE SENSOR SYSTEM) MISC Apply device as directed every 2 weeks.   gabapentin (NEURONTIN) 100 MG capsule TAKE 1 CAPSULE BY MOUTH EVERYDAY AT BEDTIME   glipiZIDE (GLUCOTROL XL) 5 MG 24 hr tablet TAKE 1 TABLET BY MOUTH DAILY BEFORE BREAKFAST.   glucose blood (ONE TOUCH ULTRA TEST) test strip 1 each by Other route daily. Use as instructed   Lancets (ONETOUCH ULTRASOFT) lancets 1 each by Other route daily. Use as instructed   lisinopril (ZESTRIL) 40 MG tablet TAKE 1 TABLET BY MOUTH EVERY DAY   meloxicam (MOBIC) 7.5 MG tablet Take 1 tablet (7.5 mg total) by mouth daily.   nitroGLYCERIN (NITROSTAT) 0.4 MG SL tablet Place 1 tablet (0.4 mg total) under the tongue every 5 (five) minutes as needed for chest pain.   sildenafil (VIAGRA) 25 MG tablet Take 1 tablet (25 mg total) by mouth daily as needed for erectile dysfunction.   tamsulosin (FLOMAX) 0.4 MG CAPS capsule TAKE 2 CAPSULES BY MOUTH EVERY DAY  traMADol (ULTRAM) 50 MG tablet TAKE 2 TABLETS (100 MG TOTAL) BY MOUTH EVERY 12 (TWELVE) HOURS AS NEEDED.   Cholecalciferol (VITAMIN D3) 1000 UNITS CAPS Take 1 capsule by mouth daily. (Patient not taking: Reported on 07/12/2021)   ibuprofen (ADVIL,MOTRIN) 200 MG tablet Take 400 mg by mouth every 6 (six) hours as needed. For pain (Patient not taking: Reported on 07/12/2021)   Omega 3-6-9 Fatty Acids (OMEGA-3 &  OMEGA-6 FISH OIL PO) Take 1,000 mg by mouth daily.  (Patient not taking: Reported on 07/12/2021)   No facility-administered encounter medications on file as of 07/12/2021.    Allergies (verified) Patient has no known allergies.   History: Past Medical History:  Diagnosis Date   CAD, ARTERY BYPASS GRAFT 02/10/2008   CAROTID ARTERY STENOSIS, WITHOUT INFARCTION 09/22/2008   CORONARY ARTERY DISEASE 04/03/2008   Diabetes mellitus    "borderline"   ERECTILE DYSFUNCTION 01/14/2009   GOUT 01/16/2008   HERPES ZOSTER 04/15/2010   HYPERLIPIDEMIA 01/16/2008   HYPERTENSION 01/16/2008   IMPAIRED GLUCOSE TOLERANCE 08/22/2010   Shingles 2011   left chest   Ulcer 30 yrs ago   stomach   Past Surgical History:  Procedure Laterality Date   COLONOSCOPY     3 months ago   CORONARY ARTERY BYPASS GRAFT  2009   vessels x3   EAR CYST EXCISION N/A 07/29/2013   Procedure: excision of urachal cyst flexible cystoscopy insertion of foley cath;  Surgeon: Fredricka Bonine, MD;  Location: [facility];  Service: Urology;  Laterality: N/A;   LAPAROSCOPY N/A 07/29/2013   Procedure: LAPAROSCOPY DIAGNOSTIC  laparoscopic excision of urachal cyst ;  Surgeon: Madilyn Hook, DO;  Location: [facility];  Service: General;  Laterality: N/A;   LAPAROTOMY  08/25/2012   Procedure: EXPLORATORY LAPAROTOMY;  Surgeon: Madilyn Hook, DO;  Location: [facility];  Service: General;  Laterality: N/A;  incision and drainage abdominal wall abcessand intra abdominal wall abcess   PARTIAL GASTRECTOMY  1974   bleeding ulcers   urachal  2014   Urachal cyst removal   Family History  Problem Relation Age of Onset   Heart disease Mother    Heart disease Sister    Colon cancer Neg Hx    Social History   Socioeconomic History   Marital status: Married    Spouse name: Not on file   Number of children: 3   Years of education: Not on file   Highest education level: Not on file  Occupational History    Employer: [employer]  Tobacco Use   Smoking  status: Former    Packs/day: 1.00    Years: 10.00    Pack years: 10.00    Types: Cigarettes    Quit date: 07/27/1999    Years since quitting: 21.9   Smokeless tobacco: Never  Substance and Sexual Activity   Alcohol use: Yes    Comment: 4 beers a day   Drug use: No   Sexual activity: Not on file  Other Topics Concern   Not on file  Social History Narrative   epworth sleepiness scale score: 4   Social Determinants of Health   Financial Resource Strain: Low Risk    Difficulty of Paying Living Expenses: Not hard at all  Food Insecurity: No Food Insecurity   Worried About Charity fundraiser in the Last Year: Never true   Arboriculturist in the Last Year: Never true  Transportation Needs: No Transportation Needs   Lack of Transportation (Medical): No  Lack of Transportation (Non-Medical): No  Physical Activity: Inactive   Days of Exercise per Week: 0 days   Minutes of Exercise per Session: 0 min  Stress: No Stress Concern Present   Feeling of Stress : Not at all  Social Connections: Moderately Isolated   Frequency of Communication with Friends and Family: Twice a week   Frequency of Social Gatherings with Friends and Family: Twice a week   Attends Religious Services: Never   Printmaker: No   Attends Music therapist: Never   Marital Status: Married    Tobacco Counseling Counseling given: Not Answered   Clinical Intake:  Pre-visit preparation completed: Yes  Pain : No/denies pain     Nutritional Risks: None Diabetes: Yes CBG done?: No Did pt. bring in CBG monitor from home?: No  How often do you need to have someone help you when you read instructions, pamphlets, or other written materials from your doctor or pharmacy?: 1 - Never What is the last grade level you completed in school?: High School  Diabetic?yes Nutrition Risk Assessment:  Has the patient had any N/V/D within the last 2 months?  No  Does the patient  have any non-healing wounds?  No  Has the patient had any unintentional weight loss or weight gain?  No   Diabetes:  Is the patient diabetic?  Yes  If diabetic, was a CBG obtained today?  No  Did the patient bring in their glucometer from home?  No  How often do you monitor your CBG's? 2 x day .   Financial Strains and Diabetes Management:  Are you having any financial strains with the device, your supplies or your medication? No .  Does the patient want to be seen by Chronic Care Management for management of their diabetes?  No  Would the patient like to be referred to a Nutritionist or for Diabetic Management?  No   Diabetic Exams:  Diabetic Eye Exam: Completed 07/2020 Diabetic Foot Exam: Overdue, Pt has been advised about the importance in completing this exam. Pt is scheduled for diabetic foot exam on next office visit .   Interpreter Needed?: No  Information entered by :: [employer] of Daily Living In your present state of health, do you have any difficulty performing the following activities: 07/12/2021  Hearing? N  Vision? N  Difficulty concentrating or making decisions? N  Walking or climbing stairs? N  Dressing or bathing? N  Doing errands, shopping? N  Preparing Food and eating ? N  Using the Toilet? N  In the past six months, have you accidently leaked urine? N  Do you have problems with loss of bowel control? N  Managing your Medications? N  Managing your Finances? N  Housekeeping or managing your Housekeeping? N  Some recent data might be hidden    Patient Care Team: [name], MD as PCP - General (Family Medicine) [name], [place] as Referring Physician (Optometry)  Indicate any recent Medical Services you may have received from other than [facility] providers in the past year (date may be approximate).     Assessment:   This is a routine wellness examination for Yousuf.  Hearing/Vision screen Vision Screening - Comments::  Annual eye exam wears glasses   Dietary issues and exercise activities discussed: Current Exercise Habits: The patient has a physically strenuous job, but has no regular exercise apart from work., Exercise limited by: None identified   Goals Addressed  None    Depression Screen PHQ 2/9 Scores 07/12/2021 04/08/2020 12/01/2016 07/01/2015 01/28/2015 12/19/2013 09/19/2013  PHQ - 2 Score 0 0 0 0 0 0 0    Fall Risk Fall Risk  07/12/2021 04/08/2020 12/01/2016 07/01/2015 01/28/2015  Falls in the past year? 0 0 No No No  Number falls in past yr: 0 - - - -  Injury with Fall? 0 - - - -  Risk for fall due to : No Fall Risks - - - -  Follow up Falls evaluation completed - - - -    FALL RISK PREVENTION PERTAINING TO THE HOME:  Any stairs in or around the home? No  If so, are there any without handrails? No  Home free of loose throw rugs in walkways, pet beds, electrical cords, etc? Yes  Adequate lighting in your home to reduce risk of falls? Yes   ASSISTIVE DEVICES UTILIZED TO PREVENT FALLS:  Life alert? No  Use of a cane, walker or w/c? No  Grab bars in the bathroom? No  Shower chair or bench in shower? No  Elevated toilet seat or a handicapped toilet? No    Cognitive Function:  Normal cognitive status assessed by direct observation by this Nurse Health Advisor. No abnormalities found.        Immunizations Immunization History  Administered Date(s) Administered   Fluad Quad(high Dose 65+) 08/05/2020   Influenza Split 08/27/2012   Influenza Whole 08/22/2010   Influenza, High Dose Seasonal PF 11/12/2014, 07/21/2016, 08/27/2017, 10/10/2018, 08/04/2019   Influenza,inj,Quad PF,6+ Mos 07/30/2013   Influenza-Unspecified 08/04/2019   PFIZER(Purple Top)SARS-COV-2 Vaccination 01/02/2020, 01/28/2020   Pneumococcal Conjugate-13 07/01/2015   Pneumococcal Polysaccharide-23 12/01/2016   Tdap 11/13/2011    TDAP status: Up to date  Flu Vaccine status: Up to date  Pneumococcal vaccine status: Up to  date  Covid-19 vaccine status: Completed vaccines  Qualifies for Shingles Vaccine? Yes   Zostavax completed No   Shingrix Completed?: No.    Education has been provided regarding the importance of this vaccine. Patient has been advised to call insurance company to determine out of pocket expense if they have not yet received this vaccine. Advised may also receive vaccine at local pharmacy or Health Dept. Verbalized acceptance and understanding.  Screening Tests Health Maintenance  Topic Date Due   Zoster Vaccines- Shingrix (1 of 2) Never done   COLONOSCOPY (Pts 45-52yr Insurance coverage will need to be confirmed)  06/24/2017   FOOT EXAM  04/08/2021   OPHTHALMOLOGY EXAM  05/12/2021   INFLUENZA VACCINE  06/06/2021   COVID-19 Vaccine (4 - Booster for Pfizer series) 09/03/2021   HEMOGLOBIN A1C  10/11/2021   TETANUS/TDAP  11/12/2021   Hepatitis C Screening  Completed   PNA vac Low Risk Adult  Completed   HPV VACCINES  Aged Out    Health Maintenance  Health Maintenance Due  Topic Date Due   Zoster Vaccines- Shingrix (1 of 2) Never done   COLONOSCOPY (Pts 45-471yrInsurance coverage will need to be confirmed)  06/24/2017   FOOT EXAM  04/08/2021   OPHTHALMOLOGY EXAM  05/12/2021   INFLUENZA VACCINE  06/06/2021    Colorectal cancer screening: Referral to GI placed 07/12/2021. Pt aware the office will call re: appt.  Lung Cancer Screening: (Low Dose CT Chest recommended if Age 73-80ears, 30 pack-year currently smoking OR have quit w/in 15years.) does not qualify.   Lung Cancer Screening Referral: n/a  Additional Screening:  Hepatitis C Screening: does not qualify; Completed 04/08/2020  Vision Screening: Recommended annual ophthalmology exams for early detection of glaucoma and other disorders of the eye. Is the patient up to date with their annual eye exam?  Yes  Who is the provider or what is the name of the office in which the patient attends annual eye exams? Dr.McFarland   If pt is not established with a provider, would they like to be referred to a provider to establish care? No .   Dental Screening: Recommended annual dental exams for proper oral hygiene  Community Resource Referral / Chronic Care Management: CRR required this visit?  No   CCM required this visit?  No      Plan:     I have personally reviewed and noted the following in the patient's chart:   Medical and social history Use of alcohol, tobacco or illicit drugs  Current medications and supplements including opioid prescriptions. Patient is currently taking opioid prescriptions. Information provided to patient regarding non-opioid alternatives. Patient advised to discuss non-opioid treatment plan with their provider. Functional ability and status Nutritional status Physical activity Advanced directives List of other physicians Hospitalizations, surgeries, and ER visits in previous 12 months Vitals Screenings to include cognitive, depression, and falls Referrals and appointments  In addition, I have reviewed and discussed with patient certain preventive protocols, quality metrics, and best practice recommendations. A written personalized care plan for preventive services as well as general preventive health recommendations were provided to patient.     Randel Pigg, LPN   [id number]   Nurse Notes: none

## 2021-07-21 DIAGNOSIS — E875 Hyperkalemia: Secondary | ICD-10-CM | POA: Diagnosis not present

## 2021-08-07 ENCOUNTER — Other Ambulatory Visit: Payer: Self-pay | Admitting: Family Medicine

## 2021-08-07 DIAGNOSIS — M4726 Other spondylosis with radiculopathy, lumbar region: Secondary | ICD-10-CM

## 2021-08-11 DIAGNOSIS — N183 Chronic kidney disease, stage 3 unspecified: Secondary | ICD-10-CM | POA: Diagnosis not present

## 2021-08-15 ENCOUNTER — Other Ambulatory Visit: Payer: Self-pay | Admitting: Family Medicine

## 2021-09-11 ENCOUNTER — Other Ambulatory Visit: Payer: Self-pay | Admitting: Family Medicine

## 2021-09-13 ENCOUNTER — Telehealth: Payer: Self-pay

## 2021-09-13 DIAGNOSIS — I10 Essential (primary) hypertension: Secondary | ICD-10-CM

## 2021-09-13 MED ORDER — LISINOPRIL 40 MG PO TABS: 40.0000 mg | ORAL_TABLET | Freq: Every day | ORAL | 1 refills | Status: DC

## 2021-09-13 NOTE — Telephone Encounter (Signed)
Rx sent in

## 2021-09-13 NOTE — Addendum Note (Signed)
Addended by: Rodrigo Ran on: 09/13/2021 03:40 PM   Modules accepted: Orders

## 2021-09-13 NOTE — Telephone Encounter (Signed)
Wife of patient called requesting Rx refill  lisinopril (ZESTRIL) 40 MG tablet

## 2021-09-27 ENCOUNTER — Other Ambulatory Visit: Payer: Self-pay | Admitting: Family Medicine

## 2021-09-27 DIAGNOSIS — M4726 Other spondylosis with radiculopathy, lumbar region: Secondary | ICD-10-CM

## 2021-09-30 ENCOUNTER — Other Ambulatory Visit: Payer: Self-pay | Admitting: Family Medicine

## 2021-09-30 DIAGNOSIS — E785 Hyperlipidemia, unspecified: Secondary | ICD-10-CM

## 2021-10-05 DIAGNOSIS — E785 Hyperlipidemia, unspecified: Secondary | ICD-10-CM | POA: Diagnosis not present

## 2021-10-05 DIAGNOSIS — D631 Anemia in chronic kidney disease: Secondary | ICD-10-CM | POA: Diagnosis not present

## 2021-10-05 DIAGNOSIS — R809 Proteinuria, unspecified: Secondary | ICD-10-CM | POA: Diagnosis not present

## 2021-10-05 DIAGNOSIS — N2581 Secondary hyperparathyroidism of renal origin: Secondary | ICD-10-CM | POA: Diagnosis not present

## 2021-10-05 DIAGNOSIS — N183 Chronic kidney disease, stage 3 unspecified: Secondary | ICD-10-CM | POA: Diagnosis not present

## 2021-10-05 DIAGNOSIS — I129 Hypertensive chronic kidney disease with stage 1 through stage 4 chronic kidney disease, or unspecified chronic kidney disease: Secondary | ICD-10-CM | POA: Diagnosis not present

## 2021-10-05 DIAGNOSIS — E875 Hyperkalemia: Secondary | ICD-10-CM | POA: Diagnosis not present

## 2021-10-05 DIAGNOSIS — D472 Monoclonal gammopathy: Secondary | ICD-10-CM | POA: Diagnosis not present

## 2021-10-23 ENCOUNTER — Inpatient Hospital Stay (HOSPITAL_COMMUNITY): Payer: BC Managed Care – PPO

## 2021-10-23 ENCOUNTER — Encounter (HOSPITAL_COMMUNITY): Payer: Self-pay

## 2021-10-23 ENCOUNTER — Emergency Department (HOSPITAL_COMMUNITY): Payer: BC Managed Care – PPO

## 2021-10-23 ENCOUNTER — Inpatient Hospital Stay (HOSPITAL_COMMUNITY)
Admission: EM | Admit: 2021-10-23 | Discharge: 2021-10-26 | DRG: 683 | Disposition: A | Discharge status: Home / Self Care | Payer: BC Managed Care – PPO | Attending: Family Medicine | Admitting: Family Medicine

## 2021-10-23 DIAGNOSIS — Z20822 Contact with and (suspected) exposure to covid-19: Secondary | ICD-10-CM | POA: Diagnosis present

## 2021-10-23 DIAGNOSIS — E872 Acidosis, unspecified: Secondary | ICD-10-CM | POA: Diagnosis present

## 2021-10-23 DIAGNOSIS — R4781 Slurred speech: Secondary | ICD-10-CM | POA: Diagnosis not present

## 2021-10-23 DIAGNOSIS — I6523 Occlusion and stenosis of bilateral carotid arteries: Secondary | ICD-10-CM

## 2021-10-23 DIAGNOSIS — E785 Hyperlipidemia, unspecified: Secondary | ICD-10-CM | POA: Diagnosis present

## 2021-10-23 DIAGNOSIS — M109 Gout, unspecified: Secondary | ICD-10-CM | POA: Diagnosis present

## 2021-10-23 DIAGNOSIS — Z791 Long term (current) use of non-steroidal anti-inflammatories (NSAID): Secondary | ICD-10-CM

## 2021-10-23 DIAGNOSIS — Z87891 Personal history of nicotine dependence: Secondary | ICD-10-CM | POA: Diagnosis not present

## 2021-10-23 DIAGNOSIS — I129 Hypertensive chronic kidney disease with stage 1 through stage 4 chronic kidney disease, or unspecified chronic kidney disease: Secondary | ICD-10-CM | POA: Diagnosis present

## 2021-10-23 DIAGNOSIS — Z79899 Other long term (current) drug therapy: Secondary | ICD-10-CM

## 2021-10-23 DIAGNOSIS — R680 Hypothermia, not associated with low environmental temperature: Secondary | ICD-10-CM | POA: Diagnosis present

## 2021-10-23 DIAGNOSIS — N1831 Chronic kidney disease, stage 3a: Secondary | ICD-10-CM | POA: Diagnosis present

## 2021-10-23 DIAGNOSIS — G459 Transient cerebral ischemic attack, unspecified: Secondary | ICD-10-CM

## 2021-10-23 DIAGNOSIS — R4701 Aphasia: Secondary | ICD-10-CM | POA: Diagnosis present

## 2021-10-23 DIAGNOSIS — E161 Other hypoglycemia: Secondary | ICD-10-CM | POA: Diagnosis not present

## 2021-10-23 DIAGNOSIS — Z23 Encounter for immunization: Secondary | ICD-10-CM

## 2021-10-23 DIAGNOSIS — I959 Hypotension, unspecified: Secondary | ICD-10-CM | POA: Diagnosis not present

## 2021-10-23 DIAGNOSIS — Z8249 Family history of ischemic heart disease and other diseases of the circulatory system: Secondary | ICD-10-CM

## 2021-10-23 DIAGNOSIS — N189 Chronic kidney disease, unspecified: Secondary | ICD-10-CM

## 2021-10-23 DIAGNOSIS — E871 Hypo-osmolality and hyponatremia: Secondary | ICD-10-CM | POA: Diagnosis present

## 2021-10-23 DIAGNOSIS — E1129 Type 2 diabetes mellitus with other diabetic kidney complication: Secondary | ICD-10-CM | POA: Diagnosis present

## 2021-10-23 DIAGNOSIS — E875 Hyperkalemia: Secondary | ICD-10-CM

## 2021-10-23 DIAGNOSIS — Z6841 Body Mass Index (BMI) 40.0 and over, adult: Secondary | ICD-10-CM

## 2021-10-23 DIAGNOSIS — I2510 Atherosclerotic heart disease of native coronary artery without angina pectoris: Secondary | ICD-10-CM | POA: Diagnosis not present

## 2021-10-23 DIAGNOSIS — E1122 Type 2 diabetes mellitus with diabetic chronic kidney disease: Secondary | ICD-10-CM | POA: Diagnosis present

## 2021-10-23 DIAGNOSIS — E860 Dehydration: Secondary | ICD-10-CM | POA: Diagnosis present

## 2021-10-23 DIAGNOSIS — Z951 Presence of aortocoronary bypass graft: Secondary | ICD-10-CM

## 2021-10-23 DIAGNOSIS — I10 Essential (primary) hypertension: Secondary | ICD-10-CM | POA: Diagnosis not present

## 2021-10-23 DIAGNOSIS — Z7982 Long term (current) use of aspirin: Secondary | ICD-10-CM

## 2021-10-23 DIAGNOSIS — N179 Acute kidney failure, unspecified: Secondary | ICD-10-CM | POA: Diagnosis not present

## 2021-10-23 DIAGNOSIS — E162 Hypoglycemia, unspecified: Secondary | ICD-10-CM | POA: Diagnosis not present

## 2021-10-23 DIAGNOSIS — R29818 Other symptoms and signs involving the nervous system: Secondary | ICD-10-CM | POA: Diagnosis not present

## 2021-10-23 DIAGNOSIS — N170 Acute kidney failure with tubular necrosis: Secondary | ICD-10-CM | POA: Diagnosis not present

## 2021-10-23 DIAGNOSIS — G4489 Other headache syndrome: Secondary | ICD-10-CM | POA: Diagnosis not present

## 2021-10-23 DIAGNOSIS — R4182 Altered mental status, unspecified: Secondary | ICD-10-CM | POA: Diagnosis not present

## 2021-10-23 DIAGNOSIS — E11649 Type 2 diabetes mellitus with hypoglycemia without coma: Secondary | ICD-10-CM | POA: Diagnosis present

## 2021-10-23 DIAGNOSIS — Z7984 Long term (current) use of oral hypoglycemic drugs: Secondary | ICD-10-CM

## 2021-10-23 LAB — CBG MONITORING, ED
Glucose-Capillary: 72 mg/dL (ref 70–99)
Glucose-Capillary: 91 mg/dL (ref 70–99)
Glucose-Capillary: 102 mg/dL — ABNORMAL HIGH (ref 70–99)
Glucose-Capillary: 115 mg/dL — ABNORMAL HIGH (ref 70–99)
Glucose-Capillary: 154 mg/dL — ABNORMAL HIGH (ref 70–99)

## 2021-10-23 LAB — I-STAT VENOUS BLOOD GAS, ED
Acid-base deficit: 16 mmol/L — ABNORMAL HIGH (ref 0.0–2.0)
Bicarbonate: 11.4 mmol/L — ABNORMAL LOW (ref 20.0–28.0)
Calcium, Ion: 1.18 mmol/L (ref 1.15–1.40)
HCT: 30 % — ABNORMAL LOW (ref 39.0–52.0)
Hemoglobin: 10.2 g/dL — ABNORMAL LOW (ref 13.0–17.0)
O2 Saturation: 87 %
Potassium: 5.4 mmol/L — ABNORMAL HIGH (ref 3.5–5.1)
Sodium: 129 mmol/L — ABNORMAL LOW (ref 135–145)
TCO2: 12 mmol/L — ABNORMAL LOW (ref 22–32)
pCO2, Ven: 31.3 mmHg — ABNORMAL LOW (ref 44.0–60.0)
pH, Ven: 7.171 — CL (ref 7.250–7.430)
pO2, Ven: 65 mmHg — ABNORMAL HIGH (ref 32.0–45.0)

## 2021-10-23 LAB — TROPONIN I (HIGH SENSITIVITY)
Troponin I (High Sensitivity): 15 ng/L (ref <18)
Troponin I (High Sensitivity): 15 ng/L (ref <18)

## 2021-10-23 LAB — URINALYSIS, ROUTINE W REFLEX MICROSCOPIC
Bilirubin Urine: NEGATIVE
Glucose, UA: NEGATIVE mg/dL
Ketones, ur: NEGATIVE mg/dL
Leukocytes,Ua: NEGATIVE
Nitrite: NEGATIVE
Protein, ur: NEGATIVE mg/dL
Specific Gravity, Urine: 1.01 (ref 1.005–1.030)
pH: 5.5 (ref 5.0–8.0)

## 2021-10-23 LAB — RESP PANEL BY RT-PCR (FLU A&B, COVID) ARPGX2
Influenza A by PCR: NEGATIVE
Influenza B by PCR: NEGATIVE
SARS Coronavirus 2 by RT PCR: NEGATIVE

## 2021-10-23 LAB — CBC WITH DIFFERENTIAL/PLATELET
Abs Immature Granulocytes: 0.02 10*3/uL (ref 0.00–0.07)
Basophils Absolute: 0 10*3/uL (ref 0.0–0.1)
Basophils Relative: 0 %
Eosinophils Absolute: 0 10*3/uL (ref 0.0–0.5)
Eosinophils Relative: 0 %
HCT: 33.6 % — ABNORMAL LOW (ref 39.0–52.0)
Hemoglobin: 10.2 g/dL — ABNORMAL LOW (ref 13.0–17.0)
Immature Granulocytes: 0 %
Lymphocytes Relative: 7 %
Lymphs Abs: 0.5 10*3/uL — ABNORMAL LOW (ref 0.7–4.0)
MCH: 31.2 pg (ref 26.0–34.0)
MCHC: 30.4 g/dL (ref 30.0–36.0)
MCV: 102.8 fL — ABNORMAL HIGH (ref 80.0–100.0)
Monocytes Absolute: 0.4 10*3/uL (ref 0.1–1.0)
Monocytes Relative: 6 %
Neutro Abs: 5.9 10*3/uL (ref 1.7–7.7)
Neutrophils Relative %: 87 %
Platelets: 178 10*3/uL (ref 150–400)
RBC: 3.27 MIL/uL — ABNORMAL LOW (ref 4.22–5.81)
RDW: 14.9 % (ref 11.5–15.5)
WBC: 6.8 10*3/uL (ref 4.0–10.5)
nRBC: 0 % (ref 0.0–0.2)

## 2021-10-23 LAB — LACTIC ACID, PLASMA
Lactic Acid, Venous: 1.9 mmol/L (ref 0.5–1.9)
Lactic Acid, Venous: 1.4 mmol/L (ref 0.5–1.9)

## 2021-10-23 LAB — ECHOCARDIOGRAM COMPLETE
AR max vel: 1.64 cm2
AV Area VTI: 1.77 cm2
AV Area mean vel: 1.64 cm2
AV Mean grad: 16 mmHg
AV Peak grad: 37 mmHg
Ao pk vel: 3.04 m/s
Area-P 1/2: 3.6 cm2
S' Lateral: 4.1 cm
Single Plane A4C EF: 54.8 %

## 2021-10-23 LAB — COMPREHENSIVE METABOLIC PANEL
ALT: 17 U/L (ref 0–44)
AST: 28 U/L (ref 15–41)
Albumin: 2.8 g/dL — ABNORMAL LOW (ref 3.5–5.0)
Alkaline Phosphatase: 97 U/L (ref 38–126)
Anion gap: 8 (ref 5–15)
BUN: 64 mg/dL — ABNORMAL HIGH (ref 8–23)
CO2: 12 mmol/L — ABNORMAL LOW (ref 22–32)
Calcium: 8.2 mg/dL — ABNORMAL LOW (ref 8.9–10.3)
Chloride: 107 mmol/L (ref 98–111)
Creatinine, Ser: 2.54 mg/dL — ABNORMAL HIGH (ref 0.61–1.24)
GFR, Estimated: 26 mL/min — ABNORMAL LOW (ref >60)
Glucose, Bld: 83 mg/dL (ref 70–99)
Potassium: 5.4 mmol/L — ABNORMAL HIGH (ref 3.5–5.1)
Sodium: 127 mmol/L — ABNORMAL LOW (ref 135–145)
Total Bilirubin: 0.6 mg/dL (ref 0.3–1.2)
Total Protein: 6.3 g/dL — ABNORMAL LOW (ref 6.5–8.1)

## 2021-10-23 LAB — I-STAT CHEM 8, ED
BUN: 78 mg/dL — ABNORMAL HIGH (ref 8–23)
Calcium, Ion: 1.13 mmol/L — ABNORMAL LOW (ref 1.15–1.40)
Chloride: 109 mmol/L (ref 98–111)
Creatinine, Ser: 2.8 mg/dL — ABNORMAL HIGH (ref 0.61–1.24)
Glucose, Bld: 80 mg/dL (ref 70–99)
HCT: 34 % — ABNORMAL LOW (ref 39.0–52.0)
Hemoglobin: 11.6 g/dL — ABNORMAL LOW (ref 13.0–17.0)
Potassium: 6.4 mmol/L (ref 3.5–5.1)
Sodium: 128 mmol/L — ABNORMAL LOW (ref 135–145)
TCO2: 13 mmol/L — ABNORMAL LOW (ref 22–32)

## 2021-10-23 LAB — BASIC METABOLIC PANEL
Anion gap: 4 — ABNORMAL LOW (ref 5–15)
BUN: 56 mg/dL — ABNORMAL HIGH (ref 8–23)
CO2: 15 mmol/L — ABNORMAL LOW (ref 22–32)
Calcium: 8.6 mg/dL — ABNORMAL LOW (ref 8.9–10.3)
Chloride: 114 mmol/L — ABNORMAL HIGH (ref 98–111)
Creatinine, Ser: 2.01 mg/dL — ABNORMAL HIGH (ref 0.61–1.24)
GFR, Estimated: 34 mL/min — ABNORMAL LOW (ref >60)
Glucose, Bld: 166 mg/dL — ABNORMAL HIGH (ref 70–99)
Potassium: 5.8 mmol/L — ABNORMAL HIGH (ref 3.5–5.1)
Sodium: 133 mmol/L — ABNORMAL LOW (ref 135–145)

## 2021-10-23 LAB — BLOOD GAS, VENOUS
Acid-base deficit: 10.1 mmol/L — ABNORMAL HIGH (ref 0.0–2.0)
Bicarbonate: 15.9 mmol/L — ABNORMAL LOW (ref 20.0–28.0)
FIO2: 21
O2 Saturation: 62.5 %
Patient temperature: 37
pCO2, Ven: 38.6 mmHg — ABNORMAL LOW (ref 44.0–60.0)
pH, Ven: 7.239 — ABNORMAL LOW (ref 7.250–7.430)
pO2, Ven: 37.8 mmHg (ref 32.0–45.0)

## 2021-10-23 LAB — SODIUM, URINE, RANDOM: Sodium, Ur: 30 mmol/L

## 2021-10-23 LAB — URINALYSIS, MICROSCOPIC (REFLEX): Squamous Epithelial / HPF: NONE SEEN (ref 0–5)

## 2021-10-23 LAB — OSMOLALITY: Osmolality: 294 mOsm/kg (ref 275–295)

## 2021-10-23 LAB — OSMOLALITY, URINE: Osmolality, Ur: 252 mOsm/kg — ABNORMAL LOW (ref 300–900)

## 2021-10-23 MED ORDER — STROKE: EARLY STAGES OF RECOVERY BOOK: Freq: Once | Status: AC

## 2021-10-23 MED ORDER — ATORVASTATIN CALCIUM 80 MG PO TABS
80.0000 mg | ORAL_TABLET | Freq: Every morning | ORAL | Status: DC
  Administered 2021-10-23 – 2021-10-25 (×3): 80 mg via ORAL
  Filled 2021-10-23 (×2): qty 1
  Filled 2021-10-23: qty 2

## 2021-10-23 MED ORDER — ACETAMINOPHEN 650 MG RE SUPP: 650.0000 mg | RECTAL | Status: DC | PRN

## 2021-10-23 MED ORDER — SENNOSIDES-DOCUSATE SODIUM 8.6-50 MG PO TABS: 1.0000 | ORAL_TABLET | Freq: Every evening | ORAL | Status: DC | PRN

## 2021-10-23 MED ORDER — LACTATED RINGERS IV SOLN: INTRAVENOUS | Status: DC

## 2021-10-23 MED ORDER — AMLODIPINE BESYLATE 5 MG PO TABS: 10.0000 mg | ORAL_TABLET | Freq: Every day | ORAL | Status: DC

## 2021-10-23 MED ORDER — SODIUM CHLORIDE 0.9 % IV SOLN: INTRAVENOUS | Status: DC

## 2021-10-23 MED ORDER — ACETAMINOPHEN 325 MG PO TABS
650.0000 mg | ORAL_TABLET | ORAL | Status: DC | PRN
  Administered 2021-10-23 – 2021-10-25 (×3): 650 mg via ORAL
  Filled 2021-10-23 (×3): qty 2

## 2021-10-23 MED ORDER — DEXTROSE 50 % IV SOLN
25.0000 mL | Freq: Once | INTRAVENOUS | Status: AC
  Administered 2021-10-23: 03:00:00 25 mL via INTRAVENOUS
  Filled 2021-10-23: qty 50

## 2021-10-23 MED ORDER — ACETAMINOPHEN 160 MG/5ML PO SOLN: 650.0000 mg | ORAL | Status: DC | PRN

## 2021-10-23 MED ORDER — TAMSULOSIN HCL 0.4 MG PO CAPS
0.8000 mg | ORAL_CAPSULE | Freq: Every day | ORAL | Status: DC
  Administered 2021-10-23 – 2021-10-26 (×4): 0.8 mg via ORAL
  Filled 2021-10-23 (×4): qty 2

## 2021-10-23 MED ORDER — SODIUM CHLORIDE 0.9 % IV BOLUS
250.0000 mL | Freq: Once | INTRAVENOUS | Status: AC
  Administered 2021-10-23: 04:00:00 250 mL via INTRAVENOUS

## 2021-10-23 NOTE — H&P (Signed)
History and Physical    [name] SWF:[id number] DOB: [birth year] DOA: 10/23/2021  PCP: [name], MD   Patient coming from: Home  Chief Complaint: Unable to speak  HPI: [name] is a 75 y.o. male with medical history significant for DMT2, CKD 3, HTN, CAD, HLD, gout who went to bed around 9:30 pm last night and was feeling normal he then woke up around 1 AM and had confusion and was unable to speak.  He reports that he just was not feeling very well when he first woke up and when he tried to speak he could not get any words out so his wife called 911.  Reportedly EMS found his blood sugar to be 62 and he was given something to drink and blood sugar improved.  In the emergency room patient was able to speak was found to be mildly hypothermic.  He complained of some mild upset stomach and nausea.  He was placed on a Bair hugger and given some IV fluids.  He did not have any fever or vomiting at home.  He denies any chest pain or palpitations, denies any cough or shortness of breath.  States he is not had any sick contacts that he knows of.  He reports he was seen by his nephrologist a few weeks ago and was put on some new medications.  He has 1 was [facility] for his potassium and the other medication was for his kidneys and blood sugar but does not know the name of it.  He states that when he started taking the medication he began to have more frequent urination.   ED Course: Patient found to have hypothermia with a temperature of 93 degrees when he arrived.  He is otherwise been hemodynamically stable.  He is been placed on a Bair hugger and temperature is improving.  He is able to speak and answer all questions appropriately in the emergency room.  His aphasia has resolved.  He was given a half amp of D50 in the emergency room his blood sugars remained in the 80-90 range.  He was found to have acute kidney injury superimposed on his chronic kidney disease renal ultrasound was ordered.   He was also found to have hyponatremia of 127 potassium is mildly elevated at 5.4.  There are no EKG changes consistent with hyperkalemia.  Creatinine was 2.54 is increased from baseline of 1.24.  BUN 64 albumin 2.8 with normal LFTs.  Troponin of 15 on 2 occasions.  WBC 6800 hemoglobin 10.2 hematocrit 33.6 platelets 178,000.  COVID test is pending.  Hospitalist service been asked admit for further management  Review of Systems:  General: Denies fever, chills, weight loss, night sweats.  Denies dizziness.  Denies change in appetite HENT: Denies head trauma, headache, denies change in hearing, tinnitus.  Denies nasal congestion or bleeding.  Denies sore throat.  Denies difficulty swallowing Eyes: Denies blurry vision, pain in eye, drainage.  Denies discoloration of eyes. Neck: Denies pain.  Denies swelling.  Denies pain with movement. Cardiovascular: Denies chest pain, palpitations. Denies orthopnea. Has chronic edema of legs Respiratory: Denies shortness of breath, cough.  Denies wheezing.  Denies sputum production Gastrointestinal: Denies abdominal pain, swelling. Denies nausea, vomiting, diarrhea. Denies melena. Denies hematemesis. Musculoskeletal: Denies limitation of movement.  Denies deformity or swelling.  Denies pain.  Denies arthralgias or myalgias. Genitourinary: Denies pelvic pain.  Denies urinary frequency or hesitancy.  Denies dysuria.  Skin: Denies rash.  Denies petechiae, purpura, ecchymosis. Neurological:  Denies syncope. Denies seizure activity. Denies paresthesia. Denies drooping face. Denies visual change. Psychiatric: Denies depression, anxiety. Denies hallucinations.  Past Medical History:  Diagnosis Date   CAD, ARTERY BYPASS GRAFT 02/10/2008   CAROTID ARTERY STENOSIS, WITHOUT INFARCTION 09/22/2008   CORONARY ARTERY DISEASE 04/03/2008   Diabetes mellitus    "borderline"   ERECTILE DYSFUNCTION 01/14/2009   GOUT 01/16/2008   HERPES ZOSTER 04/15/2010   HYPERLIPIDEMIA 01/16/2008    HYPERTENSION 01/16/2008   IMPAIRED GLUCOSE TOLERANCE 08/22/2010   Shingles 2011   left chest   Ulcer 30 yrs ago   stomach    Past Surgical History:  Procedure Laterality Date   COLONOSCOPY     3 months ago   CORONARY ARTERY BYPASS GRAFT  2009   vessels x3   EAR CYST EXCISION N/A 07/29/2013   Procedure: excision of urachal cyst flexible cystoscopy insertion of foley cath;  Surgeon: Fredricka Bonine, MD;  Location: [facility];  Service: Urology;  Laterality: N/A;   LAPAROSCOPY N/A 07/29/2013   Procedure: LAPAROSCOPY DIAGNOSTIC  laparoscopic excision of urachal cyst ;  Surgeon: Madilyn Hook, DO;  Location: [facility];  Service: General;  Laterality: N/A;   LAPAROTOMY  08/25/2012   Procedure: EXPLORATORY LAPAROTOMY;  Surgeon: Madilyn Hook, DO;  Location: [facility];  Service: General;  Laterality: N/A;  incision and drainage abdominal wall abcessand intra abdominal wall abcess   PARTIAL GASTRECTOMY  1974   bleeding ulcers   urachal  2014   Urachal cyst removal    Social History  reports that he quit smoking about 22 years ago. His smoking use included cigarettes. He has a 10.00 pack-year smoking history. He has never used smokeless tobacco. He reports current alcohol use. He reports that he does not use drugs.  No Known Allergies  Family History  Problem Relation Age of Onset   Heart disease Mother    Heart disease Sister    Colon cancer Neg Hx      Prior to Admission medications   Medication Sig Start Date End Date Taking? Authorizing Provider  allopurinol (ZYLOPRIM) 100 MG tablet TAKE 1 TABLET BY MOUTH  DAILY 07/12/21   [name], MD  amLODipine (NORVASC) 10 MG tablet TAKE 1 TABLET BY MOUTH  DAILY 12/20/20   [name], MD  aspirin EC 325 MG tablet Take 325 mg by mouth every morning.    [provider]  atorvastatin (LIPITOR) 80 MG tablet TAKE 1 TABLET BY MOUTH EVERY MORNING. 10/03/21   [name], MD  Cholecalciferol (VITAMIN D3) 1000 UNITS CAPS Take 1  capsule by mouth daily. Patient not taking: Reported on 07/12/2021    [provider]  colchicine 0.6 MG tablet Take 2 tabs (1.2mg ) at fist sign of gout flare.  Then take 1 tab 1 hour later.  On day 2 take one tab daily until flare stops. 04/08/20   [name], MD  Continuous Blood Gluc Receiver (FREESTYLE LIBRE 14 DAY READER) DEVI Use as directed 04/08/20   [name], MD  Continuous Blood Gluc Sensor (FREESTYLE LIBRE SENSOR SYSTEM) MISC Apply device as directed every 2 weeks. 04/08/20   [name], MD  gabapentin (NEURONTIN) 100 MG capsule TAKE 1 CAPSULE BY MOUTH EVERYDAY AT BEDTIME 02/17/21   [name], MD  glipiZIDE (GLUCOTROL XL) 5 MG 24 hr tablet TAKE 1 TABLET BY MOUTH DAILY BEFORE BREAKFAST. 03/23/21   [name], MD  glucose blood (ONE TOUCH ULTRA TEST) test strip 1  each by Other route daily. Use as instructed 08/29/12   Marletta Lor, MD  ibuprofen (ADVIL,MOTRIN) 200 MG tablet Take 400 mg by mouth every 6 (six) hours as needed. For pain Patient not taking: Reported on 07/12/2021    [provider]  Lancets [facility] ULTRASOFT) lancets 1 each by Other route daily. Use as instructed 08/29/12   Marletta Lor, MD  lisinopril (ZESTRIL) 40 MG tablet Take 1 tablet (40 mg total) by mouth daily. 09/13/21   [name], MD  meloxicam (MOBIC) 7.5 MG tablet Take 1 tablet (7.5 mg total) by mouth daily. 06/06/18   Marletta Lor, MD  nitroGLYCERIN (NITROSTAT) 0.4 MG SL tablet Place 1 tablet (0.4 mg total) under the tongue every 5 (five) minutes as needed for chest pain. 09/06/20   [name], MD  Omega 3-6-9 Fatty Acids (OMEGA-3 & OMEGA-6 FISH OIL PO) Take 1,000 mg by mouth daily.  Patient not taking: Reported on 07/12/2021    [provider]  sildenafil (VIAGRA) 25 MG tablet Take 1 tablet (25 mg total) by mouth daily as needed for erectile dysfunction. 05/19/21   [name], MD  tamsulosin (FLOMAX) 0.4 MG CAPS capsule TAKE 2  CAPSULES BY MOUTH EVERY DAY 07/01/21   [name], MD  traMADol (ULTRAM) 50 MG tablet TAKE 2 TABLETS (100 MG TOTAL) BY MOUTH EVERY 12 (TWELVE) HOURS AS NEEDED. 09/28/21 10/28/21  [name], MD    Physical Exam: Vitals:   10/23/21 0247 10/23/21 0315 10/23/21 0400 10/23/21 0430  BP:  (!) 118/59 122/64 (!) 110/53  Pulse:  66 69 65  Resp:  15 19 12   Temp: (!) 93 F (33.9 C)     TempSrc: Rectal     SpO2:  98% 96% 95%    Constitutional: NAD, calm, comfortable Vitals:   10/23/21 0247 10/23/21 0315 10/23/21 0400 10/23/21 0430  BP:  (!) 118/59 122/64 (!) 110/53  Pulse:  66 69 65  Resp:  15 19 12   Temp: (!) 93 F (33.9 C)     TempSrc: Rectal     SpO2:  98% 96% 95%   General: WDWN, Alert and oriented x3.  Eyes: EOMI, PERRL, conjunctivae normal.  Sclera nonicteric HENT:  [place]/AT, external ears normal.  Nares patent without epistasis.  Mucous membranes are moist. Posterior pharynx clear of any exudate Tongue in midline with no deviation Neck: Soft, normal range of motion, supple, no masses, Trachea midline Respiratory: clear to auscultation bilaterally, no wheezing, no crackles. Normal respiratory effort. No accessory muscle use.  Cardiovascular: Regular rate and rhythm, no murmurs / rubs / gallops. Has +1 lower extremity edema. 2+ pedal pulses. No carotid bruits.  Abdomen: Soft, no tenderness, nondistended, no rebound or guarding.  No masses palpated. Obese. Bowel sounds normoactive Musculoskeletal: FROM. no cyanosis. No joint deformity upper and lower extremities. Normal muscle tone.  Skin: Warm, dry, intact no rashes, lesions, ulcers. No induration Neurologic: CN 2-12 grossly intact.  Normal speech.  Sensation intact to light touch. Strength 5/5 in all extremities. No tremor. No drift  Psychiatric: Normal judgment and insight.  Normal mood.    Labs on Admission: I have personally reviewed following labs and imaging studies  CBC: Recent Labs  Lab 10/23/21 0239  10/23/21 0250 10/23/21 0400  WBC 6.8  --   --   NEUTROABS 5.9  --   --   HGB 10.2* 11.6* 10.2*  HCT 33.6* 34.0* 30.0*  MCV 102.8*  --   --  PLT 178  --   --     Basic Metabolic Panel: Recent Labs  Lab 10/23/21 0239 10/23/21 0250 10/23/21 0400  NA 127* 128* 129*  K 5.4* 6.4* 5.4*  CL 107 109  --   CO2 12*  --   --   GLUCOSE 83 80  --   BUN 64* 78*  --   CREATININE 2.54* 2.80*  --   CALCIUM 8.2*  --   --     GFR: CrCl cannot be calculated (Unknown ideal weight.).  Liver Function Tests: Recent Labs  Lab 10/23/21 0239  AST 28  ALT 17  ALKPHOS 97  BILITOT 0.6  PROT 6.3*  ALBUMIN 2.8*    Urine analysis:    Component Value Date/Time   COLORURINE YELLOW 10/23/2021 [place] 10/23/2021 0415   LABSPEC 1.010 10/23/2021 0415   PHURINE 5.5 10/23/2021 0415   GLUCOSEU NEGATIVE 10/23/2021 0415   HGBUR TRACE (A) 10/23/2021 0415   HGBUR negative 08/05/2010 [place] 10/23/2021 0415   BILIRUBINUR 1+ 09/06/2020 1431   KETONESUR NEGATIVE 10/23/2021 0415   PROTEINUR NEGATIVE 10/23/2021 0415   UROBILINOGEN 0.2 09/06/2020 1431   UROBILINOGEN 1.0 08/25/2012 1744   NITRITE NEGATIVE 10/23/2021 0415   LEUKOCYTESUR NEGATIVE 10/23/2021 0415    Radiological Exams on Admission: CT Head Wo Contrast  Result Date: 10/23/2021 CLINICAL DATA:  Altered mental status, aphasia EXAM: CT HEAD WITHOUT CONTRAST TECHNIQUE: Contiguous axial images were obtained from the base of the skull through the vertex without intravenous contrast. COMPARISON:  None. FINDINGS: Brain: Normal anatomic configuration. Parenchymal volume loss is commensurate with the patient's age. Mild periventricular white matter changes are present likely reflecting the sequela of small vessel ischemia. No abnormal intra or extra-axial mass lesion or fluid collection. No abnormal mass effect or midline shift. No evidence of acute intracranial hemorrhage or infarct. Ventricular size is normal.  Cerebellum unremarkable. Vascular: No asymmetric hyperdense vasculature at the skull base. Moderate atherosclerotic calcification is seen within the carotid siphons and distal vertebral arteries. Skull: Intact Sinuses/Orbits: Paranasal sinuses are clear. Orbits are unremarkable. Other: Multiple fluid-filled inferior right mastoid air cells are identified without associated osseous erosion. Left mastoid air cells and middle ear cavities are clear. IMPRESSION: No acute intracranial abnormality. Electronically Signed   By: Fidela Salisbury M.D.   On: 10/23/2021 03:14   DG Chest Port 1 View  Result Date: 10/23/2021 CLINICAL DATA:  Altered mental status EXAM: PORTABLE CHEST 1 VIEW COMPARISON:  08/21/2022 FINDINGS: Lungs are well expanded, symmetric, and clear. No pneumothorax or pleural effusion. Coronary artery bypass grafting has been performed. Cardiac size is likely within normal limits when accounting for semi-erect positioning. Pulmonary vascularity is normal. Osseous structures are age-appropriate. No acute bone abnormality. IMPRESSION: No active disease. Electronically Signed   By: Fidela Salisbury M.D.   On: 10/23/2021 03:12    EKG: Independently reviewed.  EKG shows normal sinus rhythm with no acute ST elevation or depression.  No signs of ischemia.  No peaked T waves.  QTc 460  Assessment/Plan Principal Problem:   Acute kidney injury superimposed on chronic kidney disease  Mr. Mossberg is admitted to Medical telemetry floor.  IVF hydration with LR at 75 ml/hr overnight. Recheck renal function and electrolytes in am Renal U/S has been obtained and results pending.   Active Problems:   Aphasia Pt woke up at 0100 with aphasia. Aphasia has resolved.  Speech therapy to evaluate in am    TIA (transient  ischemic attack) Check MRI brain to assess for CVA or other etiology Check MRA head to evaluate for LVO  Obtain bilateral carotid u/s     DM (diabetes mellitus) type II controlled with renal  manifestation Pt with hypoglycemia initially per EMS. Given 1/2 amp D50 in ER.  Blood sugar 80-90 in ER. Check CBG every 2 hours x 3  Hold glipizide.  Check HgbA1c    Hyponatremia Check urine sodium and osmalarity IVF hydration with LR.     Essential hypertension Continue home dose of Norvasc. Monitor BP    Hyperkalemia Mildly elevated with no EKG changes. Check potassium at noon and if rises will treat.  Had higher level of potassium-6.3-on iStat machine in Er with VBG but CMP showed potassium to be 5.4     Metabolic Acidosis Pt is asymptomatic at this time.  Most likely the medication he was started on a few weeks ago was an SLGT2 inhibitor which is known to potentially cause Euglycemic DKA. Will have pharmacy verify his medications and if is SLGT2 inhibitor will need to be discontinued.  Recheck VBG at noon.     Hypothermia Bear hugger in place    DVT prophylaxis: SCDs for DVT prophylaxis.   Code Status:   Full code  Family Communication:  Diagnosis and plan discussed with patient.  He verbalized understanding and agrees with plan.  Further recommendations to follow as clinically indicated Disposition Plan:   Patient is from:  Home  Anticipated DC to:  Home  Anticipated DC date:  Anticipate 2 midnight or more stay in hospital  Admission status:  Inpatient   Yevonne Aline Verlyn Lambert MD [facility]  How to contact the [facility] Attending or Consulting provider [place] or covering provider during after hours [place], for this patient?   Check the care team in [facility] and look for a) attending/consulting [facility] provider listed and b) the [facility] team listed Log into [email or web address] and use [facility]'s universal password to access. If you do not have the password, please contact the hospital operator. Locate the [facility] provider you are looking for under [facility] and page to a number that you can be directly reached. If you still have difficulty reaching the provider, please page the  [facility] (Director on Call) for the Hospitalists listed on amion for assistance.  10/23/2021, 5:15 AM

## 2021-10-23 NOTE — ED Notes (Signed)
Bear hugger applied to pt.

## 2021-10-23 NOTE — Progress Notes (Signed)
VASCULAR LAB    Carotid duplex has been performed.  See CV proc for preliminary results.   Eriko Economos, RVT 10/23/2021, 10:19 AM

## 2021-10-23 NOTE — ED Notes (Signed)
Pt had difficulty with urinal and soiled gown/sheets. RN assisted pt into new gown and changed linens. Pt given warm blankets. VSS, no distress noted, call light in reach.

## 2021-10-23 NOTE — ED Notes (Signed)
Patient transported to MRI. Transporter agreed to ask MRI if they would ensure that the pt is covered with warm blankets due to low temperature

## 2021-10-23 NOTE — ED Notes (Signed)
No need for in and out cath do to the pt being able to urinate confirmed wit EDP

## 2021-10-23 NOTE — ED Provider Notes (Signed)
[facility] Provider Note   CSN: [id number] Arrival date & time: 10/23/21  0226     History Chief Complaint  Patient presents with   Aphasia         [name] is a 75 y.o. male.  The history is provided by the patient, the EMS personnel, the spouse and medical records.  [name] is a 75 y.o. male who presents to the Emergency Department complaining of AMS.  He presents to the ED for evaluation of AMS.  He was in his routine state of health when he went to bed at 930pm.  He woke from sleep at 0100 feeling very unwell with difficulty speaking.  His wife called 52 and he was found to have a blood sugar of 62 and he was treated with tea.  Blood sugar was improved.  On ED arrival he reports still feeling unwell with nausea/upset stomach as well as ongoing difficulty speaking.  He did have posterior neck pain yesterday after leaving work, which occurs from time to time.  He did start a new medication two weeks ago - not sure of the name.      Past Medical History:  Diagnosis Date   CAD, ARTERY BYPASS GRAFT 02/10/2008   CAROTID ARTERY STENOSIS, WITHOUT INFARCTION 09/22/2008   CORONARY ARTERY DISEASE 04/03/2008   Diabetes mellitus    "borderline"   ERECTILE DYSFUNCTION 01/14/2009   GOUT 01/16/2008   HERPES ZOSTER 04/15/2010   HYPERLIPIDEMIA 01/16/2008   HYPERTENSION 01/16/2008   IMPAIRED GLUCOSE TOLERANCE 08/22/2010   Shingles 2011   left chest   Ulcer 30 yrs ago   stomach    Patient Active Problem List   Diagnosis Date Noted   Acute kidney injury superimposed on chronic kidney disease ([place]) 10/23/2021   Aphasia 10/23/2021   Hyponatremia 10/23/2021   Hyperkalemia 10/23/2021   TIA (transient ischemic attack) 79/89/2119   Metabolic acidosis 41/74/0814   ETOH abuse 08/10/2020   Nocturia 08/10/2020   Heart burn 08/10/2020   Left hip pain 06/06/2018   DM (diabetes mellitus) type II controlled with renal manifestation ([place])  12/01/2016   Medicare annual wellness visit, subsequent 12/01/2016   BPH (benign prostatic hyperplasia) 01/28/2015   Type 2 diabetes mellitus with peripheral vascular disease ([place]) 06/20/2013   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 07/26/2012   HERPES ZOSTER 04/15/2010   ERECTILE DYSFUNCTION 01/14/2009   Occlusion and stenosis of carotid artery 09/22/2008   Coronary atherosclerosis 04/03/2008   CAD, ARTERY BYPASS GRAFT 02/10/2008   Dyslipidemia 01/16/2008   GOUT 01/16/2008   Essential hypertension 01/16/2008    Past Surgical History:  Procedure Laterality Date   COLONOSCOPY     3 months ago   CORONARY ARTERY BYPASS GRAFT  2009   vessels x3   EAR CYST EXCISION N/A 07/29/2013   Procedure: excision of urachal cyst flexible cystoscopy insertion of foley cath;  Surgeon: Fredricka Bonine, MD;  Location: [facility];  Service: Urology;  Laterality: N/A;   LAPAROSCOPY N/A 07/29/2013   Procedure: LAPAROSCOPY DIAGNOSTIC  laparoscopic excision of urachal cyst ;  Surgeon: Madilyn Hook, DO;  Location: [facility];  Service: General;  Laterality: N/A;   LAPAROTOMY  08/25/2012   Procedure: EXPLORATORY LAPAROTOMY;  Surgeon: Madilyn Hook, DO;  Location: [facility];  Service: General;  Laterality: N/A;  incision and drainage abdominal wall abcessand intra abdominal wall abcess   PARTIAL GASTRECTOMY  1974   bleeding ulcers   urachal  2014   Urachal  cyst removal       Family History  Problem Relation Age of Onset   Heart disease Mother    Heart disease Sister    Colon cancer Neg Hx     Social History   Tobacco Use   Smoking status: Former    Packs/day: 1.00    Years: 10.00    Pack years: 10.00    Types: Cigarettes    Quit date: 07/27/1999    Years since quitting: 22.2   Smokeless tobacco: Never  Substance Use Topics   Alcohol use: Yes    Comment: 4 beers a day   Drug use: No    Home Medications Prior to Admission medications   Medication Sig Start Date End Date Taking? Authorizing  Provider  allopurinol (ZYLOPRIM) 100 MG tablet TAKE 1 TABLET BY MOUTH  DAILY 07/12/21   [name], MD  amLODipine (NORVASC) 10 MG tablet TAKE 1 TABLET BY MOUTH  DAILY 12/20/20   [name], MD  aspirin EC 325 MG tablet Take 325 mg by mouth every morning.    [provider]  atorvastatin (LIPITOR) 80 MG tablet TAKE 1 TABLET BY MOUTH EVERY MORNING. 10/03/21   [name], MD  Cholecalciferol (VITAMIN D3) 1000 UNITS CAPS Take 1 capsule by mouth daily. Patient not taking: Reported on 07/12/2021    [provider]  colchicine 0.6 MG tablet Take 2 tabs (1.2mg ) at fist sign of gout flare.  Then take 1 tab 1 hour later.  On day 2 take one tab daily until flare stops. 04/08/20   [name], MD  Continuous Blood Gluc Receiver (FREESTYLE LIBRE 14 DAY READER) DEVI Use as directed 04/08/20   [name], MD  Continuous Blood Gluc Sensor (FREESTYLE LIBRE SENSOR SYSTEM) MISC Apply device as directed every 2 weeks. 04/08/20   [name], MD  gabapentin (NEURONTIN) 100 MG capsule TAKE 1 CAPSULE BY MOUTH EVERYDAY AT BEDTIME 02/17/21   [name], MD  glipiZIDE (GLUCOTROL XL) 5 MG 24 hr tablet TAKE 1 TABLET BY MOUTH DAILY BEFORE BREAKFAST. 03/23/21   [name], MD  glucose blood (ONE TOUCH ULTRA TEST) test strip 1 each by Other route daily. Use as instructed 08/29/12   Marletta Lor, MD  ibuprofen (ADVIL,MOTRIN) 200 MG tablet Take 400 mg by mouth every 6 (six) hours as needed. For pain Patient not taking: Reported on 07/12/2021    [provider]  Lancets [facility] ULTRASOFT) lancets 1 each by Other route daily. Use as instructed 08/29/12   Marletta Lor, MD  lisinopril (ZESTRIL) 40 MG tablet Take 1 tablet (40 mg total) by mouth daily. 09/13/21   [name], MD  meloxicam (MOBIC) 7.5 MG tablet Take 1 tablet (7.5 mg total) by mouth daily. 06/06/18   Marletta Lor, MD  nitroGLYCERIN (NITROSTAT) 0.4 MG SL tablet Place 1 tablet (0.4 mg  total) under the tongue every 5 (five) minutes as needed for chest pain. 09/06/20   [name], MD  Omega 3-6-9 Fatty Acids (OMEGA-3 & OMEGA-6 FISH OIL PO) Take 1,000 mg by mouth daily.  Patient not taking: Reported on 07/12/2021    [provider]  sildenafil (VIAGRA) 25 MG tablet Take 1 tablet (25 mg total) by mouth daily as needed for erectile dysfunction. 05/19/21   [name], MD  tamsulosin (FLOMAX) 0.4 MG CAPS capsule TAKE 2 CAPSULES BY MOUTH EVERY DAY 07/01/21   [name], MD  traMADol (  ULTRAM) 50 MG tablet TAKE 2 TABLETS (100 MG TOTAL) BY MOUTH EVERY 12 (TWELVE) HOURS AS NEEDED. 09/28/21 10/28/21  [name], MD    Allergies    Patient has no known allergies.  Review of Systems   Review of Systems  All other systems reviewed and are negative.  Physical Exam Updated Vital Signs BP (!) 118/51    Pulse 76    Temp (!) 95.2 F (35.1 C) (Rectal)    Resp 17    SpO2 99%   Physical Exam Vitals and nursing note reviewed.  Constitutional:      Appearance: He is well-developed.  HENT:     Head: Normocephalic and atraumatic.  Cardiovascular:     Rate and Rhythm: Normal rate and regular rhythm.     Heart sounds: No murmur heard. Pulmonary:     Effort: Pulmonary effort is normal. No respiratory distress.     Breath sounds: Normal breath sounds.  Abdominal:     Palpations: Abdomen is soft.     Tenderness: There is no abdominal tenderness. There is no guarding or rebound.  Musculoskeletal:        General: No tenderness.     Comments: 2+ pitting edema to bilateral lower extremities  Skin:    General: Skin is warm and dry.  Neurological:     Mental Status: He is alert and oriented to person, place, and time.     Comments: Slow to speak.  No asymmetry of facial movements.  5 out of 5 strength in all 4 extremities with sensation to light touch intact in all 4 extremities.  Visual fields grossly intact.  No pronator drift.  Psychiatric:        Behavior:  Behavior normal.    ED Results / Procedures / Treatments   Labs (all labs ordered are listed, but only abnormal results are displayed) Labs Reviewed  CBC WITH DIFFERENTIAL/PLATELET - Abnormal; Notable for the following components:      Result Value   RBC 3.27 (*)    Hemoglobin 10.2 (*)    HCT 33.6 (*)    MCV 102.8 (*)    Lymphs Abs 0.5 (*)    All other components within normal limits  COMPREHENSIVE METABOLIC PANEL - Abnormal; Notable for the following components:   Sodium 127 (*)    Potassium 5.4 (*)    CO2 12 (*)    BUN 64 (*)    Creatinine, Ser 2.54 (*)    Calcium 8.2 (*)    Total Protein 6.3 (*)    Albumin 2.8 (*)    GFR, Estimated 26 (*)    All other components within normal limits  URINALYSIS, ROUTINE W REFLEX MICROSCOPIC - Abnormal; Notable for the following components:   Hgb urine dipstick TRACE (*)    All other components within normal limits  URINALYSIS, MICROSCOPIC (REFLEX) - Abnormal; Notable for the following components:   Bacteria, UA RARE (*)    All other components within normal limits  OSMOLALITY, URINE - Abnormal; Notable for the following components:   Osmolality, Ur 252 (*)    All other components within normal limits  I-STAT CHEM 8, ED - Abnormal; Notable for the following components:   Sodium 128 (*)    Potassium 6.4 (*)    BUN 78 (*)    Creatinine, Ser 2.80 (*)    Calcium, Ion 1.13 (*)    TCO2 13 (*)    Hemoglobin 11.6 (*)    HCT 34.0 (*)  All other components within normal limits  I-STAT VENOUS BLOOD GAS, ED - Abnormal; Notable for the following components:   pH, Ven 7.171 (*)    pCO2, Ven 31.3 (*)    pO2, Ven 65.0 (*)    Bicarbonate 11.4 (*)    TCO2 12 (*)    Acid-base deficit 16.0 (*)    Sodium 129 (*)    Potassium 5.4 (*)    HCT 30.0 (*)    Hemoglobin 10.2 (*)    All other components within normal limits  CBG MONITORING, ED - Abnormal; Notable for the following components:   Glucose-Capillary 102 (*)    All other components within  normal limits  CBG MONITORING, ED - Abnormal; Notable for the following components:   Glucose-Capillary 115 (*)    All other components within normal limits  RESP PANEL BY RT-PCR (FLU A&B, COVID) ARPGX2  CULTURE, BLOOD (ROUTINE X 2)  CULTURE, BLOOD (ROUTINE X 2)  LACTIC ACID, PLASMA  LACTIC ACID, PLASMA  SODIUM, URINE, RANDOM  OSMOLALITY  POTASSIUM  BLOOD GAS, VENOUS  CBG MONITORING, ED  CBG MONITORING, ED  TROPONIN I (HIGH SENSITIVITY)  TROPONIN I (HIGH SENSITIVITY)    EKG EKG Interpretation  Date/Time:  Sunday October 23 2021 02:32:36 EST Ventricular Rate:  71 PR Interval:  219 QRS Duration: 130 QT Interval:  423 QTC Calculation: 460 R Axis:   28 Text Interpretation: Sinus rhythm Borderline prolonged PR interval Nonspecific intraventricular conduction delay Confirmed by Quintella Reichert [phone]) on 10/23/2021 3:27:21 AM  Radiology CT Head Wo Contrast  Result Date: 10/23/2021 CLINICAL DATA:  Altered mental status, aphasia EXAM: CT HEAD WITHOUT CONTRAST TECHNIQUE: Contiguous axial images were obtained from the base of the skull through the vertex without intravenous contrast. COMPARISON:  None. FINDINGS: Brain: Normal anatomic configuration. Parenchymal volume loss is commensurate with the patient's age. Mild periventricular white matter changes are present likely reflecting the sequela of small vessel ischemia. No abnormal intra or extra-axial mass lesion or fluid collection. No abnormal mass effect or midline shift. No evidence of acute intracranial hemorrhage or infarct. Ventricular size is normal. Cerebellum unremarkable. Vascular: No asymmetric hyperdense vasculature at the skull base. Moderate atherosclerotic calcification is seen within the carotid siphons and distal vertebral arteries. Skull: Intact Sinuses/Orbits: Paranasal sinuses are clear. Orbits are unremarkable. Other: Multiple fluid-filled inferior right mastoid air cells are identified without associated osseous  erosion. Left mastoid air cells and middle ear cavities are clear. IMPRESSION: No acute intracranial abnormality. Electronically Signed   By: Fidela Salisbury M.D.   On: 10/23/2021 03:14   MR ANGIO HEAD WO CONTRAST  Result Date: 10/23/2021 CLINICAL DATA:  Neuro deficit with acute stroke suspected. Slurred speech which has improved EXAM: MRI HEAD WITHOUT CONTRAST MRA HEAD WITHOUT CONTRAST TECHNIQUE: Multiplanar, multi-echo pulse sequences of the brain and surrounding structures were acquired without intravenous contrast. Angiographic images of the Circle of Willis were acquired using MRA technique without intravenous contrast. COMPARISON:  No pertinent prior exam. FINDINGS: MRI HEAD FINDINGS Brain: No acute infarction, hemorrhage, hydrocephalus, extra-axial collection or mass lesion. Chronic small vessel ischemia in the deep white matter and in the pons. Chronic lacunar infarct in the right thalamus best seen on T2 weighted imaging. Age congruent brain volume Vascular: Arterial findings described below. Skull and upper cervical spine: Negative for marrow lesion. Some fluid is seen around the right atlantoaxial and C1-2 joints. Sinuses/Orbits: Partial right mastoid opacification with negative nasopharynx. MRA HEAD FINDINGS Anterior circulation: Extensive atheromatous irregularity and high-grade narrowing of  the bilateral carotid siphons, stenosis greatest on the right at the posterior genu and left at the anterior genu. Irregular appearance of the ICA at the skull base which is usually from bone interface artifact. Intracranial branch irregularities attributed atherosclerosis, mild when compared to the ICA disease. Negative for aneurysm Posterior circulation: Left dominant vertebral artery. Atheromatous irregularity and narrowing of the right V4 segment. No branch occlusion, beading, or aneurysm. Anatomic variants: None significant IMPRESSION: Brain MRI: 1. No acute finding such as infarct. 2. Chronic small vessel  ischemia. Intracranial MRA: 1. No emergent finding. 2. Advanced atheromatous irregularity of the carotid siphons with high-grade narrowing. Electronically Signed   By: Jorje Guild M.D.   On: 10/23/2021 06:41   MR BRAIN WO CONTRAST  Result Date: 10/23/2021 CLINICAL DATA:  Neuro deficit with acute stroke suspected. Slurred speech which has improved EXAM: MRI HEAD WITHOUT CONTRAST MRA HEAD WITHOUT CONTRAST TECHNIQUE: Multiplanar, multi-echo pulse sequences of the brain and surrounding structures were acquired without intravenous contrast. Angiographic images of the Circle of Willis were acquired using MRA technique without intravenous contrast. COMPARISON:  No pertinent prior exam. FINDINGS: MRI HEAD FINDINGS Brain: No acute infarction, hemorrhage, hydrocephalus, extra-axial collection or mass lesion. Chronic small vessel ischemia in the deep white matter and in the pons. Chronic lacunar infarct in the right thalamus best seen on T2 weighted imaging. Age congruent brain volume Vascular: Arterial findings described below. Skull and upper cervical spine: Negative for marrow lesion. Some fluid is seen around the right atlantoaxial and C1-2 joints. Sinuses/Orbits: Partial right mastoid opacification with negative nasopharynx. MRA HEAD FINDINGS Anterior circulation: Extensive atheromatous irregularity and high-grade narrowing of the bilateral carotid siphons, stenosis greatest on the right at the posterior genu and left at the anterior genu. Irregular appearance of the ICA at the skull base which is usually from bone interface artifact. Intracranial branch irregularities attributed atherosclerosis, mild when compared to the ICA disease. Negative for aneurysm Posterior circulation: Left dominant vertebral artery. Atheromatous irregularity and narrowing of the right V4 segment. No branch occlusion, beading, or aneurysm. Anatomic variants: None significant IMPRESSION: Brain MRI: 1. No acute finding such as infarct. 2.  Chronic small vessel ischemia. Intracranial MRA: 1. No emergent finding. 2. Advanced atheromatous irregularity of the carotid siphons with high-grade narrowing. Electronically Signed   By: Jorje Guild M.D.   On: 10/23/2021 06:41   US RENAL  Result Date: 10/23/2021 CLINICAL DATA:  75 year old male with history of acute renal failure. EXAM: RENAL / URINARY TRACT ULTRASOUND COMPLETE COMPARISON:  Renal ultrasound 07/08/2021. FINDINGS: Right Kidney: Renal measurements: 9.5 x 4.6 x 5.2 cm = volume: 119.9 mL. Echogenicity within normal limits. No mass or hydronephrosis visualized. Left Kidney: Renal measurements: 11.0 x 6.4 x 6.6 cm = volume: 242.6 mL. Echogenicity within normal limits. No mass or hydronephrosis visualized. Bladder: Appears normal for degree of bladder distention. Other: None. IMPRESSION: 1. Normal sonographic appearance of the kidneys bilaterally. No hydronephrosis or other acute findings. Electronically Signed   By: Vinnie Langton M.D.   On: 10/23/2021 05:17   DG Chest Port 1 View  Result Date: 10/23/2021 CLINICAL DATA:  Altered mental status EXAM: PORTABLE CHEST 1 VIEW COMPARISON:  08/21/2022 FINDINGS: Lungs are well expanded, symmetric, and clear. No pneumothorax or pleural effusion. Coronary artery bypass grafting has been performed. Cardiac size is likely within normal limits when accounting for semi-erect positioning. Pulmonary vascularity is normal. Osseous structures are age-appropriate. No acute bone abnormality. IMPRESSION: No active disease. Electronically Signed   By: Cassandria Anger  Christa See M.D.   On: 10/23/2021 03:12    Procedures Procedures   Medications Ordered in ED Medications  atorvastatin (LIPITOR) tablet 80 mg (has no administration in time range)  amLODipine (NORVASC) tablet 10 mg (has no administration in time range)  tamsulosin (FLOMAX) capsule 0.8 mg (has no administration in time range)   stroke: mapping our early stages of recovery book (has no administration in  time range)  acetaminophen (TYLENOL) tablet 650 mg (has no administration in time range)    Or  acetaminophen (TYLENOL) 160 MG/5ML solution 650 mg (has no administration in time range)    Or  acetaminophen (TYLENOL) suppository 650 mg (has no administration in time range)  senna-docusate (Senokot-S) tablet 1 tablet (has no administration in time range)  lactated ringers infusion ( Intravenous New Bag/Given 10/23/21 0648)  dextrose 50 % solution 25 mL (25 mLs Intravenous Given 10/23/21 0305)  sodium chloride 0.9 % bolus 250 mL (0 mLs Intravenous Stopped 10/23/21 0400)    ED Course  I have reviewed the triage vital signs and the nursing notes.  Pertinent labs & imaging results that were available during my care of the patient were reviewed by me and considered in my medical decision making (see chart for details).    MDM Rules/Calculators/A&P                         Patient with history of CKD, diabetes here for evaluation of hypoglycemia with malaise, speech difficulties.  Patient is mildly slow to answer questions but neurologically intact on evaluation.  He does have significant lower extremity edema.  He has borderline blood sugars in the emergency department.  Labs with acute kidney injury, hyponatremia, mild hyperkalemia.  He is also acidotic.  He was hypothermic on ED presentation, no evidence of sepsis based off of history, examination and labs.  He was treated with dextrose, IV fluids for his acute kidney injury and hyponatremia.  Unclear source of his symptoms.  He was started on a new medication, this is not available to [place] at this time.  Plan to admit for ongoing treatment and management.  Medicine consulted for admission.    Final Clinical Impression(s) / ED Diagnoses Final diagnoses:  ARF (acute renal failure) ([place])    Rx / DC Orders ED Discharge Orders     None        Quintella Reichert, MD 10/23/21 [phone]

## 2021-10-23 NOTE — Progress Notes (Signed)
°  Echocardiogram 2D Echocardiogram has been performed.  [name] 10/23/2021, 11:11 AM

## 2021-10-23 NOTE — Evaluation (Addendum)
Physical Therapy Evaluation and Discharge Patient Details Name: [name] MRN: [id number] DOB: [birth year] Today's Date: 10/23/2021  History of Present Illness  Pt is a 75 y.o. M who presents with confusion and inability to speak. Found to have hypoglycemia and hypothermia. CT head, MRI brain and MRA head all negative for acute stroke. Significant PMH: DM2, CKD stage IIIa, HTN, CAD, HLD, gout.  Clinical Impression  Patient evaluated by Physical Therapy with no further acute PT needs identified. Pt reports feeling much better, with no expressive communication deficits noted. Pt ambulating to bathroom with walker and then an additional 100 ft with no assistive device at a supervision level. SpO2 100% on RA, BP 151/57, HR 70's. Pt does present with a mild antalgic gait pattern due to chronic left knee pain. States a cane would be helpful to improve walking pattern. Also noted to have mild edema on distal BLE's (R> L). All education has been completed and the patient has no further questions. See below for any follow-up Physical Therapy or equipment needs. PT is signing off. Thank you for this referral.      Recommendations for follow up therapy are one component of a multi-disciplinary discharge planning process, led by the attending physician.  Recommendations may be updated based on patient status, additional functional criteria and insurance authorization.  Follow Up Recommendations No PT follow up    Assistance Recommended at Discharge Intermittent Supervision/Assistance  Functional Status Assessment Patient has had a recent decline in their functional status and demonstrates the ability to make significant improvements in function in a reasonable and predictable amount of time.  Equipment Recommendations  Cane    Recommendations for Other Services       Precautions / Restrictions Precautions Precautions: Fall Restrictions Weight Bearing Restrictions: No      Mobility  Bed  Mobility Overal bed mobility: Needs Assistance Bed Mobility: Supine to Sit;Sit to Supine     Supine to sit: Supervision Sit to supine: Supervision   General bed mobility comments: supervision for safety (from stretcher)    Transfers Overall transfer level: Modified independent Equipment used: Rolling walker (2 wheels);None                    Ambulation/Gait Ambulation/Gait assistance: Supervision Gait Distance (Feet): 100 Feet Assistive device: Rolling walker (2 wheels);None Gait Pattern/deviations: Step-through pattern;Decreased stride length;Antalgic;Decreased stance time - left Gait velocity: decreased     General Gait Details: Pt with mildly antalgic gait pattern which he reports is baseline due to L knee pain. Trialed with and without RW with no overt LOB  Financial trader Rankin (Stroke Patients Only)       Balance Overall balance assessment: Mild deficits observed, not formally tested                                           Pertinent Vitals/Pain Pain Assessment: Faces Faces Pain Scale: Hurts a little bit Pain Location: L knee pain (chronic) Pain Descriptors / Indicators: Discomfort Pain Intervention(s): Monitored during session    Home Living Family/patient expects to be discharged to:: Private residence Living Arrangements: Spouse/significant other Available Help at Discharge: Family Type of Home: House Home Access: Stairs to enter   Technical brewer of Steps: 1   Home Layout: One level Home Equipment: None  Prior Function Prior Level of Function : Independent/Modified Independent             Mobility Comments: works at [employer] in [place]        Extremity/Trunk Assessment   Upper Extremity Assessment Upper Extremity Assessment: Overall WFL for tasks assessed    Lower Extremity Assessment Lower Extremity Assessment: RLE  deficits/detail;LLE deficits/detail RLE Deficits / Details: Grossly 4/5. Mildly increased edema distally. LLE Deficits / Details: Grossly 4/5. Mildly increased edema distally.       Communication   Communication: No difficulties  Cognition Arousal/Alertness: Awake/alert Behavior During Therapy: WFL for tasks assessed/performed Overall Cognitive Status: Within Functional Limits for tasks assessed                                          General Comments      Exercises     Assessment/Plan    PT Assessment Patient does not need any further PT services  PT Problem List         PT Treatment Interventions      PT Goals (Current goals can be found in the Care Plan section)  Acute Rehab PT Goals Patient Stated Goal: feel better PT Goal Formulation: All assessment and education complete, DC therapy    Frequency     Barriers to discharge        Co-evaluation               AM-PAC PT "6 Clicks" Mobility  Outcome Measure Help needed turning from your back to your side while in a flat bed without using bedrails?: None Help needed moving from lying on your back to sitting on the side of a flat bed without using bedrails?: A Little Help needed moving to and from a bed to a chair (including a wheelchair)?: A Little Help needed standing up from a chair using your arms (e.g., wheelchair or bedside chair)?: None Help needed to walk in hospital room?: A Little Help needed climbing 3-5 steps with a railing? : A Little 6 Click Score: 20    End of Session   Activity Tolerance: Patient tolerated treatment well Patient left: in bed;with call bell/phone within reach Nurse Communication: Mobility status PT Visit Diagnosis: Unsteadiness on feet (R26.81)    Time: 3235-5732 PT Time Calculation (min) (ACUTE ONLY): 24 min   Charges:   PT Evaluation $PT Eval Low Complexity: 1 Low PT Treatments $Therapeutic Activity: 8-22 mins        [name], PT,  DPT Acute Rehabilitation Services Pager [phone] Office [phone]   [name] 10/23/2021, 2:35 PM

## 2021-10-23 NOTE — ED Notes (Signed)
Pt reports that he feels as though it takes him longer to get the words he wants to get out

## 2021-10-23 NOTE — ED Notes (Signed)
Pt resting in stretcher, bair hugger in place, no distress noted, denies needs. Call light in reach. Pt passed swallow screen per night shift. RN sent secure chat to Dr Doristine Bosworth re: advance diet order.

## 2021-10-23 NOTE — Progress Notes (Signed)
This is a very pleasant 75 year old gentleman with history of diabetes type 2, CKD stage IIIa, hypertension, CAD, hyperlipidemia and gout who presented to ED with complaint of inability to speak raising suspicion for stroke.  He was found to have hypoglycemia as well as hypothermia.  Underwent a stroke work-up including CT head, MRI brain and MRA head and all of them are negative for any acute stroke.  Patient seen and examined in the ED.  He is fully alert and oriented.  He has no complaints.  On examination, lungs clear to auscultation, abdomen benign and no focal deficit on neuro exam either.  He is covered with bear hugger and his temperature is already improving.  He likely had aphasia secondary to hypoglycemia.  He also seems to have hyperkalemia with potassium of 5.4 and he is getting Ringer's lactate which can worsen the hyperkalemia so I have stopped those and I have started him on normal saline for AKI on CKD stage IIIa.  His blood pressure is also low.  Lisinopril is already on hold.  I will hold amlodipine as well.  Appears to be taking glipizide at home which is on hold.  His blood sugar is now improving.  Hemoglobin A1c pending.  Does not seem to be needing SSI at this point in time.  Also has metabolic acidosis.  BMP repeat pending.

## 2021-10-23 NOTE — ED Triage Notes (Signed)
Pt BIB EMS after pt's wife noticed slurred speech and pt reported that he could get the words out that he wanted to. Pt's slurred speech resolved before improvement in CBG      Initial CBG 65 after sugary drink 91 106/41 109 HR  96 % RA

## 2021-10-24 ENCOUNTER — Other Ambulatory Visit: Payer: Self-pay

## 2021-10-24 LAB — LIPID PANEL
Cholesterol: 115 mg/dL (ref 0–200)
HDL: 63 mg/dL (ref >40)
LDL Cholesterol: 44 mg/dL (ref 0–99)
Total CHOL/HDL Ratio: 1.8 RATIO
Triglycerides: 42 mg/dL (ref <150)
VLDL: 8 mg/dL (ref 0–40)

## 2021-10-24 LAB — CBC
HCT: 30 % — ABNORMAL LOW (ref 39.0–52.0)
Hemoglobin: 9.5 g/dL — ABNORMAL LOW (ref 13.0–17.0)
MCH: 30.9 pg (ref 26.0–34.0)
MCHC: 31.7 g/dL (ref 30.0–36.0)
MCV: 97.7 fL (ref 80.0–100.0)
Platelets: 187 10*3/uL (ref 150–400)
RBC: 3.07 MIL/uL — ABNORMAL LOW (ref 4.22–5.81)
RDW: 15.2 % (ref 11.5–15.5)
WBC: 5 10*3/uL (ref 4.0–10.5)
nRBC: 0 % (ref 0.0–0.2)

## 2021-10-24 LAB — BASIC METABOLIC PANEL
Anion gap: 4 — ABNORMAL LOW (ref 5–15)
BUN: 48 mg/dL — ABNORMAL HIGH (ref 8–23)
CO2: 15 mmol/L — ABNORMAL LOW (ref 22–32)
Calcium: 8.4 mg/dL — ABNORMAL LOW (ref 8.9–10.3)
Chloride: 115 mmol/L — ABNORMAL HIGH (ref 98–111)
Creatinine, Ser: 1.82 mg/dL — ABNORMAL HIGH (ref 0.61–1.24)
GFR, Estimated: 39 mL/min — ABNORMAL LOW (ref >60)
Glucose, Bld: 169 mg/dL — ABNORMAL HIGH (ref 70–99)
Potassium: 5.3 mmol/L — ABNORMAL HIGH (ref 3.5–5.1)
Sodium: 134 mmol/L — ABNORMAL LOW (ref 135–145)

## 2021-10-24 LAB — HEMOGLOBIN A1C
Hgb A1c MFr Bld: 5.8 % — ABNORMAL HIGH (ref 4.8–5.6)
Mean Plasma Glucose: 119.76 mg/dL

## 2021-10-24 MED ORDER — AMLODIPINE BESYLATE 10 MG PO TABS
10.0000 mg | ORAL_TABLET | Freq: Every day | ORAL | Status: DC
  Administered 2021-10-24 – 2021-10-26 (×3): 10 mg via ORAL
  Filled 2021-10-24 (×3): qty 1

## 2021-10-24 MED ORDER — INFLUENZA VAC A&B SA ADJ QUAD 0.5 ML IM PRSY
0.5000 mL | PREFILLED_SYRINGE | INTRAMUSCULAR | Status: AC
  Administered 2021-10-25: 0.5 mL via INTRAMUSCULAR
  Filled 2021-10-24: qty 0.5

## 2021-10-24 MED ORDER — ASPIRIN EC 325 MG PO TBEC
325.0000 mg | DELAYED_RELEASE_TABLET | Freq: Every morning | ORAL | Status: DC
  Administered 2021-10-24 – 2021-10-25 (×2): 325 mg via ORAL
  Filled 2021-10-24 (×2): qty 1

## 2021-10-24 NOTE — Progress Notes (Signed)
PROGRESS NOTE    [name]  QIO:[id number] DOB: 04/02/1946 DOA: 10/23/2021 PCP: [name], MD   Brief Narrative:  HPI: [name] is a 75 y.o. male with medical history significant for DMT2, CKD 3, HTN, CAD, HLD, gout who went to bed around 9:30 pm last night and was feeling normal he then woke up around 1 AM and had confusion and was unable to speak.  He reports that he just was not feeling very well when he first woke up and when he tried to speak he could not get any words out so his wife called 911.  Reportedly EMS found his blood sugar to be 62 and he was given something to drink and blood sugar improved.  In the emergency room patient was able to speak was found to be mildly hypothermic.  He complained of some mild upset stomach and nausea.  He was placed on a Bair hugger and given some IV fluids.  He did not have any fever or vomiting at home.  He denies any chest pain or palpitations, denies any cough or shortness of breath.  States he is not had any sick contacts that he knows of.  He reports he was seen by his nephrologist a few weeks ago and was put on some new medications.  He has 1 was [facility] for his potassium and the other medication was for his kidneys and blood sugar but does not know the name of it.  He states that when he started taking the medication he began to have more frequent urination.    ED Course: Patient found to have hypothermia with a temperature of 93 degrees when he arrived.  He is otherwise been hemodynamically stable.  He is been placed on a Bair hugger and temperature is improving.  He is able to speak and answer all questions appropriately in the emergency room.  His aphasia has resolved.  He was given a half amp of D50 in the emergency room his blood sugars remained in the 80-90 range.  He was found to have acute kidney injury superimposed on his chronic kidney disease renal ultrasound was ordered.  He was also found to have hyponatremia of 127  potassium is mildly elevated at 5.4.  There are no EKG changes consistent with hyperkalemia.  Creatinine was 2.54 is increased from baseline of 1.24.  BUN 64 albumin 2.8 with normal LFTs.  Troponin of 15 on 2 occasions.  WBC 6800 hemoglobin 10.2 hematocrit 33.6 platelets 178,000.  COVID test is pending.  Hospitalist service been asked admit for further management  Assessment & Plan:   Principal Problem:   Acute kidney injury superimposed on chronic kidney disease ([place]) Active Problems:   Essential hypertension   DM (diabetes mellitus) type II controlled with renal manifestation (HCC)   Aphasia   Hyponatremia   Hyperkalemia   TIA (transient ischemic attack)   Metabolic acidosis  AKI on CKD stage IIIa: His baseline creatinine seems to be around 1.5, he presented with 2.54 which is improving to 1.82.  Still looks dehydrated, needs IV fluids which I will continue.  Repeat labs in the morning.  Continue IV fluid.   Aphasia/strokelike symptoms/TIA: Underwent thorough stroke work-up with CT head, MRI head and MRA negative however his carotid ultrasound shows more than 50% stenosed ECA and anterograde flow in left vertebral but retrograde flow in right vertebral.  Discussed with Dr. Gwenlyn Saran of vascular surgery who said that all these findings are clinically insignificant however  he is going to take a look at the chart.   DM (diabetes mellitus) type II: Patient initially presented with hypoglycemia, blood sugar now controlled.  Hemoglobin A1c only 5.8.  He is on glipizide at home.  Hemoglobin A1c indicates that his symptoms are likely due to hypoglycemia.  Will likely not need any glipizide at discharge.   Hyponatremia: Improving.  Very close to normal.   Essential hypertension: Only slightly elevated, will resume amlodipine but hold chlorthalidone and lisinopril while he is still recovering from AKI.   Hyperkalemia: Improving.  Repeat labs in the morning.  Metabolic acidosis: CO2 improving but he is  totally asymptomatic.  If no improvement in CO2 and kidney function and hyperkalemia, that would indicate some sort of renal disorder.  In that case, we will consider consulting nephrology.   Hypothermia: Resolved with bear hugger.   DVT prophylaxis: SCD's Start: 10/23/21 5374   Code Status: Full Code  Family Communication:  None present at bedside.  Plan of care discussed with patient in length and he verbalized understanding and agreed with it.  Status is: Inpatient  Remains inpatient appropriate because: Needs more IV fluids.  Estimated body mass index is 40.65 kg/m as calculated from the following:   Height as of this encounter: 5\' 10"  (1.778 m).   Weight as of this encounter: 128.5 kg.  Nutritional Assessment: Body mass index is 40.65 kg/m.Marland Kitchen Seen by dietician.  I agree with the assessment and plan as outlined below: Nutrition Status:   Skin Assessment: I have examined the patient's skin and I agree with the wound assessment as performed by the wound care RN as outlined below:    Consultants:  Neurology-signed off  Procedures:  None  Antimicrobials:  Anti-infectives (From admission, onward)    None          Subjective: Seen and examined.  Fluent speech.  No complaints.  Objective: Vitals:   10/24/21 0034 10/24/21 0315 10/24/21 0620 10/24/21 0953  BP: 139/70 (!) 140/59 (!) 135/59 (!) 156/64  Pulse: 95 97 79 71  Resp: 20 18 17 18   Temp: 98.7 F (37.1 C) 98.8 F (37.1 C) 98.6 F (37 C) 98 F (36.7 C)  TempSrc:  Oral    SpO2: 94% 98% 97% 100%  Weight:      Height:        Intake/Output Summary (Last 24 hours) at 10/24/2021 1400 Last data filed at 10/24/2021 1300 Gross per 24 hour  Intake 3063.83 ml  Output 2800 ml  Net 263.83 ml   Filed Weights   10/23/21 1756  Weight: 128.5 kg    Examination:  General exam: Appears calm and comfortable  Respiratory system: Clear to auscultation. Respiratory effort normal. Cardiovascular system: S1 & S2  heard, RRR. No JVD, murmurs, rubs, gallops or clicks. No pedal edema. Gastrointestinal system: Abdomen is nondistended, soft and nontender. No organomegaly or masses felt. Normal bowel sounds heard. Central nervous system: Alert and oriented. No focal neurological deficits. Extremities: Symmetric 5 x 5 power. Skin: No rashes, lesions or ulcers Psychiatry: Judgement and insight appear normal. Mood & affect appropriate.    Data Reviewed: I have personally reviewed following labs and imaging studies  CBC: Recent Labs  Lab 10/23/21 0239 10/23/21 0250 10/23/21 0400 10/24/21 0315  WBC 6.8  --   --  5.0  NEUTROABS 5.9  --   --   --   HGB 10.2* 11.6* 10.2* 9.5*  HCT 33.6* 34.0* 30.0* 30.0*  MCV 102.8*  --   --  97.7  PLT 178  --   --  924   Basic Metabolic Panel: Recent Labs  Lab 10/23/21 0239 10/23/21 0250 10/23/21 0400 10/23/21 1842 10/24/21 0315  NA 127* 128* 129* 133* 134*  K 5.4* 6.4* 5.4* 5.8* 5.3*  CL 107 109  --  114* 115*  CO2 12*  --   --  15* 15*  GLUCOSE 83 80  --  166* 169*  BUN 64* 78*  --  56* 48*  CREATININE 2.54* 2.80*  --  2.01* 1.82*  CALCIUM 8.2*  --   --  8.6* 8.4*   GFR: Estimated Creatinine Clearance: 48.7 mL/min (A) (by C-G formula based on SCr of 1.82 mg/dL (H)). Liver Function Tests: Recent Labs  Lab 10/23/21 0239  AST 28  ALT 17  ALKPHOS 97  BILITOT 0.6  PROT 6.3*  ALBUMIN 2.8*   No results for input(s): LIPASE, AMYLASE in the last 168 hours. No results for input(s): AMMONIA in the last 168 hours. Coagulation Profile: No results for input(s): INR, PROTIME in the last 168 hours. Cardiac Enzymes: No results for input(s): CKTOTAL, CKMB, CKMBINDEX, TROPONINI in the last 168 hours. BNP (last 3 results) No results for input(s): PROBNP in the last 8760 hours. HbA1C: Recent Labs    10/24/21 0315  HGBA1C 5.8*   CBG: Recent Labs  Lab 10/23/21 0251 10/23/21 0407 10/23/21 0625 10/23/21 0756 10/23/21 1241  GLUCAP 72 91 102* 115* 154*    Lipid Profile: Recent Labs    10/24/21 0315  CHOL 115  HDL 63  LDLCALC 44  TRIG 42  CHOLHDL 1.8   Thyroid Function Tests: No results for input(s): TSH, T4TOTAL, FREET4, T3FREE, THYROIDAB in the last 72 hours. Anemia Panel: No results for input(s): VITAMINB12, FOLATE, FERRITIN, TIBC, IRON, RETICCTPCT in the last 72 hours. Sepsis Labs: Recent Labs  Lab 10/23/21 0350 10/23/21 0443  LATICACIDVEN 1.9 1.4    Recent Results (from the past 240 hour(s))  Culture, blood (routine x 2)     Status: None (Preliminary result)   Collection Time: 10/23/21  2:56 AM   Specimen: BLOOD  Result Value Ref Range Status   Specimen Description BLOOD  Final   Special Requests   Final    BOTTLES DRAWN AEROBIC AND ANAEROBIC Blood Culture results may not be optimal due to an inadequate volume of blood received in culture bottles   Culture   Final    NO GROWTH 1 DAY Performed at [facility], [place] [address], [place], [place] [address]    Report Status PENDING  Incomplete  Culture, blood (routine x 2)     Status: None (Preliminary result)   Collection Time: 10/23/21  3:01 AM   Specimen: BLOOD  Result Value Ref Range Status   Specimen Description BLOOD LEFT ANTECUBITAL  Final   Special Requests   Final    BOTTLES DRAWN AEROBIC AND ANAEROBIC Blood Culture adequate volume   Culture   Final    NO GROWTH 1 DAY Performed at [facility], [place] [address], [place], [place] [address]    Report Status PENDING  Incomplete  Resp Panel by RT-PCR (Flu A&B, Covid) Nasopharyngeal Swab     Status: None   Collection Time: 10/23/21  4:32 AM   Specimen: Nasopharyngeal Swab; Nasopharyngeal(NP) swabs in vial transport medium  Result Value Ref Range Status   SARS Coronavirus 2 by RT PCR NEGATIVE NEGATIVE Final    Comment: (NOTE) SARS-CoV-2 target nucleic acids are NOT DETECTED.  The SARS-CoV-2 RNA is generally detectable in upper respiratory specimens during the acute phase of infection. The  lowest concentration of SARS-CoV-2 viral copies this assay can detect is 138 copies/mL. A negative result does not preclude SARS-Cov-2 infection and should not be used as the sole basis for treatment or other patient management decisions. A negative result may occur with  improper specimen collection/handling, submission of specimen other than nasopharyngeal swab, presence of viral mutation(s) within the areas targeted by this assay, and inadequate number of viral copies(<138 copies/mL). A negative result must be combined with clinical observations, patient history, and epidemiological information. The expected result is Negative.  Fact Sheet for Patients:  EntrepreneurPulse.com.au  Fact Sheet for Healthcare Providers:  IncredibleEmployment.be  This test is no t yet approved or cleared by the [place] FDA and  has been authorized for detection and/or diagnosis of SARS-CoV-2 by FDA under an Emergency Use Authorization (EUA). This EUA will remain  in effect (meaning this test can be used) for the duration of the COVID-19 declaration under Section 564(b)(1) of the Act, 21 U.S.C.section 360bbb-3(b)(1), unless the authorization is terminated  or revoked sooner.       Influenza A by PCR NEGATIVE NEGATIVE Final   Influenza B by PCR NEGATIVE NEGATIVE Final    Comment: (NOTE) The Xpert Xpress SARS-CoV-2/FLU/RSV plus assay is intended as an aid in the diagnosis of influenza from Nasopharyngeal swab specimens and should not be used as a sole basis for treatment. Nasal washings and aspirates are unacceptable for Xpert Xpress SARS-CoV-2/FLU/RSV testing.  Fact Sheet for Patients: EntrepreneurPulse.com.au  Fact Sheet for Healthcare Providers: IncredibleEmployment.be  This test is not yet approved or cleared by the [place] FDA and has been authorized for detection and/or diagnosis of SARS-CoV-2 by FDA under  an Emergency Use Authorization (EUA). This EUA will remain in effect (meaning this test can be used) for the duration of the COVID-19 declaration under Section 564(b)(1) of the Act, 21 U.S.C. section 360bbb-3(b)(1), unless the authorization is terminated or revoked.  Performed at [facility], [place] [address], [place], [place] [address]       Radiology Studies: CT Head Wo Contrast  Result Date: 10/23/2021 CLINICAL DATA:  Altered mental status, aphasia EXAM: CT HEAD WITHOUT CONTRAST TECHNIQUE: Contiguous axial images were obtained from the base of the skull through the vertex without intravenous contrast. COMPARISON:  None. FINDINGS: Brain: Normal anatomic configuration. Parenchymal volume loss is commensurate with the patient's age. Mild periventricular white matter changes are present likely reflecting the sequela of small vessel ischemia. No abnormal intra or extra-axial mass lesion or fluid collection. No abnormal mass effect or midline shift. No evidence of acute intracranial hemorrhage or infarct. Ventricular size is normal. Cerebellum unremarkable. Vascular: No asymmetric hyperdense vasculature at the skull base. Moderate atherosclerotic calcification is seen within the carotid siphons and distal vertebral arteries. Skull: Intact Sinuses/Orbits: Paranasal sinuses are clear. Orbits are unremarkable. Other: Multiple fluid-filled inferior right mastoid air cells are identified without associated osseous erosion. Left mastoid air cells and middle ear cavities are clear. IMPRESSION: No acute intracranial abnormality. Electronically Signed   By: Fidela Salisbury M.D.   On: 10/23/2021 03:14   MR ANGIO HEAD WO CONTRAST  Result Date: 10/23/2021 CLINICAL DATA:  Neuro deficit with acute stroke suspected. Slurred speech which has improved EXAM: MRI HEAD WITHOUT CONTRAST MRA HEAD WITHOUT CONTRAST TECHNIQUE: Multiplanar, multi-echo pulse sequences of the brain and surrounding structures were acquired  without intravenous contrast. Angiographic images of the  Circle of Willis were acquired using MRA technique without intravenous contrast. COMPARISON:  No pertinent prior exam. FINDINGS: MRI HEAD FINDINGS Brain: No acute infarction, hemorrhage, hydrocephalus, extra-axial collection or mass lesion. Chronic small vessel ischemia in the deep white matter and in the pons. Chronic lacunar infarct in the right thalamus best seen on T2 weighted imaging. Age congruent brain volume Vascular: Arterial findings described below. Skull and upper cervical spine: Negative for marrow lesion. Some fluid is seen around the right atlantoaxial and C1-2 joints. Sinuses/Orbits: Partial right mastoid opacification with negative nasopharynx. MRA HEAD FINDINGS Anterior circulation: Extensive atheromatous irregularity and high-grade narrowing of the bilateral carotid siphons, stenosis greatest on the right at the posterior genu and left at the anterior genu. Irregular appearance of the ICA at the skull base which is usually from bone interface artifact. Intracranial branch irregularities attributed atherosclerosis, mild when compared to the ICA disease. Negative for aneurysm Posterior circulation: Left dominant vertebral artery. Atheromatous irregularity and narrowing of the right V4 segment. No branch occlusion, beading, or aneurysm. Anatomic variants: None significant IMPRESSION: Brain MRI: 1. No acute finding such as infarct. 2. Chronic small vessel ischemia. Intracranial MRA: 1. No emergent finding. 2. Advanced atheromatous irregularity of the carotid siphons with high-grade narrowing. Electronically Signed   By: Jorje Guild M.D.   On: 10/23/2021 06:41   MR BRAIN WO CONTRAST  Result Date: 10/23/2021 CLINICAL DATA:  Neuro deficit with acute stroke suspected. Slurred speech which has improved EXAM: MRI HEAD WITHOUT CONTRAST MRA HEAD WITHOUT CONTRAST TECHNIQUE: Multiplanar, multi-echo pulse sequences of the brain and surrounding  structures were acquired without intravenous contrast. Angiographic images of the Circle of Willis were acquired using MRA technique without intravenous contrast. COMPARISON:  No pertinent prior exam. FINDINGS: MRI HEAD FINDINGS Brain: No acute infarction, hemorrhage, hydrocephalus, extra-axial collection or mass lesion. Chronic small vessel ischemia in the deep white matter and in the pons. Chronic lacunar infarct in the right thalamus best seen on T2 weighted imaging. Age congruent brain volume Vascular: Arterial findings described below. Skull and upper cervical spine: Negative for marrow lesion. Some fluid is seen around the right atlantoaxial and C1-2 joints. Sinuses/Orbits: Partial right mastoid opacification with negative nasopharynx. MRA HEAD FINDINGS Anterior circulation: Extensive atheromatous irregularity and high-grade narrowing of the bilateral carotid siphons, stenosis greatest on the right at the posterior genu and left at the anterior genu. Irregular appearance of the ICA at the skull base which is usually from bone interface artifact. Intracranial branch irregularities attributed atherosclerosis, mild when compared to the ICA disease. Negative for aneurysm Posterior circulation: Left dominant vertebral artery. Atheromatous irregularity and narrowing of the right V4 segment. No branch occlusion, beading, or aneurysm. Anatomic variants: None significant IMPRESSION: Brain MRI: 1. No acute finding such as infarct. 2. Chronic small vessel ischemia. Intracranial MRA: 1. No emergent finding. 2. Advanced atheromatous irregularity of the carotid siphons with high-grade narrowing. Electronically Signed   By: Jorje Guild M.D.   On: 10/23/2021 06:41   US RENAL  Result Date: 10/23/2021 CLINICAL DATA:  75 year old male with history of acute renal failure. EXAM: RENAL / URINARY TRACT ULTRASOUND COMPLETE COMPARISON:  Renal ultrasound 07/08/2021. FINDINGS: Right Kidney: Renal measurements: 9.5 x 4.6 x 5.2  cm = volume: 119.9 mL. Echogenicity within normal limits. No mass or hydronephrosis visualized. Left Kidney: Renal measurements: 11.0 x 6.4 x 6.6 cm = volume: 242.6 mL. Echogenicity within normal limits. No mass or hydronephrosis visualized. Bladder: Appears normal for degree of bladder distention. Other: None. IMPRESSION:  1. Normal sonographic appearance of the kidneys bilaterally. No hydronephrosis or other acute findings. Electronically Signed   By: Vinnie Langton M.D.   On: 10/23/2021 05:17   DG Chest Port 1 View  Result Date: 10/23/2021 CLINICAL DATA:  Altered mental status EXAM: PORTABLE CHEST 1 VIEW COMPARISON:  08/21/2022 FINDINGS: Lungs are well expanded, symmetric, and clear. No pneumothorax or pleural effusion. Coronary artery bypass grafting has been performed. Cardiac size is likely within normal limits when accounting for semi-erect positioning. Pulmonary vascularity is normal. Osseous structures are age-appropriate. No acute bone abnormality. IMPRESSION: No active disease. Electronically Signed   By: Fidela Salisbury M.D.   On: 10/23/2021 03:12   ECHOCARDIOGRAM COMPLETE  Result Date: 10/23/2021    ECHOCARDIOGRAM REPORT   Patient Name:   BOYKIN BAETZ Date of Exam: 10/23/2021 Medical Rec #:  [id number]        Height:       70.0 in Accession #:    [id number]       Weight:       298.0 lb Date of Birth:  1946-06-19        BSA:          2.473 m Patient Age:    70 years         BP:           112/60 mmHg Patient Gender: M                HR:           75 bpm. Exam Location:  Inpatient Procedure: 2D Echo Indications:    TIA  History:        Patient has no prior history of Echocardiogram examinations.                 CAD, Prior CABG; Risk Factors:Diabetes, Hypertension and                 Dyslipidemia.  Sonographer:    Johny Chess RDCS Referring Phys: [id number] Eben Burow  Sonographer Comments: Patient is morbidly obese. Image acquisition challenging due to patient body habitus.  IMPRESSIONS  1. Left ventricular ejection fraction, by estimation, is 60 to 65%. The left ventricle has normal function. The left ventricle has no regional wall motion abnormalities. Left ventricular diastolic parameters are consistent with Grade I diastolic dysfunction (impaired relaxation).  2. Right ventricular systolic function is normal. The right ventricular size is normal.  3. The mitral valve is normal in structure. No evidence of mitral valve regurgitation. No evidence of mitral stenosis.  4. The aortic valve is tricuspid. There is moderate calcification of the aortic valve. There is moderate thickening of the aortic valve. Aortic valve regurgitation is not visualized. Mild to moderate aortic valve stenosis. Aortic valve mean gradient measures 16.0 mmHg. Aortic valve Vmax measures 3.04 m/s.  5. The inferior vena cava is normal in size with greater than 50% respiratory variability, suggesting right atrial pressure of 3 mmHg. FINDINGS  Left Ventricle: Left ventricular ejection fraction, by estimation, is 60 to 65%. The left ventricle has normal function. The left ventricle has no regional wall motion abnormalities. The left ventricular internal cavity size was normal in size. There is  no left ventricular hypertrophy. Left ventricular diastolic parameters are consistent with Grade I diastolic dysfunction (impaired relaxation). Indeterminate filling pressures. Right Ventricle: The right ventricular size is normal. No increase in right ventricular wall thickness. Right ventricular systolic function is normal. Left Atrium: Left atrial size was  normal in size. Right Atrium: Right atrial size was normal in size. Pericardium: There is no evidence of pericardial effusion. Mitral Valve: The mitral valve is normal in structure. No evidence of mitral valve regurgitation. No evidence of mitral valve stenosis. Tricuspid Valve: The tricuspid valve is normal in structure. Tricuspid valve regurgitation is not demonstrated.  No evidence of tricuspid stenosis. Aortic Valve: The aortic valve is tricuspid. There is moderate calcification of the aortic valve. There is moderate thickening of the aortic valve. Aortic valve regurgitation is not visualized. Mild to moderate aortic stenosis is present. Aortic valve mean gradient measures 16.0 mmHg. Aortic valve peak gradient measures 37.0 mmHg. Aortic valve area, by VTI measures 1.77 cm. Pulmonic Valve: The pulmonic valve was normal in structure. Pulmonic valve regurgitation is not visualized. No evidence of pulmonic stenosis. Aorta: The aortic root is normal in size and structure. Venous: The inferior vena cava is normal in size with greater than 50% respiratory variability, suggesting right atrial pressure of 3 mmHg. IAS/Shunts: No atrial level shunt detected by color flow Doppler.  LEFT VENTRICLE PLAX 2D LVIDd:         5.60 cm      Diastology LVIDs:         4.10 cm      LV e' medial:    7.62 cm/s LV PW:         0.80 cm      LV E/e' medial:  13.1 LV IVS:        0.90 cm      LV e' lateral:   16.30 cm/s LVOT diam:     2.10 cm      LV E/e' lateral: 6.1 LV SV:         90 LV SV Index:   37 LVOT Area:     3.46 cm  LV Volumes (MOD) LV vol d, MOD A4C: 138.0 ml LV vol s, MOD A4C: 62.4 ml LV SV MOD A4C:     138.0 ml IVC IVC diam: 1.40 cm LEFT ATRIUM             Index        RIGHT ATRIUM           Index LA diam:        3.50 cm 1.42 cm/m   RA Area:     17.90 cm LA Vol (A2C):   82.1 ml 33.20 ml/m  RA Volume:   45.00 ml  18.20 ml/m LA Vol (A4C):   66.2 ml 26.77 ml/m LA Biplane Vol: 77.7 ml 31.42 ml/m  AORTIC VALVE AV Area (Vmax):    1.64 cm AV Area (Vmean):   1.64 cm AV Area (VTI):     1.77 cm AV Vmax:           304.00 cm/s AV Vmean:          182.000 cm/s AV VTI:            0.512 m AV Peak Grad:      37.0 mmHg AV Mean Grad:      16.0 mmHg LVOT Vmax:         144.00 cm/s LVOT Vmean:        86.400 cm/s LVOT VTI:          0.261 m LVOT/AV VTI ratio: 0.51  AORTA Ao Root diam: 3.30 cm Ao Asc diam:  3.30  cm MITRAL VALVE MV Area (PHT): 3.60 cm     SHUNTS MV Decel Time: 211  msec     Systemic VTI:  0.26 m MV E velocity: 100.00 cm/s  Systemic Diam: 2.10 cm MV A velocity: 107.00 cm/s MV E/A ratio:  0.93 Mihai Croitoru MD Electronically signed by Sanda Klein MD Signature Date/Time: 10/23/2021/2:17:55 PM    Final    VAS US CAROTID (at [facility] and [facility] only)  Result Date: 10/23/2021 Carotid Arterial Duplex Study Patient Name:  SHAMAR ENGELMANN  Date of Exam:   10/23/2021 Medical Rec #: [id number]         Accession #:    [id number] Date of Birth: 10-13-1946         Patient Gender: M Patient Age:   23 years Exam Location:  [facility] Procedure:      VAS US CAROTID Referring Phys: Harrold Donath --------------------------------------------------------------------------------  Indications:       Altered mental status. Risk Factors:      Hypertension, hyperlipidemia, Diabetes, coronary artery                    disease. Other Factors:     Patient presenting with hypoglycemia and hypothermia. Limitations        Today's exam was limited due to the body habitus of the                    patient and the patient's respiratory variation. Comparison Study:  Prior carotid duplex done 07/31/2019 Performing Technologist: Sharion Dove RVS  Examination Guidelines: A complete evaluation includes B-mode imaging, spectral Doppler, color Doppler, and power Doppler as needed of all accessible portions of each vessel. Bilateral testing is considered an integral part of a complete examination. Limited examinations for reoccurring indications may be performed as noted.  Right Carotid Findings: +----------+--------+--------+--------+------------------+--------+             PSV cm/s EDV cm/s Stenosis Plaque Description Comments  +----------+--------+--------+--------+------------------+--------+  CCA Prox   81       21                                             +----------+--------+--------+--------+------------------+--------+  CCA  Distal 118      27                                             +----------+--------+--------+--------+------------------+--------+  ICA Prox   120      25       1-39%    calcific                     +----------+--------+--------+--------+------------------+--------+  ICA Distal 95       24                                             +----------+--------+--------+--------+------------------+--------+  ECA        443      71       >50%     calcific                     +----------+--------+--------+--------+------------------+--------+ +----------+--------+-------+--------+-------------------+             PSV  cm/s EDV cms Describe Arm Pressure (mmHG)  +----------+--------+-------+--------+-------------------+  Subclavian 166                                            +----------+--------+-------+--------+-------------------+ +---------+--------+--------+----------+  Vertebral PSV cm/s EDV cm/s Retrograde  +---------+--------+--------+----------+  Left Carotid Findings: +----------+--------+--------+--------+------------------+-------------------+             PSV cm/s EDV cm/s Stenosis Plaque Description Comments             +----------+--------+--------+--------+------------------+-------------------+  CCA Prox   103      22                                                        +----------+--------+--------+--------+------------------+-------------------+  CCA Distal 91       27                                                        +----------+--------+--------+--------+------------------+-------------------+  ICA Prox   149      48       1-39%    calcific                                +----------+--------+--------+--------+------------------+-------------------+  ICA Distal 98       22                                                        +----------+--------+--------+--------+------------------+-------------------+  ECA                                                      not well visualized   +----------+--------+--------+--------+------------------+-------------------+ +----------+--------+--------+--------+-------------------+             PSV cm/s EDV cm/s Describe Arm Pressure (mmHG)  +----------+--------+--------+--------+-------------------+  Subclavian 155                                             +----------+--------+--------+--------+-------------------+ +---------+--------+--+--------+--+  Vertebral PSV cm/s 80 EDV cm/s 18  +---------+--------+--+--------+--+   Summary: Right Carotid: Velocities in the right ICA are consistent with a 1-39% stenosis.                The ECA appears >50% stenosed. Left Carotid: Velocities in the left ICA are consistent with a 1-39% stenosis. Vertebrals:  Left vertebral artery demonstrates antegrade flow. Right vertebral              artery demonstrates retrograde flow. Subclavians: Normal flow hemodynamics were seen in bilateral subclavian  arteries. *See table(s) above for measurements and observations.  Electronically signed by Orlie Pollen on 10/23/2021 at 12:05:10 PM.    Final     Scheduled Meds:  atorvastatin  80 mg Oral q morning   [START ON 10/25/2021] influenza vaccine adjuvanted  0.5 mL Intramuscular Tomorrow-1000   tamsulosin  0.8 mg Oral Daily   Continuous Infusions:  sodium chloride 125 mL/hr at 10/24/21 0500     LOS: 1 day   Time spent: 30 minutes   Darliss Cheney, MD [facility]  10/24/2021, 2:00 PM  Please page via [place] and do not message via secure chat for anything urgent. Secure chat can be used for anything non urgent.  How to contact the [facility] Attending or Consulting provider [place] or covering provider during after hours [place], for this patient?  Check the care team in [facility] and look for a) attending/consulting TRH provider listed and b) the [facility] team listed. Page or secure chat [phone]. Log into [email or web address] and use [place]'s universal password to access. If you do not have the password, please  contact the hospital operator. Locate the [facility] provider you are looking for under [facility] and page to a number that you can be directly reached. If you still have difficulty reaching the provider, please page the [facility] (Director on Call) for the Hospitalists listed on amion for assistance.

## 2021-10-24 NOTE — Progress Notes (Signed)
SLP Deferral of Evaluation  Patient Details Name: [name] MRN: [id number] DOB: 1945/11/27   Martin Majestic to see pt for cognitive linguistic evaluation.  Pt was not having any difficulty with word finding in conversation.  Pt's speech is clear and free from dysarthria.  Pt acknowledged speech language difficulties precipitating admission.  He states that he has returned to baseline.  Head CT without acute findings.  Given resolution of symptoms and negative brain imaging, will defer cognitive linguistic evaluation at this time.  If there is a change in pt status, please re-consult ST at that time.   Celedonio Savage, [place], [place] Office: [phone] 10/24/2021, 12:19 PM

## 2021-10-24 NOTE — Evaluation (Signed)
Occupational Therapy Evaluation Patient Details Name: [name] MRN: [id number] DOB: 1945/12/16 Today's Date: 10/24/2021   History of Present Illness 75 y.o. M who presents with confusion and inability to speak. Found to have hypoglycemia and hypothermia. CT head, MRI brain and MRA head all negative for acute stroke. Significant PMH: DM2, CKD stage IIIa, HTN, CAD, HLD, gout.   Clinical Impression   PTA, pt was living with his wife and was independent and working at [employer] in the [employer]. Pt currently requiring Supervision-Min Guard A for ADLs and functional mobility. Pt reporting he has times "where my left knee really hurts" and then times when it is fine; pt describing similar symptoms to gout; notified RN. Pt very agreeable to therapy and performing mobility in hallway with and without RW. Pt would benefit from further acute OT to facilitate safe dc and providing education on EC and DME. Recommend dc to home once medically stable per physician.         Recommendations for follow up therapy are one component of a multi-disciplinary discharge planning process, led by the attending physician.  Recommendations may be updated based on patient status, additional functional criteria and insurance authorization.   Follow Up Recommendations  No OT follow up    Assistance Recommended at Discharge None  Functional Status Assessment  Patient has had a recent decline in their functional status and demonstrates the ability to make significant improvements in function in a reasonable and predictable amount of time.  Equipment Recommendations  BSC/3in1;Other (comment) Kasandra Knudsen)    Recommendations for Other Services       Precautions / Restrictions Precautions Precautions: Fall      Mobility Bed Mobility Overal bed mobility: Needs Assistance Bed Mobility: Supine to Sit;Sit to Supine     Supine to sit: Supervision Sit to supine: Supervision   General bed mobility comments:  supervision for safety (from stretcher)    Transfers Overall transfer level: Needs assistance Equipment used: None Transfers: Sit to/from Stand Sit to Stand: Supervision           General transfer comment: Supervision for safety. Slow moving due to pain at L knee      Balance Overall balance assessment: Mild deficits observed, not formally tested                                         ADL either performed or assessed with clinical judgement   ADL Overall ADL's : Needs assistance/impaired Eating/Feeding: Set up;Sitting   Grooming: Supervision/safety;Wash/dry hands;Standing Grooming Details (indicate cue type and reason): Close supervision for grooming at sink Upper Body Bathing: Set up;Sitting   Lower Body Bathing: Min guard;Sit to/from stand   Upper Body Dressing : Set up;Sitting   Lower Body Dressing: Min guard;Sit to/from stand   Toilet Transfer: Supervision/safety;Ambulation           Functional mobility during ADLs: Min guard;Supervision/safety;Rolling walker (2 wheels) General ADL Comments: Pt performing toileting, hand hygiene, and mobility in hallway. With RW, supervision. without RW, MIn Guard A.     Vision         Perception     Praxis      Pertinent Vitals/Pain Pain Assessment: Faces Faces Pain Scale: Hurts a little bit Pain Location: L knee pain (chronic) Pain Descriptors / Indicators: Discomfort Pain Intervention(s): Monitored during session;Limited activity within patient's tolerance;Repositioned     Hand Dominance  Extremity/Trunk Assessment Upper Extremity Assessment Upper Extremity Assessment: Overall WFL for tasks assessed   Lower Extremity Assessment Lower Extremity Assessment: Defer to PT evaluation RLE Deficits / Details: Grossly 4/5. Mildly increased edema distally. LLE Deficits / Details: Grossly 4/5. Mildly increased edema distally.   Cervical / Trunk Assessment Cervical / Trunk Assessment: Normal    Communication Communication Communication: No difficulties   Cognition Arousal/Alertness: Awake/alert Behavior During Therapy: WFL for tasks assessed/performed Overall Cognitive Status: Within Functional Limits for tasks assessed                                       General Comments       Exercises     Shoulder Instructions      Home Living Family/patient expects to be discharged to:: Private residence Living Arrangements: Spouse/significant other Available Help at Discharge: Family Type of Home: House Home Access: Stairs to enter Technical brewer of Steps: 1   Home Layout: One level     Bathroom Shower/Tub: Occupational psychologist: Standard     Home Equipment: Conservation officer, nature (2 wheels)          Prior Functioning/Environment Prior Level of Function : Independent/Modified Independent             Mobility Comments: Works at [employer] in [employer] Problem List: Decreased strength;Decreased range of motion;Decreased activity tolerance;Impaired balance (sitting and/or standing);Decreased knowledge of precautions;Decreased knowledge of use of DME or AE;Decreased safety awareness;Pain      OT Treatment/Interventions: Self-care/ADL training;Therapeutic exercise;Energy conservation;DME and/or AE instruction;Therapeutic activities;Patient/family education    OT Goals(Current goals can be found in the care plan section) Acute Rehab OT Goals Patient Stated Goal: Go home soon OT Goal Formulation: With patient Time For Goal Achievement: 11/07/21 Potential to Achieve Goals: Good  OT Frequency: Min 2X/week   Barriers to D/C:            Co-evaluation              AM-PAC OT "6 Clicks" Daily Activity     Outcome Measure Help from another person eating meals?: None Help from another person taking care of personal grooming?: None Help from another person toileting, which includes using toliet, bedpan, or urinal?: A  Little Help from another person bathing (including washing, rinsing, drying)?: A Little Help from another person to put on and taking off regular upper body clothing?: None Help from another person to put on and taking off regular lower body clothing?: A Little 6 Click Score: 21   End of Session Equipment Utilized During Treatment: Rolling walker (2 wheels);Gait belt Nurse Communication: Mobility status  Activity Tolerance: Patient tolerated treatment well Patient left: in chair;with call bell/phone within reach;with chair alarm set  OT Visit Diagnosis: Unsteadiness on feet (R26.81);Other abnormalities of gait and mobility (R26.89);Muscle weakness (generalized) (M62.81);Pain Pain - Right/Left: Left Pain - part of body: Knee                Time: 8338-2505 OT Time Calculation (min): 29 min Charges:  OT General Charges $OT Visit: 1 Visit OT Evaluation $OT Eval Low Complexity: 1 Low OT Treatments $Self Care/Home Management : 8-22 mins  Taison Celani MSOT, OTR/L Acute Rehab Pager: [phone] Office: [place] 10/24/2021, 9:45 AM

## 2021-10-24 NOTE — TOC Initial Note (Addendum)
Transition of Care [facility]) - Initial/Assessment Note    Patient Details  Name: [name] MRN: [id number] Date of Birth: [birth year]  [facility] CM/SW Contact:    Tom-Johnson, Renea Ee, RN Phone Number: 10/24/2021, 10:13 AM  Clinical Narrative:                  Transition of Care (TOC) Screening Note   Patient Details  Name: [name] Date of Birth: 02-25-1946   Transition of Care [facility]) CM/SW Contact:    Tom-Johnson, Renea Ee, RN Phone Number: 10/24/2021, 10:13 AM  Patient is from home with wife. Presented to the ED with difficulty speaking. Found to be hypothermic and AKI. No followup from PT/OT. Transition of Care Department [facility]) has reviewed patient and no TOC needs have been identified at this time. We will continue to monitor patient advancement through interdisciplinary progression rounds. If new patient transition needs arise, please place a TOC consult.    Expected Discharge Plan: Home/Self Care Barriers to Discharge: Continued Medical Work up   Patient Goals and CMS Choice Patient states their goals for this hospitalization and ongoing recovery are:: To go home CMS Medicare.gov Compare Post Acute Care list provided to:: Patient Choice offered to / list presented to : NA  Expected Discharge Plan and Services Expected Discharge Plan: Home/Self Care   Discharge Planning Services: CM Consult Post Acute Care Choice: NA Living arrangements for the past 2 months: Single Family Home                 DME Arranged: N/A DME Agency: NA       HH Arranged: NA HH Agency: NA        Prior Living Arrangements/Services Living arrangements for the past 2 months: Single Family Home Lives with:: Spouse Patient language and need for interpreter reviewed:: Yes Do you feel safe going back to the place where you live?: Yes      Need for Family Participation in Patient Care: Yes (Comment) Care giver support system in place?: Yes  (comment)   Criminal Activity/Legal Involvement Pertinent to Current Situation/Hospitalization: No - Comment as needed  Activities of Daily Living      Permission Sought/Granted Permission sought to share information with : Case Manager, Family Supports Permission granted to share information with : Yes, Verbal Permission Granted              Emotional Assessment Appearance:: Appears stated age Attitude/Demeanor/Rapport: Engaged, Gracious Affect (typically observed): Accepting, Appropriate, Calm, Hopeful Orientation: : Oriented to Self, Oriented to Place, Oriented to  Time, Oriented to Situation Alcohol / Substance Use: Not Applicable Psych Involvement: No (comment)  Admission diagnosis:  ARF (acute renal failure) ([place]) [N17.9] Acute kidney injury superimposed on chronic kidney disease ([place]) [N17.9, N18.9] Patient Active Problem List   Diagnosis Date Noted   Acute kidney injury superimposed on chronic kidney disease (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 07/26/2012   HERPES ZOSTER 04/15/2010   ERECTILE DYSFUNCTION 01/14/2009  Occlusion and stenosis of carotid artery 09/22/2008   Coronary atherosclerosis 04/03/2008   CAD, ARTERY BYPASS GRAFT 02/10/2008   Dyslipidemia 01/16/2008   GOUT 01/16/2008   Essential hypertension 01/16/2008   PCP:  [name], MD Pharmacy:   [id number] - [place], [place] [address] Phone: [phone] Fax: [phone]  [facility] ([place], [place] [facility] [address] [place] [address] [place] [address] Phone: [phone] Fax: [phone]  [facility] ([facility] ) - [place], [place] [address] Phone: [phone] Fax: [phone]     Social Determinants of Health (SDOH) Interventions    Readmission Risk Interventions No flowsheet data found.

## 2021-10-25 LAB — POTASSIUM: Potassium: 5.4 mmol/L — ABNORMAL HIGH (ref 3.5–5.1)

## 2021-10-25 LAB — COMPREHENSIVE METABOLIC PANEL
ALT: 20 U/L (ref 0–44)
AST: 26 U/L (ref 15–41)
Albumin: 2.6 g/dL — ABNORMAL LOW (ref 3.5–5.0)
Alkaline Phosphatase: 76 U/L (ref 38–126)
Anion gap: 5 (ref 5–15)
BUN: 28 mg/dL — ABNORMAL HIGH (ref 8–23)
CO2: 15 mmol/L — ABNORMAL LOW (ref 22–32)
Calcium: 8.7 mg/dL — ABNORMAL LOW (ref 8.9–10.3)
Chloride: 115 mmol/L — ABNORMAL HIGH (ref 98–111)
Creatinine, Ser: 1.3 mg/dL — ABNORMAL HIGH (ref 0.61–1.24)
GFR, Estimated: 58 mL/min — ABNORMAL LOW (ref >60)
Glucose, Bld: 140 mg/dL — ABNORMAL HIGH (ref 70–99)
Potassium: 5.4 mmol/L — ABNORMAL HIGH (ref 3.5–5.1)
Sodium: 135 mmol/L (ref 135–145)
Total Bilirubin: 0.8 mg/dL (ref 0.3–1.2)
Total Protein: 6.2 g/dL — ABNORMAL LOW (ref 6.5–8.1)

## 2021-10-25 LAB — MAGNESIUM: Magnesium: 1.8 mg/dL (ref 1.7–2.4)

## 2021-10-25 MED ORDER — INSULIN ASPART 100 UNIT/ML IV SOLN
10.0000 [IU] | Freq: Once | INTRAVENOUS | Status: AC
  Administered 2021-10-25: 10 [IU] via INTRAVENOUS

## 2021-10-25 MED ORDER — ASPIRIN EC 81 MG PO TBEC
81.0000 mg | DELAYED_RELEASE_TABLET | Freq: Every day | ORAL | Status: DC
  Administered 2021-10-26: 81 mg via ORAL
  Filled 2021-10-25: qty 1

## 2021-10-25 MED ORDER — INSULIN REGULAR BOLUS VIA INFUSION: 10.0000 [IU] | Freq: Once | INTRAVENOUS | Status: DC

## 2021-10-25 MED ORDER — CLOPIDOGREL BISULFATE 75 MG PO TABS
75.0000 mg | ORAL_TABLET | Freq: Every day | ORAL | Status: DC
  Administered 2021-10-25 – 2021-10-26 (×2): 75 mg via ORAL
  Filled 2021-10-25 (×2): qty 1

## 2021-10-25 MED ORDER — SODIUM BICARBONATE 650 MG PO TABS
650.0000 mg | ORAL_TABLET | Freq: Two times a day (BID) | ORAL | Status: DC
  Administered 2021-10-25 – 2021-10-26 (×3): 650 mg via ORAL
  Filled 2021-10-25 (×3): qty 1

## 2021-10-25 MED ORDER — DEXTROSE 50 % IV SOLN
1.0000 | Freq: Once | INTRAVENOUS | Status: AC
  Administered 2021-10-25: 50 mL via INTRAVENOUS
  Filled 2021-10-25: qty 50

## 2021-10-25 NOTE — Progress Notes (Signed)
EEG complete - results pending 

## 2021-10-25 NOTE — Progress Notes (Signed)
PROGRESS NOTE    [name]  QQI:[id number] DOB: 09-May-1946 DOA: 10/23/2021 PCP: [name], MD   Brief Narrative:  [name] is a 75 y.o. male with medical history significant for DMT2, CKD 3, HTN, CAD, HLD, gout was brought into the emergency department with inability to speak.  EMS was called . reportedly EMS found his blood sugar to be 62 and he was given something to drink and blood sugar improved.  In the emergency room patient was able to speak was found to be mildly hypothermic with a temperature of 76F. He was placed on a Bair hugger and given some IV fluids. He was given a half amp of D50 in the emergency room his blood sugars remained in the 80-90 range.  He also had some hyponatremia and hyperkalemia as well as AKI on CKD stage IIIa.  He was admitted under hospitalist service.  Neurology consulted.  Assessment & Plan:   Principal Problem:   Acute kidney injury superimposed on chronic kidney disease ([place]) Active Problems:   Essential hypertension   DM (diabetes mellitus) type II controlled with renal manifestation (HCC)   Aphasia   Hyponatremia   Hyperkalemia   TIA (transient ischemic attack)   Metabolic acidosis  AKI on CKD stage IIIa: His creatinine is now back to his baseline however he continues to have metabolic acidosis and hyperkalemia.   Aphasia/strokelike symptoms/TIA: Underwent thorough stroke work-up with CT head, MRI head and MRA negative however his carotid ultrasound shows more than 50% stenosed ECA and anterograde flow in left vertebral but retrograde flow in right vertebral.  Discussed with Dr. Donzetta Matters of vascular surgery who said that all these findings are clinically insignificant.  However MRA brain showed Anterior circulation: Extensive atheromatous irregularity and high-grade narrowing of the bilateral carotid siphons, stenosis greatest on the right at the posterior genu and left at the anterior genu. Irregular appearance of the ICA at the  skull base which is usually from bone interface artifact. Intracranial branch irregularities attributed atherosclerosis, mild when compared to the ICA disease. Negative for aneurysm.   Posterior circulation: Left dominant vertebral artery. Atheromatous irregularity and narrowing of the right V4 segment. No branch occlusion, beading, or aneurysm. Discussed case with neurology Dr. Leonie Man who recommends starting patient on DAPT for 3 months and then aspirin alone.  Just medical management.  Since his stenosis is asymptomatic.   DM (diabetes mellitus) type II: Patient initially presented with hypoglycemia, blood sugar now controlled.  Hemoglobin A1c only 5.8.  He is on glipizide at home.  Hemoglobin A1c indicates that his symptoms are likely due to hypoglycemia.  Will likely not need any glipizide at discharge.   Hyponatremia: Resolved.   Essential hypertension: Only slightly elevated, will continue amlodipine but hold chlorthalidone and lisinopril for another day.   Hyperkalemia: Still elevated at 5.4.  Will reduce IV fluids to 50 cc/h.  We will recheck potassium later today and tomorrow morning.  Metabolic acidosis: CO2 stable at 15.  Will start on sodium bicarb tablets and recheck in the morning.   Hypothermia: Resolved with bear hugger.   DVT prophylaxis: SCD's Start: 10/23/21 9417   Code Status: Full Code  Family Communication:  None present at bedside.  Plan of care discussed with patient in length and he verbalized understanding and agreed with it.  Status is: Inpatient  Remains inpatient appropriate because: Needs more IV fluids.  Estimated body mass index is 40.65 kg/m as calculated from the following:   Height as  of this encounter: 5\' 10"  (1.778 m).   Weight as of this encounter: 128.5 kg.  Nutritional Assessment: Body mass index is 40.65 kg/m.Marland Kitchen Seen by dietician.  I agree with the assessment and plan as outlined below: Nutrition Status:   Skin Assessment: I have  examined the patient's skin and I agree with the wound assessment as performed by the wound care RN as outlined below:    Consultants:  Neurology-signed off  Procedures:  None  Antimicrobials:  Anti-infectives (From admission, onward)    None          Subjective:  Seen and examined.  He has no complaints.  He has chronic left knee pain but he is not concerned about that.  Objective: Vitals:   10/24/21 1830 10/24/21 2154 10/25/21 0615 10/25/21 0837  BP: (!) 172/77 (!) 155/77 (!) 160/79 (!) 149/79  Pulse: 79 73 73 76  Resp: 20 18 18 17   Temp: 98.4 F (36.9 C) 98.2 F (36.8 C) 98.5 F (36.9 C) 98.8 F (37.1 C)  TempSrc: Oral Oral Oral Oral  SpO2: 96% 98% 100% 98%  Weight:      Height:        Intake/Output Summary (Last 24 hours) at 10/25/2021 0921 Last data filed at 10/25/2021 0800 Gross per 24 hour  Intake 3974.12 ml  Output 1300 ml  Net 2674.12 ml    Filed Weights   10/23/21 1756  Weight: 128.5 kg    Examination:  General exam: Appears calm and comfortable  Respiratory system: Clear to auscultation. Respiratory effort normal. Cardiovascular system: S1 & S2 heard, RRR. No JVD, murmurs, rubs, gallops or clicks. No pedal edema. Gastrointestinal system: Abdomen is nondistended, soft and nontender. No organomegaly or masses felt. Normal bowel sounds heard. Central nervous system: Alert and oriented. No focal neurological deficits. Extremities: Symmetric 5 x 5 power. Skin: No rashes, lesions or ulcers.  Psychiatry: Judgement and insight appear normal. Mood & affect appropriate.    Data Reviewed: I have personally reviewed following labs and imaging studies  CBC: Recent Labs  Lab 10/23/21 0239 10/23/21 0250 10/23/21 0400 10/24/21 0315  WBC 6.8  --   --  5.0  NEUTROABS 5.9  --   --   --   HGB 10.2* 11.6* 10.2* 9.5*  HCT 33.6* 34.0* 30.0* 30.0*  MCV 102.8*  --   --  97.7  PLT 178  --   --  409    Basic Metabolic Panel: Recent Labs  Lab  10/23/21 0239 10/23/21 0250 10/23/21 0400 10/23/21 1842 10/24/21 0315 10/25/21 0338  NA 127* 128* 129* 133* 134* 135  K 5.4* 6.4* 5.4* 5.8* 5.3* 5.4*  CL 107 109  --  114* 115* 115*  CO2 12*  --   --  15* 15* 15*  GLUCOSE 83 80  --  166* 169* 140*  BUN 64* 78*  --  56* 48* 28*  CREATININE 2.54* 2.80*  --  2.01* 1.82* 1.30*  CALCIUM 8.2*  --   --  8.6* 8.4* 8.7*  MG  --   --   --   --   --  1.8    GFR: Estimated Creatinine Clearance: 68.1 mL/min (A) (by C-G formula based on SCr of 1.3 mg/dL (H)). Liver Function Tests: Recent Labs  Lab 10/23/21 0239 10/25/21 0338  AST 28 26  ALT 17 20  ALKPHOS 97 76  BILITOT 0.6 0.8  PROT 6.3* 6.2*  ALBUMIN 2.8* 2.6*    No results for input(s): LIPASE,  AMYLASE in the last 168 hours. No results for input(s): AMMONIA in the last 168 hours. Coagulation Profile: No results for input(s): INR, PROTIME in the last 168 hours. Cardiac Enzymes: No results for input(s): CKTOTAL, CKMB, CKMBINDEX, TROPONINI in the last 168 hours. BNP (last 3 results) No results for input(s): PROBNP in the last 8760 hours. HbA1C: Recent Labs    10/24/21 0315  HGBA1C 5.8*    CBG: Recent Labs  Lab 10/23/21 0251 10/23/21 0407 10/23/21 0625 10/23/21 0756 10/23/21 1241  GLUCAP 72 91 102* 115* 154*    Lipid Profile: Recent Labs    10/24/21 0315  CHOL 115  HDL 63  LDLCALC 44  TRIG 42  CHOLHDL 1.8    Thyroid Function Tests: No results for input(s): TSH, T4TOTAL, FREET4, T3FREE, THYROIDAB in the last 72 hours. Anemia Panel: No results for input(s): VITAMINB12, FOLATE, FERRITIN, TIBC, IRON, RETICCTPCT in the last 72 hours. Sepsis Labs: Recent Labs  Lab 10/23/21 0350 10/23/21 0443  LATICACIDVEN 1.9 1.4     Recent Results (from the past 240 hour(s))  Culture, blood (routine x 2)     Status: None (Preliminary result)   Collection Time: 10/23/21  2:56 AM   Specimen: BLOOD  Result Value Ref Range Status   Specimen Description BLOOD  Final    Special Requests   Final    BOTTLES DRAWN AEROBIC AND ANAEROBIC Blood Culture results may not be optimal due to an inadequate volume of blood received in culture bottles   Culture   Final    NO GROWTH 2 DAYS Performed at [facility], [place] [address] [place], [place] [address]    Report Status PENDING  Incomplete  Culture, blood (routine x 2)     Status: None (Preliminary result)   Collection Time: 10/23/21  3:01 AM   Specimen: BLOOD  Result Value Ref Range Status   Specimen Description BLOOD LEFT ANTECUBITAL  Final   Special Requests   Final    BOTTLES DRAWN AEROBIC AND ANAEROBIC Blood Culture adequate volume   Culture   Final    NO GROWTH 2 DAYS Performed at [facility], [place] [address] [place], [place] [address]    Report Status PENDING  Incomplete  Resp Panel by RT-PCR (Flu A&B, Covid) Nasopharyngeal Swab     Status: None   Collection Time: 10/23/21  4:32 AM   Specimen: Nasopharyngeal Swab; Nasopharyngeal(NP) swabs in vial transport medium  Result Value Ref Range Status   SARS Coronavirus 2 by RT PCR NEGATIVE NEGATIVE Final    Comment: (NOTE) SARS-CoV-2 target nucleic acids are NOT DETECTED.  The SARS-CoV-2 RNA is generally detectable in upper respiratory specimens during the acute phase of infection. The lowest concentration of SARS-CoV-2 viral copies this assay can detect is 138 copies/mL. A negative result does not preclude SARS-Cov-2 infection and should not be used as the sole basis for treatment or other patient management decisions. A negative result may occur with  improper specimen collection/handling, submission of specimen other than nasopharyngeal swab, presence of viral mutation(s) within the areas targeted by this assay, and inadequate number of viral copies(<138 copies/mL). A negative result must be combined with clinical observations, patient history, and epidemiological information. The expected result is Negative.  Fact Sheet for Patients:   EntrepreneurPulse.com.au  Fact Sheet for Healthcare Providers:  IncredibleEmployment.be  This test is no t yet approved or cleared by the [place] FDA and  has been authorized for detection and/or diagnosis of SARS-CoV-2 by  FDA under an Emergency Use Authorization (EUA). This EUA will remain  in effect (meaning this test can be used) for the duration of the COVID-19 declaration under Section 564(b)(1) of the Act, 21 U.S.C.section 360bbb-3(b)(1), unless the authorization is terminated  or revoked sooner.       Influenza A by PCR NEGATIVE NEGATIVE Final   Influenza B by PCR NEGATIVE NEGATIVE Final    Comment: (NOTE) The Xpert Xpress SARS-CoV-2/FLU/RSV plus assay is intended as an aid in the diagnosis of influenza from Nasopharyngeal swab specimens and should not be used as a sole basis for treatment. Nasal washings and aspirates are unacceptable for Xpert Xpress SARS-CoV-2/FLU/RSV testing.  Fact Sheet for Patients: EntrepreneurPulse.com.au  Fact Sheet for Healthcare Providers: IncredibleEmployment.be  This test is not yet approved or cleared by the [place] FDA and has been authorized for detection and/or diagnosis of SARS-CoV-2 by FDA under an Emergency Use Authorization (EUA). This EUA will remain in effect (meaning this test can be used) for the duration of the COVID-19 declaration under Section 564(b)(1) of the Act, 21 U.S.C. section 360bbb-3(b)(1), unless the authorization is terminated or revoked.  Performed at [facility], [place] [address] [place], [place] [address]        Radiology Studies: ECHOCARDIOGRAM COMPLETE  Result Date: 10/23/2021    ECHOCARDIOGRAM REPORT   Patient Name:   [name] Date of Exam: 10/23/2021 Medical Rec #:  [id number]        Height:       70.0 in Accession #:    [id number]       Weight:       298.0 lb Date of Birth:  04/14/1946        BSA:           2.473 m Patient Age:    21 years         BP:           112/60 mmHg Patient Gender: M                HR:           75 bpm. Exam Location:  Inpatient Procedure: 2D Echo Indications:    TIA  History:        Patient has no prior history of Echocardiogram examinations.                 CAD, Prior CABG; Risk Factors:Diabetes, Hypertension and                 Dyslipidemia.  Sonographer:    Johny Chess RDCS Referring Phys: [id number] Eben Burow  Sonographer Comments: Patient is morbidly obese. Image acquisition challenging due to patient body habitus. IMPRESSIONS  1. Left ventricular ejection fraction, by estimation, is 60 to 65%. The left ventricle has normal function. The left ventricle has no regional wall motion abnormalities. Left ventricular diastolic parameters are consistent with Grade I diastolic dysfunction (impaired relaxation).  2. Right ventricular systolic function is normal. The right ventricular size is normal.  3. The mitral valve is normal in structure. No evidence of mitral valve regurgitation. No evidence of mitral stenosis.  4. The aortic valve is tricuspid. There is moderate calcification of the aortic valve. There is moderate thickening of the aortic valve. Aortic valve regurgitation is not visualized. Mild to moderate aortic valve stenosis. Aortic valve mean gradient measures 16.0 mmHg. Aortic valve Vmax measures 3.04 m/s.  5. The inferior vena cava is normal in size with  greater than 50% respiratory variability, suggesting right atrial pressure of 3 mmHg. FINDINGS  Left Ventricle: Left ventricular ejection fraction, by estimation, is 60 to 65%. The left ventricle has normal function. The left ventricle has no regional wall motion abnormalities. The left ventricular internal cavity size was normal in size. There is  no left ventricular hypertrophy. Left ventricular diastolic parameters are consistent with Grade I diastolic dysfunction (impaired relaxation). Indeterminate filling  pressures. Right Ventricle: The right ventricular size is normal. No increase in right ventricular wall thickness. Right ventricular systolic function is normal. Left Atrium: Left atrial size was normal in size. Right Atrium: Right atrial size was normal in size. Pericardium: There is no evidence of pericardial effusion. Mitral Valve: The mitral valve is normal in structure. No evidence of mitral valve regurgitation. No evidence of mitral valve stenosis. Tricuspid Valve: The tricuspid valve is normal in structure. Tricuspid valve regurgitation is not demonstrated. No evidence of tricuspid stenosis. Aortic Valve: The aortic valve is tricuspid. There is moderate calcification of the aortic valve. There is moderate thickening of the aortic valve. Aortic valve regurgitation is not visualized. Mild to moderate aortic stenosis is present. Aortic valve mean gradient measures 16.0 mmHg. Aortic valve peak gradient measures 37.0 mmHg. Aortic valve area, by VTI measures 1.77 cm. Pulmonic Valve: The pulmonic valve was normal in structure. Pulmonic valve regurgitation is not visualized. No evidence of pulmonic stenosis. Aorta: The aortic root is normal in size and structure. Venous: The inferior vena cava is normal in size with greater than 50% respiratory variability, suggesting right atrial pressure of 3 mmHg. IAS/Shunts: No atrial level shunt detected by color flow Doppler.  LEFT VENTRICLE PLAX 2D LVIDd:         5.60 cm      Diastology LVIDs:         4.10 cm      LV e' medial:    7.62 cm/s LV PW:         0.80 cm      LV E/e' medial:  13.1 LV IVS:        0.90 cm      LV e' lateral:   16.30 cm/s LVOT diam:     2.10 cm      LV E/e' lateral: 6.1 LV SV:         90 LV SV Index:   37 LVOT Area:     3.46 cm  LV Volumes (MOD) LV vol d, MOD A4C: 138.0 ml LV vol s, MOD A4C: 62.4 ml LV SV MOD A4C:     138.0 ml IVC IVC diam: 1.40 cm LEFT ATRIUM             Index        RIGHT ATRIUM           Index LA diam:        3.50 cm 1.42 cm/m   RA  Area:     17.90 cm LA Vol (A2C):   82.1 ml 33.20 ml/m  RA Volume:   45.00 ml  18.20 ml/m LA Vol (A4C):   66.2 ml 26.77 ml/m LA Biplane Vol: 77.7 ml 31.42 ml/m  AORTIC VALVE AV Area (Vmax):    1.64 cm AV Area (Vmean):   1.64 cm AV Area (VTI):     1.77 cm AV Vmax:           304.00 cm/s AV Vmean:          182.000 cm/s AV VTI:  0.512 m AV Peak Grad:      37.0 mmHg AV Mean Grad:      16.0 mmHg LVOT Vmax:         144.00 cm/s LVOT Vmean:        86.400 cm/s LVOT VTI:          0.261 m LVOT/AV VTI ratio: 0.51  AORTA Ao Root diam: 3.30 cm Ao Asc diam:  3.30 cm MITRAL VALVE MV Area (PHT): 3.60 cm     SHUNTS MV Decel Time: 211 msec     Systemic VTI:  0.26 m MV E velocity: 100.00 cm/s  Systemic Diam: 2.10 cm MV A velocity: 107.00 cm/s MV E/A ratio:  0.93 Mihai Croitoru MD Electronically signed by Sanda Klein MD Signature Date/Time: 10/23/2021/2:17:55 PM    Final    VAS US CAROTID (at [facility] and [facility] only)  Result Date: 10/23/2021 Carotid Arterial Duplex Study Patient Name:  NAHOM CARFAGNO  Date of Exam:   10/23/2021 Medical Rec #: [id number]         Accession #:    [id number] Date of Birth: 1946-10-12         Patient Gender: M Patient Age:   7 years Exam Location:  [facility] Procedure:      VAS US CAROTID Referring Phys: Harrold Donath --------------------------------------------------------------------------------  Indications:       Altered mental status. Risk Factors:      Hypertension, hyperlipidemia, Diabetes, coronary artery                    disease. Other Factors:     Patient presenting with hypoglycemia and hypothermia. Limitations        Today's exam was limited due to the body habitus of the                    patient and the patient's respiratory variation. Comparison Study:  Prior carotid duplex done 07/31/2019 Performing Technologist: Sharion Dove RVS  Examination Guidelines: A complete evaluation includes B-mode imaging, spectral Doppler, color Doppler, and power Doppler as  needed of all accessible portions of each vessel. Bilateral testing is considered an integral part of a complete examination. Limited examinations for reoccurring indications may be performed as noted.  Right Carotid Findings: +----------+--------+--------+--------+------------------+--------+             PSV cm/s EDV cm/s Stenosis Plaque Description Comments  +----------+--------+--------+--------+------------------+--------+  CCA Prox   81       21                                             +----------+--------+--------+--------+------------------+--------+  CCA Distal 118      27                                             +----------+--------+--------+--------+------------------+--------+  ICA Prox   120      25       1-39%    calcific                     +----------+--------+--------+--------+------------------+--------+  ICA Distal 95       24                                             +----------+--------+--------+--------+------------------+--------+  ECA        443      71       >50%     calcific                     +----------+--------+--------+--------+------------------+--------+ +----------+--------+-------+--------+-------------------+             PSV cm/s EDV cms Describe Arm Pressure (mmHG)  +----------+--------+-------+--------+-------------------+  Subclavian 166                                            +----------+--------+-------+--------+-------------------+ +---------+--------+--------+----------+  Vertebral PSV cm/s EDV cm/s Retrograde  +---------+--------+--------+----------+  Left Carotid Findings: +----------+--------+--------+--------+------------------+-------------------+             PSV cm/s EDV cm/s Stenosis Plaque Description Comments             +----------+--------+--------+--------+------------------+-------------------+  CCA Prox   103      22                                                        +----------+--------+--------+--------+------------------+-------------------+   CCA Distal 91       27                                                        +----------+--------+--------+--------+------------------+-------------------+  ICA Prox   149      48       1-39%    calcific                                +----------+--------+--------+--------+------------------+-------------------+  ICA Distal 98       22                                                        +----------+--------+--------+--------+------------------+-------------------+  ECA                                                      not well visualized  +----------+--------+--------+--------+------------------+-------------------+ +----------+--------+--------+--------+-------------------+             PSV cm/s EDV cm/s Describe Arm Pressure (mmHG)  +----------+--------+--------+--------+-------------------+  Subclavian 155                                             +----------+--------+--------+--------+-------------------+ +---------+--------+--+--------+--+  Vertebral PSV cm/s 80 EDV cm/s 18  +---------+--------+--+--------+--+   Summary: Right Carotid: Velocities in the right ICA are consistent with a 1-39% stenosis.                The ECA appears >  50% stenosed. Left Carotid: Velocities in the left ICA are consistent with a 1-39% stenosis. Vertebrals:  Left vertebral artery demonstrates antegrade flow. Right vertebral              artery demonstrates retrograde flow. Subclavians: Normal flow hemodynamics were seen in bilateral subclavian              arteries. *See table(s) above for measurements and observations.  Electronically signed by Orlie Pollen on 10/23/2021 at 12:05:10 PM.    Final     Scheduled Meds:  amLODipine  10 mg Oral Daily   aspirin EC  325 mg Oral q morning   atorvastatin  80 mg Oral q morning   influenza vaccine adjuvanted  0.5 mL Intramuscular Tomorrow-1000   sodium bicarbonate  650 mg Oral BID   tamsulosin  0.8 mg Oral Daily   Continuous Infusions:  sodium chloride 125 mL/hr at 10/25/21  0834     LOS: 2 days   Time spent: 28 minutes   Darliss Cheney, MD [facility]  10/25/2021, 9:21 AM  Please page via Shea Evans and do not message via secure chat for anything urgent. Secure chat can be used for anything non urgent.  How to contact the [facility] Attending or Consulting provider [place] or covering provider during after hours [place], for this patient?  Check the care team in [facility] and look for a) attending/consulting [facility] provider listed and b) the [facility] team listed. Page or secure chat [phone]. Log into [email or web address] and use [place]'s universal password to access. If you do not have the password, please contact the hospital operator. Locate the [facility] provider you are looking for under [facility] and page to a number that you can be directly reached. If you still have difficulty reaching the provider, please page the [facility] (Director on Call) for the Hospitalists listed on amion for assistance.

## 2021-10-26 DIAGNOSIS — N1831 Chronic kidney disease, stage 3a: Secondary | ICD-10-CM

## 2021-10-26 DIAGNOSIS — R4701 Aphasia: Secondary | ICD-10-CM

## 2021-10-26 DIAGNOSIS — N170 Acute kidney failure with tubular necrosis: Secondary | ICD-10-CM

## 2021-10-26 DIAGNOSIS — I6523 Occlusion and stenosis of bilateral carotid arteries: Secondary | ICD-10-CM

## 2021-10-26 DIAGNOSIS — G459 Transient cerebral ischemic attack, unspecified: Secondary | ICD-10-CM

## 2021-10-26 LAB — BASIC METABOLIC PANEL
Anion gap: 4 — ABNORMAL LOW (ref 5–15)
BUN: 20 mg/dL (ref 8–23)
CO2: 19 mmol/L — ABNORMAL LOW (ref 22–32)
Calcium: 9.1 mg/dL (ref 8.9–10.3)
Chloride: 113 mmol/L — ABNORMAL HIGH (ref 98–111)
Creatinine, Ser: 1.29 mg/dL — ABNORMAL HIGH (ref 0.61–1.24)
GFR, Estimated: 59 mL/min — ABNORMAL LOW (ref >60)
Glucose, Bld: 147 mg/dL — ABNORMAL HIGH (ref 70–99)
Potassium: 5.3 mmol/L — ABNORMAL HIGH (ref 3.5–5.1)
Sodium: 136 mmol/L (ref 135–145)

## 2021-10-26 MED ORDER — INSULIN REGULAR BOLUS VIA INFUSION: 10.0000 [IU] | Freq: Once | INTRAVENOUS | Status: DC

## 2021-10-26 MED ORDER — DEXTROSE 50 % IV SOLN: 1.0000 | Freq: Once | INTRAVENOUS | Status: DC

## 2021-10-26 MED ORDER — CLOPIDOGREL BISULFATE 75 MG PO TABS: 75.0000 mg | ORAL_TABLET | Freq: Every day | ORAL | 0 refills | Status: AC

## 2021-10-26 MED ORDER — SODIUM BICARBONATE 650 MG PO TABS: 650.0000 mg | ORAL_TABLET | Freq: Every day | ORAL | 0 refills | Status: AC

## 2021-10-26 MED ORDER — INSULIN ASPART 100 UNIT/ML IV SOLN: 10.0000 [IU] | Freq: Once | INTRAVENOUS | Status: DC

## 2021-10-26 MED ORDER — ASPIRIN 81 MG PO TBEC
81.0000 mg | DELAYED_RELEASE_TABLET | Freq: Every day | ORAL | 2 refills | Status: AC
Start: 2021-10-26 — End: 2022-01-24

## 2021-10-26 MED ORDER — COVID-19MRNA BIVAL VACC PFIZER 30 MCG/0.3ML IM SUSP: 0.3000 mL | Freq: Once | INTRAMUSCULAR | Status: DC

## 2021-10-26 NOTE — Plan of Care (Signed)
Patient discharged in care of family. All goals met.

## 2021-10-26 NOTE — Procedures (Signed)
Patient Name: HILLERY BHALLA  MRN: [id number]  Epilepsy Attending: Lora Havens  Referring Physician/Provider: Dr Antony Contras Date: 10/25/2021 Duration: 22.13 mins  Patient history: 76 year old male with aphasia/transient speech disturbance.  EEG to evaluate for seizure.  Level of alertness: Awake, asleep  AEDs during EEG study: None  Technical aspects: This EEG study was done with scalp electrodes positioned according to the 10-20 International system of electrode placement. Electrical activity was acquired at a sampling rate of 500Hz  and reviewed with a high frequency filter of 70Hz  and a low frequency filter of 1Hz . EEG data were recorded continuously and digitally stored.   Description: The posterior dominant rhythm consists of 9-10 Hz activity of moderate voltage (25-35 uV) seen predominantly in posterior head regions, symmetric and reactive to eye opening and eye closing. Sleep was characterized by vertex waves, sleep spindles (12 to 14 Hz), maximal frontocentral region.  Hyperventilation and photic stimulation were not performed.     IMPRESSION: This study is within normal limits. No seizures or epileptiform discharges were seen throughout the recording.  Paiton B[place]tinghouse Barbra Sarks

## 2021-10-26 NOTE — Progress Notes (Signed)
Nursing DC note  Patient alert oriented. Piv s dcd. Site unremarkable. Ccmd also notified of patients discharge. Patient will be transferred to dc lounge while waiting for equipment.

## 2021-10-26 NOTE — Discharge Summary (Signed)
Physician Discharge Summary  [name] HUT:[id number] DOB: [birth year] DOA: 10/23/2021  PCP: [name], MD  Admit date: 10/23/2021 Discharge date: 10/26/2021 30 Day Unplanned Readmission Risk Score    Flowsheet Row ED to Hosp-Admission (Current) from 10/23/2021 in [facility] [address]  30 Day Unplanned Readmission Risk Score (%) 13.6 Filed at 10/26/2021 0801       This score is the patient's risk of an unplanned readmission within 30 days of being discharged (0 -100%). The score is based on dignosis, age, lab data, medications, orders, and past utilization.   Low:  0-14.9   Medium: 15-21.9   High: 22-29.9   Extreme: 30 and above          Admitted From: Home Disposition: Home  Recommendations for Outpatient Follow-up:  Follow up with PCP in 1-2 weeks Please obtain BMP/CBC in one week Follow-up with your primary nephrologist in 1 to 2 weeks Please stop taking aspirin 325 mg and start taking aspirin 81 mg along with Plavix 75 mg p.o. daily and take those for 3 months and then stop taking aspirin but continue taking Plavix. Please follow up with your PCP on the following pending results: Unresulted Labs (From admission, onward)     Start     Ordered   10/26/21 1200  Potassium  Once,   R        10/26/21 0806              Home Health: None Equipment/Devices: None  Discharge Condition: Stable CODE STATUS: Full code Diet recommendation: Cardiac  Subjective: Seen and examined.  No complaints.  Brief/Interim Summary: [name] is a 75 y.o. male with medical history significant for DMT2, CKD 3a, HTN, CAD, HLD, gout was brought into the emergency department with inability to speak.  EMS was called . reportedly EMS found his blood sugar to be 62 and he was given something to drink and blood sugar improved.  In the emergency room patient was able to speak was found to be mildly hypothermic with a temperature of 1F. He was placed on a Bair hugger and  given some IV fluids. He was given a half amp of D50 in the emergency room his blood sugars remained in the 80-90 range.  He also had some hyponatremia and hyperkalemia as well as AKI on CKD stage IIIa.  He was admitted under hospitalist service.  Neurology consulted.   AKI on CKD stage IIIa: His creatinine is now back to his baseline after receiving IV fluids.   Aphasia/strokelike symptoms/TIA: Underwent thorough stroke work-up with CT head, MRI head and MRA negative however his carotid ultrasound shows more than 50% stenosed ECA and anterograde flow in left vertebral but retrograde flow in right vertebral.  Discussed with Dr. Donzetta Matters of vascular surgery who said that all these findings are clinically insignificant.  However MRA brain showed Anterior circulation: Extensive atheromatous irregularity and high-grade narrowing of the bilateral carotid siphons, stenosis greatest on the right at the posterior genu and left at the anterior genu. Irregular appearance of the ICA at the skull base which is usually from bone interface artifact. Intracranial branch irregularities attributed atherosclerosis, mild when compared to the ICA disease. Negative for aneurysm.   Posterior circulation: Left dominant vertebral artery. Atheromatous irregularity and narrowing of the right V4 segment. No branch occlusion, beading, or aneurysm. Discussed case with neurology Dr. Leonie Man who recommends only medical management and starting patient on DAPT for 3 months and then Plavix alone  since patient is already taking aspirin. Since his stenosis is asymptomatic.   DM (diabetes mellitus) type II: Patient initially presented with hypoglycemia, blood sugar now controlled.  Hemoglobin A1c only 5.8.  He is on glipizide at home.  Hemoglobin A1c indicates that his symptoms are likely due to hypoglycemia.  Discontinuing glipizide.  Advised diet control.   Hyponatremia: Resolved.   Essential hypertension: Controlled while he is only  on amlodipine however at home he is also on chlorthalidone and lisinopril.  Resuming lisinopril and amlodipine but holding chlorthalidone.   Hyperkalemia: Resolved.  Patient received dextrose and IV insulin this morning.   Metabolic acidosis: CO2 remained stable at 15.  Case discussed with Dr. Justin Mend of nephrology who recommended starting patient on bicarb p.o. tablets and with this, his CO2 improved to 19.  Per Dr. Justin Mend recommendation, I am discharging him on once daily bicarb and I have advised him to follow-up with his nephrologist in 1 to 2 weeks.   Hypothermia: Resolved with bear hugger.    Discharge Diagnoses:  Principal Problem:   Acute renal failure superimposed on stage 3a chronic kidney disease ([place]) Active Problems:   Essential hypertension   DM (diabetes mellitus) type II controlled with renal manifestation (HCC)   Aphasia   Hyponatremia   Hyperkalemia   TIA (transient ischemic attack)   Metabolic acidosis   Intracranial carotid stenosis, bilateral    Discharge Instructions   Allergies as of 10/26/2021   No Known Allergies      Medication List     STOP taking these medications    chlorthalidone 25 MG tablet Commonly known as: HYGROTON   glipiZIDE 5 MG 24 hr tablet Commonly known as: GLUCOTROL XL       TAKE these medications    allopurinol 100 MG tablet Commonly known as: ZYLOPRIM TAKE 1 TABLET BY MOUTH  DAILY   amLODipine 10 MG tablet Commonly known as: NORVASC TAKE 1 TABLET BY MOUTH  DAILY   aspirin 81 MG EC tablet Take 1 tablet (81 mg total) by mouth daily. Swallow whole. What changed:  medication strength how much to take when to take this additional instructions   atorvastatin 80 MG tablet Commonly known as: LIPITOR TAKE 1 TABLET BY MOUTH EVERY MORNING. What changed: when to take this   clopidogrel 75 MG tablet Commonly known as: PLAVIX Take 1 tablet (75 mg total) by mouth daily.   colchicine 0.6 MG tablet Take 2 tabs (1.2mg ) at  fist sign of gout flare.  Then take 1 tab 1 hour later.  On day 2 take one tab daily until flare stops.   FreeStyle Libre 14 Day Reader Kerrin Mo Use as directed   [employer] Apply device as directed every 2 weeks.   gabapentin 100 MG capsule Commonly known as: NEURONTIN TAKE 1 CAPSULE BY MOUTH EVERYDAY AT BEDTIME What changed: See the new instructions.   glucose blood test strip Commonly known as: ONE TOUCH ULTRA TEST 1 each by Other route daily. Use as instructed   lisinopril 40 MG tablet Commonly known as: ZESTRIL Take 1 tablet (40 mg total) by mouth daily.   Lokelma 10 g Pack packet Generic drug: sodium zirconium cyclosilicate Take 1 packet by mouth daily.   nitroGLYCERIN 0.4 MG SL tablet Commonly known as: Nitrostat Place 1 tablet (0.4 mg total) under the tongue every 5 (five) minutes as needed for chest pain.   onetouch ultrasoft lancets 1 each by Other route daily. Use as instructed  sildenafil 25 MG tablet Commonly known as: VIAGRA Take 1 tablet (25 mg total) by mouth daily as needed for erectile dysfunction.   sodium bicarbonate 650 MG tablet Take 1 tablet (650 mg total) by mouth daily.   tamsulosin 0.4 MG Caps capsule Commonly known as: FLOMAX TAKE 2 CAPSULES BY MOUTH EVERY DAY What changed: how to take this   traMADol 50 MG tablet Commonly known as: ULTRAM TAKE 2 TABLETS (100 MG TOTAL) BY MOUTH EVERY 12 (TWELVE) HOURS AS NEEDED. What changed: reasons to take this        Follow-up Information     [name], MD Follow up in 1 week(s).   Specialty: Family Medicine Contact information: [place] [address] [phone]                No Known Allergies  Consultations: Neurology   Procedures/Studies: CT Head Wo Contrast  Result Date: 10/23/2021 CLINICAL DATA:  Altered mental status, aphasia EXAM: CT HEAD WITHOUT CONTRAST TECHNIQUE: Contiguous axial images were obtained from the base  of the skull through the vertex without intravenous contrast. COMPARISON:  None. FINDINGS: Brain: Normal anatomic configuration. Parenchymal volume loss is commensurate with the patient's age. Mild periventricular white matter changes are present likely reflecting the sequela of small vessel ischemia. No abnormal intra or extra-axial mass lesion or fluid collection. No abnormal mass effect or midline shift. No evidence of acute intracranial hemorrhage or infarct. Ventricular size is normal. Cerebellum unremarkable. Vascular: No asymmetric hyperdense vasculature at the skull base. Moderate atherosclerotic calcification is seen within the carotid siphons and distal vertebral arteries. Skull: Intact Sinuses/Orbits: Paranasal sinuses are clear. Orbits are unremarkable. Other: Multiple fluid-filled inferior right mastoid air cells are identified without associated osseous erosion. Left mastoid air cells and middle ear cavities are clear. IMPRESSION: No acute intracranial abnormality. Electronically Signed   By: Fidela Salisbury M.D.   On: 10/23/2021 03:14   MR ANGIO HEAD WO CONTRAST  Result Date: 10/23/2021 CLINICAL DATA:  Neuro deficit with acute stroke suspected. Slurred speech which has improved EXAM: MRI HEAD WITHOUT CONTRAST MRA HEAD WITHOUT CONTRAST TECHNIQUE: Multiplanar, multi-echo pulse sequences of the brain and surrounding structures were acquired without intravenous contrast. Angiographic images of the Circle of Willis were acquired using MRA technique without intravenous contrast. COMPARISON:  No pertinent prior exam. FINDINGS: MRI HEAD FINDINGS Brain: No acute infarction, hemorrhage, hydrocephalus, extra-axial collection or mass lesion. Chronic small vessel ischemia in the deep white matter and in the pons. Chronic lacunar infarct in the right thalamus best seen on T2 weighted imaging. Age congruent brain volume Vascular: Arterial findings described below. Skull and upper cervical spine: Negative for  marrow lesion. Some fluid is seen around the right atlantoaxial and C1-2 joints. Sinuses/Orbits: Partial right mastoid opacification with negative nasopharynx. MRA HEAD FINDINGS Anterior circulation: Extensive atheromatous irregularity and high-grade narrowing of the bilateral carotid siphons, stenosis greatest on the right at the posterior genu and left at the anterior genu. Irregular appearance of the ICA at the skull base which is usually from bone interface artifact. Intracranial branch irregularities attributed atherosclerosis, mild when compared to the ICA disease. Negative for aneurysm Posterior circulation: Left dominant vertebral artery. Atheromatous irregularity and narrowing of the right V4 segment. No branch occlusion, beading, or aneurysm. Anatomic variants: None significant IMPRESSION: Brain MRI: 1. No acute finding such as infarct. 2. Chronic small vessel ischemia. Intracranial MRA: 1. No emergent finding. 2. Advanced atheromatous irregularity of the carotid siphons with high-grade narrowing. Electronically  Signed   By: Jorje Guild M.D.   On: 10/23/2021 06:41   MR BRAIN WO CONTRAST  Result Date: 10/23/2021 CLINICAL DATA:  Neuro deficit with acute stroke suspected. Slurred speech which has improved EXAM: MRI HEAD WITHOUT CONTRAST MRA HEAD WITHOUT CONTRAST TECHNIQUE: Multiplanar, multi-echo pulse sequences of the brain and surrounding structures were acquired without intravenous contrast. Angiographic images of the Circle of Willis were acquired using MRA technique without intravenous contrast. COMPARISON:  No pertinent prior exam. FINDINGS: MRI HEAD FINDINGS Brain: No acute infarction, hemorrhage, hydrocephalus, extra-axial collection or mass lesion. Chronic small vessel ischemia in the deep white matter and in the pons. Chronic lacunar infarct in the right thalamus best seen on T2 weighted imaging. Age congruent brain volume Vascular: Arterial findings described below. Skull and upper  cervical spine: Negative for marrow lesion. Some fluid is seen around the right atlantoaxial and C1-2 joints. Sinuses/Orbits: Partial right mastoid opacification with negative nasopharynx. MRA HEAD FINDINGS Anterior circulation: Extensive atheromatous irregularity and high-grade narrowing of the bilateral carotid siphons, stenosis greatest on the right at the posterior genu and left at the anterior genu. Irregular appearance of the ICA at the skull base which is usually from bone interface artifact. Intracranial branch irregularities attributed atherosclerosis, mild when compared to the ICA disease. Negative for aneurysm Posterior circulation: Left dominant vertebral artery. Atheromatous irregularity and narrowing of the right V4 segment. No branch occlusion, beading, or aneurysm. Anatomic variants: None significant IMPRESSION: Brain MRI: 1. No acute finding such as infarct. 2. Chronic small vessel ischemia. Intracranial MRA: 1. No emergent finding. 2. Advanced atheromatous irregularity of the carotid siphons with high-grade narrowing. Electronically Signed   By: Jorje Guild M.D.   On: 10/23/2021 06:41   US RENAL  Result Date: 10/23/2021 CLINICAL DATA:  75 year old male with history of acute renal failure. EXAM: RENAL / URINARY TRACT ULTRASOUND COMPLETE COMPARISON:  Renal ultrasound 07/08/2021. FINDINGS: Right Kidney: Renal measurements: 9.5 x 4.6 x 5.2 cm = volume: 119.9 mL. Echogenicity within normal limits. No mass or hydronephrosis visualized. Left Kidney: Renal measurements: 11.0 x 6.4 x 6.6 cm = volume: 242.6 mL. Echogenicity within normal limits. No mass or hydronephrosis visualized. Bladder: Appears normal for degree of bladder distention. Other: None. IMPRESSION: 1. Normal sonographic appearance of the kidneys bilaterally. No hydronephrosis or other acute findings. Electronically Signed   By: Vinnie Langton M.D.   On: 10/23/2021 05:17   DG Chest Port 1 View  Result Date: 10/23/2021 CLINICAL  DATA:  Altered mental status EXAM: PORTABLE CHEST 1 VIEW COMPARISON:  08/21/2022 FINDINGS: Lungs are well expanded, symmetric, and clear. No pneumothorax or pleural effusion. Coronary artery bypass grafting has been performed. Cardiac size is likely within normal limits when accounting for semi-erect positioning. Pulmonary vascularity is normal. Osseous structures are age-appropriate. No acute bone abnormality. IMPRESSION: No active disease. Electronically Signed   By: Fidela Salisbury M.D.   On: 10/23/2021 03:12   EEG adult  Result Date: 10/26/2021 Lora Havens, MD     10/26/2021  8:11 AM Patient Name: KENLY XIAO MRN: [id number] Epilepsy Attending: Lora Havens Referring Physician/Provider: Dr Antony Contras Date: 10/25/2021 Duration: 22.13 mins Patient history: 75 year old male with aphasia/transient speech disturbance.  EEG to evaluate for seizure. Level of alertness: Awake, asleep AEDs during EEG study: None Technical aspects: This EEG study was done with scalp electrodes positioned according to the 10-20 International system of electrode placement. Electrical activity was acquired at a sampling rate of 500Hz  and reviewed with a  high frequency filter of 70Hz  and a low frequency filter of 1Hz . EEG data were recorded continuously and digitally stored. Description: The posterior dominant rhythm consists of 9-10 Hz activity of moderate voltage (25-35 uV) seen predominantly in posterior head regions, symmetric and reactive to eye opening and eye closing. Sleep was characterized by vertex waves, sleep spindles (12 to 14 Hz), maximal frontocentral region.  Hyperventilation and photic stimulation were not performed.   IMPRESSION: This study is within normal limits. No seizures or epileptiform discharges were seen throughout the recording. Lora Havens   ECHOCARDIOGRAM COMPLETE  Result Date: 10/23/2021    ECHOCARDIOGRAM REPORT   Patient Name:   JAMAUL HEIST Date of Exam: 10/23/2021 Medical Rec  #:  [id number]        Height:       70.0 in Accession #:    [id number]       Weight:       298.0 lb Date of Birth:  April 26, 1946        BSA:          2.473 m Patient Age:    23 years         BP:           112/60 mmHg Patient Gender: M                HR:           75 bpm. Exam Location:  Inpatient Procedure: 2D Echo Indications:    TIA  History:        Patient has no prior history of Echocardiogram examinations.                 CAD, Prior CABG; Risk Factors:Diabetes, Hypertension and                 Dyslipidemia.  Sonographer:    Johny Chess RDCS Referring Phys: [id number] Eben Burow  Sonographer Comments: Patient is morbidly obese. Image acquisition challenging due to patient body habitus. IMPRESSIONS  1. Left ventricular ejection fraction, by estimation, is 60 to 65%. The left ventricle has normal function. The left ventricle has no regional wall motion abnormalities. Left ventricular diastolic parameters are consistent with Grade I diastolic dysfunction (impaired relaxation).  2. Right ventricular systolic function is normal. The right ventricular size is normal.  3. The mitral valve is normal in structure. No evidence of mitral valve regurgitation. No evidence of mitral stenosis.  4. The aortic valve is tricuspid. There is moderate calcification of the aortic valve. There is moderate thickening of the aortic valve. Aortic valve regurgitation is not visualized. Mild to moderate aortic valve stenosis. Aortic valve mean gradient measures 16.0 mmHg. Aortic valve Vmax measures 3.04 m/s.  5. The inferior vena cava is normal in size with greater than 50% respiratory variability, suggesting right atrial pressure of 3 mmHg. FINDINGS  Left Ventricle: Left ventricular ejection fraction, by estimation, is 60 to 65%. The left ventricle has normal function. The left ventricle has no regional wall motion abnormalities. The left ventricular internal cavity size was normal in size. There is  no left ventricular  hypertrophy. Left ventricular diastolic parameters are consistent with Grade I diastolic dysfunction (impaired relaxation). Indeterminate filling pressures. Right Ventricle: The right ventricular size is normal. No increase in right ventricular wall thickness. Right ventricular systolic function is normal. Left Atrium: Left atrial size was normal in size. Right Atrium: Right atrial size was normal in size. Pericardium: There is no evidence of pericardial  effusion. Mitral Valve: The mitral valve is normal in structure. No evidence of mitral valve regurgitation. No evidence of mitral valve stenosis. Tricuspid Valve: The tricuspid valve is normal in structure. Tricuspid valve regurgitation is not demonstrated. No evidence of tricuspid stenosis. Aortic Valve: The aortic valve is tricuspid. There is moderate calcification of the aortic valve. There is moderate thickening of the aortic valve. Aortic valve regurgitation is not visualized. Mild to moderate aortic stenosis is present. Aortic valve mean gradient measures 16.0 mmHg. Aortic valve peak gradient measures 37.0 mmHg. Aortic valve area, by VTI measures 1.77 cm. Pulmonic Valve: The pulmonic valve was normal in structure. Pulmonic valve regurgitation is not visualized. No evidence of pulmonic stenosis. Aorta: The aortic root is normal in size and structure. Venous: The inferior vena cava is normal in size with greater than 50% respiratory variability, suggesting right atrial pressure of 3 mmHg. IAS/Shunts: No atrial level shunt detected by color flow Doppler.  LEFT VENTRICLE PLAX 2D LVIDd:         5.60 cm      Diastology LVIDs:         4.10 cm      LV e' medial:    7.62 cm/s LV PW:         0.80 cm      LV E/e' medial:  13.1 LV IVS:        0.90 cm      LV e' lateral:   16.30 cm/s LVOT diam:     2.10 cm      LV E/e' lateral: 6.1 LV SV:         90 LV SV Index:   37 LVOT Area:     3.46 cm  LV Volumes (MOD) LV vol d, MOD A4C: 138.0 ml LV vol s, MOD A4C: 62.4 ml LV SV MOD  A4C:     138.0 ml IVC IVC diam: 1.40 cm LEFT ATRIUM             Index        RIGHT ATRIUM           Index LA diam:        3.50 cm 1.42 cm/m   RA Area:     17.90 cm LA Vol (A2C):   82.1 ml 33.20 ml/m  RA Volume:   45.00 ml  18.20 ml/m LA Vol (A4C):   66.2 ml 26.77 ml/m LA Biplane Vol: 77.7 ml 31.42 ml/m  AORTIC VALVE AV Area (Vmax):    1.64 cm AV Area (Vmean):   1.64 cm AV Area (VTI):     1.77 cm AV Vmax:           304.00 cm/s AV Vmean:          182.000 cm/s AV VTI:            0.512 m AV Peak Grad:      37.0 mmHg AV Mean Grad:      16.0 mmHg LVOT Vmax:         144.00 cm/s LVOT Vmean:        86.400 cm/s LVOT VTI:          0.261 m LVOT/AV VTI ratio: 0.51  AORTA Ao Root diam: 3.30 cm Ao Asc diam:  3.30 cm MITRAL VALVE MV Area (PHT): 3.60 cm     SHUNTS MV Decel Time: 211 msec     Systemic VTI:  0.26 m MV E velocity: 100.00 cm/s  Systemic Diam: 2.10 cm  MV A velocity: 107.00 cm/s MV E/A ratio:  0.93 Mihai Croitoru MD Electronically signed by Sanda Klein MD Signature Date/Time: 10/23/2021/2:17:55 PM    Final    VAS US CAROTID (at [facility] and [facility] only)  Result Date: 10/23/2021 Carotid Arterial Duplex Study Patient Name:  MAREON ROBINETTE  Date of Exam:   10/23/2021 Medical Rec #: [id number]         Accession #:    [id number] Date of Birth: August 20, 1946         Patient Gender: M Patient Age:   37 years Exam Location:  [facility] Procedure:      VAS US CAROTID Referring Phys: Harrold Donath --------------------------------------------------------------------------------  Indications:       Altered mental status. Risk Factors:      Hypertension, hyperlipidemia, Diabetes, coronary artery                    disease. Other Factors:     Patient presenting with hypoglycemia and hypothermia. Limitations        Today's exam was limited due to the body habitus of the                    patient and the patient's respiratory variation. Comparison Study:  Prior carotid duplex done 07/31/2019 Performing Technologist:  Sharion Dove RVS  Examination Guidelines: A complete evaluation includes B-mode imaging, spectral Doppler, color Doppler, and power Doppler as needed of all accessible portions of each vessel. Bilateral testing is considered an integral part of a complete examination. Limited examinations for reoccurring indications may be performed as noted.  Right Carotid Findings: +----------+--------+--------+--------+------------------+--------+             PSV cm/s EDV cm/s Stenosis Plaque Description Comments  +----------+--------+--------+--------+------------------+--------+  CCA Prox   81       21                                             +----------+--------+--------+--------+------------------+--------+  CCA Distal 118      27                                             +----------+--------+--------+--------+------------------+--------+  ICA Prox   120      25       1-39%    calcific                     +----------+--------+--------+--------+------------------+--------+  ICA Distal 95       24                                             +----------+--------+--------+--------+------------------+--------+  ECA        443      71       >50%     calcific                     +----------+--------+--------+--------+------------------+--------+ +----------+--------+-------+--------+-------------------+             PSV cm/s EDV cms Describe Arm Pressure (mmHG)  +----------+--------+-------+--------+-------------------+  Subclavian 166                                            +----------+--------+-------+--------+-------------------+ +---------+--------+--------+----------+  Vertebral PSV cm/s EDV cm/s Retrograde  +---------+--------+--------+----------+  Left Carotid Findings: +----------+--------+--------+--------+------------------+-------------------+             PSV cm/s EDV cm/s Stenosis Plaque Description Comments             +----------+--------+--------+--------+------------------+-------------------+  CCA Prox    103      22                                                        +----------+--------+--------+--------+------------------+-------------------+  CCA Distal 91       27                                                        +----------+--------+--------+--------+------------------+-------------------+  ICA Prox   149      48       1-39%    calcific                                +----------+--------+--------+--------+------------------+-------------------+  ICA Distal 98       22                                                        +----------+--------+--------+--------+------------------+-------------------+  ECA                                                      not well visualized  +----------+--------+--------+--------+------------------+-------------------+ +----------+--------+--------+--------+-------------------+             PSV cm/s EDV cm/s Describe Arm Pressure (mmHG)  +----------+--------+--------+--------+-------------------+  Subclavian 155                                             +----------+--------+--------+--------+-------------------+ +---------+--------+--+--------+--+  Vertebral PSV cm/s 80 EDV cm/s 18  +---------+--------+--+--------+--+   Summary: Right Carotid: Velocities in the right ICA are consistent with a 1-39% stenosis.                The ECA appears >50% stenosed. Left Carotid: Velocities in the left ICA are consistent with a 1-39% stenosis. Vertebrals:  Left vertebral artery demonstrates antegrade flow. Right vertebral              artery demonstrates retrograde flow. Subclavians: Normal flow hemodynamics were seen in bilateral subclavian              arteries. *See table(s) above for measurements and observations.  Electronically signed by Orlie Pollen on 10/23/2021 at 12:05:10 PM.    Final      Discharge Exam: Vitals:   10/26/21 0507 10/26/21 0940  BP: (!) 145/72 (!) 146/76  Pulse: 72 76  Resp: 18 17  Temp: 98.1 F (36.7  C) 97.9 F (36.6 C)  SpO2: 95% 99%    Vitals:   10/25/21 1704 10/25/21 2113 10/26/21 0507 10/26/21 0940  BP: (!) 164/79 (!) 168/85 (!) 145/72 (!) 146/76  Pulse: 73 81 72 76  Resp: 18 18 18 17   Temp: 98.5 F (36.9 C) 98 F (36.7 C) 98.1 F (36.7 C) 97.9 F (36.6 C)  TempSrc: Oral Oral Oral Oral  SpO2: 99% 98% 95% 99%  Weight:      Height:        General: Pt is alert, awake, not in acute distress Cardiovascular: RRR, S1/S2 +, no rubs, no gallops Respiratory: CTA bilaterally, no wheezing, no rhonchi Abdominal: Soft, NT, ND, bowel sounds + Extremities: no edema, no cyanosis    The results of significant diagnostics from this hospitalization (including imaging, microbiology, ancillary and laboratory) are listed below for reference.     Microbiology: Recent Results (from the past 240 hour(s))  Culture, blood (routine x 2)     Status: None (Preliminary result)   Collection Time: 10/23/21  2:56 AM   Specimen: BLOOD  Result Value Ref Range Status   Specimen Description BLOOD  Final   Special Requests   Final    BOTTLES DRAWN AEROBIC AND ANAEROBIC Blood Culture results may not be optimal due to an inadequate volume of blood received in culture bottles   Culture   Final    NO GROWTH 2 DAYS Performed at [facility], [place] [address] [place], [place] [address]    Report Status PENDING  Incomplete  Culture, blood (routine x 2)     Status: None (Preliminary result)   Collection Time: 10/23/21  3:01 AM   Specimen: BLOOD  Result Value Ref Range Status   Specimen Description BLOOD LEFT ANTECUBITAL  Final   Special Requests   Final    BOTTLES DRAWN AEROBIC AND ANAEROBIC Blood Culture adequate volume   Culture   Final    NO GROWTH 2 DAYS Performed at [facility], [place] [address], [place], [place] [address]    Report Status PENDING  Incomplete  Resp Panel by RT-PCR (Flu A&B, Covid) Nasopharyngeal Swab     Status: None   Collection Time: 10/23/21  4:32 AM   Specimen: Nasopharyngeal Swab;  Nasopharyngeal(NP) swabs in vial transport medium  Result Value Ref Range Status   SARS Coronavirus 2 by RT PCR NEGATIVE NEGATIVE Final    Comment: (NOTE) SARS-CoV-2 target nucleic acids are NOT DETECTED.  The SARS-CoV-2 RNA is generally detectable in upper respiratory specimens during the acute phase of infection. The lowest concentration of SARS-CoV-2 viral copies this assay can detect is 138 copies/mL. A negative result does not preclude SARS-Cov-2 infection and should not be used as the sole basis for treatment or other patient management decisions. A negative result may occur with  improper specimen collection/handling, submission of specimen other than nasopharyngeal swab, presence of viral mutation(s) within the areas targeted by this assay, and inadequate number of viral copies(<138 copies/mL). A negative result must be combined with clinical observations, patient history, and epidemiological information. The expected result is Negative.  Fact Sheet for Patients:  EntrepreneurPulse.com.au  Fact Sheet for Healthcare Providers:  IncredibleEmployment.be  This test is no t yet approved or cleared by the [place] FDA and  has been authorized for detection and/or diagnosis of SARS-CoV-2 by FDA under an Emergency Use Authorization (EUA). This EUA will remain  in effect (meaning this test can be used) for the duration of  the COVID-19 declaration under Section 564(b)(1) of the Act, 21 U.S.C.section 360bbb-3(b)(1), unless the authorization is terminated  or revoked sooner.       Influenza A by PCR NEGATIVE NEGATIVE Final   Influenza B by PCR NEGATIVE NEGATIVE Final    Comment: (NOTE) The Xpert Xpress SARS-CoV-2/FLU/RSV plus assay is intended as an aid in the diagnosis of influenza from Nasopharyngeal swab specimens and should not be used as a sole basis for treatment. Nasal washings and aspirates are unacceptable for Xpert Xpress  SARS-CoV-2/FLU/RSV testing.  Fact Sheet for Patients: EntrepreneurPulse.com.au  Fact Sheet for Healthcare Providers: IncredibleEmployment.be  This test is not yet approved or cleared by the [place] FDA and has been authorized for detection and/or diagnosis of SARS-CoV-2 by FDA under an Emergency Use Authorization (EUA). This EUA will remain in effect (meaning this test can be used) for the duration of the COVID-19 declaration under Section 564(b)(1) of the Act, 21 U.S.C. section 360bbb-3(b)(1), unless the authorization is terminated or revoked.  Performed at [facility], [place] [address] [place], [place] [address]      Labs: BNP (last 3 results) No results for input(s): BNP in the last 8760 hours. Basic Metabolic Panel: Recent Labs  Lab 10/23/21 0239 10/23/21 0250 10/23/21 0400 10/23/21 1842 10/24/21 0315 10/25/21 0338 10/25/21 1400 10/26/21 0447  NA 127* 128* 129* 133* 134* 135  --  136  K 5.4* 6.4* 5.4* 5.8* 5.3* 5.4* 5.4* 5.3*  CL 107 109  --  114* 115* 115*  --  113*  CO2 12*  --   --  15* 15* 15*  --  19*  GLUCOSE 83 80  --  166* 169* 140*  --  147*  BUN 64* 78*  --  56* 48* 28*  --  20  CREATININE 2.54* 2.80*  --  2.01* 1.82* 1.30*  --  1.29*  CALCIUM 8.2*  --   --  8.6* 8.4* 8.7*  --  9.1  MG  --   --   --   --   --  1.8  --   --    Liver Function Tests: Recent Labs  Lab 10/23/21 0239 10/25/21 0338  AST 28 26  ALT 17 20  ALKPHOS 97 76  BILITOT 0.6 0.8  PROT 6.3* 6.2*  ALBUMIN 2.8* 2.6*   No results for input(s): LIPASE, AMYLASE in the last 168 hours. No results for input(s): AMMONIA in the last 168 hours. CBC: Recent Labs  Lab 10/23/21 0239 10/23/21 0250 10/23/21 0400 10/24/21 0315  WBC 6.8  --   --  5.0  NEUTROABS 5.9  --   --   --   HGB 10.2* 11.6* 10.2* 9.5*  HCT 33.6* 34.0* 30.0* 30.0*  MCV 102.8*  --   --  97.7  PLT 178  --   --  187   Cardiac Enzymes: No results for input(s):  CKTOTAL, CKMB, CKMBINDEX, TROPONINI in the last 168 hours. BNP: Invalid input(s): POCBNP CBG: Recent Labs  Lab 10/23/21 0251 10/23/21 0407 10/23/21 0625 10/23/21 0756 10/23/21 1241  GLUCAP 72 91 102* 115* 154*   D-Dimer No results for input(s): DDIMER in the last 72 hours. Hgb A1c Recent Labs    10/24/21 0315  HGBA1C 5.8*   Lipid Profile Recent Labs    10/24/21 0315  CHOL 115  HDL 63  LDLCALC 44  TRIG 42  CHOLHDL 1.8   Thyroid function studies No results for input(s): TSH, T4TOTAL, T3FREE, THYROIDAB in the  last 72 hours.  Invalid input(s): FREET3 Anemia work up No results for input(s): VITAMINB12, FOLATE, FERRITIN, TIBC, IRON, RETICCTPCT in the last 72 hours. Urinalysis    Component Value Date/Time   COLORURINE YELLOW 10/23/2021 [place] 10/23/2021 0415   LABSPEC 1.010 10/23/2021 0415   PHURINE 5.5 10/23/2021 0415   GLUCOSEU NEGATIVE 10/23/2021 0415   HGBUR TRACE (A) 10/23/2021 0415   HGBUR negative 08/05/2010 0837   BILIRUBINUR NEGATIVE 10/23/2021 0415   BILIRUBINUR 1+ 09/06/2020 1431   KETONESUR NEGATIVE 10/23/2021 0415   PROTEINUR NEGATIVE 10/23/2021 0415   UROBILINOGEN 0.2 09/06/2020 1431   UROBILINOGEN 1.0 08/25/2012 1744   NITRITE NEGATIVE 10/23/2021 0415   LEUKOCYTESUR NEGATIVE 10/23/2021 0415   Sepsis Labs Invalid input(s): PROCALCITONIN,  WBC,  LACTICIDVEN Microbiology Recent Results (from the past 240 hour(s))  Culture, blood (routine x 2)     Status: None (Preliminary result)   Collection Time: 10/23/21  2:56 AM   Specimen: BLOOD  Result Value Ref Range Status   Specimen Description BLOOD  Final   Special Requests   Final    BOTTLES DRAWN AEROBIC AND ANAEROBIC Blood Culture results may not be optimal due to an inadequate volume of blood received in culture bottles   Culture   Final    NO GROWTH 2 DAYS Performed at [facility], [place] [address], [place], [place] [address]    Report Status PENDING  Incomplete   Culture, blood (routine x 2)     Status: None (Preliminary result)   Collection Time: 10/23/21  3:01 AM   Specimen: BLOOD  Result Value Ref Range Status   Specimen Description BLOOD LEFT ANTECUBITAL  Final   Special Requests   Final    BOTTLES DRAWN AEROBIC AND ANAEROBIC Blood Culture adequate volume   Culture   Final    NO GROWTH 2 DAYS Performed at [facility], [place] [address], [place], [place] [address]    Report Status PENDING  Incomplete  Resp Panel by RT-PCR (Flu A&B, Covid) Nasopharyngeal Swab     Status: None   Collection Time: 10/23/21  4:32 AM   Specimen: Nasopharyngeal Swab; Nasopharyngeal(NP) swabs in vial transport medium  Result Value Ref Range Status   SARS Coronavirus 2 by RT PCR NEGATIVE NEGATIVE Final    Comment: (NOTE) SARS-CoV-2 target nucleic acids are NOT DETECTED.  The SARS-CoV-2 RNA is generally detectable in upper respiratory specimens during the acute phase of infection. The lowest concentration of SARS-CoV-2 viral copies this assay can detect is 138 copies/mL. A negative result does not preclude SARS-Cov-2 infection and should not be used as the sole basis for treatment or other patient management decisions. A negative result may occur with  improper specimen collection/handling, submission of specimen other than nasopharyngeal swab, presence of viral mutation(s) within the areas targeted by this assay, and inadequate number of viral copies(<138 copies/mL). A negative result must be combined with clinical observations, patient history, and epidemiological information. The expected result is Negative.  Fact Sheet for Patients:  EntrepreneurPulse.com.au  Fact Sheet for Healthcare Providers:  IncredibleEmployment.be  This test is no t yet approved or cleared by the [place] FDA and  has been authorized for detection and/or diagnosis of SARS-CoV-2 by FDA under an Emergency Use Authorization (EUA). This EUA  will remain  in effect (meaning this test can be used) for the duration of the COVID-19 declaration under Section 564(b)(1) of the Act, 21 U.S.C.section 360bbb-3(b)(1), unless the authorization is terminated  or revoked sooner.       Influenza A by PCR NEGATIVE NEGATIVE Final   Influenza B by PCR NEGATIVE NEGATIVE Final    Comment: (NOTE) The Xpert Xpress SARS-CoV-2/FLU/RSV plus assay is intended as an aid in the diagnosis of influenza from Nasopharyngeal swab specimens and should not be used as a sole basis for treatment. Nasal washings and aspirates are unacceptable for Xpert Xpress SARS-CoV-2/FLU/RSV testing.  Fact Sheet for Patients: EntrepreneurPulse.com.au  Fact Sheet for Healthcare Providers: IncredibleEmployment.be  This test is not yet approved or cleared by the [place] FDA and has been authorized for detection and/or diagnosis of SARS-CoV-2 by FDA under an Emergency Use Authorization (EUA). This EUA will remain in effect (meaning this test can be used) for the duration of the COVID-19 declaration under Section 564(b)(1) of the Act, 21 U.S.C. section 360bbb-3(b)(1), unless the authorization is terminated or revoked.  Performed at [facility], [place] [address], [place], [place] [address]      Time coordinating discharge: Over 30 minutes  SIGNED:   Darliss Cheney, MD  [facility] 10/26/2021, 10:08 AM  If 7PM-7AM, please contact night-coverage [email or web address]

## 2021-10-26 NOTE — Progress Notes (Signed)
Occupational Therapy Treatment Patient Details Name: [name] MRN: [id number] DOB: [birth year] Today's Date: 10/26/2021   History of present illness 75 y.o. M who presents with confusion and inability to speak. Found to have hypoglycemia and hypothermia. CT head, MRI brain and MRA head all negative for acute stroke. Significant PMH: DM2, CKD stage IIIa, HTN, CAD, HLD, gout.   OT comments  Patient received in bed and agreeable to OT treatment. Patient was supervision to get to EOB and to donn gown. Patient was able to ambulate to bathroom and perform shower transfer and hand hygiene with supervision. Patient was provided handout for energy conservation and discussed with patient. Patient demonstrated good understanding of techniques and states he is currently using some currently at home. Acute OT to continue to follow.    Recommendations for follow up therapy are one component of a multi-disciplinary discharge planning process, led by the attending physician.  Recommendations may be updated based on patient status, additional functional criteria and insurance authorization.    Follow Up Recommendations  No OT follow up    Assistance Recommended at Discharge None  Equipment Recommendations  BSC/3in1;Other (comment)    Recommendations for Other Services      Precautions / Restrictions Precautions Precautions: Fall Restrictions Weight Bearing Restrictions: No       Mobility Bed Mobility Overal bed mobility: Needs Assistance Bed Mobility: Supine to Sit     Supine to sit: Supervision     General bed mobility comments: used rails to assist    Transfers Overall transfer level: Needs assistance Equipment used: None Transfers: Sit to/from Stand Sit to Stand: Supervision           General transfer comment: no assistive used for mobility and transfers     Balance Overall balance assessment: Mild deficits observed, not formally tested                                          ADL either performed or assessed with clinical judgement   ADL Overall ADL's : Needs assistance/impaired     Grooming: Wash/dry hands;Supervision/safety;Standing Grooming Details (indicate cue type and reason): distant supervision standing at sink         Upper Body Dressing : Set up;Sitting Upper Body Dressing Details (indicate cue type and reason): donned gown             Tub/ Shower Transfer: Walk-in shower;BSC/3in1;Grab Financial risk analyst Details (indicate cue type and reason): performed shower transfer in walk in shower using rail for support to step over small lip Functional mobility during ADLs: Min guard General ADL Comments: performed UB dressing, shower transfer and hand hygiene standing at sink with supervision    Extremity/Trunk Assessment              Vision       Perception     Praxis      Cognition Arousal/Alertness: Awake/alert Behavior During Therapy: WFL for tasks assessed/performed Overall Cognitive Status: Within Functional Limits for tasks assessed                                 General Comments: demonstrated good understanding of energy conservation techniques          Exercises     Shoulder Instructions       General Comments hand out  for energy conservation provided and discussed with patient    Pertinent Vitals/ Pain       Pain Assessment: Faces Faces Pain Scale: Hurts a little bit Pain Location: L knee pain (chronic) Pain Descriptors / Indicators: Discomfort Pain Intervention(s): Monitored during session  Home Living                                          Prior Functioning/Environment              Frequency  Min 2X/week        Progress Toward Goals  OT Goals(current goals can now be found in the care plan section)  Progress towards OT goals: Progressing toward goals  Acute Rehab OT Goals Patient Stated Goal: go  home OT Goal Formulation: With patient Time For Goal Achievement: 11/07/21 Potential to Achieve Goals: Good ADL Goals Pt Will Perform Tub/Shower Transfer: Shower transfer;3 in 1;with modified independence Additional ADL Goal #1: Pt will independently verbalize three energy conservation techniques for ADLs and IADLs  Plan Discharge plan remains appropriate    Co-evaluation                 AM-PAC OT "6 Clicks" Daily Activity     Outcome Measure   Help from another person eating meals?: None Help from another person taking care of personal grooming?: None Help from another person toileting, which includes using toliet, bedpan, or urinal?: A Little Help from another person bathing (including washing, rinsing, drying)?: A Little Help from another person to put on and taking off regular upper body clothing?: None Help from another person to put on and taking off regular lower body clothing?: A Little 6 Click Score: 21    End of Session    OT Visit Diagnosis: Unsteadiness on feet (R26.81);Other abnormalities of gait and mobility (R26.89);Muscle weakness (generalized) (M62.81);Pain Pain - Right/Left: Left Pain - part of body: Knee   Activity Tolerance Patient tolerated treatment well   Patient Left in chair;with call bell/phone within reach;with chair alarm set   Nurse Communication Mobility status        Time: 9629-5284 OT Time Calculation (min): 19 min  Charges: OT General Charges $OT Visit: 1 Visit OT Treatments $Self Care/Home Management : 8-22 mins  Lodema Hong, [place]  Pager [phone] Office [place] 10/26/2021, 9:03 AM

## 2021-10-26 NOTE — TOC Transition Note (Addendum)
Transition of Care [facility]) - CM/SW Discharge Note   Patient Details  Name: [name] MRN: [id number] Date of Birth: 1946/08/02  Transition of Care [facility] CM/SW Contact:  Tom-Johnson, Renea Ee, RN Phone Number: 10/26/2021, 10:25 AM   Clinical Narrative:    Patient is scheduled for discharge today. Bedside commode and Cane ordered from [employer] and Freda Munro [id number]) to deliver at bedside. No followup from PT/OT. Patient denies any needs. Wife to transport at discharge. No further TOC needs noted.   Final next level of care: Home/Self Care Barriers to Discharge: Barriers Resolved   Patient Goals and CMS Choice Patient states their goals for this hospitalization and ongoing recovery are:: To go home CMS Medicare.gov Compare Post Acute Care list provided to:: Patient Choice offered to / list presented to : NA  Discharge Placement                       Discharge Plan and Services   Discharge Planning Services: CM Consult Post Acute Care Choice: NA          DME Arranged: N/A DME Agency: NA       HH Arranged: NA HH Agency: NA        Social Determinants of Health (SDOH) Interventions     Readmission Risk Interventions No flowsheet data found.

## 2021-10-26 NOTE — Discharge Instructions (Signed)
Please take aspirin 81 mg along with Plavix 75 mg p.o. daily for 3 months and then stop taking aspirin but continue taking Plavix.

## 2021-10-27 ENCOUNTER — Telehealth: Payer: Self-pay

## 2021-10-27 NOTE — Telephone Encounter (Signed)
Transition Care Management Follow-up Telephone Call Date of discharge and from where: [place] 10-26-21 Dx: acute renal failure  How have you been since you were released from the hospital? Much better  Any questions or concerns? Yes  Items Reviewed: Did the pt receive and understand the discharge instructions provided? Yes  Medications obtained and verified? Yes  Other? No  Any new allergies since your discharge? No  Dietary orders reviewed? No Do you have support at home? No   Home Care and Equipment/Supplies: Were home health services ordered? no    Were any new equipment or medical supplies ordered?  No What is the name of the medical supply agency? na Were you able to get the supplies/equipment? not applicable Do you have any questions related to the use of the equipment or supplies? No  Functional Questionnaire: (I = Independent and D = Dependent) ADLs: I  Bathing/Dressing- I  Meal Prep- I  Eating- I  Maintaining continence- I  Transferring/Ambulation- I  Managing Meds- I  Follow up appointments reviewed:  PCP Hospital f/u appt confirmed? Yes  [facility]? No   Are transportation arrangements needed? No  If their condition worsens, is the pt aware to call PCP or go to the Emergency Dept.? Yes Was the patient provided with contact information for the PCP's office or ED? Yes Was to pt encouraged to call back with questions or concerns? Yes

## 2021-10-28 ENCOUNTER — Telehealth: Payer: Self-pay

## 2021-10-28 ENCOUNTER — Encounter: Payer: Self-pay | Admitting: Adult Health

## 2021-10-28 ENCOUNTER — Ambulatory Visit (INDEPENDENT_AMBULATORY_CARE_PROVIDER_SITE_OTHER): Payer: BC Managed Care – PPO | Admitting: Adult Health

## 2021-10-28 VITALS — BP 140/82 | HR 96 | Temp 97.8°F | Resp 18 | Wt 289.0 lb

## 2021-10-28 DIAGNOSIS — Z8639 Personal history of other endocrine, nutritional and metabolic disease: Secondary | ICD-10-CM | POA: Diagnosis not present

## 2021-10-28 DIAGNOSIS — I10 Essential (primary) hypertension: Secondary | ICD-10-CM

## 2021-10-28 DIAGNOSIS — E871 Hypo-osmolality and hyponatremia: Secondary | ICD-10-CM

## 2021-10-28 DIAGNOSIS — E875 Hyperkalemia: Secondary | ICD-10-CM | POA: Diagnosis not present

## 2021-10-28 DIAGNOSIS — E1151 Type 2 diabetes mellitus with diabetic peripheral angiopathy without gangrene: Secondary | ICD-10-CM

## 2021-10-28 DIAGNOSIS — E872 Acidosis, unspecified: Secondary | ICD-10-CM | POA: Diagnosis not present

## 2021-10-28 DIAGNOSIS — N1831 Chronic kidney disease, stage 3a: Secondary | ICD-10-CM | POA: Diagnosis not present

## 2021-10-28 DIAGNOSIS — R4701 Aphasia: Secondary | ICD-10-CM

## 2021-10-28 DIAGNOSIS — N170 Acute kidney failure with tubular necrosis: Secondary | ICD-10-CM

## 2021-10-28 LAB — CULTURE, BLOOD (ROUTINE X 2)
Culture: NO GROWTH
Culture: NO GROWTH
Special Requests: ADEQUATE

## 2021-10-28 NOTE — Patient Instructions (Signed)
It was great meeting you today   Please monitor your blood pressure and blood sugar. Let Dr. Volanda Napoleon know if these start to go up.   Also follow up with Dr. Volanda Napoleon in 3 months

## 2021-10-28 NOTE — Progress Notes (Signed)
Subjective:    Patient ID: [name], male    DOB: 08-Jul-1946, 75 y.o.   MRN: [id number]  HPI 75 year old male who  has a past medical history of CAD, ARTERY BYPASS GRAFT (02/10/2008), CAROTID ARTERY STENOSIS, WITHOUT INFARCTION (09/22/2008), CORONARY ARTERY DISEASE (04/03/2008), Diabetes mellitus, ERECTILE DYSFUNCTION (01/14/2009), GOUT (01/16/2008), HERPES ZOSTER (04/15/2010), HYPERLIPIDEMIA (01/16/2008), HYPERTENSION (01/16/2008), IMPAIRED GLUCOSE TOLERANCE (08/22/2010), Shingles (2011), and Ulcer (30 yrs ago).  He is a patient of Dr. Volanda Napoleon who is out of the office today.   He is being seen for TCM visit   Admit Date: 10/23/2021 Discharge Date 10/26/2021  Per hospital discharge note he was brought into the emergency department with the inability to speak.  EMS was called and reportedly EMS found his blood sugar to be 62, he was given something to drink and blood sugar improved.  In the emergency room patient was able to speak was found to be mildly hypothermic with a temperature of 93 F.  He was placed on a Bair hugger and given some IV fluids.  He was also given a half amp of D50 in the emergency room which kept his blood sugars in the 80-90 range.  He also had hyponatremia and hyperkalemia as well as AKI on CKD stage IIIa.  He was admitted under the hospital service.  Hospital Course  AKI on CKD stage IIIa -Creatinine back to baseline after IV fluids  Aphasia/stroke like symptoms/TIA  Went stroke work-up with CT head, MRI head and MRA-negative for stroke however his carotid ultrasound showed more than 50% stenosed ECA and antegrade flow in left vertebral but retrograde flow in right vertebral.  Vascular surgery was consulted, findings were considered insignificant   MRA of the brain showed  Anterior circulation: Extensive atheromatous irregularity and high-grade narrowing of the bilateral carotid siphons, stenosis greatest on the right at the posterior genu and left at the  anterior genu. Irregular appearance of the ICA at the skull base which is usually from bone interface artifact. Intracranial branch irregularities attributed atherosclerosis, mild when compared to the ICA disease. Negative for aneurysm   Posterior circulation: Left dominant vertebral artery. Atheromatous irregularity and narrowing of the right V4 segment. No branch occlusion, beading, or aneurysm.   Anatomic variants: None significant  Neurology was consulted and recommended only medical management starting patient on DAPT for 3 months and then Plavix alone since the patient is already taking aspirin.  Diabetes mellitus type 2 Initially  presented with hypoglycemia.  Hemoglobin A1c 5.8.  He does take glipizide at home.  Discontinued glipizide and advised diet controlled.  Hyponatremia Resolved  Essential hypertension -Controlled while admitted only on amlodipine, at home he is also on chlorthalidone and lisinopril.  Discharge he was resumed on his lisinopril dose and amlodipine but chlorthalidone was held  Hyperkalemia Resolved.  Patient received dextrose and IV insulin and admission  Metabolic Acidosis  -CO2 remained stable at 15.  Discussed with nephrology who recommended starting patient on bicarb p.o. tablets, with this his CO2 improved to 19.  He was discharged with once daily bicarb and advised to follow-up with nephrology in 1 to 2 weeks  Today he reports that he is feeling well, still a little fatigued but overall he feels back to baseline. He went back to work already.   He has been monitoring his BP at home an reports readings in the 130-140's at home and BS in the 120 or below range. No episodes of hypoglycemia.   He  already has an appointment with his nephrologist and will be getting blood work next week for them.   He has nothing he would like to discuss today   Review of Systems  Constitutional: Negative.   Respiratory: Negative.    Cardiovascular:  Positive for  leg swelling (chronic).  Genitourinary: Negative.   Neurological: Negative.   Psychiatric/Behavioral: Negative.    All other systems reviewed and are negative. Past Medical History:  Diagnosis Date   CAD, ARTERY BYPASS GRAFT 02/10/2008   CAROTID ARTERY STENOSIS, WITHOUT INFARCTION 09/22/2008   CORONARY ARTERY DISEASE 04/03/2008   Diabetes mellitus    "borderline"   ERECTILE DYSFUNCTION 01/14/2009   GOUT 01/16/2008   HERPES ZOSTER 04/15/2010   HYPERLIPIDEMIA 01/16/2008   HYPERTENSION 01/16/2008   IMPAIRED GLUCOSE TOLERANCE 08/22/2010   Shingles 2011   left chest   Ulcer 30 yrs ago   stomach    Social History   Socioeconomic History   Marital status: Married    Spouse name: Not on file   Number of children: 3   Years of education: Not on file   Highest education level: Not on file  Occupational History    Employer: [employer]  Tobacco Use   Smoking status: Former    Packs/day: 1.00    Years: 10.00    Pack years: 10.00    Types: Cigarettes    Quit date: 07/27/1999    Years since quitting: 22.2   Smokeless tobacco: Never  Substance and Sexual Activity   Alcohol use: Yes    Comment: 4 beers a day   Drug use: No   Sexual activity: Not on file  Other Topics Concern   Not on file  Social History Narrative   epworth sleepiness scale score: 4   Social Determinants of Health   Financial Resource Strain: Low Risk    Difficulty of Paying Living Expenses: Not hard at all  Food Insecurity: No Food Insecurity   Worried About Charity fundraiser in the Last Year: Never true   Arboriculturist in the Last Year: Never true  Transportation Needs: No Transportation Needs   Lack of Transportation (Medical): No   Lack of Transportation (Non-Medical): No  Physical Activity: Inactive   Days of Exercise per Week: 0 days   Minutes of Exercise per Session: 0 min  Stress: No Stress Concern Present   Feeling of Stress : Not at all  Social Connections: Moderately Isolated   Frequency  of Communication with Friends and Family: Twice a week   Frequency of Social Gatherings with Friends and Family: Twice a week   Attends Religious Services: Never   Marine scientist or Organizations: No   Attends Music therapist: Never   Marital Status: Married  Human resources officer Violence: Not At Risk   Fear of Current or Ex-Partner: No   Emotionally Abused: No   Physically Abused: No   Sexually Abused: No    Past Surgical History:  Procedure Laterality Date   COLONOSCOPY     3 months ago   CORONARY ARTERY BYPASS GRAFT  2009   vessels x3   EAR CYST EXCISION N/A 07/29/2013   Procedure: excision of urachal cyst flexible cystoscopy insertion of foley cath;  Surgeon: Fredricka Bonine, MD;  Location: [facility];  Service: Urology;  Laterality: N/A;   LAPAROSCOPY N/A 07/29/2013   Procedure: LAPAROSCOPY DIAGNOSTIC  laparoscopic excision of urachal cyst ;  Surgeon: Madilyn Hook, DO;  Location: [facility];  Service: General;  Laterality: N/A;   LAPAROTOMY  08/25/2012   Procedure: EXPLORATORY LAPAROTOMY;  Surgeon: Madilyn Hook, DO;  Location: [facility];  Service: General;  Laterality: N/A;  incision and drainage abdominal wall abcessand intra abdominal wall abcess   PARTIAL GASTRECTOMY  1974   bleeding ulcers   urachal  2014   Urachal cyst removal    Family History  Problem Relation Age of Onset   Heart disease Mother    Heart disease Sister    Colon cancer Neg Hx     No Known Allergies  Current Outpatient Medications on File Prior to Visit  Medication Sig Dispense Refill   allopurinol (ZYLOPRIM) 100 MG tablet TAKE 1 TABLET BY MOUTH  DAILY (Patient taking differently: Take 100 mg by mouth daily.) 90 tablet 3   amLODipine (NORVASC) 10 MG tablet TAKE 1 TABLET BY MOUTH  DAILY (Patient taking differently: Take 10 mg by mouth daily.) 90 tablet 3   aspirin EC 81 MG EC tablet Take 1 tablet (81 mg total) by mouth daily. Swallow whole. 30 tablet 2   atorvastatin (LIPITOR) 80 MG  tablet TAKE 1 TABLET BY MOUTH EVERY MORNING. (Patient taking differently: Take 80 mg by mouth daily.) 90 tablet 3   clopidogrel (PLAVIX) 75 MG tablet Take 1 tablet (75 mg total) by mouth daily. 30 tablet 0   colchicine 0.6 MG tablet Take 2 tabs (1.2mg ) at fist sign of gout flare.  Then take 1 tab 1 hour later.  On day 2 take one tab daily until flare stops. 20 tablet 3   Continuous Blood Gluc Receiver (FREESTYLE LIBRE 14 DAY READER) DEVI Use as directed 1 each 0   Continuous Blood Gluc Sensor (FREESTYLE LIBRE SENSOR SYSTEM) MISC Apply device as directed every 2 weeks. 2 each 11   gabapentin (NEURONTIN) 100 MG capsule TAKE 1 CAPSULE BY MOUTH EVERYDAY AT BEDTIME (Patient taking differently: 100 mg at bedtime as needed (pain).) 90 capsule 1   glucose blood (ONE TOUCH ULTRA TEST) test strip 1 each by Other route daily. Use as instructed 100 each 12   Lancets (ONETOUCH ULTRASOFT) lancets 1 each by Other route daily. Use as instructed 100 each 12   lisinopril (ZESTRIL) 40 MG tablet Take 1 tablet (40 mg total) by mouth daily. 90 tablet 1   LOKELMA 10 g PACK packet Take 1 packet by mouth daily.     nitroGLYCERIN (NITROSTAT) 0.4 MG SL tablet Place 1 tablet (0.4 mg total) under the tongue every 5 (five) minutes as needed for chest pain. 15 tablet 5   sildenafil (VIAGRA) 25 MG tablet Take 1 tablet (25 mg total) by mouth daily as needed for erectile dysfunction. 10 tablet 1   sodium bicarbonate 650 MG tablet Take 1 tablet (650 mg total) by mouth daily. 30 tablet 0   tamsulosin (FLOMAX) 0.4 MG CAPS capsule TAKE 2 CAPSULES BY MOUTH EVERY DAY (Patient taking differently: 0.8 mg daily.) 180 capsule 1   traMADol (ULTRAM) 50 MG tablet TAKE 2 TABLETS (100 MG TOTAL) BY MOUTH EVERY 12 (TWELVE) HOURS AS NEEDED. (Patient taking differently: Take 100 mg by mouth every 12 (twelve) hours as needed for moderate pain.) 120 tablet 0   No current facility-administered medications on file prior to visit.    BP 140/82 (BP  Location: Left Arm, Patient Position: Sitting, Cuff Size: Large)    Pulse 96    Temp 97.8 F (36.6 C) (Oral)    Resp 18    Wt 289 lb (131.1  kg)    SpO2 100%    BMI 41.47 kg/m       Objective:   Physical Exam Vitals and nursing note reviewed.  Constitutional:      Appearance: Normal appearance. He is obese.  Cardiovascular:     Rate and Rhythm: Normal rate and regular rhythm.     Pulses: Normal pulses.     Heart sounds: Normal heart sounds.  Pulmonary:     Effort: Pulmonary effort is normal.     Breath sounds: Normal breath sounds.  Abdominal:     General: Abdomen is flat. Bowel sounds are normal.     Palpations: Abdomen is soft.  Musculoskeletal:        General: Normal range of motion.  Skin:    General: Skin is warm and dry.  Neurological:     General: No focal deficit present.     Mental Status: He is alert and oriented to person, place, and time.  Psychiatric:        Mood and Affect: Mood normal.        Behavior: Behavior normal.        Thought Content: Thought content normal.        Judgment: Judgment normal.      Assessment & Plan:  1. Acute renal failure with acute tubular necrosis superimposed on stage 3a chronic kidney disease ([place]) - Reviewed hospital notes, labs,imaging and discharge instructions with patient. All questions answered to the best of my ability  - Follow up with nephrology as directed  2. Type 2 diabetes mellitus with peripheral vascular disease ([facility]) - Continue to monitor BS at home - Follow up with PCP if BS trending up  - Follow up in three months   3. Hyponatremia - resolved  4. Essential hypertension - Continue to monitor at home - Continue lisinopril and Norvasc   5. Hyperkalemia   6. Metabolic acidosis - Continue with bicarb - Follow up with Nephrology   7. Aphasia - Follow up with Neurology  - Continue DAPT x 3 months and then Plavix alone   Dorothyann Peng, NP

## 2021-10-28 NOTE — Telephone Encounter (Signed)
Voicemail left on patient's cell phone to see if he could come in early for appt. 12pm

## 2021-11-02 DIAGNOSIS — N183 Chronic kidney disease, stage 3 unspecified: Secondary | ICD-10-CM | POA: Diagnosis not present

## 2021-11-08 ENCOUNTER — Telehealth: Payer: Self-pay | Admitting: Hematology and Oncology

## 2021-11-08 NOTE — Telephone Encounter (Signed)
Scheduled appt per 1/3 referral. Pt is aware of appt date and time. Pt is aware to arrive 15 mins prior to appt.

## 2021-11-23 ENCOUNTER — Inpatient Hospital Stay: Payer: BC Managed Care – PPO | Attending: Hematology and Oncology | Admitting: Hematology and Oncology

## 2021-11-23 ENCOUNTER — Other Ambulatory Visit: Payer: Self-pay

## 2021-11-23 ENCOUNTER — Inpatient Hospital Stay: Payer: BC Managed Care – PPO

## 2021-11-23 VITALS — BP 186/67 | HR 83 | Temp 97.8°F | Resp 18 | Wt 284.0 lb

## 2021-11-23 DIAGNOSIS — D472 Monoclonal gammopathy: Secondary | ICD-10-CM

## 2021-11-23 DIAGNOSIS — I10 Essential (primary) hypertension: Secondary | ICD-10-CM | POA: Diagnosis not present

## 2021-11-23 DIAGNOSIS — Z87891 Personal history of nicotine dependence: Secondary | ICD-10-CM | POA: Insufficient documentation

## 2021-11-23 DIAGNOSIS — R778 Other specified abnormalities of plasma proteins: Secondary | ICD-10-CM | POA: Insufficient documentation

## 2021-11-23 DIAGNOSIS — E119 Type 2 diabetes mellitus without complications: Secondary | ICD-10-CM | POA: Diagnosis not present

## 2021-11-23 LAB — CBC WITH DIFFERENTIAL (CANCER CENTER ONLY)
Abs Immature Granulocytes: 0.01 10*3/uL (ref 0.00–0.07)
Basophils Absolute: 0 10*3/uL (ref 0.0–0.1)
Basophils Relative: 1 %
Eosinophils Absolute: 0.2 10*3/uL (ref 0.0–0.5)
Eosinophils Relative: 3 %
HCT: 34 % — ABNORMAL LOW (ref 39.0–52.0)
Hemoglobin: 11 g/dL — ABNORMAL LOW (ref 13.0–17.0)
Immature Granulocytes: 0 %
Lymphocytes Relative: 28 %
Lymphs Abs: 1.7 10*3/uL (ref 0.7–4.0)
MCH: 31.3 pg (ref 26.0–34.0)
MCHC: 32.4 g/dL (ref 30.0–36.0)
MCV: 96.6 fL (ref 80.0–100.0)
Monocytes Absolute: 0.6 10*3/uL (ref 0.1–1.0)
Monocytes Relative: 11 %
Neutro Abs: 3.5 10*3/uL (ref 1.7–7.7)
Neutrophils Relative %: 57 %
Platelet Count: 222 10*3/uL (ref 150–400)
RBC: 3.52 MIL/uL — ABNORMAL LOW (ref 4.22–5.81)
RDW: 14.9 % (ref 11.5–15.5)
WBC Count: 6 10*3/uL (ref 4.0–10.5)
nRBC: 0 % (ref 0.0–0.2)

## 2021-11-23 LAB — CMP (CANCER CENTER ONLY)
ALT: 18 U/L (ref 0–44)
AST: 20 U/L (ref 15–41)
Albumin: 3.4 g/dL — ABNORMAL LOW (ref 3.5–5.0)
Alkaline Phosphatase: 105 U/L (ref 38–126)
Anion gap: 4 — ABNORMAL LOW (ref 5–15)
BUN: 18 mg/dL (ref 8–23)
CO2: 25 mmol/L (ref 22–32)
Calcium: 9.3 mg/dL (ref 8.9–10.3)
Chloride: 107 mmol/L (ref 98–111)
Creatinine: 1.31 mg/dL — ABNORMAL HIGH (ref 0.61–1.24)
GFR, Estimated: 57 mL/min — ABNORMAL LOW (ref >60)
Glucose, Bld: 117 mg/dL — ABNORMAL HIGH (ref 70–99)
Potassium: 4.9 mmol/L (ref 3.5–5.1)
Sodium: 136 mmol/L (ref 135–145)
Total Bilirubin: 0.7 mg/dL (ref 0.3–1.2)
Total Protein: 7.6 g/dL (ref 6.5–8.1)

## 2021-11-23 LAB — LACTATE DEHYDROGENASE: LDH: 137 U/L (ref 98–192)

## 2021-11-23 NOTE — Progress Notes (Signed)
[place] [phone]   [phone] [place] NOTE  Patient Care Team: [name], MD as PCP - General (Family Medicine) [name], [place] as Referring Physician (Optometry)  Hematological/Oncological History # IgA kappa Monoclonal Gammopathy  10/05/2021: evaluated by [facility]. Found to have M protein 0.6, Kappa 130, Lambda 40.3, Ratio 3.25. IFE shows an IgA Kappa monoclonal protein. Cr 2.00 11/23/2021: establish care with Dr. Lorenso Courier   CHIEF COMPLAINTS/PURPOSE OF CONSULTATION:  "Monoclonal Gammopathy "  HISTORY OF PRESENTING ILLNESS:  [name] 76 y.o. male with medical history significant for DM type II, CAD, carotid artery stenosis, HLD, and HTN who presents for evaluation of an IgA monoclonal gammopathy.   On review of the previous records [name] was seen by [facility] on 10/05/2021.  At that time he had an SPEP collected which showed an M protein of 0.6 with IFE showing an IgA kappa specificity.  Light chains showed a kappa of 130, lambda 40.3, and a ratio of 3.25.  Due to concern for these findings the patient was referred to hematology for further evaluation and management.  On exam today [name] reports that his energy levels have been fine and his weight has been stable.  He notes he has not noticed any changes in his urinary habits over the last several weeks.  He has been at his baseline level of health with no fevers, chills, sweats, nausea, vomiting or diarrhea.  Full 10 point ROS is listed below.  On further discussion he notes that he was a former smoker but quit 40 years ago.  He notes that he does drink quite a lot of beer and drinks about 5 to 6/day.  He notes that he used to work for a Production designer, theatre/television/film, [employer].  He notes that his father passed away of old age and his mother was a smoker.  He notes that he has 2 healthy children and 6 grandchildren.  He otherwise feels well  and has no questions concerns or complaints today.  MEDICAL HISTORY:  Past Medical History:  Diagnosis Date   CAD, ARTERY BYPASS GRAFT 02/10/2008   CAROTID ARTERY STENOSIS, WITHOUT INFARCTION 09/22/2008   CORONARY ARTERY DISEASE 04/03/2008   Diabetes mellitus    "borderline"   ERECTILE DYSFUNCTION 01/14/2009   GOUT 01/16/2008   HERPES ZOSTER 04/15/2010   HYPERLIPIDEMIA 01/16/2008   HYPERTENSION 01/16/2008   IMPAIRED GLUCOSE TOLERANCE 08/22/2010   Shingles 2011   left chest   Ulcer 30 yrs ago   stomach    SURGICAL HISTORY: Past Surgical History:  Procedure Laterality Date   COLONOSCOPY     3 months ago   CORONARY ARTERY BYPASS GRAFT  2009   vessels x3   EAR CYST EXCISION N/A 07/29/2013   Procedure: excision of urachal cyst flexible cystoscopy insertion of foley cath;  Surgeon: Fredricka Bonine, MD;  Location: [facility];  Service: Urology;  Laterality: N/A;   LAPAROSCOPY N/A 07/29/2013   Procedure: LAPAROSCOPY DIAGNOSTIC  laparoscopic excision of urachal cyst ;  Surgeon: Madilyn Hook, DO;  Location: [facility];  Service: General;  Laterality: N/A;   LAPAROTOMY  08/25/2012   Procedure: EXPLORATORY LAPAROTOMY;  Surgeon: Madilyn Hook, DO;  Location: [facility];  Service: General;  Laterality: N/A;  incision and drainage abdominal wall abcessand intra abdominal wall abcess   PARTIAL GASTRECTOMY  1974   bleeding ulcers   urachal  2014   Urachal cyst removal    SOCIAL  HISTORY: Social History   Socioeconomic History   Marital status: Married    Spouse name: Not on file   Number of children: 3   Years of education: Not on file   Highest education level: Not on file  Occupational History    Employer: [employer]  Tobacco Use   Smoking status: Former    Packs/day: 1.00    Years: 10.00    Pack years: 10.00    Types: Cigarettes    Quit date: 07/27/1999    Years since quitting: 22.3   Smokeless tobacco: Never  Substance and Sexual Activity   Alcohol use: Yes    Comment: 4 beers a  day   Drug use: No   Sexual activity: Not on file  Other Topics Concern   Not on file  Social History Narrative   epworth sleepiness scale score: 4   Social Determinants of Health   Financial Resource Strain: Low Risk    Difficulty of Paying Living Expenses: Not hard at all  Food Insecurity: No Food Insecurity   Worried About Charity fundraiser in the Last Year: Never true   Ran Out of Food in the Last Year: Never true  Transportation Needs: No Transportation Needs   Lack of Transportation (Medical): No   Lack of Transportation (Non-Medical): No  Physical Activity: Inactive   Days of Exercise per Week: 0 days   Minutes of Exercise per Session: 0 min  Stress: No Stress Concern Present   Feeling of Stress : Not at all  Social Connections: Moderately Isolated   Frequency of Communication with Friends and Family: Twice a week   Frequency of Social Gatherings with Friends and Family: Twice a week   Attends Religious Services: Never   Marine scientist or Organizations: No   Attends Music therapist: Never   Marital Status: Married  Human resources officer Violence: Not At Risk   Fear of Current or Ex-Partner: No   Emotionally Abused: No   Physically Abused: No   Sexually Abused: No    FAMILY HISTORY: Family History  Problem Relation Age of Onset   Heart disease Mother    Heart disease Sister    Colon cancer Neg Hx     ALLERGIES:  has No Known Allergies.  MEDICATIONS:  Current Outpatient Medications  Medication Sig Dispense Refill   allopurinol (ZYLOPRIM) 100 MG tablet TAKE 1 TABLET BY MOUTH  DAILY (Patient taking differently: Take 100 mg by mouth daily.) 90 tablet 3   amLODipine (NORVASC) 10 MG tablet TAKE 1 TABLET BY MOUTH  DAILY (Patient taking differently: Take 10 mg by mouth daily.) 90 tablet 3   aspirin EC 81 MG EC tablet Take 1 tablet (81 mg total) by mouth daily. Swallow whole. 30 tablet 2   atorvastatin (LIPITOR) 80 MG tablet TAKE 1 TABLET BY MOUTH  EVERY MORNING. (Patient taking differently: Take 80 mg by mouth daily.) 90 tablet 3   colchicine 0.6 MG tablet Take 2 tabs (1.5m) at fist sign of gout flare.  Then take 1 tab 1 hour later.  On day 2 take one tab daily until flare stops. 20 tablet 3   Continuous Blood Gluc Receiver (FREESTYLE LIBRE 14 DAY READER) DEVI Use as directed 1 each 0   Continuous Blood Gluc Sensor (FREESTYLE LIBRE SENSOR SYSTEM) MISC Apply device as directed every 2 weeks. 2 each 11   gabapentin (NEURONTIN) 100 MG capsule TAKE 1 CAPSULE BY MOUTH EVERYDAY AT BEDTIME (Patient taking differently:  100 mg at bedtime as needed (pain).) 90 capsule 1   glucose blood (ONE TOUCH ULTRA TEST) test strip 1 each by Other route daily. Use as instructed 100 each 12   Lancets (ONETOUCH ULTRASOFT) lancets 1 each by Other route daily. Use as instructed 100 each 12   lisinopril (ZESTRIL) 40 MG tablet Take 1 tablet (40 mg total) by mouth daily. 90 tablet 2   LOKELMA 10 g PACK packet Take 1 packet by mouth daily.     nitroGLYCERIN (NITROSTAT) 0.4 MG SL tablet Place 1 tablet (0.4 mg total) under the tongue every 5 (five) minutes as needed for chest pain. 15 tablet 5   sildenafil (VIAGRA) 25 MG tablet Take 1 tablet (25 mg total) by mouth daily as needed for erectile dysfunction. 10 tablet 1   tamsulosin (FLOMAX) 0.4 MG CAPS capsule TAKE 2 CAPSULES BY MOUTH EVERY DAY (Patient taking differently: 0.8 mg daily.) 180 capsule 1   No current facility-administered medications for this visit.    REVIEW OF SYSTEMS:   Constitutional: ( - ) fevers, ( - )  chills , ( - ) night sweats Eyes: ( - ) blurriness of vision, ( - ) double vision, ( - ) watery eyes Ears, nose, mouth, throat, and face: ( - ) mucositis, ( - ) sore throat Respiratory: ( - ) cough, ( - ) dyspnea, ( - ) wheezes Cardiovascular: ( - ) palpitation, ( - ) chest discomfort, ( - ) lower extremity swelling Gastrointestinal:  ( - ) nausea, ( - ) heartburn, ( - ) change in bowel habits Skin: (  - ) abnormal skin rashes Lymphatics: ( - ) new lymphadenopathy, ( - ) easy bruising Neurological: ( - ) numbness, ( - ) tingling, ( - ) new weaknesses Behavioral/Psych: ( - ) mood change, ( - ) new changes  All other systems were reviewed with the patient and are negative.  PHYSICAL EXAMINATION:  Vitals:   11/23/21 1307  BP: (!) 186/67  Pulse: 83  Resp: 18  Temp: 97.8 F (36.6 C)  SpO2: 97%   Filed Weights   11/23/21 1307  Weight: 284 lb (128.8 kg)    GENERAL: well appearing elderly male in NAD  SKIN: skin color, texture, turgor are normal, no rashes or significant lesions EYES: conjunctiva are pink and non-injected, sclera clear LUNGS: clear to auscultation and percussion with normal breathing effort HEART: regular rate & rhythm and no murmurs and no lower extremity edema Musculoskeletal: no cyanosis of digits and no clubbing  PSYCH: alert & oriented x 3, fluent speech NEURO: no focal motor/sensory deficits  LABORATORY DATA:  I have reviewed the data as listed CBC Latest Ref Rng & Units 11/23/2021 10/24/2021 10/23/2021  WBC 4.0 - 10.5 K/uL 6.0 5.0 -  Hemoglobin 13.0 - 17.0 g/dL 11.0(L) 9.5(L) 10.2(L)  Hematocrit 39.0 - 52.0 % 34.0(L) 30.0(L) 30.0(L)  Platelets 150 - 400 K/uL 222 187 -    CMP Latest Ref Rng & Units 11/23/2021 10/26/2021 10/25/2021  Glucose 70 - 99 mg/dL 117(H) 147(H) -  BUN 8 - 23 mg/dL 18 20 -  Creatinine 0.61 - 1.24 mg/dL 1.31(H) 1.29(H) -  Sodium 135 - 145 mmol/L 136 136 -  Potassium 3.5 - 5.1 mmol/L 4.9 5.3(H) 5.4(H)  Chloride 98 - 111 mmol/L 107 113(H) -  CO2 22 - 32 mmol/L 25 19(L) -  Calcium 8.9 - 10.3 mg/dL 9.3 9.1 -  Total Protein 6.5 - 8.1 g/dL 7.6 - -  Total Bilirubin 0.3 -  1.2 mg/dL 0.7 - -  Alkaline Phos 38 - 126 U/L 105 - -  AST 15 - 41 U/L 20 - -  ALT 0 - 44 U/L 18 - -    RADIOGRAPHIC STUDIES: DG Bone Survey Met  Result Date: 12/04/2021 CLINICAL DATA:  Monoclonal gammopathy.  Assess for lytic lesions. EXAM: METASTATIC BONE  SURVEY COMPARISON:  Chest x-ray 10/23/2021, lumbar spine series 09/06/2020 FINDINGS: Single left lateral skull view is unremarkable. The patient is edentulous. No focal lesion is seen in the upper extremities and shoulder girdles, with bilateral osteophytes at the [facility] joints and patchy calcification again noted in the bilateral ulnar arteries. Views of the spine demonstrate osteopenia. There is degenerative disc disease and spondylosis in the mid to lower cervical spine and multilevel facet hypertrophy. In the thoracic spine there is extensive multilevel bridging enthesopathy of DI SH. Lumbar spine shows extensive bridging osteophytosis, vertebral body ankylosis L5-S1. No focal spinal lesion is suspected. The pelvis and sacrum are unremarkable apart from ankylosis across both SI joints. In the lower extremities no focal osseous lesion is seen. There is mild degenerative arthrosis at the knee joints and bilateral stranding edema in the forelegs and ankles, with moderate bilateral swelling at the ankles. There is also tubular calcific plaque in the bilateral femoral and popliteal trifurcation arteries consistent with diabetic atherosclerosis, with surgical clips in the medial upper right foreleg. Miscellaneous: Old CABG. Normal cardiac size and vascular pattern with clear lungs. The abdominal aorta is heavily calcified. Borderline infrarenal AAA 2.9 cm AP. No dilated small bowel is seen. No pathologic calcification at the level of the kidneys. IMPRESSION: 1. No lytic or blastic bone lesion on standard skeletal survey images. 2. Aortic atherosclerosis.  Borderline infrarenal AAA. 3. Extensive bridging bone thoracolumbar spine. 4. Bilateral edema in the foreleg and ankles. 5. Vascular calcifications in both upper and lower extremities. Electronically Signed   By: Telford Nab M.D.   On: 12/04/2021 02:16    ASSESSMENT & PLAN [name] 76 y.o. male with medical history significant for DM type II, CAD, carotid  artery stenosis, HLD, and HTN who presents for evaluation of an IgA monoclonal gammopathy.   After review of the labs, review of the records, and discussion with the patient the patients findings are most consistent with an IgA kappa monoclonal gammopathy.  Due to the IgA nature of the monoclonal colopathy as well as the elevated serum free light chain ratio I recommend that we pursue a bone marrow biopsy.  Additionally we will collect repeat SPEP and serum free light chains as well as UPEP and beta-2 microglobulin.  We will recollect a new baseline CBC, CMP, and LDH.  Additionally we will order a metastatic survey to assess for lytic lesions.  We will plan of the patient return to clinic pending the results of the above studies.  #IgA Kappa Monoclonal Gammopathy of Undetermined Significance --today will order an SPEP, UPEP, SFLC and beta 2 microglobulin --additionally will collect new baseline CBC, CMP, and LDH --recommend a metastatic bone survey to assess for lytic lesions --will need a bone marrow biopsy due to having an elevated ratio and non IgG protein.  --RTC pending the results of the above studies. Will make placeholder appointment in 4 weeks time.   Orders Placed This Encounter  Procedures   DG Bone Survey Met    Standing Status:   Future    Number of Occurrences:   1    Standing Expiration Date:   11/23/2022  Order Specific Question:   Reason for Exam (SYMPTOM  OR DIAGNOSIS REQUIRED)    Answer:   monoclonal gammopathy, assess for lytic lesions.    Order Specific Question:   Preferred imaging location?    Answer:   [facility]   CT BONE MARROW BIOPSY & ASPIRATION    Standing Status:   Future    Standing Expiration Date:   12/04/2022    Order Specific Question:   Reason for Exam (SYMPTOM  OR DIAGNOSIS REQUIRED)    Answer:   concern for multiple myeloma, requesting bone marrow biopsy    Order Specific Question:   Preferred location?    Answer:   [facility]   CT  BIOPSY    Standing Status:   Future    Standing Expiration Date:   12/04/2022    Order Specific Question:   Lab orders requested (DO NOT place separate lab orders, these will be automatically ordered during procedure specimen collection):    Answer:   Surgical Pathology    Order Specific Question:   Reason for Exam (SYMPTOM  OR DIAGNOSIS REQUIRED)    Answer:   concern for multiple myeloma, requesting bone marrow biopsy    Order Specific Question:   Preferred location?    Answer:   [facility]   CBC with Differential (Cancer Center Only)    Standing Status:   Future    Number of Occurrences:   1    Standing Expiration Date:   11/23/2022   CMP ([place] only)    Standing Status:   Future    Number of Occurrences:   1    Standing Expiration Date:   11/23/2022   Lactate dehydrogenase (LDH)    Standing Status:   Future    Number of Occurrences:   1    Standing Expiration Date:   11/23/2022   Multiple Myeloma Panel (SPEP&IFE w/QIG)    Standing Status:   Future    Number of Occurrences:   1    Standing Expiration Date:   11/23/2022   Kappa/lambda light chains    Standing Status:   Future    Number of Occurrences:   1    Standing Expiration Date:   11/23/2022   Beta 2 microglobulin    Standing Status:   Future    Number of Occurrences:   1    Standing Expiration Date:   11/23/2022   24-Hr Ur UPEP/UIFE/Light Chains/TP    Standing Status:   Future    Number of Occurrences:   1    Standing Expiration Date:   11/23/2022    All questions were answered. The patient knows to call the clinic with any problems, questions or concerns.  A total of more than 60 minutes were spent on this encounter with face-to-face time and non-face-to-face time, including preparing to see the patient, ordering tests and/or medications, counseling the patient and coordination of care as outlined above.   Ledell Peoples, MD Department of Hematology/Oncology [place] [facility] Phone: [phone] Pager: [phone] Email: [email or web address]  12/04/2021 12:47 PM

## 2021-11-24 LAB — BETA 2 MICROGLOBULIN, SERUM: Beta-2 Microglobulin: 2.9 mg/L — ABNORMAL HIGH (ref 0.6–2.4)

## 2021-11-24 LAB — KAPPA/LAMBDA LIGHT CHAINS
Kappa free light chain: 96 mg/L — ABNORMAL HIGH (ref 3.3–19.4)
Kappa, lambda light chain ratio: 2.91 — ABNORMAL HIGH (ref 0.26–1.65)
Lambda free light chains: 33 mg/L — ABNORMAL HIGH (ref 5.7–26.3)

## 2021-11-26 ENCOUNTER — Other Ambulatory Visit: Payer: Self-pay | Admitting: Family Medicine

## 2021-11-28 DIAGNOSIS — Z87891 Personal history of nicotine dependence: Secondary | ICD-10-CM | POA: Diagnosis not present

## 2021-11-28 DIAGNOSIS — R778 Other specified abnormalities of plasma proteins: Secondary | ICD-10-CM | POA: Diagnosis not present

## 2021-11-28 DIAGNOSIS — I10 Essential (primary) hypertension: Secondary | ICD-10-CM | POA: Diagnosis not present

## 2021-11-28 DIAGNOSIS — E119 Type 2 diabetes mellitus without complications: Secondary | ICD-10-CM | POA: Diagnosis not present

## 2021-11-28 DIAGNOSIS — D472 Monoclonal gammopathy: Secondary | ICD-10-CM | POA: Diagnosis not present

## 2021-11-28 LAB — MULTIPLE MYELOMA PANEL, SERUM
Albumin SerPl Elph-Mcnc: 3.3 g/dL (ref 2.9–4.4)
Albumin/Glob SerPl: 0.9 (ref 0.7–1.7)
Alpha 1: 0.2 g/dL (ref 0.0–0.4)
Alpha2 Glob SerPl Elph-Mcnc: 0.7 g/dL (ref 0.4–1.0)
B-Globulin SerPl Elph-Mcnc: 1.7 g/dL — ABNORMAL HIGH (ref 0.7–1.3)
Gamma Glob SerPl Elph-Mcnc: 1.3 g/dL (ref 0.4–1.8)
Globulin, Total: 3.9 g/dL (ref 2.2–3.9)
IgA: 942 mg/dL — ABNORMAL HIGH (ref 61–437)
IgG (Immunoglobin G), Serum: 1404 mg/dL (ref 603–1613)
IgM (Immunoglobulin M), Srm: 24 mg/dL (ref 15–143)
M Protein SerPl Elph-Mcnc: 0.8 g/dL — ABNORMAL HIGH
Total Protein ELP: 7.2 g/dL (ref 6.0–8.5)

## 2021-11-28 NOTE — Addendum Note (Signed)
Addended by: Elza Rafter D on: 11/28/2021 06:02 PM   Modules accepted: Orders

## 2021-11-28 NOTE — Telephone Encounter (Signed)
Lisinopril was increased to 40 mg daily in July 2022.  Pt recently hospitalized and d/c on Norvasc and lisinopril 40 mg only for bp.

## 2021-11-30 LAB — UPEP/UIFE/LIGHT CHAINS/TP, 24-HR UR
% BETA, Urine: 12.7 %
ALPHA 1 URINE: 4.4 %
Albumin, U: 44.7 %
Alpha 2, Urine: 8.7 %
Free Kappa Lt Chains,Ur: 67.12 mg/L (ref 1.17–86.46)
Free Kappa/Lambda Ratio: 9.47 (ref 1.83–14.26)
Free Lambda Lt Chains,Ur: 7.09 mg/L (ref 0.27–15.21)
GAMMA GLOBULIN URINE: 29.5 %
Total Protein, Urine-Ur/day: 127 mg/24 hr (ref 30–150)
Total Protein, Urine: 6.2 mg/dL
Total Volume: 2050

## 2021-12-02 ENCOUNTER — Ambulatory Visit (HOSPITAL_COMMUNITY)
Admission: RE | Admit: 2021-12-02 | Discharge: 2021-12-02 | Disposition: A | Discharge status: Home / Self Care | Payer: BC Managed Care – PPO | Source: Ambulatory Visit | Attending: Hematology and Oncology | Admitting: Hematology and Oncology

## 2021-12-02 ENCOUNTER — Other Ambulatory Visit: Payer: Self-pay

## 2021-12-02 DIAGNOSIS — D472 Monoclonal gammopathy: Secondary | ICD-10-CM | POA: Diagnosis not present

## 2021-12-05 ENCOUNTER — Other Ambulatory Visit: Payer: Self-pay | Admitting: Family Medicine

## 2021-12-05 DIAGNOSIS — E1151 Type 2 diabetes mellitus with diabetic peripheral angiopathy without gangrene: Secondary | ICD-10-CM

## 2021-12-05 DIAGNOSIS — M4726 Other spondylosis with radiculopathy, lumbar region: Secondary | ICD-10-CM

## 2021-12-05 NOTE — Telephone Encounter (Signed)
Last refilled 09/28/21, last OV in 05/2021.

## 2021-12-14 ENCOUNTER — Telehealth: Payer: Self-pay | Admitting: *Deleted

## 2021-12-14 NOTE — Telephone Encounter (Signed)
TCT patient regarding recent labs and bone survey.  No answer but was able to leave vm message for pt to return call to review labs and bone survey and to discuss bone marrow biopsy. Provided call back # of [phone]

## 2021-12-14 NOTE — Telephone Encounter (Signed)
-----  Message from Orson Slick, MD sent at 12/04/2021 12:48 PM EST ----- Please let Mr. Barthelemy know that his Bone Survey showed no lytic lesions or other concerning findings. His bloodwork does show high levels of abnormal protein- we have ordered a bone marrow biopsy to look into this further ( we discussed bone marrow biopsy at his last visit). We will schedule a follow up visit a few days after his bone marrow biopsy.   ----- Message ----- From: Interface, Rad Results In Sent: 12/04/2021   2:18 AM EST To: Orson Slick, MD

## 2021-12-16 ENCOUNTER — Telehealth: Payer: Self-pay

## 2021-12-16 NOTE — Telephone Encounter (Signed)
Opened in error

## 2021-12-16 NOTE — Telephone Encounter (Signed)
Opemed in error

## 2021-12-16 NOTE — Telephone Encounter (Signed)
Attempted to call Mr. Jarosz on both home and mobile number but was unsuccessful. Left VM on home phone requesting call back to Dr. Libby Maw nurse to relay the following information per Dr. Lorenso Courier:  Please let Mr. Egelston know that his Bone Survey showed no lytic lesions or other concerning findings. His bloodwork does show high levels of abnormal protein- we have ordered a bone marrow biopsy to look into this further ( we discussed bone marrow biopsy at his last visit). We will schedule a follow up visit a few days after his bone marrow biopsy.

## 2021-12-22 ENCOUNTER — Other Ambulatory Visit: Payer: Self-pay | Admitting: Family Medicine

## 2021-12-22 DIAGNOSIS — E1151 Type 2 diabetes mellitus with diabetic peripheral angiopathy without gangrene: Secondary | ICD-10-CM

## 2021-12-28 ENCOUNTER — Other Ambulatory Visit: Payer: Self-pay | Admitting: Radiology

## 2021-12-28 NOTE — H&P (Signed)
Chief Complaint: Patient was seen in consultation today for bone marrow biopsy with aspiration.   Referring Physician(s): Dorsey,John T IV  Supervising Physician: Aletta Edouard  Patient Status: [facility] - Out-pt  History of Present Illness: [name] is a 76 y.o. male with a medical history significant for DM2, CAD s/p CABG, carotid artery stenosis and HTN who was recently found to have IgA monoclonal gammopathy. [name] was evaluated by [facility] November 2022 and labs obtained showed M protein and light chain elevations consistent with IgA kappa monoclonal gammopathy. There is concern for multiple myeloma. Bone survey imaging 12/02/21 did not show any lesions.    Interventional Radiology has been asked to evaluate this patient for an image-guided bone marrow biopsy with aspiration for further work up.   Past Medical History:  Diagnosis Date   CAD, ARTERY BYPASS GRAFT 02/10/2008   CAROTID ARTERY STENOSIS, WITHOUT INFARCTION 09/22/2008   CORONARY ARTERY DISEASE 04/03/2008   Diabetes mellitus    "borderline"   ERECTILE DYSFUNCTION 01/14/2009   GOUT 01/16/2008   HERPES ZOSTER 04/15/2010   HYPERLIPIDEMIA 01/16/2008   HYPERTENSION 01/16/2008   IMPAIRED GLUCOSE TOLERANCE 08/22/2010   Shingles 2011   left chest   Ulcer 30 yrs ago   stomach    Past Surgical History:  Procedure Laterality Date   COLONOSCOPY     3 months ago   CORONARY ARTERY BYPASS GRAFT  2009   vessels x3   EAR CYST EXCISION N/A 07/29/2013   Procedure: excision of urachal cyst flexible cystoscopy insertion of foley cath;  Surgeon: Fredricka Bonine, MD;  Location: [facility] ORS;  Service: Urology;  Laterality: N/A;   LAPAROSCOPY N/A 07/29/2013   Procedure: LAPAROSCOPY DIAGNOSTIC  laparoscopic excision of urachal cyst ;  Surgeon: Madilyn Hook, DO;  Location: [facility];  Service: General;  Laterality: N/A;   LAPAROTOMY  08/25/2012   Procedure: EXPLORATORY LAPAROTOMY;  Surgeon: Madilyn Hook, DO;   Location: [facility];  Service: General;  Laterality: N/A;  incision and drainage abdominal wall abcessand intra abdominal wall abcess   PARTIAL GASTRECTOMY  1974   bleeding ulcers   urachal  2014   Urachal cyst removal    Allergies: Patient has no known allergies.  Medications: Prior to Admission medications   Medication Sig Start Date End Date Taking? Authorizing Provider  allopurinol (ZYLOPRIM) 100 MG tablet TAKE 1 TABLET BY MOUTH  DAILY Patient taking differently: Take 100 mg by mouth daily. 07/12/21   [name], MD  amLODipine (NORVASC) 10 MG tablet TAKE 1 TABLET BY MOUTH  DAILY Patient taking differently: Take 10 mg by mouth daily. 12/20/20   [name], MD  aspirin EC 81 MG EC tablet Take 1 tablet (81 mg total) by mouth daily. Swallow whole. 10/26/21 01/24/22  Darliss Cheney, MD  atorvastatin (LIPITOR) 80 MG tablet TAKE 1 TABLET BY MOUTH EVERY MORNING. Patient taking differently: Take 80 mg by mouth daily. 10/03/21   [name], MD  colchicine 0.6 MG tablet Take 2 tabs (1.26m) at fist sign of gout flare.  Then take 1 tab 1 hour later.  On day 2 take one tab daily until flare stops. 04/08/20   BBillie Ruddy MD  Continuous Blood Gluc Receiver (FREESTYLE LIBRE 14 DAY READER) DEVI Use as directed 04/08/20   BBillie Ruddy MD  Continuous Blood Gluc Sensor (FREESTYLE LIBRE SENSOR SYSTEM) MISC Apply device as directed every 2 weeks. 04/08/20   BBillie Ruddy MD  gabapentin (NEURONTIN)  100 MG capsule TAKE 1 CAPSULE BY MOUTH EVERYDAY AT BEDTIME Patient taking differently: 100 mg at bedtime as needed (pain). 02/17/21   [name], MD  glucose blood (ONE TOUCH ULTRA TEST) test strip 1 each by Other route daily. Use as instructed 08/29/12   Marletta Lor, MD  Lancets [facility] ULTRASOFT) lancets 1 each by Other route daily. Use as instructed 08/29/12   Marletta Lor, MD  lisinopril (ZESTRIL) 40 MG tablet Take 1 tablet (40 mg total) by mouth daily. 11/28/21    [name], MD  LOKELMA 10 g PACK packet Take 1 packet by mouth daily. 10/19/21   [provider]  nitroGLYCERIN (NITROSTAT) 0.4 MG SL tablet Place 1 tablet (0.4 mg total) under the tongue every 5 (five) minutes as needed for chest pain. 09/06/20   [name], MD  sildenafil (VIAGRA) 25 MG tablet Take 1 tablet (25 mg total) by mouth daily as needed for erectile dysfunction. 05/19/21   [name], MD  tamsulosin (FLOMAX) 0.4 MG CAPS capsule TAKE 2 CAPSULES BY MOUTH EVERY DAY Patient taking differently: 0.8 mg daily. 07/01/21   [name], MD  traMADol (ULTRAM) 50 MG tablet TAKE 2 TABLETS (100 MG TOTAL) BY MOUTH EVERY 12 (TWELVE) HOURS AS NEEDED. 12/07/21 01/06/22  [name], MD     Family History  Problem Relation Age of Onset   Heart disease Mother    Heart disease Sister    Colon cancer Neg Hx     Social History   Socioeconomic History   Marital status: Married    Spouse name: Not on file   Number of children: 3   Years of education: Not on file   Highest education level: Not on file  Occupational History    Employer: [employer]  Tobacco Use   Smoking status: Former    Packs/day: 1.00    Years: 10.00    Pack years: 10.00    Types: Cigarettes    Quit date: 07/27/1999    Years since quitting: 22.4   Smokeless tobacco: Never  Substance and Sexual Activity   Alcohol use: Yes    Comment: 4 beers a day   Drug use: No   Sexual activity: Not on file  Other Topics Concern   Not on file  Social History Narrative   epworth sleepiness scale score: 4   Social Determinants of Health   Financial Resource Strain: Low Risk    Difficulty of Paying Living Expenses: Not hard at all  Food Insecurity: No Food Insecurity   Worried About Charity fundraiser in the Last Year: Never true   Ran Out of Food in the Last Year: Never true  Transportation Needs: No Transportation Needs   Lack of Transportation (Medical): No   Lack of Transportation  (Non-Medical): No  Physical Activity: Inactive   Days of Exercise per Week: 0 days   Minutes of Exercise per Session: 0 min  Stress: No Stress Concern Present   Feeling of Stress : Not at all  Social Connections: Moderately Isolated   Frequency of Communication with Friends and Family: Twice a week   Frequency of Social Gatherings with Friends and Family: Twice a week   Attends Religious Services: Never   Marine scientist or Organizations: No   Attends Music therapist: Never   Marital Status: Married    Review of Systems: A 12 point ROS discussed and pertinent positives are indicated in  the HPI above.  All other systems are negative.  Review of Systems  Constitutional:  Negative for appetite change and fatigue.  Respiratory:  Negative for cough and shortness of breath.   Cardiovascular:  Negative for chest pain and leg swelling.  Gastrointestinal:  Negative for abdominal pain, diarrhea, nausea and vomiting.  Neurological:  Negative for dizziness and headaches.   Vital Signs: BP (!) 147/70    Pulse 81    Temp 98.2 F (36.8 C) (Oral)    Resp 18    Ht '5\' 10"'  (1.778 m)    Wt 278 lb (126.1 kg)    SpO2 97%    BMI 39.89 kg/m   Physical Exam Constitutional:      General: He is not in acute distress.    Appearance: He is obese. He is not ill-appearing.  HENT:     Mouth/Throat:     Mouth: Mucous membranes are moist.     Pharynx: Oropharynx is clear.  Cardiovascular:     Rate and Rhythm: Normal rate and regular rhythm.     Pulses: Normal pulses.     Heart sounds: Normal heart sounds.  Pulmonary:     Effort: Pulmonary effort is normal.     Breath sounds: Normal breath sounds.  Abdominal:     General: Bowel sounds are normal.     Palpations: Abdomen is soft.     Tenderness: There is no abdominal tenderness.     Comments: Gastrostomy tube intact - site covered in gauze which is clean and dry.   Musculoskeletal:     Right lower leg: Edema present.     Left  lower leg: Edema present.  Skin:    General: Skin is warm and dry.  Neurological:     Mental Status: He is alert and oriented to person, place, and time.    Imaging: DG Bone Survey Met  Result Date: 12/04/2021 CLINICAL DATA:  Monoclonal gammopathy.  Assess for lytic lesions. EXAM: METASTATIC BONE SURVEY COMPARISON:  Chest x-ray 10/23/2021, lumbar spine series 09/06/2020 FINDINGS: Single left lateral skull view is unremarkable. The patient is edentulous. No focal lesion is seen in the upper extremities and shoulder girdles, with bilateral osteophytes at the [facility] joints and patchy calcification again noted in the bilateral ulnar arteries. Views of the spine demonstrate osteopenia. There is degenerative disc disease and spondylosis in the mid to lower cervical spine and multilevel facet hypertrophy. In the thoracic spine there is extensive multilevel bridging enthesopathy of DI SH. Lumbar spine shows extensive bridging osteophytosis, vertebral body ankylosis L5-S1. No focal spinal lesion is suspected. The pelvis and sacrum are unremarkable apart from ankylosis across both SI joints. In the lower extremities no focal osseous lesion is seen. There is mild degenerative arthrosis at the knee joints and bilateral stranding edema in the forelegs and ankles, with moderate bilateral swelling at the ankles. There is also tubular calcific plaque in the bilateral femoral and popliteal trifurcation arteries consistent with diabetic atherosclerosis, with surgical clips in the medial upper right foreleg. Miscellaneous: Old CABG. Normal cardiac size and vascular pattern with clear lungs. The abdominal aorta is heavily calcified. Borderline infrarenal AAA 2.9 cm AP. No dilated small bowel is seen. No pathologic calcification at the level of the kidneys. IMPRESSION: 1. No lytic or blastic bone lesion on standard skeletal survey images. 2. Aortic atherosclerosis.  Borderline infrarenal AAA. 3. Extensive bridging bone  thoracolumbar spine. 4. Bilateral edema in the foreleg and ankles. 5. Vascular calcifications in both upper and  lower extremities. Electronically Signed   By: Telford Nab M.D.   On: 12/04/2021 02:16    Labs:  CBC: Recent Labs    10/23/21 0239 10/23/21 0250 10/23/21 0400 10/24/21 0315 11/23/21 1416 12/29/21 0733  WBC 6.8  --   --  5.0 6.0 5.5  HGB 10.2*   < > 10.2* 9.5* 11.0* 10.6*  HCT 33.6*   < > 30.0* 30.0* 34.0* 33.3*  PLT 178  --   --  187 222 213   < > = values in this interval not displayed.    COAGS: No results for input(s): INR, APTT in the last 8760 hours.  BMP: Recent Labs    10/24/21 0315 10/25/21 0338 10/25/21 1400 10/26/21 0447 11/23/21 1416  NA 134* 135  --  136 136  K 5.3* 5.4* 5.4* 5.3* 4.9  CL 115* 115*  --  113* 107  CO2 15* 15*  --  19* 25  GLUCOSE 169* 140*  --  147* 117*  BUN 48* 28*  --  20 18  CALCIUM 8.4* 8.7*  --  9.1 9.3  CREATININE 1.82* 1.30*  --  1.29* 1.31*  GFRNONAA 39* 58*  --  59* 57*    LIVER FUNCTION TESTS: Recent Labs    10/23/21 0239 10/25/21 0338 11/23/21 1416  BILITOT 0.6 0.8 0.7  AST '28 26 20  ' ALT '17 20 18  ' ALKPHOS 97 76 105  PROT 6.3* 6.2* 7.6  ALBUMIN 2.8* 2.6* 3.4*    TUMOR MARKERS: No results for input(s): AFPTM, CEA, CA199, CHROMGRNA in the last 8760 hours.  Assessment and Plan:  IgA monoclonal gammopathy; concern for multiple myeloma: [name], 76 year old male, presents today to the [place] [facility] department for an image-guided bone marrow biopsy with aspiration.  Risks and benefits of this procedure were discussed with the patient and/or patient's family including, but not limited to bleeding, infection, damage to adjacent structures or low yield requiring additional tests.  All of the questions were answered and there is agreement to proceed. He has been NPO.   Consent signed and in chart.  Thank you for this interesting consult.  I greatly enjoyed meeting [name] and look forward to participating in their care.  A copy of this report was sent to the requesting provider on this date.  Electronically Signed: Soyla Dryer, AGACNP-BC [phone] 12/29/2021, 8:07 AM   I spent a total of  30 Minutes   in face to face in clinical consultation, greater than 50% of which was counseling/coordinating care for bone marrow biopsy with aspiration.

## 2021-12-29 ENCOUNTER — Ambulatory Visit (HOSPITAL_COMMUNITY)
Admission: RE | Admit: 2021-12-29 | Discharge: 2021-12-29 | Disposition: A | Discharge status: Home / Self Care | Payer: BC Managed Care – PPO | Source: Ambulatory Visit | Attending: Hematology and Oncology | Admitting: Hematology and Oncology

## 2021-12-29 ENCOUNTER — Other Ambulatory Visit: Payer: Self-pay

## 2021-12-29 ENCOUNTER — Encounter (HOSPITAL_COMMUNITY): Payer: Self-pay

## 2021-12-29 DIAGNOSIS — I700 Atherosclerosis of aorta: Secondary | ICD-10-CM | POA: Diagnosis not present

## 2021-12-29 DIAGNOSIS — Z951 Presence of aortocoronary bypass graft: Secondary | ICD-10-CM | POA: Diagnosis not present

## 2021-12-29 DIAGNOSIS — E119 Type 2 diabetes mellitus without complications: Secondary | ICD-10-CM | POA: Diagnosis not present

## 2021-12-29 DIAGNOSIS — Q939 Deletion from autosomes, unspecified: Secondary | ICD-10-CM | POA: Diagnosis not present

## 2021-12-29 DIAGNOSIS — I708 Atherosclerosis of other arteries: Secondary | ICD-10-CM | POA: Diagnosis not present

## 2021-12-29 DIAGNOSIS — D472 Monoclonal gammopathy: Secondary | ICD-10-CM | POA: Insufficient documentation

## 2021-12-29 DIAGNOSIS — R898 Other abnormal findings in specimens from other organs, systems and tissues: Secondary | ICD-10-CM | POA: Insufficient documentation

## 2021-12-29 DIAGNOSIS — D649 Anemia, unspecified: Secondary | ICD-10-CM | POA: Diagnosis not present

## 2021-12-29 DIAGNOSIS — I10 Essential (primary) hypertension: Secondary | ICD-10-CM | POA: Diagnosis not present

## 2021-12-29 DIAGNOSIS — R600 Localized edema: Secondary | ICD-10-CM | POA: Insufficient documentation

## 2021-12-29 DIAGNOSIS — D72822 Plasmacytosis: Secondary | ICD-10-CM | POA: Insufficient documentation

## 2021-12-29 DIAGNOSIS — I7143 Infrarenal abdominal aortic aneurysm, without rupture: Secondary | ICD-10-CM | POA: Insufficient documentation

## 2021-12-29 DIAGNOSIS — I6529 Occlusion and stenosis of unspecified carotid artery: Secondary | ICD-10-CM | POA: Insufficient documentation

## 2021-12-29 DIAGNOSIS — I2510 Atherosclerotic heart disease of native coronary artery without angina pectoris: Secondary | ICD-10-CM | POA: Insufficient documentation

## 2021-12-29 LAB — CBC WITH DIFFERENTIAL/PLATELET
Abs Immature Granulocytes: 0.02 10*3/uL (ref 0.00–0.07)
Basophils Absolute: 0 10*3/uL (ref 0.0–0.1)
Basophils Relative: 1 %
Eosinophils Absolute: 0.2 10*3/uL (ref 0.0–0.5)
Eosinophils Relative: 4 %
HCT: 33.3 % — ABNORMAL LOW (ref 39.0–52.0)
Hemoglobin: 10.6 g/dL — ABNORMAL LOW (ref 13.0–17.0)
Immature Granulocytes: 0 %
Lymphocytes Relative: 34 %
Lymphs Abs: 1.9 10*3/uL (ref 0.7–4.0)
MCH: 31.4 pg (ref 26.0–34.0)
MCHC: 31.8 g/dL (ref 30.0–36.0)
MCV: 98.5 fL (ref 80.0–100.0)
Monocytes Absolute: 0.7 10*3/uL (ref 0.1–1.0)
Monocytes Relative: 12 %
Neutro Abs: 2.7 10*3/uL (ref 1.7–7.7)
Neutrophils Relative %: 49 %
Platelets: 213 10*3/uL (ref 150–400)
RBC: 3.38 MIL/uL — ABNORMAL LOW (ref 4.22–5.81)
RDW: 15.4 % (ref 11.5–15.5)
WBC: 5.5 10*3/uL (ref 4.0–10.5)
nRBC: 0 % (ref 0.0–0.2)

## 2021-12-29 LAB — GLUCOSE, CAPILLARY: Glucose-Capillary: 142 mg/dL — ABNORMAL HIGH (ref 70–99)

## 2021-12-29 MED ORDER — SODIUM CHLORIDE 0.9 % IV SOLN: INTRAVENOUS | Status: DC

## 2021-12-29 MED ORDER — FENTANYL CITRATE (PF) 100 MCG/2ML IJ SOLN
INTRAMUSCULAR | Status: AC | PRN
  Administered 2021-12-29 (×2): 50 ug via INTRAVENOUS

## 2021-12-29 MED ORDER — MIDAZOLAM HCL 2 MG/2ML IJ SOLN
INTRAMUSCULAR | Status: AC
  Filled 2021-12-29: qty 4

## 2021-12-29 MED ORDER — FENTANYL CITRATE (PF) 100 MCG/2ML IJ SOLN
INTRAMUSCULAR | Status: AC
  Filled 2021-12-29: qty 2

## 2021-12-29 MED ORDER — LIDOCAINE HCL (PF) 1 % IJ SOLN
INTRAMUSCULAR | Status: AC | PRN
  Administered 2021-12-29: 10 mL

## 2021-12-29 MED ORDER — MIDAZOLAM HCL 2 MG/2ML IJ SOLN
INTRAMUSCULAR | Status: AC | PRN
  Administered 2021-12-29 (×3): 1 mg via INTRAVENOUS

## 2021-12-29 NOTE — Procedures (Signed)
Interventional Radiology Procedure Note  Procedure: CT guided bone marrow aspiration and biopsy  Complications: None  EBL: < 10 mL  Findings: Aspirate and core biopsy performed of bone marrow in right iliac bone.  Plan: Bedrest supine x 1 hrs  Tess Potts T. Fardeen Steinberger, M.D Pager:  [phone]   

## 2021-12-29 NOTE — Discharge Instructions (Signed)
Interventional radiology phone numbers ?[phone] ?After hours [phone] ? ?Bone Marrow Aspiration and Bone Marrow Biopsy, Adult, Care After ?This sheet gives you information about how to care for yourself after your procedure. Your health care provider may also give you more specific instructions. If you have problems or questions, contact your health care provider. ?What can I expect after the procedure? ?After the procedure, it is common to have: ?Mild pain and tenderness. ?Swelling. ?Bruising. ?Follow these instructions at home: ?Puncture site care ?Follow instructions from your health care provider about how to take care of the puncture site. Make sure you: ?Wash your hands with soap and water before and after you change your bandage (dressing). If soap and water are not available, use hand sanitizer. ? ?Check your puncture site every day for signs of infection. Check for: ?More redness, swelling, or pain. ?Fluid or blood. ?Warmth. ?Pus or a bad smell.   ?Activity ?Return to your normal activities as told by your health care provider. Ask your health care provider what activities are safe for you. ?Do not lift anything that is heavier than 10 lb (4.5 kg), or the limit that you are told, until your health care provider says that it is safe. ?Do not drive for 24 hours if you were given a sedative during your procedure. ?General instructions ?Take over-the-counter and prescription medicines only as told by your health care provider. ?Do not take baths, swim, or use a hot tub until your health care provider approves. You may remove your dressing tomorrow and shower 24 hours after your biopsy. ?Put ice on the affected area. To do this: ?Put ice in a plastic bag. ?Place a towel between your skin and the bag. ?Leave the ice on for 20 minutes, 2-3 times a day. ?Keep all follow-up visits as told by your health care provider. This is important.   ?Contact a health care provider if: ?Your pain is not controlled with  medicine. ?You have a fever. ?You have more redness, swelling, or pain around the puncture site. ?You have fluid or blood coming from the puncture site. ?Your puncture site feels warm to the touch. ?You have pus or a bad smell coming from the puncture site. ?Summary ?After the procedure, it is common to have mild pain, tenderness, swelling, and bruising. ?Follow instructions from your health care provider about how to take care of the puncture site and what activities are safe for you. ?Take over-the-counter and prescription medicines only as told by your health care provider. ?Contact a health care provider if you have any signs of infection, such as fluid or blood coming from the puncture site. ?This information is not intended to replace advice given to you by your health care provider. Make sure you discuss any questions you have with your health care provider. ?Document Revised: 03/11/2019 Document Reviewed: 03/11/2019 ?Elsevier Patient Education ? 2021 Else[employer] ? ? ? ?Moderate Conscious Sedation, Adult, Care After ?This sheet gives you information about how to care for yourself after your procedure. Your health care provider may also give you more specific instructions. If you have problems or questions, contact your health care provider. ?What can I expect after the procedure? ?After the procedure, it is common to have: ?Sleepiness for several hours. ?Impaired judgment for several hours. ?Difficulty with balance. ?Vomiting if you eat too soon. ?Follow these instructions at home: ?For the time period you were told by your health care provider: ?Rest. ?Do not participate in activities where you could fall or   become injured. ?Do not drive or use machinery. ?Do not drink alcohol. ?Do not take sleeping pills or medicines that cause drowsiness. ?Do not make important decisions or sign legal documents. ?Do not take care of children on your own.  ?  ?  ?Eating and drinking ?Follow the diet recommended by your  health care provider. ?Drink enough fluid to keep your urine pale yellow. ?If you vomit: ?Drink water, juice, or soup when you can drink without vomiting. ?Make sure you have little or no nausea before eating solid foods.   ?General instructions ?Take over-the-counter and prescription medicines only as told by your health care provider. ?Have a responsible adult stay with you for the time you are told. It is important to have someone help care for you until you are awake and alert. ?Do not smoke. ?Keep all follow-up visits as told by your health care provider. This is important. ?Contact a health care provider if: ?You are still sleepy or having trouble with balance after 24 hours. ?You feel light-headed. ?You keep feeling nauseous or you keep vomiting. ?You develop a rash. ?You have a fever. ?You have redness or swelling around the IV site. ?Get help right away if: ?You have trouble breathing. ?You have new-onset confusion at home. ?Summary ?After the procedure, it is common to feel sleepy, have impaired judgment, or feel nauseous if you eat too soon. ?Rest after you get home. Know the things you should not do after the procedure. ?Follow the diet recommended by your health care provider and drink enough fluid to keep your urine pale yellow. ?Get help right away if you have trouble breathing or new-onset confusion at home. ?This information is not intended to replace advice given to you by your health care provider. Make sure you discuss any questions you have with your health care provider. ?Document Revised: 02/20/2020 Document Reviewed: 09/18/2019 ?Elsevier Patient Education ? 2021 Else[employer]  ?

## 2022-01-02 ENCOUNTER — Telehealth: Payer: Self-pay | Admitting: Hematology and Oncology

## 2022-01-02 LAB — SURGICAL PATHOLOGY

## 2022-01-02 NOTE — Telephone Encounter (Signed)
Sch per 2/27 inbasket, left msg with pt

## 2022-01-04 ENCOUNTER — Encounter (HOSPITAL_COMMUNITY): Payer: Self-pay | Admitting: Hematology and Oncology

## 2022-01-09 ENCOUNTER — Encounter (HOSPITAL_COMMUNITY): Payer: Self-pay | Admitting: Hematology and Oncology

## 2022-01-12 ENCOUNTER — Inpatient Hospital Stay: Payer: BC Managed Care – PPO

## 2022-01-12 ENCOUNTER — Inpatient Hospital Stay: Payer: BC Managed Care – PPO | Attending: Hematology and Oncology | Admitting: Hematology and Oncology

## 2022-01-12 ENCOUNTER — Other Ambulatory Visit: Payer: Self-pay

## 2022-01-12 VITALS — BP 176/75 | HR 86 | Temp 97.5°F | Resp 17 | Wt 282.6 lb

## 2022-01-12 DIAGNOSIS — Z7982 Long term (current) use of aspirin: Secondary | ICD-10-CM | POA: Diagnosis not present

## 2022-01-12 DIAGNOSIS — D472 Monoclonal gammopathy: Secondary | ICD-10-CM | POA: Insufficient documentation

## 2022-01-12 DIAGNOSIS — E785 Hyperlipidemia, unspecified: Secondary | ICD-10-CM | POA: Insufficient documentation

## 2022-01-12 DIAGNOSIS — I2510 Atherosclerotic heart disease of native coronary artery without angina pectoris: Secondary | ICD-10-CM | POA: Insufficient documentation

## 2022-01-12 DIAGNOSIS — Z87891 Personal history of nicotine dependence: Secondary | ICD-10-CM | POA: Diagnosis not present

## 2022-01-12 DIAGNOSIS — Z8711 Personal history of peptic ulcer disease: Secondary | ICD-10-CM | POA: Diagnosis not present

## 2022-01-12 DIAGNOSIS — E119 Type 2 diabetes mellitus without complications: Secondary | ICD-10-CM | POA: Diagnosis not present

## 2022-01-12 DIAGNOSIS — Z79899 Other long term (current) drug therapy: Secondary | ICD-10-CM | POA: Diagnosis not present

## 2022-01-12 DIAGNOSIS — I10 Essential (primary) hypertension: Secondary | ICD-10-CM | POA: Insufficient documentation

## 2022-01-12 DIAGNOSIS — M109 Gout, unspecified: Secondary | ICD-10-CM | POA: Insufficient documentation

## 2022-01-12 NOTE — Progress Notes (Signed)
[place] [phone]   [phone]) [phone]  PROGRESS NOTE  Patient Care Team: [name], MD as PCP - General (Family Medicine) [name], [place] as Referring Physician (Optometry)  Hematological/Oncological History # IgA kappa Monoclonal Gammopathy  10/05/2021: evaluated by [facility]. Found to have M protein 0.6, Kappa 130, Lambda 40.3, Ratio 3.25. IFE shows an IgA Kappa monoclonal protein. Cr 2.00 11/23/2021: establish care with Dr. Lorenso Courier  12/29/2021: Bone marrow biopsy performed showed hypercellular bone marrow for age with light plasmacytosis (5%)  Interval History:  [name] 76 y.o. male with medical history significant for an IgA kappa monoclonal gammopathy who presents for a follow up visit. The patient's last visit was on 11/23/2021 at which time he established care. In the interim since the last visit he has completed a bone marrow biopsy which showed findings consistent with MGUS.  On exam today [name] notes that the bone marrow biopsy procedure went well.  He notes he had a little bit of bruising and some soreness afterwards but now he is completely healed.  He notes he is had no other major changes in his health in the interim since her last visit.  His energy levels have been good and his appetite has been strong.  He denies any shortness of breath or chest pain.  Overall he is at his baseline level of health.  A full 10 point ROS is listed below.  The bulk of our discussion focused on the results of the bone marrow biopsy and the steps moving forward.  His findings are most consistent with MGUS.  We discussed what to expect with this condition and the follow-up that we recommend.  He voices understanding of the plan moving forward.  MEDICAL HISTORY:  Past Medical History:  Diagnosis Date   CAD, ARTERY BYPASS GRAFT 02/10/2008   CAROTID ARTERY STENOSIS, WITHOUT INFARCTION 09/22/2008   CORONARY ARTERY DISEASE  04/03/2008   Diabetes mellitus    "borderline"   ERECTILE DYSFUNCTION 01/14/2009   GOUT 01/16/2008   HERPES ZOSTER 04/15/2010   HYPERLIPIDEMIA 01/16/2008   HYPERTENSION 01/16/2008   IMPAIRED GLUCOSE TOLERANCE 08/22/2010   Shingles 2011   left chest   Ulcer 30 yrs ago   stomach    SURGICAL HISTORY: Past Surgical History:  Procedure Laterality Date   COLONOSCOPY     3 months ago   CORONARY ARTERY BYPASS GRAFT  2009   vessels x3   EAR CYST EXCISION N/A 07/29/2013   Procedure: excision of urachal cyst flexible cystoscopy insertion of foley cath;  Surgeon: Fredricka Bonine, MD;  Location: [facility];  Service: Urology;  Laterality: N/A;   LAPAROSCOPY N/A 07/29/2013   Procedure: LAPAROSCOPY DIAGNOSTIC  laparoscopic excision of urachal cyst ;  Surgeon: Madilyn Hook, DO;  Location: [facility];  Service: General;  Laterality: N/A;   LAPAROTOMY  08/25/2012   Procedure: EXPLORATORY LAPAROTOMY;  Surgeon: Madilyn Hook, DO;  Location: [facility];  Service: General;  Laterality: N/A;  incision and drainage abdominal wall abcessand intra abdominal wall abcess   PARTIAL GASTRECTOMY  1974   bleeding ulcers   urachal  2014   Urachal cyst removal    SOCIAL HISTORY: Social History   Socioeconomic History   Marital status: Married    Spouse name: Not on file   Number of children: 3   Years of education: Not on file   Highest education level: Not on file  Occupational History    Employer: [employer]  Tobacco Use   Smoking status: Former    Packs/day: 1.00    Years: 10.00    Pack years: 10.00    Types: Cigarettes    Quit date: 07/27/1999    Years since quitting: 22.4   Smokeless tobacco: Never  Substance and Sexual Activity   Alcohol use: Yes    Comment: 4 beers a day   Drug use: No   Sexual activity: Not on file  Other Topics Concern   Not on file  Social History Narrative   epworth sleepiness scale score: 4   Social Determinants of Health   Financial Resource Strain: Low Risk     Difficulty of Paying Living Expenses: Not hard at all  Food Insecurity: No Food Insecurity   Worried About Charity fundraiser in the Last Year: Never true   Ran Out of Food in the Last Year: Never true  Transportation Needs: No Transportation Needs   Lack of Transportation (Medical): No   Lack of Transportation (Non-Medical): No  Physical Activity: Inactive   Days of Exercise per Week: 0 days   Minutes of Exercise per Session: 0 min  Stress: No Stress Concern Present   Feeling of Stress : Not at all  Social Connections: Moderately Isolated   Frequency of Communication with Friends and Family: Twice a week   Frequency of Social Gatherings with Friends and Family: Twice a week   Attends Religious Services: Never   Marine scientist or Organizations: No   Attends Music therapist: Never   Marital Status: Married  Human resources officer Violence: Not At Risk   Fear of Current or Ex-Partner: No   Emotionally Abused: No   Physically Abused: No   Sexually Abused: No    FAMILY HISTORY: Family History  Problem Relation Age of Onset   Heart disease Mother    Heart disease Sister    Colon cancer Neg Hx     ALLERGIES:  has No Known Allergies.  MEDICATIONS:  Current Outpatient Medications  Medication Sig Dispense Refill   allopurinol (ZYLOPRIM) 100 MG tablet TAKE 1 TABLET BY MOUTH  DAILY (Patient taking differently: Take 100 mg by mouth daily.) 90 tablet 3   amLODipine (NORVASC) 10 MG tablet TAKE 1 TABLET BY MOUTH  DAILY (Patient taking differently: Take 10 mg by mouth daily.) 90 tablet 3   aspirin EC 81 MG EC tablet Take 1 tablet (81 mg total) by mouth daily. Swallow whole. 30 tablet 2   atorvastatin (LIPITOR) 80 MG tablet TAKE 1 TABLET BY MOUTH EVERY MORNING. (Patient taking differently: Take 80 mg by mouth daily.) 90 tablet 3   colchicine 0.6 MG tablet Take 2 tabs (1.81m) at fist sign of gout flare.  Then take 1 tab 1 hour later.  On day 2 take one tab daily until flare  stops. 20 tablet 3   Continuous Blood Gluc Receiver (FREESTYLE LIBRE 14 DAY READER) DEVI Use as directed 1 each 0   Continuous Blood Gluc Sensor (FREESTYLE LIBRE SENSOR SYSTEM) MISC Apply device as directed every 2 weeks. 2 each 11   gabapentin (NEURONTIN) 100 MG capsule TAKE 1 CAPSULE BY MOUTH EVERYDAY AT BEDTIME (Patient taking differently: 100 mg at bedtime as needed (pain).) 90 capsule 1   glucose blood (ONE TOUCH ULTRA TEST) test strip 1 each by Other route daily. Use as instructed 100 each 12   Lancets (ONETOUCH ULTRASOFT) lancets 1 each by Other route daily. Use as instructed 100 each 12   lisinopril (  ZESTRIL) 40 MG tablet Take 1 tablet (40 mg total) by mouth daily. 90 tablet 2   LOKELMA 10 g PACK packet Take 1 packet by mouth daily.     nitroGLYCERIN (NITROSTAT) 0.4 MG SL tablet Place 1 tablet (0.4 mg total) under the tongue every 5 (five) minutes as needed for chest pain. 15 tablet 5   sildenafil (VIAGRA) 25 MG tablet Take 1 tablet (25 mg total) by mouth daily as needed for erectile dysfunction. 10 tablet 1   tamsulosin (FLOMAX) 0.4 MG CAPS capsule TAKE 2 CAPSULES BY MOUTH EVERY DAY (Patient taking differently: 0.8 mg daily.) 180 capsule 1   No current facility-administered medications for this visit.    REVIEW OF SYSTEMS:   Constitutional: ( - ) fevers, ( - )  chills , ( - ) night sweats Eyes: ( - ) blurriness of vision, ( - ) double vision, ( - ) watery eyes Ears, nose, mouth, throat, and face: ( - ) mucositis, ( - ) sore throat Respiratory: ( - ) cough, ( - ) dyspnea, ( - ) wheezes Cardiovascular: ( - ) palpitation, ( - ) chest discomfort, ( - ) lower extremity swelling Gastrointestinal:  ( - ) nausea, ( - ) heartburn, ( - ) change in bowel habits Skin: ( - ) abnormal skin rashes Lymphatics: ( - ) new lymphadenopathy, ( - ) easy bruising Neurological: ( - ) numbness, ( - ) tingling, ( - ) new weaknesses Behavioral/Psych: ( - ) mood change, ( - ) new changes  All other systems  were reviewed with the patient and are negative.  PHYSICAL EXAMINATION:  Vitals:   01/12/22 1241  BP: (!) 176/75  Pulse: 86  Resp: 17  Temp: (!) 97.5 F (36.4 C)  SpO2: 98%   Filed Weights   01/12/22 1241  Weight: 282 lb 9.6 oz (128.2 kg)    GENERAL: Well-appearing elderly male, alert, no distress and comfortable SKIN: skin color, texture, turgor are normal, no rashes or significant lesions EYES: conjunctiva are pink and non-injected, sclera clear LUNGS: clear to auscultation and percussion with normal breathing effort HEART: regular rate & rhythm and no murmurs and no lower extremity edema Musculoskeletal: no cyanosis of digits and no clubbing  PSYCH: alert & oriented x 3, fluent speech NEURO: no focal motor/sensory deficits  LABORATORY DATA:  I have reviewed the data as listed CBC Latest Ref Rng & Units 12/29/2021 11/23/2021 10/24/2021  WBC 4.0 - 10.5 K/uL 5.5 6.0 5.0  Hemoglobin 13.0 - 17.0 g/dL 10.6(L) 11.0(L) 9.5(L)  Hematocrit 39.0 - 52.0 % 33.3(L) 34.0(L) 30.0(L)  Platelets 150 - 400 K/uL 213 222 187    CMP Latest Ref Rng & Units 11/23/2021 10/26/2021 10/25/2021  Glucose 70 - 99 mg/dL 117(H) 147(H) -  BUN 8 - 23 mg/dL 18 20 -  Creatinine 0.61 - 1.24 mg/dL 1.31(H) 1.29(H) -  Sodium 135 - 145 mmol/L 136 136 -  Potassium 3.5 - 5.1 mmol/L 4.9 5.3(H) 5.4(H)  Chloride 98 - 111 mmol/L 107 113(H) -  CO2 22 - 32 mmol/L 25 19(L) -  Calcium 8.9 - 10.3 mg/dL 9.3 9.1 -  Total Protein 6.5 - 8.1 g/dL 7.6 - -  Total Bilirubin 0.3 - 1.2 mg/dL 0.7 - -  Alkaline Phos 38 - 126 U/L 105 - -  AST 15 - 41 U/L 20 - -  ALT 0 - 44 U/L 18 - -    Lab Results  Component Value Date   MPROTEIN 0.8 (H) 11/23/2021  Lab Results  Component Value Date   KPAFRELGTCHN 96.0 (H) 11/23/2021   LAMBDASER 33.0 (H) 11/23/2021   KAPLAMBRATIO 9.47 11/28/2021   KAPLAMBRATIO 2.91 (H) 11/23/2021    RADIOGRAPHIC STUDIES: CT BIOPSY  Result Date: January 02, 2022 CLINICAL DATA:  IgA cap of monoclonal  gammopathy and workup for possible multiple myeloma. EXAM: CT GUIDED BONE MARROW ASPIRATION AND BIOPSY ANESTHESIA/SEDATION: Moderate (conscious) sedation was employed during this procedure. A total of Versed 3.0 mg and Fentanyl 100 mcg was administered intravenously by radiology nursing. Moderate Sedation Time: 12 minutes. The patient's level of consciousness and vital signs were monitored continuously by radiology nursing throughout the procedure under my direct supervision. PROCEDURE: The procedure risks, benefits, and alternatives were explained to the patient. Questions regarding the procedure were encouraged and answered. The patient understands and consents to the procedure. A time out was performed prior to initiating the procedure. The right gluteal region was prepped with chlorhexidine. Sterile gown and sterile gloves were used for the procedure. Local anesthesia was provided with 1% Lidocaine. Under CT guidance, an 11 gauge On Control bone cutting needle was advanced from a posterior approach into the right iliac bone. Needle positioning was confirmed with CT. Initial non heparinized and heparinized aspirate samples were obtained of bone marrow. Core biopsy was performed via the On Control drill needle. COMPLICATIONS: None FINDINGS: Inspection of initial aspirate did reveal visible particles. Intact core biopsy sample was obtained. IMPRESSION: CT guided bone marrow biopsy of right posterior iliac bone with both aspirate and core samples obtained. Electronically Signed   By: Aletta Edouard M.D.   On: January 02, 2022 10:57   CT BONE MARROW BIOPSY & ASPIRATION  Result Date: 01/02/22 CLINICAL DATA:  IgA cap of monoclonal gammopathy and workup for possible multiple myeloma. EXAM: CT GUIDED BONE MARROW ASPIRATION AND BIOPSY ANESTHESIA/SEDATION: Moderate (conscious) sedation was employed during this procedure. A total of Versed 3.0 mg and Fentanyl 100 mcg was administered intravenously by radiology nursing.  Moderate Sedation Time: 12 minutes. The patient's level of consciousness and vital signs were monitored continuously by radiology nursing throughout the procedure under my direct supervision. PROCEDURE: The procedure risks, benefits, and alternatives were explained to the patient. Questions regarding the procedure were encouraged and answered. The patient understands and consents to the procedure. A time out was performed prior to initiating the procedure. The right gluteal region was prepped with chlorhexidine. Sterile gown and sterile gloves were used for the procedure. Local anesthesia was provided with 1% Lidocaine. Under CT guidance, an 11 gauge On Control bone cutting needle was advanced from a posterior approach into the right iliac bone. Needle positioning was confirmed with CT. Initial non heparinized and heparinized aspirate samples were obtained of bone marrow. Core biopsy was performed via the On Control drill needle. COMPLICATIONS: None FINDINGS: Inspection of initial aspirate did reveal visible particles. Intact core biopsy sample was obtained. IMPRESSION: CT guided bone marrow biopsy of right posterior iliac bone with both aspirate and core samples obtained. Electronically Signed   By: Aletta Edouard M.D.   On: 2022-01-02 10:57    ASSESSMENT & PLAN [name] 76 y.o. male with medical history significant for an IgA kappa monoclonal gammopathy who presents for a follow up visit.   Monoclonal Gammopathies are a group of medical conditions defined by the presence of a monoclonal protein (an M protein) in the blood or urine. Monoclonal gammopathies include monoclonal gammopathy of unknown significance (MGUS), Monoclonal gammopathies of renal or neurological significance, smolder multiple myeloma (SMM), multiple  myeloma (MM), AL amyloidosis, and Waldenstrom macroglobulinemia. The goal of the initial workup is to determine which monoclonal gammopathy a patient has. The workup consists of  evaluating protein in the serum (with serum protein electrophoresis (SPEP) and serum free light chains) , evaluating protein in the urine (UPEP), and evaluation of the skeleton (DG Bone Met Survey) to assure no lytic lesions. Baseline bloodwork includes CMP and CBC. If no CRAB criteria or high risk criteria are noted then the diagnosis is MGUS. MGUS must be followed with bloodwork periodically to assure it does not convert to multiple myeloma (occurs to approximately 1% of patients per year). If there are CRAB criteria or high risk features (such as elevated serum free light chain ratio (taking into account renal function), a non IgG M protein, or M protein >1.5) then a bone marrow biopsy must be pursued.  # IgA kappa Monoclonal Gammopathy of Undetermined Significance -- Initial findings concerning for a known IgG monoclonal gammopathy with chronic kidney disease.   --Bone marrow biopsy on 12/29/2021 showed 5% plasmacytosis with no evidence of a plasma cell neoplasm Plan:  -- At each visit will order an SPEP, SFLC, CBC, CMP, and LDH --recommend a metastatic bone survey to assess for lytic lesions and UPEP at least once yearly -- Return to clinic in 6 months time for further evaluation   No orders of the defined types were placed in this encounter.   All questions were answered. The patient knows to call the clinic with any problems, questions or concerns.  A total of more than 30 minutes were spent on this encounter with face-to-face time and non-face-to-face time, including preparing to see the patient, ordering tests and/or medications, counseling the patient and coordination of care as outlined above.   Ledell Peoples, MD Department of Hematology/Oncology [facility] Phone: [phone] Pager: [phone] Email: [email or web address]  01/12/2022 1:57 PM   Literature Support:  1) Kyle RA, Durie BG, Rajkumar SV, et al. Monoclonal gammopathy of  undetermined significance (MGUS) and smoldering (asymptomatic) multiple myeloma: IMWG consensus perspectives risk factors for progression and guidelines for monitoring and management. Leukemia. 2010;24(6):1121-1127. doi:10.1038/leu.2010.60   --If a patient with apparent MGUS has a serum monoclonal protein >15 g/l, IgA or IgM protein type, or an abnormal FLC ratio, a BM aspirate and biopsy should be carried out at baseline to rule out underlying PC malignancy.   2) Marylyn Ishihara RA, Therneau TM, Rajkumar SV, et al. Prevalence of monoclonal gammopathy of undetermined significance. N Engl J Med [id number];850:2774-1287   --MGUS was found in 3.2 percent of persons 78 years of age or older and 5.3 percent of persons 36 years of age or older.

## 2022-01-18 ENCOUNTER — Other Ambulatory Visit: Payer: Self-pay | Admitting: Family Medicine

## 2022-01-20 ENCOUNTER — Other Ambulatory Visit: Payer: Self-pay | Admitting: Family Medicine

## 2022-01-20 DIAGNOSIS — M4726 Other spondylosis with radiculopathy, lumbar region: Secondary | ICD-10-CM

## 2022-01-25 ENCOUNTER — Encounter: Payer: Self-pay | Admitting: Family Medicine

## 2022-01-25 ENCOUNTER — Ambulatory Visit (INDEPENDENT_AMBULATORY_CARE_PROVIDER_SITE_OTHER): Payer: BC Managed Care – PPO | Admitting: Family Medicine

## 2022-01-25 VITALS — BP 152/80 | HR 87 | Temp 98.6°F | Wt 282.6 lb

## 2022-01-25 DIAGNOSIS — E1121 Type 2 diabetes mellitus with diabetic nephropathy: Secondary | ICD-10-CM

## 2022-01-25 DIAGNOSIS — I10 Essential (primary) hypertension: Secondary | ICD-10-CM | POA: Diagnosis not present

## 2022-01-25 DIAGNOSIS — E1151 Type 2 diabetes mellitus with diabetic peripheral angiopathy without gangrene: Secondary | ICD-10-CM

## 2022-01-25 DIAGNOSIS — D472 Monoclonal gammopathy: Secondary | ICD-10-CM

## 2022-01-25 DIAGNOSIS — N1831 Chronic kidney disease, stage 3a: Secondary | ICD-10-CM | POA: Diagnosis not present

## 2022-01-25 MED ORDER — METOPROLOL TARTRATE 25 MG PO TABS: 12.5000 mg | ORAL_TABLET | Freq: Two times a day (BID) | ORAL | 3 refills | Status: DC

## 2022-01-25 MED ORDER — FREESTYLE LIBRE SENSOR SYSTEM MISC: 11 refills | Status: DC

## 2022-01-25 NOTE — Patient Instructions (Addendum)
Do not forget to schedule your yearly eye exam ? ?Your hemoglobin A1c was 5.8% on 10/24/2021. ? ? ?A prescription for metoprolol tartrate 25 mg was sent to your pharmacy.  You are to take half tab twice a day along with your other blood pressure medications.  Get a blood pressure cuff and monitor your blood pressure at home so you can bring a log of your readings to your next visit. ?

## 2022-01-25 NOTE — Progress Notes (Signed)
Subjective:  ? ? Patient ID: [name], male    DOB: October 24, 1946, 76 y.o.   MRN: [id number] ? ?Chief Complaint  ?Patient presents with  ? Follow-up  ?  BP check -home readings have been a little high  ? ? ?HPI ?Patient was seen today for BP.  Patient states BP is good elevated systolic reading was 291.  Patient denies headaches, CP, changes in vision.  Currently taking lisinopril 40 mg daily, Norvasc 10 mg daily.  Patient trying to drink more water.  Previously on prior to hospitalization in December 2022 for AKI.  Followed by nephrology for chronic kidney disease stage IIIa.  Had follow-up with heme-onc for MUGS on 01/12/2022.  States blood sugar at home typically around 120s.  Denies hypo or hyperglycemia.  Hemoglobin A1c 5.8% on 10/24/2021.  Eye exam to be scheduled in the next few months.  Patient states he feels better overall.  Considering retiring soon.  Wants to get more active/go to the gym. ? ?Past Medical History:  ?Diagnosis Date  ? CAD, ARTERY BYPASS GRAFT 02/10/2008  ? CAROTID ARTERY STENOSIS, WITHOUT INFARCTION 09/22/2008  ? CORONARY ARTERY DISEASE 04/03/2008  ? Diabetes mellitus   ? "borderline"  ? ERECTILE DYSFUNCTION 01/14/2009  ? GOUT 01/16/2008  ? HERPES ZOSTER 04/15/2010  ? HYPERLIPIDEMIA 01/16/2008  ? HYPERTENSION 01/16/2008  ? IMPAIRED GLUCOSE TOLERANCE 08/22/2010  ? Shingles 2011  ? left chest  ? Ulcer 30 yrs ago  ? stomach  ? ? ?No Known Allergies ? ?ROS ?General: Denies fever, chills, night sweats, changes in weight, changes in appetite ?HEENT: Denies headaches, ear pain, changes in vision, rhinorrhea, sore throat ?CV: Denies CP, palpitations, SOB, orthopnea ?Pulm: Denies SOB, cough, wheezing ?GI: Denies abdominal pain, nausea, vomiting, diarrhea, constipation ?GU: Denies dysuria, hematuria, frequency ?Msk: Denies muscle cramps, joint pains ?Neuro: Denies weakness, numbness, tingling ?Skin: Denies rashes, bruising ?Psych: Denies depression, anxiety, hallucinations ? ?   ?Objective:  ?  ?Blood  pressure (!) 152/80, pulse 87, temperature 98.6 ?F (37 ?C), temperature source Oral, weight 282 lb 9.6 oz (128.2 kg), SpO2 98 %. ? ?Gen. Pleasant, well-nourished, in no distress, normal affect   ?HEENT: [place]/AT, face symmetric, conjunctiva clear, no scleral icterus, PERRLA, EOMI, nares patent without drainage, pharynx without erythema or exudate. ?Lungs: no accessory muscle use, CTAB, no wheezes or rales ?Cardiovascular: RRR, no m/r/g, no peripheral edema ?Musculoskeletal: Left fifth digit partially flexed at baseline.  No deformities.  No cyanosis or clubbing, normal tone ?Neuro:  A&Ox3, CN II-XII intact, normal gait ?Skin:  Warm, no lesions/ rash ? ? ?Wt Readings from Last 3 Encounters:  ?01/25/22 282 lb 9.6 oz (128.2 kg)  ?01/12/22 282 lb 9.6 oz (128.2 kg)  ?12/29/21 278 lb (126.1 kg)  ? ? ?Lab Results  ?Component Value Date  ? WBC 5.5 12/29/2021  ? HGB 10.6 (L) 12/29/2021  ? HCT 33.3 (L) 12/29/2021  ? PLT 213 12/29/2021  ? GLUCOSE 117 (H) 11/23/2021  ? CHOL 115 10/24/2021  ? TRIG 42 10/24/2021  ? HDL 63 10/24/2021  ? LDLDIRECT 130.4 11/06/2011  ? LDLCALC 44 10/24/2021  ? ALT 18 11/23/2021  ? AST 20 11/23/2021  ? NA 136 11/23/2021  ? K 4.9 11/23/2021  ? CL 107 11/23/2021  ? CREATININE 1.31 (H) 11/23/2021  ? BUN 18 11/23/2021  ? CO2 25 11/23/2021  ? TSH 2.31 07/21/2016  ? PSA 0.17 08/05/2020  ? INR 1.5 02/10/2008  ? HGBA1C 5.8 (H) 10/24/2021  ? MICROALBUR 75.9 08/18/2020  ? ? ?  Assessment/Plan: ? ?Essential hypertension  ?-Uncontrolled ?-Given elevation in BP will start metoprolol tartrate 12.5 mg twice daily ?-Continue current medications including lisinopril 40 mg daily and Norvasc 10 mg daily ?-Labs from 11/23/2021 reviewed including BMP ?- Plan: metoprolol tartrate (LOPRESSOR) 25 MG tablet ? ?Controlled type 2 diabetes mellitus with diabetic nephropathy, without long-term current use of insulin ([place]) ?-Hemoglobin A1c 5.8% on 10/24/2021 ?-Currently diet controlled. ?-In the past glipizide discontinued due to  hypoglycemia and during hospitalization 10/2021. ?-Continue to monitor ?-Continue ACE-I and statin (Lipitor 80 mg daily) ?-Rx for freestyle libre sensors sent to pharmacy. ? ?Chronic kidney disease, stage 3a ([place]) ?-Stable ?-Continue increasing water intake ?-Avoid nephrotoxic meds and renally dose medications ?-Continue follow-up with nephrology ? ?Monoclonal gammopathy ?-IgA kappa elevation noted 10/05/2021 ?-s/p bone marrow biopsy 12/29/2021 with findings consistent with MGUS ?-Continue follow-up with oncology ? ?F/u in 1 month for HTN ? ?Grier Mitts, MD ?

## 2022-03-06 DIAGNOSIS — N183 Chronic kidney disease, stage 3 unspecified: Secondary | ICD-10-CM | POA: Diagnosis not present

## 2022-03-08 ENCOUNTER — Telehealth: Payer: Self-pay | Admitting: Family Medicine

## 2022-03-08 DIAGNOSIS — E1151 Type 2 diabetes mellitus with diabetic peripheral angiopathy without gangrene: Secondary | ICD-10-CM

## 2022-03-08 MED ORDER — FREESTYLE LIBRE 14 DAY READER DEVI: 0 refills | Status: AC

## 2022-03-08 NOTE — Telephone Encounter (Signed)
Pt wife is calling and pt needs a refill on Continuous Blood Gluc Receiver (FREESTYLE LIBRE [place]) [place]  ?[id number] [address] [place] Phone:  [phone] ?Fax:  [phone] ?  ? ?

## 2022-03-08 NOTE — Telephone Encounter (Signed)
Spoke with wife, states pt did not receive the reader, just the sensors. Sent Rx for reader to requested pharmacy. ?

## 2022-03-15 DIAGNOSIS — D631 Anemia in chronic kidney disease: Secondary | ICD-10-CM | POA: Diagnosis not present

## 2022-03-15 DIAGNOSIS — N183 Chronic kidney disease, stage 3 unspecified: Secondary | ICD-10-CM | POA: Diagnosis not present

## 2022-03-15 DIAGNOSIS — I129 Hypertensive chronic kidney disease with stage 1 through stage 4 chronic kidney disease, or unspecified chronic kidney disease: Secondary | ICD-10-CM | POA: Diagnosis not present

## 2022-03-15 DIAGNOSIS — R809 Proteinuria, unspecified: Secondary | ICD-10-CM | POA: Diagnosis not present

## 2022-03-23 ENCOUNTER — Other Ambulatory Visit: Payer: Self-pay | Admitting: Family Medicine

## 2022-03-23 DIAGNOSIS — M4726 Other spondylosis with radiculopathy, lumbar region: Secondary | ICD-10-CM

## 2022-04-07 ENCOUNTER — Other Ambulatory Visit: Payer: Self-pay

## 2022-04-07 MED ORDER — FREESTYLE LIBRE READER DEVI: 0 refills | Status: AC

## 2022-04-14 ENCOUNTER — Other Ambulatory Visit: Payer: Self-pay | Admitting: Family Medicine

## 2022-04-14 DIAGNOSIS — M5441 Lumbago with sciatica, right side: Secondary | ICD-10-CM

## 2022-05-05 ENCOUNTER — Other Ambulatory Visit: Payer: Self-pay | Admitting: Family Medicine

## 2022-05-05 DIAGNOSIS — M4726 Other spondylosis with radiculopathy, lumbar region: Secondary | ICD-10-CM

## 2022-05-25 ENCOUNTER — Other Ambulatory Visit: Payer: Self-pay | Admitting: Family Medicine

## 2022-05-25 DIAGNOSIS — R351 Nocturia: Secondary | ICD-10-CM

## 2022-07-03 ENCOUNTER — Other Ambulatory Visit: Payer: Self-pay | Admitting: Family Medicine

## 2022-07-03 DIAGNOSIS — M4726 Other spondylosis with radiculopathy, lumbar region: Secondary | ICD-10-CM

## 2022-07-07 ENCOUNTER — Other Ambulatory Visit: Payer: Self-pay | Admitting: Family Medicine

## 2022-07-07 DIAGNOSIS — M4726 Other spondylosis with radiculopathy, lumbar region: Secondary | ICD-10-CM

## 2022-07-11 ENCOUNTER — Telehealth: Payer: Self-pay | Admitting: Family Medicine

## 2022-07-11 NOTE — Telephone Encounter (Signed)
Spoke with pharmacy, verbally refilled for limited supply as pt needs an appointment.

## 2022-07-11 NOTE — Telephone Encounter (Signed)
Pt states the pharmacy will not fill his traMADol (ULTRAM) 50 MG tablet. Pls advise him

## 2022-07-13 ENCOUNTER — Inpatient Hospital Stay: Payer: BC Managed Care – PPO | Attending: Hematology and Oncology

## 2022-07-13 DIAGNOSIS — D472 Monoclonal gammopathy: Secondary | ICD-10-CM | POA: Insufficient documentation

## 2022-07-13 DIAGNOSIS — I10 Essential (primary) hypertension: Secondary | ICD-10-CM | POA: Insufficient documentation

## 2022-07-13 DIAGNOSIS — E785 Hyperlipidemia, unspecified: Secondary | ICD-10-CM | POA: Insufficient documentation

## 2022-07-13 DIAGNOSIS — I2510 Atherosclerotic heart disease of native coronary artery without angina pectoris: Secondary | ICD-10-CM | POA: Insufficient documentation

## 2022-07-13 DIAGNOSIS — Z79899 Other long term (current) drug therapy: Secondary | ICD-10-CM | POA: Insufficient documentation

## 2022-07-13 DIAGNOSIS — M109 Gout, unspecified: Secondary | ICD-10-CM | POA: Insufficient documentation

## 2022-07-13 DIAGNOSIS — Z87891 Personal history of nicotine dependence: Secondary | ICD-10-CM | POA: Insufficient documentation

## 2022-07-14 ENCOUNTER — Ambulatory Visit (INDEPENDENT_AMBULATORY_CARE_PROVIDER_SITE_OTHER): Payer: BC Managed Care – PPO

## 2022-07-14 VITALS — Ht 71.0 in | Wt 282.0 lb

## 2022-07-14 DIAGNOSIS — Z0000 Encounter for general adult medical examination without abnormal findings: Secondary | ICD-10-CM | POA: Diagnosis not present

## 2022-07-14 NOTE — Patient Instructions (Addendum)
[name] , Thank you for taking time to come for your Medicare Wellness Visit. I appreciate your ongoing commitment to your health goals. Please review the following plan we discussed and let me know if I can assist you in the future.   Screening recommendations/referrals: Colonoscopy: Done 03/24/12 Repeat 5 Yr Recommended yearly ophthalmology/optometry visit for glaucoma screening and checkup Recommended yearly dental visit for hygiene and checkup  Vaccinations: Influenza vaccine: Up to date Pneumococcal vaccine: Up to date Tdap vaccine: Due Shingles vaccine: Deferred   Covid-19: Done  Advanced directives: Advance directive discussed with you today. Even though you declined this today, please call our office should you change your mind, and we can give you the proper paperwork for you to fill out.   Conditions/risks identified: none  Next appointment: Follow up in one year for your annual wellness visit.    Preventive Care 34 Years and Older, Male  Preventive care refers to lifestyle choices and visits with your health care provider that can promote health and wellness. What does preventive care include? A yearly physical exam. This is also called an annual well check. Dental exams once or twice a year. Routine eye exams. Ask your health care provider how often you should have your eyes checked. Personal lifestyle choices, including: Daily care of your teeth and gums. Regular physical activity. Eating a healthy diet. Avoiding tobacco and drug use. Limiting alcohol use. Practicing safe sex. Taking low doses of aspirin every day. Taking vitamin and mineral supplements as recommended by your health care provider. What happens during an annual well check? The services and screenings done by your health care provider during your annual well check will depend on your age, overall health, lifestyle risk factors, and family history of disease. Counseling  Your health care provider  may ask you questions about your: Alcohol use. Tobacco use. Drug use. Emotional well-being. Home and relationship well-being. Sexual activity. Eating habits. History of falls. Memory and ability to understand (cognition). Work and work Statistician. Screening  You may have the following tests or measurements: Height, weight, and BMI. Blood pressure. Lipid and cholesterol levels. These may be checked every 5 years, or more frequently if you are over 63 years old. Skin check. Lung cancer screening. You may have this screening every year starting at age 41 if you have a 30-pack-year history of smoking and currently smoke or have quit within the past 15 years. Fecal occult blood test (FOBT) of the stool. You may have this test every year starting at age 62. Flexible sigmoidoscopy or colonoscopy. You may have a sigmoidoscopy every 5 years or a colonoscopy every 10 years starting at age 20. Prostate cancer screening. Recommendations will vary depending on your family history and other risks. Hepatitis C blood test. Hepatitis B blood test. Sexually transmitted disease (STD) testing. Diabetes screening. This is done by checking your blood sugar (glucose) after you have not eaten for a while (fasting). You may have this done every 1-3 years. Abdominal aortic aneurysm (AAA) screening. You may need this if you are a current or former smoker. Osteoporosis. You may be screened starting at age 29 if you are at high risk. Talk with your health care provider about your test results, treatment options, and if necessary, the need for more tests. Vaccines  Your health care provider may recommend certain vaccines, such as: Influenza vaccine. This is recommended every year. Tetanus, diphtheria, and acellular pertussis (Tdap, Td) vaccine. You may need a Td booster every 10 years.  Zoster vaccine. You may need this after age 63. Pneumococcal 13-valent conjugate (PCV13) vaccine. One dose is recommended after  age 68. Pneumococcal polysaccharide (PPSV23) vaccine. One dose is recommended after age 42. Talk to your health care provider about which screenings and vaccines you need and how often you need them. This information is not intended to replace advice given to you by your health care provider. Make sure you discuss any questions you have with your health care provider. Document Released: 11/19/2015 Document Revised: 07/12/2016 Document Reviewed: 08/24/2015 Elsevier Interactive Patient Education  2017 [place] Prevention in the Home Falls can cause injuries. They can happen to people of all ages. There are many things you can do to make your home safe and to help prevent falls. What can I do on the outside of my home? Regularly fix the edges of walkways and driveways and fix any cracks. Remove anything that might make you trip as you walk through a door, such as a raised step or threshold. Trim any bushes or trees on the path to your home. Use bright outdoor lighting. Clear any walking paths of anything that might make someone trip, such as rocks or tools. Regularly check to see if handrails are loose or broken. Make sure that both sides of any steps have handrails. Any raised decks and porches should have guardrails on the edges. Have any leaves, snow, or ice cleared regularly. Use sand or salt on walking paths during winter. Clean up any spills in your garage right away. This includes oil or grease spills. What can I do in the bathroom? Use night lights. Install grab bars by the toilet and in the tub and shower. Do not use towel bars as grab bars. Use non-skid mats or decals in the tub or shower. If you need to sit down in the shower, use a plastic, non-slip stool. Keep the floor dry. Clean up any water that spills on the floor as soon as it happens. Remove soap buildup in the tub or shower regularly. Attach bath mats securely with double-sided non-slip rug tape. Do not have  throw rugs and other things on the floor that can make you trip. What can I do in the bedroom? Use night lights. Make sure that you have a light by your bed that is easy to reach. Do not use any sheets or blankets that are too big for your bed. They should not hang down onto the floor. Have a firm chair that has side arms. You can use this for support while you get dressed. Do not have throw rugs and other things on the floor that can make you trip. What can I do in the kitchen? Clean up any spills right away. Avoid walking on wet floors. Keep items that you use a lot in easy-to-reach places. If you need to reach something above you, use a strong step stool that has a grab bar. Keep electrical cords out of the way. Do not use floor polish or wax that makes floors slippery. If you must use wax, use non-skid floor wax. Do not have throw rugs and other things on the floor that can make you trip. What can I do with my stairs? Do not leave any items on the stairs. Make sure that there are handrails on both sides of the stairs and use them. Fix handrails that are broken or loose. Make sure that handrails are as long as the stairways. Check any carpeting to make sure that  it is firmly attached to the stairs. Fix any carpet that is loose or worn. Avoid having throw rugs at the top or bottom of the stairs. If you do have throw rugs, attach them to the floor with carpet tape. Make sure that you have a light switch at the top of the stairs and the bottom of the stairs. If you do not have them, ask someone to add them for you. What else can I do to help prevent falls? Wear shoes that: Do not have high heels. Have rubber bottoms. Are comfortable and fit you well. Are closed at the toe. Do not wear sandals. If you use a stepladder: Make sure that it is fully opened. Do not climb a closed stepladder. Make sure that both sides of the stepladder are locked into place. Ask someone to hold it for you, if  possible. Clearly mark and make sure that you can see: Any grab bars or handrails. First and last steps. Where the edge of each step is. Use tools that help you move around (mobility aids) if they are needed. These include: Canes. Walkers. Scooters. Crutches. Turn on the lights when you go into a dark area. Replace any light bulbs as soon as they burn out. Set up your furniture so you have a clear path. Avoid moving your furniture around. If any of your floors are uneven, fix them. If there are any pets around you, be aware of where they are. Review your medicines with your doctor. Some medicines can make you feel dizzy. This can increase your chance of falling. Ask your doctor what other things that you can do to help prevent falls. This information is not intended to replace advice given to you by your health care provider. Make sure you discuss any questions you have with your health care provider. Document Released: 08/19/2009 Document Revised: 03/30/2016 Document Reviewed: 11/27/2014 Elsevier Interactive Patient Education  2017 [employer].

## 2022-07-14 NOTE — Progress Notes (Signed)
Subjective:   [name] is a 76 y.o. male who presents for Medicare Annual/Subsequent preventive examination.  Review of Systems    Virtual Visit via Telephone Note  I connected with  [name] on 07/14/22 at  3:00 PM EDT by telephone and verified that I am speaking with the correct person using two identifiers.  Location: Patient: Home Provider: Office Persons participating in the virtual visit: patient/Nurse Health Advisor   I discussed the limitations, risks, security and privacy concerns of performing an evaluation and management service by telephone and the availability of in person appointments. The patient expressed understanding and agreed to proceed.  Interactive audio and video telecommunications were attempted between this nurse and patient, however failed, due to patient having technical difficulties OR patient did not have access to video capability.  We continued and completed visit with audio only.  Some vital signs may be absent or patient reported.   [name], LPN  Cardiac Risk Factors include: advanced age (>25mn, >>56women);diabetes mellitus;hypertension;male gender     Objective:    Today's Vitals   07/14/22 1514  Weight: 282 lb (127.9 kg)  Height: '5\' 11"'$  (1.803 m)   Body mass index is 39.33 kg/m.     07/14/2022    3:23 PM 10/24/2021   11:00 AM 07/12/2021    8:23 AM 04/26/2021   11:09 AM 07/30/2013   12:14 AM 07/22/2013    9:40 AM 08/25/2012    8:36 PM  Advanced Directives  Does Patient Have a Medical Advance Directive? No No No No Patient does not have advance directive;Patient would not like information Patient does not have advance directive;Patient would not like information Patient does not have advance directive  Would patient like information on creating a medical advance directive? No - Patient declined Yes (Inpatient - patient defers creating a medical advance directive at this time - Information given) No - Patient declined  No - Patient declined     Pre-existing out of facility DNR order (yellow form or pink MOST form)     No No No    Current Medications (verified) Outpatient Encounter Medications as of 07/14/2022  Medication Sig   allopurinol (ZYLOPRIM) 100 MG tablet TAKE 1 TABLET BY MOUTH  DAILY (Patient taking differently: Take 100 mg by mouth daily.)   amLODipine (NORVASC) 10 MG tablet TAKE 1 TABLET BY MOUTH  DAILY   atorvastatin (LIPITOR) 80 MG tablet TAKE 1 TABLET BY MOUTH EVERY MORNING. (Patient taking differently: Take 80 mg by mouth daily.)   colchicine 0.6 MG tablet Take 2 tabs (1.'2mg'$ ) at fist sign of gout flare.  Then take 1 tab 1 hour later.  On day 2 take one tab daily until flare stops.   Continuous Blood Gluc Receiver (FREESTYLE LIBRE 14 DAY READER) DEVI Use as directed   Continuous Blood Gluc Receiver (FREESTYLE LIBRE READER) DEVI Use with Freestyle Sensor to check blood sugar levels.   Continuous Blood Gluc Sensor (FREESTYLE LIBRE SENSOR SYSTEM) MISC Apply device as directed every 2 weeks.   gabapentin (NEURONTIN) 100 MG capsule Take 1 capsule (100 mg total) by mouth at bedtime as needed (pain).   glucose blood (ONE TOUCH ULTRA TEST) test strip 1 each by Other route daily. Use as instructed   Lancets (ONETOUCH ULTRASOFT) lancets 1 each by Other route daily. Use as instructed   lisinopril (ZESTRIL) 40 MG tablet Take 1 tablet (40 mg total) by mouth daily.   LOKELMA 10 g PACK packet Take 1  packet by mouth daily.   metoprolol tartrate (LOPRESSOR) 25 MG tablet Take 0.5 tablets (12.5 mg total) by mouth 2 (two) times daily.   nitroGLYCERIN (NITROSTAT) 0.4 MG SL tablet Place 1 tablet (0.4 mg total) under the tongue every 5 (five) minutes as needed for chest pain.   sildenafil (VIAGRA) 25 MG tablet Take 1 tablet (25 mg total) by mouth daily as needed for erectile dysfunction.   tamsulosin (FLOMAX) 0.4 MG CAPS capsule TAKE 1 CAPSULE BY MOUTH EVERY DAY   traMADol (ULTRAM) 50 MG tablet TAKE 2 TABLETS BY MOUTH  EVERY 12 HOURS AS NEEDED.   No facility-administered encounter medications on file as of 07/14/2022.    Allergies (verified) Patient has no known allergies.   History: Past Medical History:  Diagnosis Date   CAD, ARTERY BYPASS GRAFT 02/10/2008   CAROTID ARTERY STENOSIS, WITHOUT INFARCTION 09/22/2008   CORONARY ARTERY DISEASE 04/03/2008   Diabetes mellitus    "borderline"   ERECTILE DYSFUNCTION 01/14/2009   GOUT 01/16/2008   HERPES ZOSTER 04/15/2010   HYPERLIPIDEMIA 01/16/2008   HYPERTENSION 01/16/2008   IMPAIRED GLUCOSE TOLERANCE 08/22/2010   Shingles 2011   left chest   Ulcer 30 yrs ago   stomach   Past Surgical History:  Procedure Laterality Date   COLONOSCOPY     3 months ago   CORONARY ARTERY BYPASS GRAFT  2009   vessels x3   EAR CYST EXCISION N/A 07/29/2013   Procedure: excision of urachal cyst flexible cystoscopy insertion of foley cath;  Surgeon: Fredricka Bonine, MD;  Location: [facility];  Service: Urology;  Laterality: N/A;   LAPAROSCOPY N/A 07/29/2013   Procedure: LAPAROSCOPY DIAGNOSTIC  laparoscopic excision of urachal cyst ;  Surgeon: Madilyn Hook, DO;  Location: [facility];  Service: General;  Laterality: N/A;   LAPAROTOMY  08/25/2012   Procedure: EXPLORATORY LAPAROTOMY;  Surgeon: Madilyn Hook, DO;  Location: [facility];  Service: General;  Laterality: N/A;  incision and drainage abdominal wall abcessand intra abdominal wall abcess   PARTIAL GASTRECTOMY  1974   bleeding ulcers   urachal  2014   Urachal cyst removal   Family History  Problem Relation Age of Onset   Heart disease Mother    Heart disease Sister    Colon cancer Neg Hx    Social History   Socioeconomic History   Marital status: Married    Spouse name: Not on file   Number of children: 3   Years of education: Not on file   Highest education level: Not on file  Occupational History    Employer: [employer]  Tobacco Use   Smoking status: Former    Packs/day: 1.00    Years: 10.00    Total pack  years: 10.00    Types: Cigarettes    Quit date: 07/27/1999    Years since quitting: 22.9   Smokeless tobacco: Never  Substance and Sexual Activity   Alcohol use: Yes    Comment: 4 beers a day   Drug use: No   Sexual activity: Not on file  Other Topics Concern   Not on file  Social History Narrative   epworth sleepiness scale score: 4   Social Determinants of Health   Financial Resource Strain: Low Risk  (07/14/2022)   Overall Financial Resource Strain (CARDIA)    Difficulty of Paying Living Expenses: Not hard at all  Food Insecurity: No Food Insecurity (07/14/2022)   Hunger Vital Sign    Worried About Charity fundraiser in  the Last Year: Never true    [place] in the Last Year: Never true  Transportation Needs: No Transportation Needs (07/14/2022)   PRAPARE - Hydrologist (Medical): No    Lack of Transportation (Non-Medical): No  Physical Activity: Insufficiently Active (07/14/2022)   Exercise Vital Sign    Days of Exercise per Week: 3 days    Minutes of Exercise per Session: 20 min  Stress: No Stress Concern Present (07/14/2022)   [place]    Feeling of Stress : Not at all  Social Connections: [place] (07/14/2022)   Social Connection and Isolation Panel [NHANES]    Frequency of Communication with Friends and Family: More than three times a week    Frequency of Social Gatherings with Friends and Family: More than three times a week    Attends Religious Services: More than 4 times per year    Active Member of [employer] or Organizations: Yes    Attends Music therapist: More than 4 times per year    Marital Status: Married    Tobacco Counseling Counseling given: Not Answered   Clinical Intake:  Pre-visit preparation completed: No Nutrition Risk Assessment:  Has the patient had any N/V/D within the last 2 months?  No  Does the patient have any non-healing  wounds?  No  Has the patient had any unintentional weight loss or weight gain?  No   Diabetes:  Is the patient diabetic?  Yes  If diabetic, was a CBG obtained today?  No  Did the patient bring in their glucometer from home?  No  How often do you monitor your CBG's? Every other day.   Financial Strains and Diabetes Management:  Are you having any financial strains with the device, your supplies or your medication? No .  Does the patient want to be seen by Chronic Care Management for management of their diabetes?  No  Would the patient like to be referred to a Nutritionist or for Diabetic Management?  No   Diabetic Exams:  Diabetic Eye Exam: Completed No. Overdue for diabetic eye exam. Pt has been advised about the importance in completing this exam. A referral has been placed today. Message sent to referral coordinator for scheduling purposes. Advised pt to expect a call from office referred to regarding appt.  Diabetic Foot Exam: Completed No. Pt has been advised about the importance in completing this exam. Pt is scheduled for diabetic foot exam on Followed by PCP.   Pain : No/denies pain     BMI - recorded: 40.55 Nutritional Status: BMI > 30  Obese Nutritional Risks: None Diabetes: Yes CBG done?: No Did pt. bring in CBG monitor from home?: No  How often do you need to have someone help you when you read instructions, pamphlets, or other written materials from your doctor or pharmacy?: 1 - Never  Diabetic?  Yes  Interpreter Needed?: No  Information entered by :: Rolene Arbour LPN   Activities of Daily Living    07/14/2022    3:21 PM 10/24/2021   11:00 AM  In your present state of health, do you have any difficulty performing the following activities:  Hearing? 0   Vision? 0   Difficulty concentrating or making decisions? 0   Walking or climbing stairs? 0   Dressing or bathing? 0   Doing errands, shopping? 0 0  Preparing Food and eating ? N   Using  the Toilet? N    In the past six months, have you accidently leaked urine? N   Do you have problems with loss of bowel control? N   Managing your Medications? N   Managing your Finances? N   Housekeeping or managing your Housekeeping? N     Patient Care Team: [name], MD as PCP - General (Family Medicine) [name], [place] as Referring Physician (Optometry)  Indicate any recent Medical Services you may have received from other than [facility] providers in the past year (date may be approximate).     Assessment:   This is a routine wellness examination for [name].  Hearing/Vision screen Hearing Screening - Comments:: Denies hearing difficulties   Vision Screening - Comments:: Wears reading glasses - up to date with routine eye exams with  Dr Einar Gip  Dietary issues and exercise activities discussed: Current Exercise Habits: Home exercise routine, Type of exercise: walking, Time (Minutes): 20, Frequency (Times/Week): 3, Weekly Exercise (Minutes/Week): 60, Intensity: Mild, Exercise limited by: None identified   Goals Addressed               This Visit's Progress     Patient stated (pt-stated)        I 'm trying to retire       Depression Screen    07/14/2022    3:19 PM 01/25/2022    1:29 PM 10/28/2021    1:17 PM 07/12/2021    8:25 AM 04/08/2020   12:31 PM 12/01/2016    2:54 PM 07/01/2015   10:38 AM  PHQ 2/9 Scores  PHQ - 2 Score 0 0 0 0 0 0 0  PHQ- 9 Score  0 0        Fall Risk    07/14/2022    3:21 PM 01/25/2022    1:28 PM 10/28/2021    1:16 PM 07/12/2021    8:24 AM 04/08/2020   12:32 PM  Fall Risk   Falls in the past year? 0 0 0 0 0  Number falls in past yr: 0 0 0 0   Injury with Fall? 0 0 0 0   Risk for fall due to : No Fall Risks No Fall Risks No Fall Risks No Fall Risks   Follow up Falls prevention discussed Falls evaluation completed Falls evaluation completed Falls evaluation completed     Aline:  Any stairs in or around the  home? No  If so, are there any without handrails? No  Home free of loose throw rugs in walkways, pet beds, electrical cords, etc? Yes  Adequate lighting in your home to reduce risk of falls? Yes   ASSISTIVE DEVICES UTILIZED TO PREVENT FALLS:  Life alert? No  Use of a cane, walker or w/c? No  Grab bars in the bathroom? No  Shower chair or bench in shower? No  Elevated toilet seat or a handicapped toilet? Yes  TIMED UP AND GO:  Was the test performed? No . Audio Visit   Cognitive Function:        07/14/2022    3:23 PM  6CIT Screen  What Year? 0 points  What month? 0 points  What time? 0 points  Count back from 20 0 points  Months in reverse 0 points  Repeat phrase 0 points  Total Score 0 points    Immunizations Immunization History  Administered Date(s) Administered   Fluad Quad(high Dose 65+) 08/05/2020, 10/25/2021   Influenza Split 08/27/2012  Influenza Whole 08/22/2010   Influenza, High Dose Seasonal PF 11/12/2014, 07/21/2016, 08/27/2017, 10/10/2018, 08/04/2019   Influenza,inj,Quad PF,6+ Mos 07/30/2013   Influenza-Unspecified 08/04/2019   PFIZER Comirnaty(Gray Top)Covid-19 Tri-Sucrose Vaccine 06/03/2021   PFIZER(Purple Top)SARS-COV-2 Vaccination 01/02/2020, 01/28/2020   Pneumococcal Conjugate-13 07/01/2015   Pneumococcal Polysaccharide-23 12/01/2016   Tdap 11/13/2011    TDAP status: Due, Education has been provided regarding the importance of this vaccine. Advised may receive this vaccine at local pharmacy or Health Dept. Aware to provide a copy of the vaccination record if obtained from local pharmacy or Health Dept. Verbalized acceptance and understanding.  Flu Vaccine status: Up to date  Pneumococcal vaccine status: Up to date  Covid-19 vaccine status: Completed vaccines  Qualifies for Shingles Vaccine? Yes   Zostavax completed No   Shingrix Completed?: No.    Education has been provided regarding the importance of this vaccine. Patient has been advised  to call insurance company to determine out of pocket expense if they have not yet received this vaccine. Advised may also receive vaccine at local pharmacy or Health Dept. Verbalized acceptance and understanding.  Screening Tests Health Maintenance  Topic Date Due   FOOT EXAM  04/08/2021   OPHTHALMOLOGY EXAM  05/12/2021   HEMOGLOBIN A1C  04/24/2022   COVID-19 Vaccine (4 - Pfizer series) 07/30/2022 (Originally 07/29/2021)   Zoster Vaccines- Shingrix (1 of 2) 10/13/2022 (Originally 08/04/1998)   COLONOSCOPY (Pts 45-24yr Insurance coverage will need to be confirmed)  10/28/2022 (Originally 06/24/2017)   INFLUENZA VACCINE  02/04/2023 (Originally 06/06/2022)   TETANUS/TDAP  07/15/2023 (Originally 11/12/2021)   Pneumonia Vaccine 76 Years old  Completed   Hepatitis C Screening  Completed   HPV VACCINES  Aged Out    Health Maintenance  Health Maintenance Due  Topic Date Due   FOOT EXAM  04/08/2021   OPHTHALMOLOGY EXAM  05/12/2021   HEMOGLOBIN A1C  04/24/2022    Colorectal cancer screening: Type of screening: Colonoscopy. Completed 03/24/12. Repeat every 5 years  Lung Cancer Screening: (Low Dose CT Chest recommended if Age 11027-80years, 30 pack-year currently smoking OR have quit w/in 15years.) does not qualify.     Additional Screening:  Hepatitis C Screening: does qualify; Completed 04/08/20  Vision Screening: Recommended annual ophthalmology exams for early detection of glaucoma and other disorders of the eye. Is the patient up to date with their annual eye exam?  Yes  Who is the provider or what is the name of the office in which the patient attends annual eye exams? Dr MEinar GipIf pt is not established with a provider, would they like to be referred to a provider to establish care? No .   Dental Screening: Recommended annual dental exams for proper oral hygiene  Community Resource Referral / Chronic Care Management:  CRR required this visit?  No   CCM required this visit?  No       Plan:     I have personally reviewed and noted the following in the patient's chart:   Medical and social history Use of alcohol, tobacco or illicit drugs  Current medications and supplements including opioid prescriptions. Patient currently taking opioids Functional ability and status Nutritional status Physical activity Advanced directives List of other physicians Hospitalizations, surgeries, and ER visits in previous 12 months Vitals Screenings to include cognitive, depression, and falls Referrals and appointments  In addition, I have reviewed and discussed with patient certain preventive protocols, quality metrics, and best practice recommendations. A written personalized care plan for preventive services as well  as general preventive health recommendations were provided to patient.     [name], LPN   05/14/1504   Nurse Notes:Patient due labs Hemoglobin A1C

## 2022-07-16 IMAGING — US US RENAL
1 series · 14 of 25 positions shown · non-contrast
Comparison: CT 06/04/2013

CLINICAL DATA: Stage 3 chronic kidney disease, unspecified whether
stage 3a or 3b CKD (HCC). Hypertension, renal disease.

EXAM:
RENAL / URINARY TRACT ULTRASOUND COMPLETE

[Series 1: us renal · 0.26mm/px · 14 of 50 slices shown]
[im 1/50]
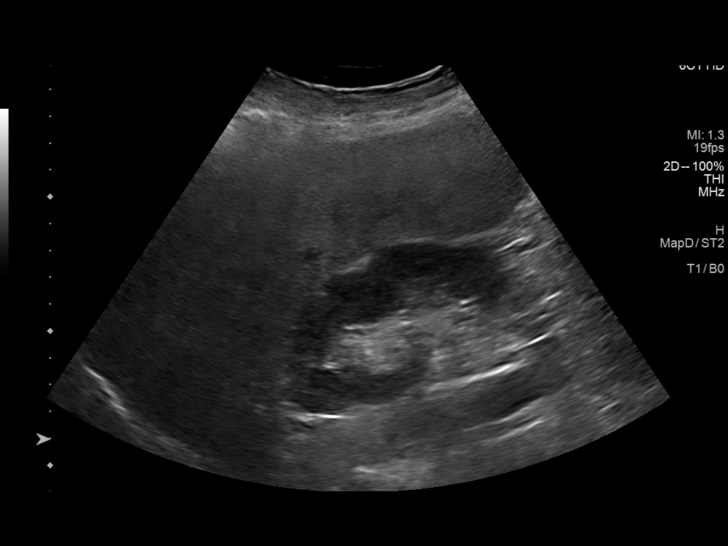
[im 5/50]
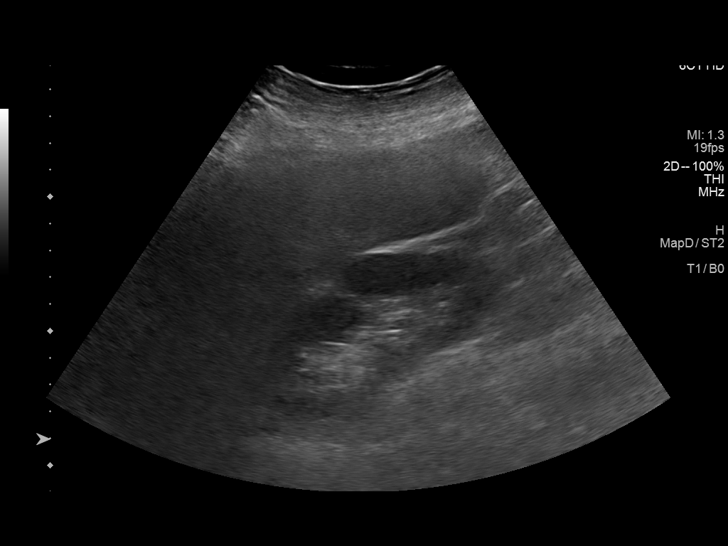
[im 9/50]
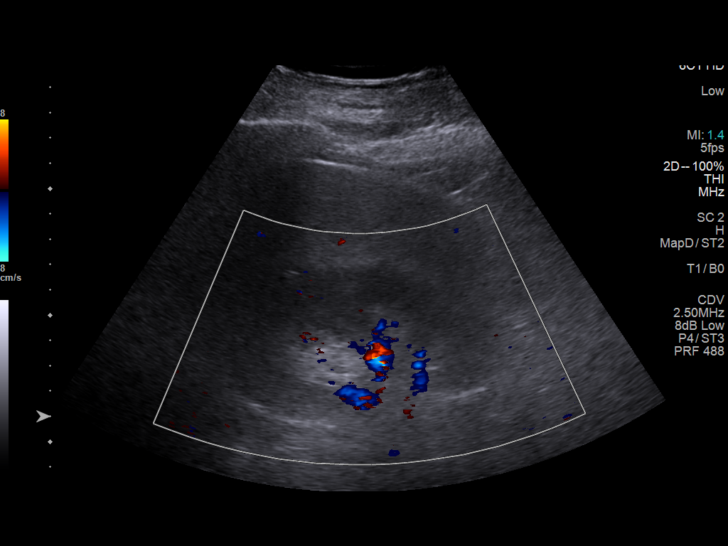
[im 13/50]
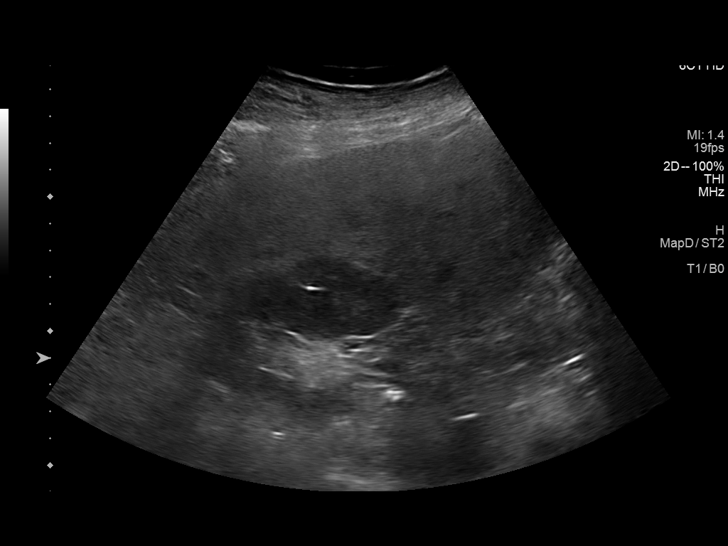
[im 17/50]
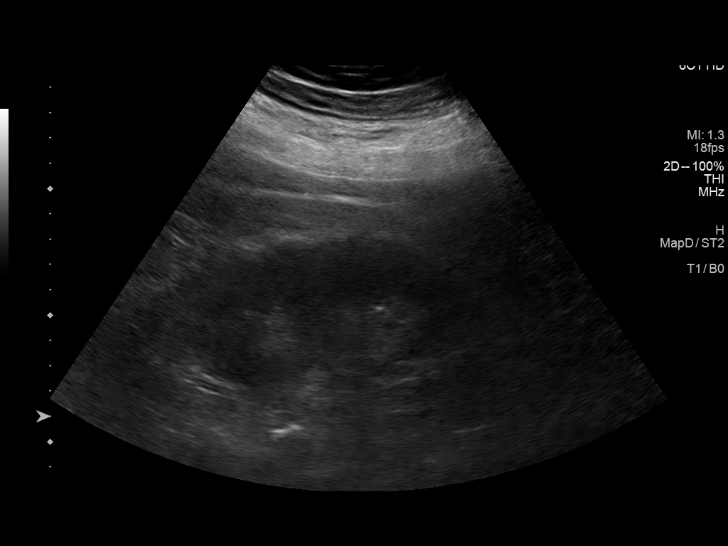
[im 19/50]
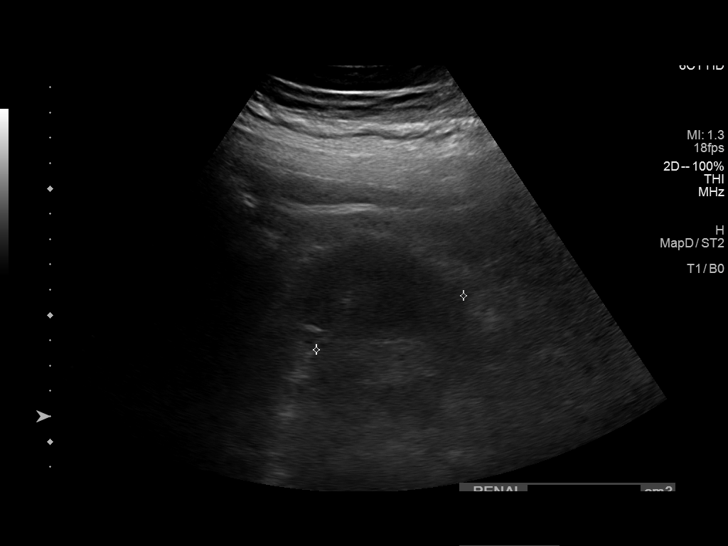
[im 23/50]
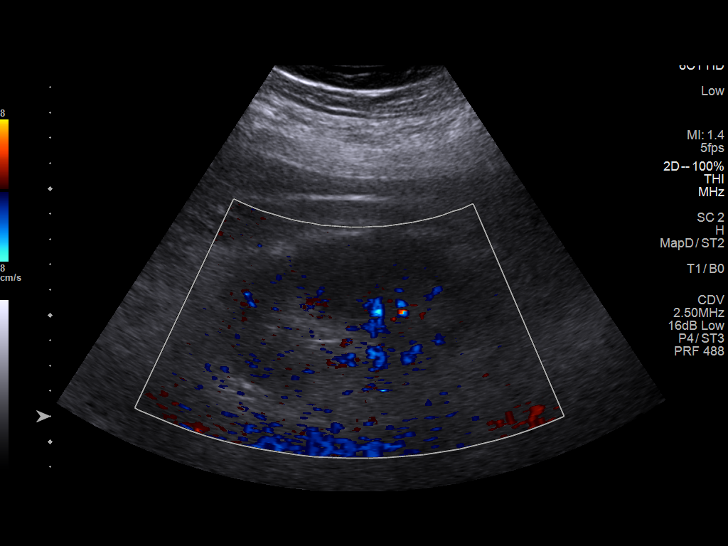
[im 27/50]
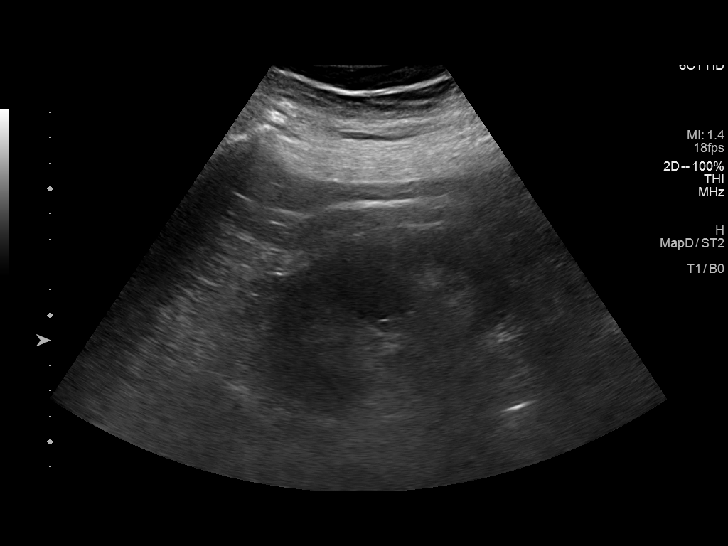
[im 31/50]
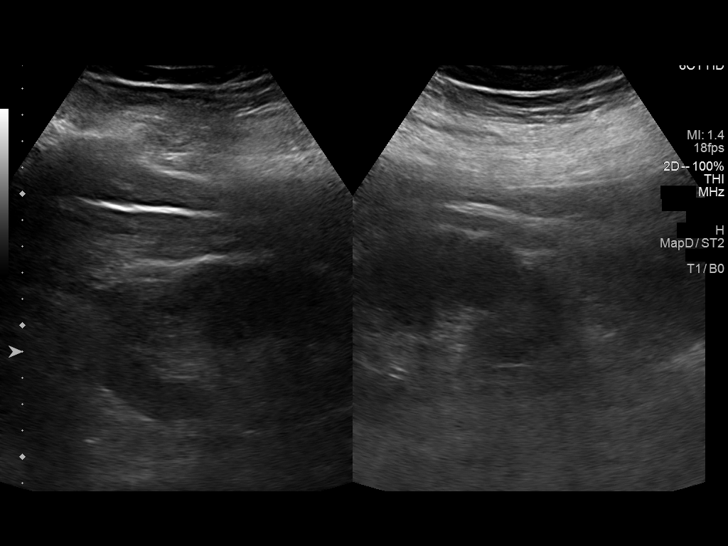
[im 33/50]
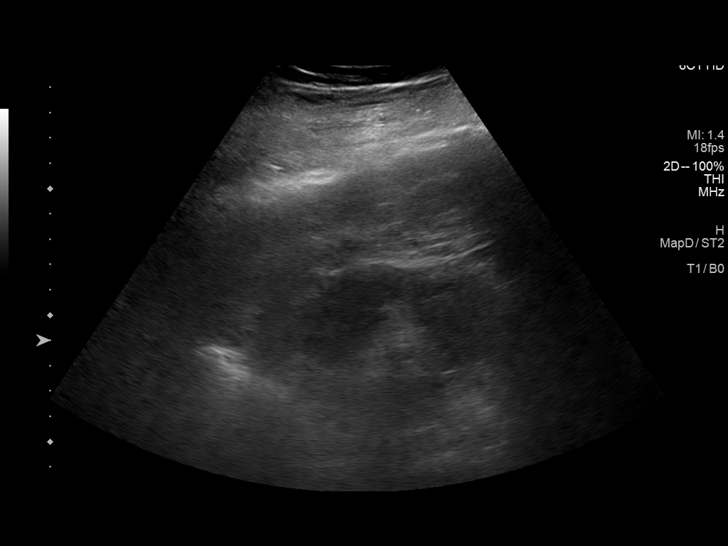
[im 37/50]
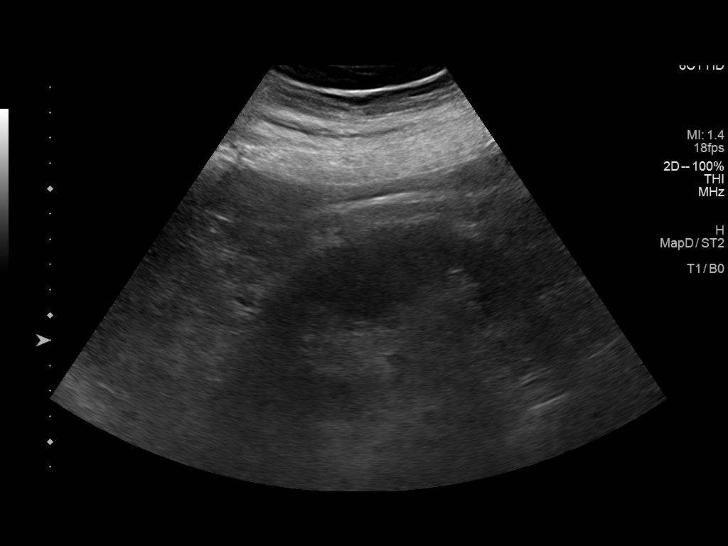
[im 41/50]
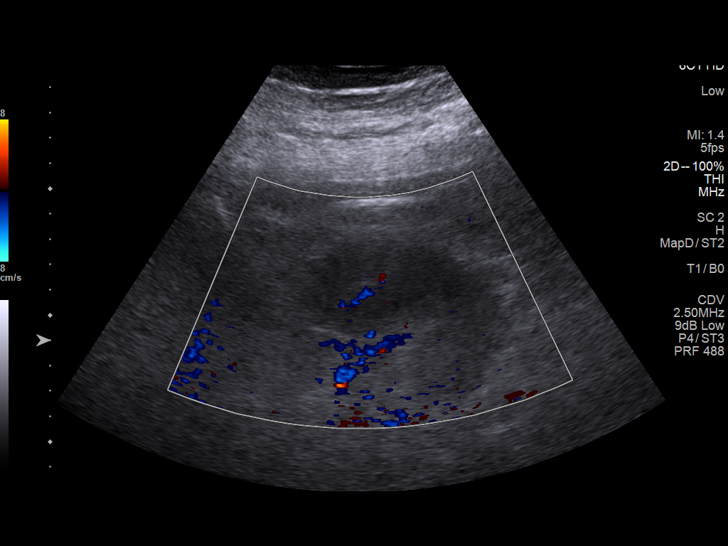
[im 45/50]
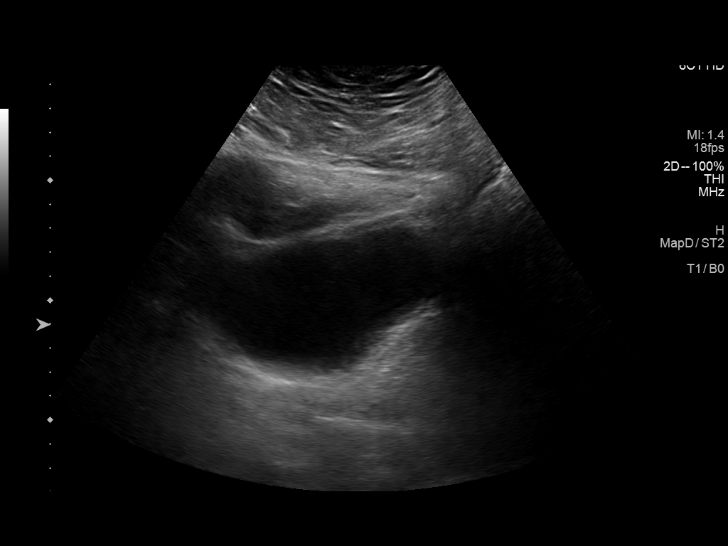
[im 50/50]
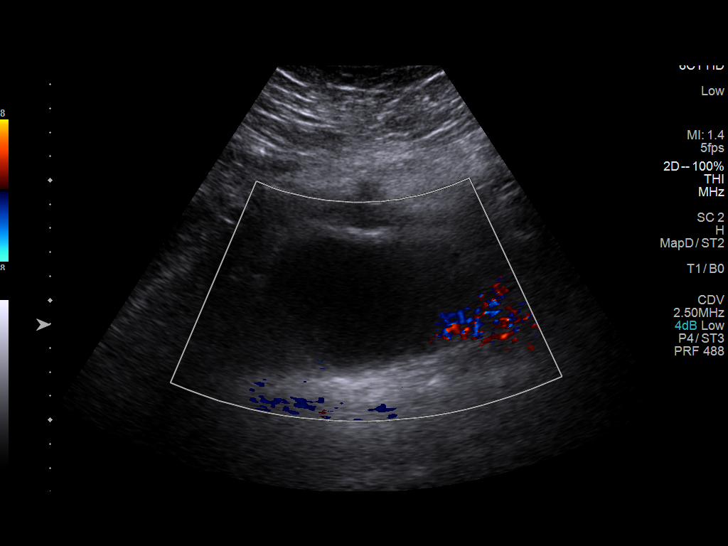

[14 of 25 positions shown; findings below may reference images not displayed]

FINDINGS: Right Kidney:

Renal measurements: 10.7 x 4.9 x 6.5 cm = volume: 178 mL.
Echogenicity within normal limits. No mass or hydronephrosis
visualized.

Left Kidney:

Renal measurements: 12.3 x 5.5 x 6.2 cm = volume: 220 mL.
Echogenicity is within normal limits. There is a likely column of
Bertin in the left mid kidney indenting the renal sinus fat, as this
has identical echogenicity to the adjacent cortex.

Bladder:

Appears normal for degree of bladder distention.

Other:

None.
IMPRESSION: No hydronephrosis.

## 2022-07-20 ENCOUNTER — Other Ambulatory Visit: Payer: Self-pay

## 2022-07-20 ENCOUNTER — Inpatient Hospital Stay: Payer: BC Managed Care – PPO

## 2022-07-20 ENCOUNTER — Telehealth: Payer: Self-pay | Admitting: Hematology and Oncology

## 2022-07-20 ENCOUNTER — Other Ambulatory Visit: Payer: Self-pay | Admitting: Hematology and Oncology

## 2022-07-20 ENCOUNTER — Inpatient Hospital Stay (HOSPITAL_BASED_OUTPATIENT_CLINIC_OR_DEPARTMENT_OTHER): Payer: BC Managed Care – PPO | Admitting: Hematology and Oncology

## 2022-07-20 VITALS — BP 146/67 | HR 75 | Temp 98.1°F | Resp 16 | Wt 272.9 lb

## 2022-07-20 DIAGNOSIS — Z79899 Other long term (current) drug therapy: Secondary | ICD-10-CM | POA: Diagnosis not present

## 2022-07-20 DIAGNOSIS — D472 Monoclonal gammopathy: Secondary | ICD-10-CM

## 2022-07-20 DIAGNOSIS — Z87891 Personal history of nicotine dependence: Secondary | ICD-10-CM | POA: Diagnosis not present

## 2022-07-20 DIAGNOSIS — M109 Gout, unspecified: Secondary | ICD-10-CM | POA: Diagnosis not present

## 2022-07-20 DIAGNOSIS — E785 Hyperlipidemia, unspecified: Secondary | ICD-10-CM | POA: Diagnosis not present

## 2022-07-20 DIAGNOSIS — I2510 Atherosclerotic heart disease of native coronary artery without angina pectoris: Secondary | ICD-10-CM | POA: Diagnosis not present

## 2022-07-20 DIAGNOSIS — I10 Essential (primary) hypertension: Secondary | ICD-10-CM | POA: Diagnosis not present

## 2022-07-20 LAB — CMP (CANCER CENTER ONLY)
ALT: 17 U/L (ref 0–44)
AST: 27 U/L (ref 15–41)
Albumin: 3.6 g/dL (ref 3.5–5.0)
Alkaline Phosphatase: 81 U/L (ref 38–126)
Anion gap: 5 (ref 5–15)
BUN: 47 mg/dL — ABNORMAL HIGH (ref 8–23)
CO2: 20 mmol/L — ABNORMAL LOW (ref 22–32)
Calcium: 9.4 mg/dL (ref 8.9–10.3)
Chloride: 111 mmol/L (ref 98–111)
Creatinine: 2.18 mg/dL — ABNORMAL HIGH (ref 0.61–1.24)
GFR, Estimated: 31 mL/min — ABNORMAL LOW (ref >60)
Glucose, Bld: 126 mg/dL — ABNORMAL HIGH (ref 70–99)
Potassium: 5.4 mmol/L — ABNORMAL HIGH (ref 3.5–5.1)
Sodium: 136 mmol/L (ref 135–145)
Total Bilirubin: 0.4 mg/dL (ref 0.3–1.2)
Total Protein: 7.3 g/dL (ref 6.5–8.1)

## 2022-07-20 LAB — CBC WITH DIFFERENTIAL (CANCER CENTER ONLY)
Abs Immature Granulocytes: 0.02 10*3/uL (ref 0.00–0.07)
Basophils Absolute: 0 10*3/uL (ref 0.0–0.1)
Basophils Relative: 0 %
Eosinophils Absolute: 0.1 10*3/uL (ref 0.0–0.5)
Eosinophils Relative: 2 %
HCT: 34.9 % — ABNORMAL LOW (ref 39.0–52.0)
Hemoglobin: 11.3 g/dL — ABNORMAL LOW (ref 13.0–17.0)
Immature Granulocytes: 0 %
Lymphocytes Relative: 18 %
Lymphs Abs: 1.2 10*3/uL (ref 0.7–4.0)
MCH: 31.9 pg (ref 26.0–34.0)
MCHC: 32.4 g/dL (ref 30.0–36.0)
MCV: 98.6 fL (ref 80.0–100.0)
Monocytes Absolute: 0.8 10*3/uL (ref 0.1–1.0)
Monocytes Relative: 12 %
Neutro Abs: 4.5 10*3/uL (ref 1.7–7.7)
Neutrophils Relative %: 68 %
Platelet Count: 221 10*3/uL (ref 150–400)
RBC: 3.54 MIL/uL — ABNORMAL LOW (ref 4.22–5.81)
RDW: 15.1 % (ref 11.5–15.5)
WBC Count: 6.6 10*3/uL (ref 4.0–10.5)
nRBC: 0 % (ref 0.0–0.2)

## 2022-07-20 LAB — LACTATE DEHYDROGENASE: LDH: 172 U/L (ref 98–192)

## 2022-07-20 NOTE — Telephone Encounter (Signed)
Scheduled per 9/14 in basket, message has been left

## 2022-07-20 NOTE — Progress Notes (Signed)
[place] [phone]   [phone]  PROGRESS NOTE  Patient Care Team: [name], MD as PCP - General (Family Medicine) [name], [place] as Referring Physician (Optometry)  Hematological/Oncological History # IgA kappa Monoclonal Gammopathy  10/05/2021: evaluated by [facility]. Found to have M protein 0.6, Kappa 130, Lambda 40.3, Ratio 3.25. IFE shows an IgA Kappa monoclonal protein. Cr 2.00 11/23/2021: establish care with Dr. Lorenso Courier  12/29/2021: Bone marrow biopsy performed showed hypercellular bone marrow for age with light plasmacytosis (5%)  Interval History:  [name] 76 y.o. male with medical history significant for an IgA kappa monoclonal gammopathy who presents for a follow up visit. The patient's last visit was on 01/12/2022. In the interim since the last visit he has had no major changes in his health.  On exam today [name] notes he has been well overall interim since our last visit.  He has had no major changes in his health.  His energy levels are quite good.  He notes his energy is currently an 8 out of 10.  He is eating well.  He is not currently having any bone or back pain.  He reports his bowels are moving well without any difficulty.  He notes that he has not seen any changes in his urinary habits.  His urine remains a light yellow with no bubbling or fall.  He has had about 10 pounds of weight loss which has been intentional.  He denies any shortness of breath or chest pain.  Overall he is at his baseline level of health.  A full 10 point ROS is listed below.  MEDICAL HISTORY:  Past Medical History:  Diagnosis Date   CAD, ARTERY BYPASS GRAFT 02/10/2008   CAROTID ARTERY STENOSIS, WITHOUT INFARCTION 09/22/2008   CORONARY ARTERY DISEASE 04/03/2008   Diabetes mellitus    "borderline"   ERECTILE DYSFUNCTION 01/14/2009   GOUT 01/16/2008   HERPES ZOSTER 04/15/2010   HYPERLIPIDEMIA 01/16/2008   HYPERTENSION  01/16/2008   IMPAIRED GLUCOSE TOLERANCE 08/22/2010   Shingles 2011   left chest   Ulcer 30 yrs ago   stomach    SURGICAL HISTORY: Past Surgical History:  Procedure Laterality Date   COLONOSCOPY     3 months ago   CORONARY ARTERY BYPASS GRAFT  2009   vessels x3   EAR CYST EXCISION N/A 07/29/2013   Procedure: excision of urachal cyst flexible cystoscopy insertion of foley cath;  Surgeon: Fredricka Bonine, MD;  Location: [facility];  Service: Urology;  Laterality: N/A;   LAPAROSCOPY N/A 07/29/2013   Procedure: LAPAROSCOPY DIAGNOSTIC  laparoscopic excision of urachal cyst ;  Surgeon: Madilyn Hook, DO;  Location: [facility];  Service: General;  Laterality: N/A;   LAPAROTOMY  08/25/2012   Procedure: EXPLORATORY LAPAROTOMY;  Surgeon: Madilyn Hook, DO;  Location: [facility];  Service: General;  Laterality: N/A;  incision and drainage abdominal wall abcessand intra abdominal wall abcess   PARTIAL GASTRECTOMY  1974   bleeding ulcers   urachal  2014   Urachal cyst removal    SOCIAL HISTORY: Social History   Socioeconomic History   Marital status: Married    Spouse name: Not on file   Number of children: 3   Years of education: Not on file   Highest education level: Not on file  Occupational History    Employer: [employer]  Tobacco Use   Smoking status: Former    Packs/day: 1.00    Years:  10.00    Total pack years: 10.00    Types: Cigarettes    Quit date: 07/27/1999    Years since quitting: 22.9   Smokeless tobacco: Never  Substance and Sexual Activity   Alcohol use: Yes    Comment: 4 beers a day   Drug use: No   Sexual activity: Not on file  Other Topics Concern   Not on file  Social History Narrative   epworth sleepiness scale score: 4   Social Determinants of Health   Financial Resource Strain: Low Risk  (07/14/2022)   Overall Financial Resource Strain (CARDIA)    Difficulty of Paying Living Expenses: Not hard at all  Food Insecurity: No Food Insecurity (07/14/2022)    Hunger Vital Sign    Worried About Running Out of Food in the Last Year: Never true    Ran Out of Food in the Last Year: Never true  Transportation Needs: No Transportation Needs (07/14/2022)   PRAPARE - Hydrologist (Medical): No    Lack of Transportation (Non-Medical): No  Physical Activity: Insufficiently Active (07/14/2022)   Exercise Vital Sign    Days of Exercise per Week: 3 days    Minutes of Exercise per Session: 20 min  Stress: No Stress Concern Present (07/14/2022)   [place]    Feeling of Stress : Not at all  Social Connections: [place] (07/14/2022)   Social Connection and Isolation Panel [NHANES]    Frequency of Communication with Friends and Family: More than three times a week    Frequency of Social Gatherings with Friends and Family: More than three times a week    Attends Religious Services: More than 4 times per year    Active Member of [employer] or Organizations: Yes    Attends Music therapist: More than 4 times per year    Marital Status: Married  Human resources officer Violence: Not At Risk (07/14/2022)   Humiliation, Afraid, Rape, and Kick questionnaire    Fear of Current or Ex-Partner: No    Emotionally Abused: No    Physically Abused: No    Sexually Abused: No    FAMILY HISTORY: Family History  Problem Relation Age of Onset   Heart disease Mother    Heart disease Sister    Colon cancer Neg Hx     ALLERGIES:  has No Known Allergies.  MEDICATIONS:  Current Outpatient Medications  Medication Sig Dispense Refill   allopurinol (ZYLOPRIM) 100 MG tablet TAKE 1 TABLET BY MOUTH  DAILY (Patient taking differently: Take 100 mg by mouth daily.) 90 tablet 3   amLODipine (NORVASC) 10 MG tablet TAKE 1 TABLET BY MOUTH  DAILY 90 tablet 1   atorvastatin (LIPITOR) 80 MG tablet TAKE 1 TABLET BY MOUTH EVERY MORNING. (Patient taking differently: Take 80 mg by mouth  daily.) 90 tablet 3   colchicine 0.6 MG tablet Take 2 tabs (1.33m) at fist sign of gout flare.  Then take 1 tab 1 hour later.  On day 2 take one tab daily until flare stops. 20 tablet 3   Continuous Blood Gluc Receiver (FREESTYLE LIBRE 14 DAY READER) DEVI Use as directed 1 each 0   Continuous Blood Gluc Receiver (FREESTYLE LIBRE READER) DEVI Use with Freestyle Sensor to check blood sugar levels. 1 each 0   Continuous Blood Gluc Sensor (FREESTYLE LIBRE SENSOR SYSTEM) MISC Apply device as directed every 2 weeks. 2 each 11   gabapentin (NEURONTIN)  100 MG capsule Take 1 capsule (100 mg total) by mouth at bedtime as needed (pain). 90 capsule 1   glucose blood (ONE TOUCH ULTRA TEST) test strip 1 each by Other route daily. Use as instructed 100 each 12   Lancets (ONETOUCH ULTRASOFT) lancets 1 each by Other route daily. Use as instructed 100 each 12   lisinopril (ZESTRIL) 40 MG tablet Take 1 tablet (40 mg total) by mouth daily. 90 tablet 2   LOKELMA 10 g PACK packet Take 1 packet by mouth daily.     metoprolol tartrate (LOPRESSOR) 25 MG tablet Take 0.5 tablets (12.5 mg total) by mouth 2 (two) times daily. 90 tablet 3   nitroGLYCERIN (NITROSTAT) 0.4 MG SL tablet Place 1 tablet (0.4 mg total) under the tongue every 5 (five) minutes as needed for chest pain. 15 tablet 5   sildenafil (VIAGRA) 25 MG tablet Take 1 tablet (25 mg total) by mouth daily as needed for erectile dysfunction. 10 tablet 1   tamsulosin (FLOMAX) 0.4 MG CAPS capsule TAKE 1 CAPSULE BY MOUTH EVERY DAY 90 capsule 1   traMADol (ULTRAM) 50 MG tablet TAKE 2 TABLETS BY MOUTH EVERY 12 HOURS AS NEEDED. 120 tablet 0   No current facility-administered medications for this visit.    REVIEW OF SYSTEMS:   Constitutional: ( - ) fevers, ( - )  chills , ( - ) night sweats Eyes: ( - ) blurriness of vision, ( - ) double vision, ( - ) watery eyes Ears, nose, mouth, throat, and face: ( - ) mucositis, ( - ) sore throat Respiratory: ( - ) cough, ( - )  dyspnea, ( - ) wheezes Cardiovascular: ( - ) palpitation, ( - ) chest discomfort, ( - ) lower extremity swelling Gastrointestinal:  ( - ) nausea, ( - ) heartburn, ( - ) change in bowel habits Skin: ( - ) abnormal skin rashes Lymphatics: ( - ) new lymphadenopathy, ( - ) easy bruising Neurological: ( - ) numbness, ( - ) tingling, ( - ) new weaknesses Behavioral/Psych: ( - ) mood change, ( - ) new changes  All other systems were reviewed with the patient and are negative.  PHYSICAL EXAMINATION:  Vitals:   07/20/22 1141  BP: (!) 146/67  Pulse: 75  Resp: 16  Temp: 98.1 F (36.7 C)  SpO2: 95%    Filed Weights   07/20/22 1141  Weight: 272 lb 14.4 oz (123.8 kg)     GENERAL: Well-appearing elderly male, alert, no distress and comfortable SKIN: skin color, texture, turgor are normal, no rashes or significant lesions EYES: conjunctiva are pink and non-injected, sclera clear LUNGS: clear to auscultation and percussion with normal breathing effort HEART: regular rate & rhythm and no murmurs and no lower extremity edema Musculoskeletal: no cyanosis of digits and no clubbing  PSYCH: alert & oriented x 3, fluent speech NEURO: no focal motor/sensory deficits  LABORATORY DATA:  I have reviewed the data as listed    Latest Ref Rng & Units 07/20/2022   11:12 AM 12/29/2021    7:33 AM 11/23/2021    2:16 PM  CBC  WBC 4.0 - 10.5 K/uL 6.6  5.5  6.0   Hemoglobin 13.0 - 17.0 g/dL 11.3  10.6  11.0   Hematocrit 39.0 - 52.0 % 34.9  33.3  34.0   Platelets 150 - 400 K/uL 221  213  222        Latest Ref Rng & Units 07/20/2022   11:12 AM 11/23/2021  2:16 PM 10/26/2021    4:47 AM  CMP  Glucose 70 - 99 mg/dL 126  117  147   BUN 8 - 23 mg/dL 47  18  20   Creatinine 0.61 - 1.24 mg/dL 2.18  1.31  1.29   Sodium 135 - 145 mmol/L 136  136  136   Potassium 3.5 - 5.1 mmol/L 5.4  4.9  5.3   Chloride 98 - 111 mmol/L 111  107  113   CO2 22 - 32 mmol/L '20  25  19   ' Calcium 8.9 - 10.3 mg/dL 9.4  9.3  9.1    Total Protein 6.5 - 8.1 g/dL 7.3  7.6    Total Bilirubin 0.3 - 1.2 mg/dL 0.4  0.7    Alkaline Phos 38 - 126 U/L 81  105    AST 15 - 41 U/L 27  20    ALT 0 - 44 U/L 17  18      Lab Results  Component Value Date   MPROTEIN 0.8 (H) 11/23/2021   Lab Results  Component Value Date   KPAFRELGTCHN 96.0 (H) 11/23/2021   LAMBDASER 33.0 (H) 11/23/2021   KAPLAMBRATIO 9.47 11/28/2021   KAPLAMBRATIO 2.91 (H) 11/23/2021    RADIOGRAPHIC STUDIES: No results found.  ASSESSMENT & PLAN [name] 76 y.o. male with medical history significant for an IgA kappa monoclonal gammopathy who presents for a follow up visit.   Monoclonal Gammopathies are a group of medical conditions defined by the presence of a monoclonal protein (an M protein) in the blood or urine. Monoclonal gammopathies include monoclonal gammopathy of unknown significance (MGUS), Monoclonal gammopathies of renal or neurological significance, smolder multiple myeloma (SMM), multiple myeloma (MM), AL amyloidosis, and Waldenstrom macroglobulinemia. The goal of the initial workup is to determine which monoclonal gammopathy a patient has. The workup consists of evaluating protein in the serum (with serum protein electrophoresis (SPEP) and serum free light chains) , evaluating protein in the urine (UPEP), and evaluation of the skeleton (DG Bone Met Survey) to assure no lytic lesions. Baseline bloodwork includes CMP and CBC. If no CRAB criteria or high risk criteria are noted then the diagnosis is MGUS. MGUS must be followed with bloodwork periodically to assure it does not convert to multiple myeloma (occurs to approximately 1% of patients per year). If there are CRAB criteria or high risk features (such as elevated serum free light chain ratio (taking into account renal function), a non IgG M protein, or M protein >1.5) then a bone marrow biopsy must be pursued.  # IgA kappa Monoclonal Gammopathy of Undetermined Significance -- Initial  findings concerning for a known IgG monoclonal gammopathy with chronic kidney disease.   --Bone marrow biopsy on 12/29/2021 showed 5% plasmacytosis with no evidence of a plasma cell neoplasm Plan:  -- At each visit will order an SPEP, SFLC, CBC, CMP, and LDH --recommend a metastatic bone survey to assess for lytic lesions and UPEP at least once yearly --labs today show creatinine 2.18, white blood cell 6.6, hemoglobin 0.3, and platelets of 221 --With his bump in creatinine we will reach out to his primary nephrologist.  Encouraged the patient to do the same. -- Return to clinic in 6 months time for further evaluation   No orders of the defined types were placed in this encounter.   All questions were answered. The patient knows to call the clinic with any problems, questions or concerns.  A total of more than 25 minutes were  spent on this encounter with face-to-face time and non-face-to-face time, including preparing to see the patient, ordering tests and/or medications, counseling the patient and coordination of care as outlined above.   Peter Peoples, MD Department of Hematology/Oncology [place] [facility] Phone: [phone] Pager: [phone] Email: [email or web address]  07/20/2022 5:47 PM   Literature Support:  1) Kyle RA, Durie BG, Rajkumar SV, et al. Monoclonal gammopathy of undetermined significance (MGUS) and smoldering (asymptomatic) multiple myeloma: IMWG consensus perspectives risk factors for progression and guidelines for monitoring and management. Leukemia. 2010;24(6):1121-1127. doi:10.1038/leu.2010.60   --If a patient with apparent MGUS has a serum monoclonal protein >15 g/l, IgA or IgM protein type, or an abnormal FLC ratio, a BM aspirate and biopsy should be carried out at baseline to rule out underlying PC malignancy.   2) Marylyn Ishihara RA, Therneau TM, Rajkumar SV, et al. Prevalence of monoclonal gammopathy of undetermined significance.  N Engl J Med [id number]   --MGUS was found in 3.2 percent of persons 100 years of age or older and 5.3 percent of persons 41 years of age or older.

## 2022-07-21 LAB — KAPPA/LAMBDA LIGHT CHAINS
Kappa free light chain: 120.7 mg/L — ABNORMAL HIGH (ref 3.3–19.4)
Kappa, lambda light chain ratio: 2.83 — ABNORMAL HIGH (ref 0.26–1.65)
Lambda free light chains: 42.7 mg/L — ABNORMAL HIGH (ref 5.7–26.3)

## 2022-07-21 LAB — BETA 2 MICROGLOBULIN, SERUM: Beta-2 Microglobulin: 3.9 mg/L — ABNORMAL HIGH (ref 0.6–2.4)

## 2022-07-22 ENCOUNTER — Other Ambulatory Visit: Payer: Self-pay | Admitting: Family Medicine

## 2022-07-22 DIAGNOSIS — E785 Hyperlipidemia, unspecified: Secondary | ICD-10-CM

## 2022-07-25 ENCOUNTER — Encounter: Payer: Self-pay | Admitting: Gastroenterology

## 2022-07-26 LAB — MULTIPLE MYELOMA PANEL, SERUM
Albumin SerPl Elph-Mcnc: 3.2 g/dL (ref 2.9–4.4)
Albumin/Glob SerPl: 0.9 (ref 0.7–1.7)
Alpha 1: 0.2 g/dL (ref 0.0–0.4)
Alpha2 Glob SerPl Elph-Mcnc: 0.8 g/dL (ref 0.4–1.0)
B-Globulin SerPl Elph-Mcnc: 1.6 g/dL — ABNORMAL HIGH (ref 0.7–1.3)
Gamma Glob SerPl Elph-Mcnc: 1.2 g/dL (ref 0.4–1.8)
Globulin, Total: 3.7 g/dL (ref 2.2–3.9)
IgA: 1025 mg/dL — ABNORMAL HIGH (ref 61–437)
IgG (Immunoglobin G), Serum: 1348 mg/dL (ref 603–1613)
IgM (Immunoglobulin M), Srm: 27 mg/dL (ref 15–143)
M Protein SerPl Elph-Mcnc: 0.6 g/dL — ABNORMAL HIGH
Total Protein ELP: 6.9 g/dL (ref 6.0–8.5)

## 2022-07-28 ENCOUNTER — Telehealth: Payer: Self-pay

## 2022-07-28 NOTE — Telephone Encounter (Addendum)
Called and spoke with patient to advise that his creatinine had increased to 2.8 mg/dl. Patient states that he has a nephrologist but could not recall the name during the conversation. He will call them to let them know that his creatinine is elevated and will schedule apt for eval.  I will also my chart this information to pt per his request.  ----- Message from Otila Kluver, RN sent at 07/26/2022  3:22 PM EDT ----- Regarding: FW: P1  ----- Message ----- From: Orson Slick, MD Sent: 07/20/2022   5:47 PM EDT To: Otila Kluver, RN Subject: P1                                             Please let Mr. Kenton know that his Cr. Has incrased to 2.18. This is higher than usual. I believe he is followed by nephrology. Please find out which nephrologist he sees. Additionally recommend he reach out to their office.    ----- Message ----- From: Buel Ream, Lab [facility] [place] Sent: 07/20/2022  11:37 AM EDT To: Orson Slick, MD

## 2022-08-09 ENCOUNTER — Other Ambulatory Visit: Payer: Self-pay | Admitting: Family Medicine

## 2022-08-09 DIAGNOSIS — M4726 Other spondylosis with radiculopathy, lumbar region: Secondary | ICD-10-CM

## 2022-08-14 ENCOUNTER — Other Ambulatory Visit: Payer: Self-pay | Admitting: Family Medicine

## 2022-08-23 ENCOUNTER — Telehealth: Payer: Self-pay | Admitting: Family Medicine

## 2022-08-23 ENCOUNTER — Other Ambulatory Visit: Payer: Self-pay | Admitting: Family Medicine

## 2022-08-23 DIAGNOSIS — M4726 Other spondylosis with radiculopathy, lumbar region: Secondary | ICD-10-CM

## 2022-08-23 MED ORDER — TRAMADOL HCL 50 MG PO TABS: ORAL_TABLET | ORAL | 1 refills | Status: DC

## 2022-08-23 NOTE — Telephone Encounter (Signed)
Called Pt to schedule an OV. No answer. Lvm for Pt to call back to make an appt.

## 2022-08-23 NOTE — Telephone Encounter (Signed)
Pt's spouse called to say the pharmacy is telling her they are still waiting to hear from MD to refill the Rx for the traMADol (ULTRAM) 50 MG tablet  LOV:  10/28/21  [id number]- [place], [place] - [address] Phone:  [phone] Fax:  [phone]

## 2022-08-24 ENCOUNTER — Ambulatory Visit (INDEPENDENT_AMBULATORY_CARE_PROVIDER_SITE_OTHER): Payer: BC Managed Care – PPO | Admitting: Family Medicine

## 2022-08-24 VITALS — BP 130/64 | HR 67 | Temp 98.1°F | Wt 278.8 lb

## 2022-08-24 DIAGNOSIS — M4726 Other spondylosis with radiculopathy, lumbar region: Secondary | ICD-10-CM

## 2022-08-24 DIAGNOSIS — D472 Monoclonal gammopathy: Secondary | ICD-10-CM | POA: Diagnosis not present

## 2022-08-24 DIAGNOSIS — Z23 Encounter for immunization: Secondary | ICD-10-CM | POA: Diagnosis not present

## 2022-08-24 DIAGNOSIS — E1121 Type 2 diabetes mellitus with diabetic nephropathy: Secondary | ICD-10-CM

## 2022-08-24 DIAGNOSIS — I10 Essential (primary) hypertension: Secondary | ICD-10-CM | POA: Diagnosis not present

## 2022-08-24 LAB — POCT GLYCOSYLATED HEMOGLOBIN (HGB A1C): Hemoglobin A1C: 5.9 % — AB (ref 4.0–5.6)

## 2022-08-24 MED ORDER — TRAMADOL HCL 50 MG PO TABS: 50.0000 mg | ORAL_TABLET | Freq: Two times a day (BID) | ORAL | 1 refills | Status: DC | PRN

## 2022-08-24 NOTE — Progress Notes (Addendum)
Subjective:    Patient ID: [name], male    DOB: [birth year], 76 y.o.   MRN: [id number]  Chief Complaint  Patient presents with   Medication Refill    HPI Patient is a 76 yo male DM II, OA, MGUS, CAD s/p bypass, HTN, CKD 3 who was seen today for f/u.   Pt states he has been doing well overall.  Using freestyle libre gcm, but having difficulty with putting a new sensor on.  Has sensor with him.  States bs has been good, 120s-130s.  States having less back pain from OA.  May take one tramadol.  Followed by Heme/onc for MGUS.  Interested in flu shot.   Past Medical History:  Diagnosis Date   CAD, ARTERY BYPASS GRAFT 02/10/2008   CAROTID ARTERY STENOSIS, WITHOUT INFARCTION 09/22/2008   CORONARY ARTERY DISEASE 04/03/2008   Diabetes mellitus    "borderline"   ERECTILE DYSFUNCTION 01/14/2009   GOUT 01/16/2008   HERPES ZOSTER 04/15/2010   HYPERLIPIDEMIA 01/16/2008   HYPERTENSION 01/16/2008   IMPAIRED GLUCOSE TOLERANCE 08/22/2010   Shingles 2011   left chest   Ulcer 30 yrs ago   stomach    No Known Allergies  ROS General: Denies fever, chills, night sweats, changes in weight, changes in appetite HEENT: Denies headaches, ear pain, changes in vision, rhinorrhea, sore throat CV: Denies CP, palpitations, SOB, orthopnea Pulm: Denies SOB, cough, wheezing GI: Denies abdominal pain, nausea, vomiting, diarrhea, constipation GU: Denies dysuria, hematuria, frequency Msk: Denies muscle cramps, joint pains  +low back pain-improving Neuro: Denies weakness, numbness, tingling Skin: Denies rashes, bruising Psych: Denies depression, anxiety, hallucinations  Objective:    Blood pressure 130/64, pulse 67, temperature 98.1 F (36.7 C), temperature source Oral, weight 278 lb 12.8 oz (126.5 kg), SpO2 95 %.  Gen. Pleasant, well-nourished, in no distress, normal affect   HEENT: [place]/AT, face symmetric, conjunctiva clear, no scleral icterus, PERRLA, EOMI, nares patent without drainage Lungs: no  accessory muscle use, CTAB, no wheezes or rales Cardiovascular: RRR, no m/r/g, no peripheral edema Neuro:  A&Ox3, CN II-XII intact, normal gait Skin:  Warm, no lesions/ rash   Wt Readings from Last 3 Encounters:  08/24/22 278 lb 12.8 oz (126.5 kg)  07/20/22 272 lb 14.4 oz (123.8 kg)  07/14/22 282 lb (127.9 kg)    Lab Results  Component Value Date   WBC 6.6 07/20/2022   HGB 11.3 (L) 07/20/2022   HCT 34.9 (L) 07/20/2022   PLT 221 07/20/2022   GLUCOSE 126 (H) 07/20/2022   CHOL 115 10/24/2021   TRIG 42 10/24/2021   HDL 63 10/24/2021   LDLDIRECT 130.4 11/06/2011   LDLCALC 44 10/24/2021   ALT 17 07/20/2022   AST 27 07/20/2022   NA 136 07/20/2022   K 5.4 (H) 07/20/2022   CL 111 07/20/2022   CREATININE 2.18 (H) 07/20/2022   BUN 47 (H) 07/20/2022   CO2 20 (L) 07/20/2022   TSH 2.31 07/21/2016   PSA 0.17 08/05/2020   INR 1.5 02/10/2008   HGBA1C 5.8 (H) 10/24/2021   MICROALBUR 75.9 08/18/2020    Assessment/Plan:  Controlled type 2 diabetes mellitus with diabetic nephropathy, without long-term current use of insulin (HCC)  -Hgb A1C 5.8% on 10/24/21 -Currently diet controlled.   -Previously on glipizide which was DC'd 2/2 hypoglycemia during hospitalization 10/2021. -Continue life modifications -Continue lisinopril 40 mg and Lipitor 80 mg daily -Reviewed GCM use and application.  Sensor patient brought to clinic missing component.  Given sample sensor. - Plan:  POC HgB A1c  Osteoarthritis of spine with radiculopathy, lumbar region -as requiring less tramadol, new rx sent to pharmacy  - Plan: traMADol (ULTRAM) 50 MG tablet  MGUS (monoclonal gammopathy of unknown significance) -stable -IgA kappa elevation 10/05/21 -bone marrow bxp 12/29/21 c/w MGUS -continue f/u with Oncology  Need for immunization against influenza  - Plan: Flu Vaccine QUAD High Dose(Fluad)  Essential hypertension -Controlled -Continue metoprolol tartrate 12.5 mg twice daily, Norvasc 10 mg daily, and  lisinopril 40 mg daily.  F/u prn in 4-6 months, sooner if needed  Grier Mitts, MD

## 2022-08-24 NOTE — Patient Instructions (Signed)
Hgb A1C was 5.9% this visit.

## 2022-08-24 NOTE — Telephone Encounter (Signed)
Pt was seen in office today

## 2022-09-10 ENCOUNTER — Encounter: Payer: Self-pay | Admitting: Family Medicine

## 2022-09-10 NOTE — Addendum Note (Signed)
Addended by: Grier Mitts R on: 09/10/2022 10:25 PM   Modules accepted: Level of Service

## 2022-09-18 DIAGNOSIS — N183 Chronic kidney disease, stage 3 unspecified: Secondary | ICD-10-CM | POA: Diagnosis not present

## 2022-09-26 ENCOUNTER — Other Ambulatory Visit: Payer: Self-pay | Admitting: Family Medicine

## 2022-09-26 DIAGNOSIS — M5441 Lumbago with sciatica, right side: Secondary | ICD-10-CM

## 2022-09-27 DIAGNOSIS — N183 Chronic kidney disease, stage 3 unspecified: Secondary | ICD-10-CM | POA: Diagnosis not present

## 2022-09-27 DIAGNOSIS — D631 Anemia in chronic kidney disease: Secondary | ICD-10-CM | POA: Diagnosis not present

## 2022-09-27 DIAGNOSIS — I129 Hypertensive chronic kidney disease with stage 1 through stage 4 chronic kidney disease, or unspecified chronic kidney disease: Secondary | ICD-10-CM | POA: Diagnosis not present

## 2022-09-27 DIAGNOSIS — R809 Proteinuria, unspecified: Secondary | ICD-10-CM | POA: Diagnosis not present

## 2022-10-01 ENCOUNTER — Other Ambulatory Visit: Payer: Self-pay | Admitting: Family Medicine

## 2022-10-02 NOTE — Progress Notes (Unsigned)
10/04/2022 [name] [id number] Aug 08, 1946  Referring provider: [name], MD Primary GI doctor: [name]  ASSESSMENT AND PLAN:   History of colonic polyps 2013 colonoscopy with hyperplastic polyps, no family history, no changes in Bms.  We had a long discussion about the purpose of colon cancer screening and the different methods, including FIT test, Cologuard, virtual colonoscopy, and colonoscopy, including the benefits and their inherent limitations.  With shared decision making we have decided to proceed with the colonoscopy We have discussed the risks of bleeding, infection, perforation, medication reactions,  and remote risk of death associated with colonoscopy.  All questions were answered and the patient acknowledges these risk and wishes to proceed.  Normocytic anemia with history of alcohol abuse Normal platelets, normal LFTS Counseled on ETOH use Likely anemia is from CKD and monoclonal gammopathy but will get iron/ferritin to evaluate.  If low will add on EGD to colonoscopy.  Monitor LFTS/platlets, patient high risk for MASH due to metabolic risk factors with ETOH.  Diverticulosis of colon without hemorrhage Will call if any symptoms. Add on fiber supplement, avoid NSAIDS, information given  Atherosclerosis of native coronary artery of native heart without angina pectoris On bASA, s/p CABG 2009, no chest pain or shortness of breath  Type 2 diabetes mellitus with peripheral vascular disease (HCC) Last A1C 5.9, well controlled    Patient Care Team: [name], MD as PCP - General (Family Medicine) [name], [name] as Referring Physician (Optometry)  HISTORY OF PRESENT ILLNESS: 76 y.o. male with a past medical history of DM2, CAD s/p CABG 2009, PAD, CKD stage 3a, and others listed below presents for evaluation of colonoscopy.  History of partial gastrectomy in 1970s.  IgA kappa monoclonal gammopathy with associated CKD, established  care with Dr. Lorenso Wagner on 11/23/2021. 06/24/2012 colonoscopy Dr. Sharlett Iles for screening sessile polyp 3 to 5 mm in size, multiple flat polyps 3 to 5 mm size rectum, diverticulosis, pathology showed all hyperplastic polyps, recall 10 years.   He is on bASA, no blood thinners. No chest pain, no SOB with exercise/walking. Patient denies family history of colon cancer or other gastrointestinal malignancies. Patient has a BM every every day.  Patient denies change in bowel habits, constipation, diarrhea, hematochezia.  Denies changes in appetite, unintentional weight loss.   Patient denies GERD regularly has occ related to food like BBQ or eating too much.  He has seen intermittent dark stools associated with pepto.  Patient denies dysphagia, nausea, vomiting. He drinks beer daily 4-6 a day. No NSAIDS. No tobacco use.  Normal platelets, no leg swelling.   CBC  07/20/2022  HGB 11.3 MCV 98.6 with normocytic anemia WBC 6.6 Platelets 221 04/11/2021  B12 710 Kidney function 07/20/2022  BUN 47 Cr 2.18  GFR 31  Potassium 5.4   LFTs 07/20/2022  AST 27 ALT 17 Alkphos 81 TBili 0.4   He  reports that he quit smoking about 23 years ago. His smoking use included cigarettes. He has a 10.00 pack-year smoking history. He has never used smokeless tobacco. He reports current alcohol use. He reports that he does not use drugs.  Current Medications:    Current Outpatient Medications (Cardiovascular):    amLODipine (NORVASC) 10 MG tablet, TAKE 1 TABLET BY MOUTH DAILY   atorvastatin (LIPITOR) 80 MG tablet, TAKE 1 TABLET BY MOUTH EVERY DAY IN THE MORNING   lisinopril (ZESTRIL) 40 MG tablet, Take 1 tablet (40 mg total) by mouth daily.   metoprolol  tartrate (LOPRESSOR) 25 MG tablet, Take 0.5 tablets (12.5 mg total) by mouth 2 (two) times daily.   nitroGLYCERIN (NITROSTAT) 0.4 MG SL tablet, Place 1 tablet (0.4 mg total) under the tongue every 5 (five) minutes as needed for chest pain.   sildenafil (VIAGRA) 25 MG  tablet, Take 1 tablet (25 mg total) by mouth daily as needed for erectile dysfunction.   Current Outpatient Medications (Analgesics):    allopurinol (ZYLOPRIM) 100 MG tablet, TAKE 1 TABLET BY MOUTH  DAILY   colchicine 0.6 MG tablet, Take 2 tabs (1.'2mg'$ ) at fist sign of gout flare.  Then take 1 tab 1 hour later.  On day 2 take one tab daily until flare stops.   traMADol (ULTRAM) 50 MG tablet, Take 1 tablet (50 mg total) by mouth every 12 (twelve) hours as needed. TAKE 2 TABLETS BY MOUTH EVERY 12 HOURS AS NEEDED   Current Outpatient Medications (Other):    Continuous Blood Gluc Receiver (FREESTYLE LIBRE 14 DAY READER) DEVI, Use as directed   Continuous Blood Gluc Receiver (FREESTYLE LIBRE READER) DEVI, Use with Freestyle Sensor to check blood sugar levels.   Continuous Blood Gluc Sensor (FREESTYLE LIBRE SENSOR SYSTEM) MISC, Apply device as directed every 2 weeks.   gabapentin (NEURONTIN) 100 MG capsule, TAKE 1 CAPSULE (100 MG TOTAL) BY MOUTH AT BEDTIME AS NEEDED (PAIN).   glucose blood (ONE TOUCH ULTRA TEST) test strip, 1 each by Other route daily. Use as instructed   Lancets (ONETOUCH ULTRASOFT) lancets, 1 each by Other route daily. Use as instructed   LOKELMA 10 g PACK packet, Take 1 packet by mouth daily.   tamsulosin (FLOMAX) 0.4 MG CAPS capsule, TAKE 1 CAPSULE BY MOUTH EVERY DAY  Medical History:  Past Medical History:  Diagnosis Date   CAD, ARTERY BYPASS GRAFT 02/10/2008   CAROTID ARTERY STENOSIS, WITHOUT INFARCTION 09/22/2008   CORONARY ARTERY DISEASE 04/03/2008   Diabetes mellitus    "borderline"   ERECTILE DYSFUNCTION 01/14/2009   GOUT 01/16/2008   HERPES ZOSTER 04/15/2010   HYPERLIPIDEMIA 01/16/2008   HYPERTENSION 01/16/2008   IMPAIRED GLUCOSE TOLERANCE 08/22/2010   Shingles 2011   left chest   Ulcer 30 yrs ago   stomach   Allergies: No Known Allergies   Surgical History:  He  has a past surgical history that includes Partial gastrectomy (1974); Coronary artery bypass graft  (2009); laparotomy (08/25/2012); Colonoscopy; laparoscopy (N/A, 07/29/2013); Ear Cyst Excision (N/A, 07/29/2013); and urachal (2014). Family History:  His family history includes Heart disease in his mother and sister.  REVIEW OF SYSTEMS  : All other systems reviewed and negative except where noted in the History of Present Illness.  PHYSICAL EXAM: BP 136/80 (BP Location: Left Arm, Patient Position: Sitting)   Pulse 77   Ht '5\' 11"'$  (1.803 m)   Wt 281 lb 12.8 oz (127.8 kg)   SpO2 96%   BMI 39.30 kg/m  General:   Pleasant, well developed male in no acute distress Head:   Normocephalic and atraumatic. Eyes:  sclerae anicteric,conjunctive pink  Heart:   regular rate and rhythm Pulm:  Clear anteriorly; no wheezing Abdomen:   Soft, Obese AB, Active bowel sounds. No tenderness . Without guarding and Without rebound, No organomegaly appreciated. Rectal: Not evaluated Extremities:  Without edema. Msk: Symmetrical without gross deformities. Peripheral pulses intact.  Neurologic:  Alert and  oriented x4;  No focal deficits.  Skin:   Dry and intact without significant lesions or rashes. Psychiatric:  Cooperative. Normal mood and affect.  Vladimir Crofts, PA-C 11:25 AM

## 2022-10-04 ENCOUNTER — Encounter: Payer: Self-pay | Admitting: Physician Assistant

## 2022-10-04 ENCOUNTER — Ambulatory Visit (INDEPENDENT_AMBULATORY_CARE_PROVIDER_SITE_OTHER): Payer: BC Managed Care – PPO | Admitting: Physician Assistant

## 2022-10-04 ENCOUNTER — Other Ambulatory Visit (INDEPENDENT_AMBULATORY_CARE_PROVIDER_SITE_OTHER): Payer: BC Managed Care – PPO

## 2022-10-04 VITALS — BP 136/80 | HR 77 | Ht 71.0 in | Wt 281.8 lb

## 2022-10-04 DIAGNOSIS — I2510 Atherosclerotic heart disease of native coronary artery without angina pectoris: Secondary | ICD-10-CM | POA: Diagnosis not present

## 2022-10-04 DIAGNOSIS — F1010 Alcohol abuse, uncomplicated: Secondary | ICD-10-CM

## 2022-10-04 DIAGNOSIS — K5730 Diverticulosis of large intestine without perforation or abscess without bleeding: Secondary | ICD-10-CM | POA: Diagnosis not present

## 2022-10-04 DIAGNOSIS — Z86010 Personal history of colonic polyps: Secondary | ICD-10-CM | POA: Diagnosis not present

## 2022-10-04 DIAGNOSIS — D649 Anemia, unspecified: Secondary | ICD-10-CM

## 2022-10-04 DIAGNOSIS — E1151 Type 2 diabetes mellitus with diabetic peripheral angiopathy without gangrene: Secondary | ICD-10-CM | POA: Diagnosis not present

## 2022-10-04 LAB — IBC + FERRITIN
Ferritin: 21.7 ng/mL — ABNORMAL LOW (ref 22.0–322.0)
Iron: 80 ug/dL (ref 42–165)
Saturation Ratios: 22.8 % (ref 20.0–50.0)
TIBC: 351.4 ug/dL (ref 250.0–450.0)
Transferrin: 251 mg/dL (ref 212.0–360.0)

## 2022-10-04 MED ORDER — NA SULFATE-K SULFATE-MG SULF 17.5-3.13-1.6 GM/177ML PO SOLN: 1.0000 | ORAL | 0 refills | Status: DC

## 2022-10-04 NOTE — Patient Instructions (Addendum)
Your provider has requested that you go to the basement level for lab work before leaving today. Press "B" on the elevator. The lab is located at the first door on the left as you exit the elevator.  We have sent the following medications to your pharmacy for you to pick up at your convenience: Suprep   Diverticulosis Diverticulosis is a condition that develops when small pouches (diverticula) form in the wall of the large intestine (colon). The colon is where water is absorbed and stool (feces) is formed. The pouches form when the inside layer of the colon pushes through weak spots in the outer layers of the colon. You may have a few pouches or many of them. The pouches usually do not cause problems unless they become inflamed or infected. When this happens, the condition is called diverticulitis- this is left lower quadrant pain, diarrhea, fever, chills, nausea or vomiting.  If this occurs please call the office or go to the hospital. Sometimes these patches without inflammation can also have painless bleeding associated with them, if this happens please call the office or go to the hospital. Preventing constipation and increasing fiber can help reduce diverticula and prevent complications. Even if you feel you have a high-fiber diet, suggest getting on Benefiber or Cirtracel 2 times daily.  Thank you for choosing me and [facility].  Vicie Mutters PA

## 2022-10-12 ENCOUNTER — Encounter: Payer: Self-pay | Admitting: Gastroenterology

## 2022-10-12 ENCOUNTER — Ambulatory Visit (AMBULATORY_SURGERY_CENTER): Payer: BC Managed Care – PPO | Admitting: Gastroenterology

## 2022-10-12 VITALS — BP 153/89 | HR 67 | Temp 98.0°F | Resp 13 | Ht 71.0 in | Wt 281.0 lb

## 2022-10-12 DIAGNOSIS — Z09 Encounter for follow-up examination after completed treatment for conditions other than malignant neoplasm: Secondary | ICD-10-CM | POA: Diagnosis not present

## 2022-10-12 DIAGNOSIS — D123 Benign neoplasm of transverse colon: Secondary | ICD-10-CM | POA: Diagnosis not present

## 2022-10-12 DIAGNOSIS — Z86010 Personal history of colonic polyps: Secondary | ICD-10-CM

## 2022-10-12 DIAGNOSIS — K635 Polyp of colon: Secondary | ICD-10-CM | POA: Diagnosis not present

## 2022-10-12 DIAGNOSIS — D125 Benign neoplasm of sigmoid colon: Secondary | ICD-10-CM

## 2022-10-12 HISTORY — PX: COLONOSCOPY: SHX174

## 2022-10-12 MED ORDER — SODIUM CHLORIDE 0.9 % IV SOLN: 500.0000 mL | Freq: Once | INTRAVENOUS | Status: DC

## 2022-10-12 NOTE — Progress Notes (Signed)
A and O x3. Report to RN. Tolerated MAC anesthesia well. 

## 2022-10-12 NOTE — Progress Notes (Signed)
Called to room to assist during endoscopic procedure.  Patient ID and intended procedure confirmed with present staff. Received instructions for my participation in the procedure from the performing physician.  

## 2022-10-12 NOTE — Progress Notes (Signed)
Indications for colonoscopy: History of colon polyps  Please see the 10/04/22 office note for complete details. There has been no change in history or physical exam. He remains an appropriate candidate for monitored anesthesia care in the [facility].

## 2022-10-12 NOTE — Patient Instructions (Addendum)
Information on polyps given to you today.  Patient has a contact number available for emergencies. The signs and symptoms of potential delayed complications were discussed with the patient. Return to normal activities tomorrow. Written discharge instructions were provided to the patient. - Continue present medications. - Await pathology results. - Repeat colonoscopy is not recommended due to current age (41 years or older) for surveillance. - Follow a high fiber diet. Drink at least 64 ounces of water daily. Add a daily stool bulking agent such as psyllium (an exampled would be Metamucil). - Emerging evidence supports eating a diet of fruits, vegetables, grains, calcium, and yogurt while reducing red meat and alcohol may reduce the risk of colon cancer. - Thank you for allowing me to be involved in your colon cancer prevention. Recommendation: [name]   YOU HAD AN ENDOSCOPIC PROCEDURE TODAY AT [facility]:   Refer to the procedure report that was given to you for any specific questions about what was found during the examination.  If the procedure report does not answer your questions, please call your gastroenterologist to clarify.  If you requested that your care partner not be given the details of your procedure findings, then the procedure report has been included in a sealed envelope for you to review at your convenience later.  YOU SHOULD EXPECT: Some feelings of bloating in the abdomen. Passage of more gas than usual.  Walking can help get rid of the air that was put into your GI tract during the procedure and reduce the bloating. If you had a lower endoscopy (such as a colonoscopy or flexible sigmoidoscopy) you may notice spotting of blood in your stool or on the toilet paper. If you underwent a bowel prep for your procedure, you may not have a normal bowel movement for a few days.  Please Note:  You might notice some irritation and congestion in your nose or  some drainage.  This is from the oxygen used during your procedure.  There is no need for concern and it should clear up in a day or so.  SYMPTOMS TO REPORT IMMEDIATELY:  Following lower endoscopy (colonoscopy or flexible sigmoidoscopy):  Excessive amounts of blood in the stool  Significant tenderness or worsening of abdominal pains  Swelling of the abdomen that is new, acute  Fever of 100F or higher   For urgent or emergent issues, a gastroenterologist can be reached at any hour by calling [phone]. Do not use MyChart messaging for urgent concerns.    DIET:  We do recommend a small meal at first, but then you may proceed to your regular diet.  Drink plenty of fluids but you should avoid alcoholic beverages for 24 hours.  ACTIVITY:  You should plan to take it easy for the rest of today and you should NOT DRIVE or use heavy machinery until tomorrow (because of the sedation medicines used during the test).    FOLLOW UP: Our staff will call the number listed on your records the next business day following your procedure.  We will call around 7:15- 8:00 am to check on you and address any questions or concerns that you may have regarding the information given to you following your procedure. If we do not reach you, we will leave a message.     If any biopsies were taken you will be contacted by phone or by letter within the next 1-3 weeks.  Please call us at [phone] if you have not heard  about the biopsies in 3 weeks.    SIGNATURES/CONFIDENTIALITY: You and/or your care partner have signed paperwork which will be entered into your electronic medical record.  These signatures attest to the fact that that the information above on your After Visit Summary has been reviewed and is understood.  Full responsibility of the confidentiality of this discharge information lies with you and/or your care-partner.

## 2022-10-12 NOTE — Progress Notes (Signed)
Reviewed.  Donte Kary L. Kanai Berrios, MD, MPH  

## 2022-10-12 NOTE — Op Note (Signed)
[facility] Patient Name: [name] Procedure Date: 10/12/2022 3:07 PM MRN: [id number] Endoscopist: Thornton Park MD, MD, [id number] Age: 76 Referring MD:  Date of Birth: 04/09/1946 Gender: Male Account #: [id number] Procedure:                Colonoscopy Indications:              Screening for colorectal malignant neoplasm                           06/24/2012 screening colonoscopy with Dr.                            Sharlett Iles: sessile polyp 3 to 5 mm in size,                            multiple flat polyps 3 to 5 mm size rectum,                            diverticulosis, pathology showed all hyperplastic                            polyps Medicines:                Monitored Anesthesia Care Procedure:                Pre-Anesthesia Assessment:                           - Prior to the procedure, a History and Physical                            was performed, and patient medications and                            allergies were reviewed. The patient's tolerance of                            previous anesthesia was also reviewed. The risks                            and benefits of the procedure and the sedation                            options and risks were discussed with the patient.                            All questions were answered, and informed consent                            was obtained. Prior Anticoagulants: The patient has                            taken no anticoagulant or antiplatelet agents. ASA  Grade Assessment: III - A patient with severe                            systemic disease. After reviewing the risks and                            benefits, the patient was deemed in satisfactory                            condition to undergo the procedure.                           After obtaining informed consent, the colonoscope                            was passed under direct vision. Throughout the                             procedure, the patient's blood pressure, pulse, and                            oxygen saturations were monitored continuously. The                            Olympus CF-HQ190L ([id number]) Colonoscope was                            introduced through the anus and advanced to the the                            cecum, identified by appendiceal orifice and                            ileocecal valve. The colonoscopy was performed with                            moderate difficulty due to a redundant colon,                            significant looping and a tortuous colon.                            Successful completion of the procedure was aided by                            changing the patient's position, withdrawing and                            reinserting the scope and applying abdominal                            pressure. The patient tolerated the procedure well.  The quality of the bowel preparation was good. The                            ileocecal valve, appendiceal orifice, and rectum                            were photographed. Scope In: 3:28:03 PM Scope Out: 2:44:01 PM Scope Withdrawal Time: 0 hours 10 minutes 42 seconds  Total Procedure Duration: 0 hours 18 minutes 38 seconds  Findings:                 Non-bleeding internal hemorrhoids were found.                           Multiple large-mouthed, medium-mouthed and                            small-mouthed diverticula were found in the sigmoid                            colon and descending colon.                           A 4 mm polyp was found in the sigmoid colon. The                            polyp was flat. The polyp was removed with a cold                            snare. Resection and retrieval were complete.                            Estimated blood loss was minimal.                           A 3 mm polyp was found in the splenic flexure. The                            polyp was sessile.  The polyp was removed with a                            cold snare. Resection and retrieval were complete.                            Estimated blood loss was minimal.                           The exam was otherwise without abnormality on                            direct and retroflexion views. Complications:            No immediate complications. Estimated Blood Loss:     Estimated blood loss was minimal. Impression:               -  Non-bleeding internal hemorrhoids.                           - Diverticulosis in the sigmoid colon and in the                            descending colon.                           - One 4 mm polyp in the sigmoid colon, removed with                            a cold snare. Resected and retrieved.                           - One 3 mm polyp at the splenic flexure, removed                            with a cold snare. Resected and retrieved.                           - The examination was otherwise normal on direct                            and retroflexion views. Recommendation:           - Patient has a contact number available for                            emergencies. The signs and symptoms of potential                            delayed complications were discussed with the                            patient. Return to normal activities tomorrow.                            Written discharge instructions were provided to the                            patient.                           - Continue present medications.                           - Await pathology results.                           - Repeat colonoscopy is not recommended due to                            current age (6 years or older) for surveillance.                           -  Follow a high fiber diet. Drink at least 64                            ounces of water daily. Add a daily stool bulking                            agent such as psyllium (an exampled would be                             Metamucil).                           - Emerging evidence supports eating a diet of                            fruits, vegetables, grains, calcium, and yogurt                            while reducing red meat and alcohol may reduce the                            risk of colon cancer.                           - Thank you for allowing me to be involved in your                            colon cancer prevention. Thornton Park MD, MD 10/12/2022 3:52:19 PM This report has been signed electronically.

## 2022-10-13 ENCOUNTER — Telehealth: Payer: Self-pay

## 2022-10-13 NOTE — Telephone Encounter (Signed)
  Follow up Call-     10/12/2022    2:56 PM  Call back number  Post procedure Call Back phone  # [phone]  Permission to leave phone message Yes     Patient questions:  Do you have a fever, pain , or abdominal swelling? No. Pain Score  0 *  Have you tolerated food without any problems? Yes.    Have you been able to return to your normal activities? Yes.    Do you have any questions about your discharge instructions: Diet   No. Medications  No. Follow up visit  No.  Do you have questions or concerns about your Care? No.  Actions: * If pain score is 4 or above: No action needed, pain <4.

## 2022-10-18 ENCOUNTER — Encounter: Payer: Self-pay | Admitting: Gastroenterology

## 2022-11-06 ENCOUNTER — Other Ambulatory Visit: Payer: Self-pay | Admitting: Family Medicine

## 2022-11-10 ENCOUNTER — Other Ambulatory Visit: Payer: Self-pay | Admitting: Family Medicine

## 2022-11-10 DIAGNOSIS — M4726 Other spondylosis with radiculopathy, lumbar region: Secondary | ICD-10-CM

## 2022-12-18 DIAGNOSIS — N183 Chronic kidney disease, stage 3 unspecified: Secondary | ICD-10-CM | POA: Diagnosis not present

## 2022-12-31 ENCOUNTER — Other Ambulatory Visit: Payer: Self-pay | Admitting: Family Medicine

## 2022-12-31 DIAGNOSIS — E785 Hyperlipidemia, unspecified: Secondary | ICD-10-CM

## 2023-01-11 ENCOUNTER — Inpatient Hospital Stay: Payer: Medicare PPO | Attending: Hematology and Oncology

## 2023-01-11 ENCOUNTER — Other Ambulatory Visit: Payer: Self-pay | Admitting: *Deleted

## 2023-01-11 ENCOUNTER — Other Ambulatory Visit: Payer: Self-pay

## 2023-01-11 DIAGNOSIS — I2510 Atherosclerotic heart disease of native coronary artery without angina pectoris: Secondary | ICD-10-CM | POA: Diagnosis not present

## 2023-01-11 DIAGNOSIS — Z79899 Other long term (current) drug therapy: Secondary | ICD-10-CM | POA: Diagnosis not present

## 2023-01-11 DIAGNOSIS — I10 Essential (primary) hypertension: Secondary | ICD-10-CM | POA: Diagnosis not present

## 2023-01-11 DIAGNOSIS — E119 Type 2 diabetes mellitus without complications: Secondary | ICD-10-CM | POA: Insufficient documentation

## 2023-01-11 DIAGNOSIS — D472 Monoclonal gammopathy: Secondary | ICD-10-CM

## 2023-01-11 DIAGNOSIS — E785 Hyperlipidemia, unspecified: Secondary | ICD-10-CM | POA: Insufficient documentation

## 2023-01-11 LAB — CBC WITH DIFFERENTIAL (CANCER CENTER ONLY)
Abs Immature Granulocytes: 0.01 K/uL (ref 0.00–0.07)
Basophils Absolute: 0 K/uL (ref 0.0–0.1)
Basophils Relative: 1 %
Eosinophils Absolute: 0.2 K/uL (ref 0.0–0.5)
Eosinophils Relative: 2 %
HCT: 35.3 % — ABNORMAL LOW (ref 39.0–52.0)
Hemoglobin: 11.6 g/dL — ABNORMAL LOW (ref 13.0–17.0)
Immature Granulocytes: 0 %
Lymphocytes Relative: 29 %
Lymphs Abs: 1.8 K/uL (ref 0.7–4.0)
MCH: 31.9 pg (ref 26.0–34.0)
MCHC: 32.9 g/dL (ref 30.0–36.0)
MCV: 97 fL (ref 80.0–100.0)
Monocytes Absolute: 0.8 K/uL (ref 0.1–1.0)
Monocytes Relative: 12 %
Neutro Abs: 3.5 K/uL (ref 1.7–7.7)
Neutrophils Relative %: 56 %
Platelet Count: 219 K/uL (ref 150–400)
RBC: 3.64 MIL/uL — ABNORMAL LOW (ref 4.22–5.81)
RDW: 13.7 % (ref 11.5–15.5)
WBC Count: 6.4 K/uL (ref 4.0–10.5)
nRBC: 0 % (ref 0.0–0.2)

## 2023-01-11 LAB — CMP (CANCER CENTER ONLY)
ALT: 19 U/L (ref 0–44)
AST: 18 U/L (ref 15–41)
Albumin: 3.7 g/dL (ref 3.5–5.0)
Alkaline Phosphatase: 78 U/L (ref 38–126)
Anion gap: 7 (ref 5–15)
BUN: 37 mg/dL — ABNORMAL HIGH (ref 8–23)
CO2: 24 mmol/L (ref 22–32)
Calcium: 9.3 mg/dL (ref 8.9–10.3)
Chloride: 105 mmol/L (ref 98–111)
Creatinine: 1.7 mg/dL — ABNORMAL HIGH (ref 0.61–1.24)
GFR, Estimated: 42 mL/min — ABNORMAL LOW (ref >60)
Glucose, Bld: 154 mg/dL — ABNORMAL HIGH (ref 70–99)
Potassium: 4.4 mmol/L (ref 3.5–5.1)
Sodium: 136 mmol/L (ref 135–145)
Total Bilirubin: 0.6 mg/dL (ref 0.3–1.2)
Total Protein: 7.7 g/dL (ref 6.5–8.1)

## 2023-01-12 LAB — BETA 2 MICROGLOBULIN, SERUM: Beta-2 Microglobulin: 4.1 mg/L — ABNORMAL HIGH (ref 0.6–2.4)

## 2023-01-12 LAB — KAPPA/LAMBDA LIGHT CHAINS
Kappa free light chain: 119.7 mg/L — ABNORMAL HIGH (ref 3.3–19.4)
Kappa, lambda light chain ratio: 3.02 — ABNORMAL HIGH (ref 0.26–1.65)
Lambda free light chains: 39.7 mg/L — ABNORMAL HIGH (ref 5.7–26.3)

## 2023-01-16 LAB — MULTIPLE MYELOMA PANEL, SERUM
Albumin SerPl Elph-Mcnc: 3.3 g/dL (ref 2.9–4.4)
Albumin/Glob SerPl: 0.9 (ref 0.7–1.7)
Alpha 1: 0.2 g/dL (ref 0.0–0.4)
Alpha2 Glob SerPl Elph-Mcnc: 0.8 g/dL (ref 0.4–1.0)
B-Globulin SerPl Elph-Mcnc: 1.7 g/dL — ABNORMAL HIGH (ref 0.7–1.3)
Gamma Glob SerPl Elph-Mcnc: 1.2 g/dL (ref 0.4–1.8)
Globulin, Total: 3.8 g/dL (ref 2.2–3.9)
IgA: 1078 mg/dL — ABNORMAL HIGH (ref 61–437)
IgG (Immunoglobin G), Serum: 1348 mg/dL (ref 603–1613)
IgM (Immunoglobulin M), Srm: 30 mg/dL (ref 15–143)
M Protein SerPl Elph-Mcnc: 0.6 g/dL — ABNORMAL HIGH
Total Protein ELP: 7.1 g/dL (ref 6.0–8.5)

## 2023-01-18 ENCOUNTER — Inpatient Hospital Stay (HOSPITAL_BASED_OUTPATIENT_CLINIC_OR_DEPARTMENT_OTHER): Payer: Medicare PPO | Admitting: Hematology and Oncology

## 2023-01-18 ENCOUNTER — Other Ambulatory Visit: Payer: Self-pay

## 2023-01-18 VITALS — BP 165/85 | HR 77 | Temp 97.3°F | Resp 16 | Wt 290.4 lb

## 2023-01-18 DIAGNOSIS — D472 Monoclonal gammopathy: Secondary | ICD-10-CM

## 2023-01-18 NOTE — Progress Notes (Signed)
[name] [phone]   [phone]  PROGRESS NOTE  Patient Care Team: [name], MD as PCP - General (Family Medicine) [name], [place] as Referring Physician (Optometry)  Hematological/Oncological History # IgA kappa Monoclonal Gammopathy  10/05/2021: evaluated by [facility]. Found to have M protein 0.6, Kappa 130, Lambda 40.3, Ratio 3.25. IFE shows an IgA Kappa monoclonal protein. Cr 2.00 11/23/2021: establish care with Dr. Lorenso Courier  12/29/2021: Bone marrow biopsy performed showed hypercellular bone marrow for age with light plasmacytosis (5%)  Interval History:  [name] 77 y.o. male with medical history significant for an IgA kappa monoclonal gammopathy who presents for a follow up visit. The patient's last visit was on 07/20/2022. In the interim since the last visit he has had no major changes in his health.  On exam today [name] notes he has been well overall interim since her last visit.  He is had no major changes in his health.  He had no ER visits or emergency room visits.  He notes no new bone or back pain.  His energy is currently about a 9 out of 10.  He notes his weight has been "up-and-down".  He is not having any fevers, chills, sweats.  He also denies any changes in his urine such as bubbling or darkening of the urine.  He is not having any overt signs of bleeding or bruising.  Overall his appetite is strong and he is eating well.  He denies any shortness of breath or chest pain.  Overall he is at his baseline level of health.  A full 10 point ROS is listed below.  MEDICAL HISTORY:  Past Medical History:  Diagnosis Date   CAD, ARTERY BYPASS GRAFT 02/10/2008   CAROTID ARTERY STENOSIS, WITHOUT INFARCTION 09/22/2008   CORONARY ARTERY DISEASE 04/03/2008   Diabetes mellitus    "borderline"   ERECTILE DYSFUNCTION 01/14/2009   GOUT 01/16/2008   HERPES ZOSTER 04/15/2010   HYPERLIPIDEMIA 01/16/2008    HYPERTENSION 01/16/2008   IMPAIRED GLUCOSE TOLERANCE 08/22/2010   Shingles 2011   left chest   Ulcer 30 yrs ago   stomach    SURGICAL HISTORY: Past Surgical History:  Procedure Laterality Date   COLONOSCOPY     3 months ago   CORONARY ARTERY BYPASS GRAFT  2009   vessels x3   EAR CYST EXCISION N/A 07/29/2013   Procedure: excision of urachal cyst flexible cystoscopy insertion of foley cath;  Surgeon: Fredricka Bonine, MD;  Location: [facility];  Service: Urology;  Laterality: N/A;   LAPAROSCOPY N/A 07/29/2013   Procedure: LAPAROSCOPY DIAGNOSTIC  laparoscopic excision of urachal cyst ;  Surgeon: Madilyn Hook, DO;  Location: [facility];  Service: General;  Laterality: N/A;   LAPAROTOMY  08/25/2012   Procedure: EXPLORATORY LAPAROTOMY;  Surgeon: Madilyn Hook, DO;  Location: [facility];  Service: General;  Laterality: N/A;  incision and drainage abdominal wall abcessand intra abdominal wall abcess   PARTIAL GASTRECTOMY  1974   bleeding ulcers   urachal  2014   Urachal cyst removal    SOCIAL HISTORY: Social History   Socioeconomic History   Marital status: Married    Spouse name: Not on file   Number of children: 3   Years of education: Not on file   Highest education level: Not on file  Occupational History    Employer: [employer]  Tobacco Use   Smoking status: Former    Packs/day: 1.00  Years: 10.00    Additional pack years: 0.00    Total pack years: 10.00    Types: Cigarettes    Quit date: 07/27/1999    Years since quitting: 23.5   Smokeless tobacco: Never  Substance and Sexual Activity   Alcohol use: Yes    Comment: 4 beers a day   Drug use: No   Sexual activity: Not on file  Other Topics Concern   Not on file  Social History Narrative   epworth sleepiness scale score: 4   Social Determinants of Health   Financial Resource Strain: Low Risk  (07/14/2022)   Overall Financial Resource Strain (CARDIA)    Difficulty of Paying Living Expenses: Not hard at all  Food  Insecurity: No Food Insecurity (07/14/2022)   Hunger Vital Sign    Worried About Running Out of Food in the Last Year: Never true    Ran Out of Food in the Last Year: Never true  Transportation Needs: No Transportation Needs (07/14/2022)   PRAPARE - Hydrologist (Medical): No    Lack of Transportation (Non-Medical): No  Physical Activity: Insufficiently Active (07/14/2022)   Exercise Vital Sign    Days of Exercise per Week: 3 days    Minutes of Exercise per Session: 20 min  Stress: No Stress Concern Present (07/14/2022)   [place]    Feeling of Stress : Not at all  Social Connections: [place] (07/14/2022)   Social Connection and Isolation Panel [NHANES]    Frequency of Communication with Friends and Family: More than three times a week    Frequency of Social Gatherings with Friends and Family: More than three times a week    Attends Religious Services: More than 4 times per year    Active Member of [employer] or Organizations: Yes    Attends Music therapist: More than 4 times per year    Marital Status: Married  Human resources officer Violence: Not At Risk (07/14/2022)   Humiliation, Afraid, Rape, and Kick questionnaire    Fear of Current or Ex-Partner: No    Emotionally Abused: No    Physically Abused: No    Sexually Abused: No    FAMILY HISTORY: Family History  Problem Relation Age of Onset   Heart disease Mother    Heart disease Sister    Colon cancer Neg Hx     ALLERGIES:  has No Known Allergies.  MEDICATIONS:  Current Outpatient Medications  Medication Sig Dispense Refill   allopurinol (ZYLOPRIM) 100 MG tablet TAKE 1 TABLET BY MOUTH  DAILY 90 tablet 3   amLODipine (NORVASC) 10 MG tablet TAKE 1 TABLET BY MOUTH DAILY 90 tablet 3   atorvastatin (LIPITOR) 80 MG tablet TAKE 1 TABLET BY MOUTH EVERY DAY IN THE MORNING 90 tablet 0   colchicine 0.6 MG tablet Take 2 tabs  (1.2mg ) at fist sign of gout flare.  Then take 1 tab 1 hour later.  On day 2 take one tab daily until flare stops. 20 tablet 3   Continuous Blood Gluc Receiver (FREESTYLE LIBRE 14 DAY READER) DEVI Use as directed 1 each 0   Continuous Blood Gluc Receiver (FREESTYLE LIBRE READER) DEVI Use with Freestyle Sensor to check blood sugar levels. 1 each 0   Continuous Blood Gluc Sensor (FREESTYLE LIBRE SENSOR SYSTEM) MISC Apply device as directed every 2 weeks. 2 each 11   gabapentin (NEURONTIN) 100 MG capsule TAKE 1 CAPSULE (100 MG  TOTAL) BY MOUTH AT BEDTIME AS NEEDED (PAIN). 90 capsule 1   glucose blood (ONE TOUCH ULTRA TEST) test strip 1 each by Other route daily. Use as instructed 100 each 12   Lancets (ONETOUCH ULTRASOFT) lancets 1 each by Other route daily. Use as instructed 100 each 12   lisinopril (ZESTRIL) 40 MG tablet TAKE 1 TABLET BY MOUTH EVERY DAY 90 tablet 1   LOKELMA 10 g PACK packet Take 1 packet by mouth daily.     metoprolol tartrate (LOPRESSOR) 25 MG tablet Take 0.5 tablets (12.5 mg total) by mouth 2 (two) times daily. 90 tablet 3   nitroGLYCERIN (NITROSTAT) 0.4 MG SL tablet Place 1 tablet (0.4 mg total) under the tongue every 5 (five) minutes as needed for chest pain. (Patient not taking: Reported on 10/12/2022) 15 tablet 5   sildenafil (VIAGRA) 25 MG tablet Take 1 tablet (25 mg total) by mouth daily as needed for erectile dysfunction. 10 tablet 1   tamsulosin (FLOMAX) 0.4 MG CAPS capsule TAKE 1 CAPSULE BY MOUTH EVERY DAY 90 capsule 1   traMADol (ULTRAM) 50 MG tablet TAKE 2 TABLETS BY MOUTH EVERY 12 HOURS AS NEEDED 120 tablet 1   No current facility-administered medications for this visit.    REVIEW OF SYSTEMS:   Constitutional: ( - ) fevers, ( - )  chills , ( - ) night sweats Eyes: ( - ) blurriness of vision, ( - ) double vision, ( - ) watery eyes Ears, nose, mouth, throat, and face: ( - ) mucositis, ( - ) sore throat Respiratory: ( - ) cough, ( - ) dyspnea, ( - )  wheezes Cardiovascular: ( - ) palpitation, ( - ) chest discomfort, ( - ) lower extremity swelling Gastrointestinal:  ( - ) nausea, ( - ) heartburn, ( - ) change in bowel habits Skin: ( - ) abnormal skin rashes Lymphatics: ( - ) new lymphadenopathy, ( - ) easy bruising Neurological: ( - ) numbness, ( - ) tingling, ( - ) new weaknesses Behavioral/Psych: ( - ) mood change, ( - ) new changes  All other systems were reviewed with the patient and are negative.  PHYSICAL EXAMINATION:  Vitals:   01/18/23 1158  BP: (!) 165/85  Pulse: 77  Resp: 16  Temp: (!) 97.3 F (36.3 C)  SpO2: 94%   Filed Weights   01/18/23 1158  Weight: 290 lb 6.4 oz (131.7 kg)    GENERAL: Well-appearing elderly male, alert, no distress and comfortable SKIN: skin color, texture, turgor are normal, no rashes or significant lesions EYES: conjunctiva are pink and non-injected, sclera clear LUNGS: clear to auscultation and percussion with normal breathing effort HEART: regular rate & rhythm and no murmurs and no lower extremity edema Musculoskeletal: no cyanosis of digits and no clubbing  PSYCH: alert & oriented x 3, fluent speech NEURO: no focal motor/sensory deficits  LABORATORY DATA:  I have reviewed the data as listed    Latest Ref Rng & Units 01/11/2023    1:13 PM 07/20/2022   11:12 AM 12/29/2021    7:33 AM  CBC  WBC 4.0 - 10.5 K/uL 6.4  6.6  5.5   Hemoglobin 13.0 - 17.0 g/dL 11.6  11.3  10.6   Hematocrit 39.0 - 52.0 % 35.3  34.9  33.3   Platelets 150 - 400 K/uL 219  221  213        Latest Ref Rng & Units 01/11/2023    1:13 PM 07/20/2022   11:12 AM  11/23/2021    2:16 PM  CMP  Glucose 70 - 99 mg/dL 154  126  117   BUN 8 - 23 mg/dL 37  47  18   Creatinine 0.61 - 1.24 mg/dL 1.70  2.18  1.31   Sodium 135 - 145 mmol/L 136  136  136   Potassium 3.5 - 5.1 mmol/L 4.4  5.4  4.9   Chloride 98 - 111 mmol/L 105  111  107   CO2 22 - 32 mmol/L 24  20  25    Calcium 8.9 - 10.3 mg/dL 9.3  9.4  9.3   Total Protein  6.5 - 8.1 g/dL 7.7  7.3  7.6   Total Bilirubin 0.3 - 1.2 mg/dL 0.6  0.4  0.7   Alkaline Phos 38 - 126 U/L 78  81  105   AST 15 - 41 U/L 18  27  20    ALT 0 - 44 U/L 19  17  18      Lab Results  Component Value Date   MPROTEIN 0.6 (H) 01/11/2023   MPROTEIN 0.6 (H) 07/20/2022   MPROTEIN 0.8 (H) 11/23/2021   Lab Results  Component Value Date   KPAFRELGTCHN 119.7 (H) 01/11/2023   KPAFRELGTCHN 120.7 (H) 07/20/2022   KPAFRELGTCHN 96.0 (H) 11/23/2021   LAMBDASER 39.7 (H) 01/11/2023   LAMBDASER 42.7 (H) 07/20/2022   LAMBDASER 33.0 (H) 11/23/2021   KAPLAMBRATIO 3.02 (H) 01/11/2023   KAPLAMBRATIO 2.83 (H) 07/20/2022   KAPLAMBRATIO 9.47 11/28/2021    RADIOGRAPHIC STUDIES: No results found.  ASSESSMENT & PLAN [name] 77 y.o. male with medical history significant for an IgA kappa monoclonal gammopathy who presents for a follow up visit.   Monoclonal Gammopathies are a group of medical conditions defined by the presence of a monoclonal protein (an M protein) in the blood or urine. Monoclonal gammopathies include monoclonal gammopathy of unknown significance (MGUS), Monoclonal gammopathies of renal or neurological significance, smolder multiple myeloma (SMM), multiple myeloma (MM), AL amyloidosis, and Waldenstrom macroglobulinemia. The goal of the initial workup is to determine which monoclonal gammopathy a patient has. The workup consists of evaluating protein in the serum (with serum protein electrophoresis (SPEP) and serum free light chains) , evaluating protein in the urine (UPEP), and evaluation of the skeleton (DG Bone Met Survey) to assure no lytic lesions. Baseline bloodwork includes CMP and CBC. If no CRAB criteria or high risk criteria are noted then the diagnosis is MGUS. MGUS must be followed with bloodwork periodically to assure it does not convert to multiple myeloma (occurs to approximately 1% of patients per year). If there are CRAB criteria or high risk features (such as  elevated serum free light chain ratio (taking into account renal function), a non IgG M protein, or M protein >1.5) then a bone marrow biopsy must be pursued.  # IgA kappa Monoclonal Gammopathy of Undetermined Significance -- Initial findings concerning for a known IgG monoclonal gammopathy with chronic kidney disease.   --Bone marrow biopsy on 12/29/2021 showed 5% plasmacytosis with no evidence of a plasma cell neoplasm Plan:  -- At each visit will order an SPEP, SFLC, CBC, CMP, and LDH --recommend a metastatic bone survey to assess for lytic lesions and UPEP at least once yearly --labs today show creatinine 1.70, white blood cell 6.4, hemoglobin 11.6, and platelets of 219 --With his bump in creatinine we will reach out to his primary nephrologist.  Encouraged the patient to do the same. -- Return to clinic in 6 months  time for further evaluation   No orders of the defined types were placed in this encounter.   All questions were answered. The patient knows to call the clinic with any problems, questions or concerns.  A total of more than 25 minutes were spent on this encounter with face-to-face time and non-face-to-face time, including preparing to see the patient, ordering tests and/or medications, counseling the patient and coordination of care as outlined above.   Ledell Peoples, MD Department of Hematology/Oncology [place] Phone: [phone] Pager: [phone] Email: [email or web address]  01/21/2023 5:36 PM   Literature Support:  1) Kyle RA, Durie BG, Rajkumar SV, et al. Monoclonal gammopathy of undetermined significance (MGUS) and smoldering (asymptomatic) multiple myeloma: IMWG consensus perspectives risk factors for progression and guidelines for monitoring and management. Leukemia. 2010;24(6):1121-1127. doi:10.1038/leu.2010.60   --If a patient with apparent MGUS has a serum monoclonal protein >15 g/l, IgA or IgM protein type,  or an abnormal FLC ratio, a BM aspirate and biopsy should be carried out at baseline to rule out underlying PC malignancy.   2) Marylyn Ishihara RA, Therneau TM, Rajkumar SV, et al. Prevalence of monoclonal gammopathy of undetermined significance. N Engl J Med [id number]   --MGUS was found in 3.2 percent of persons 10 years of age or older and 5.3 percent of persons 72 years of age or older.

## 2023-02-20 ENCOUNTER — Other Ambulatory Visit: Payer: Self-pay | Admitting: Family Medicine

## 2023-02-20 DIAGNOSIS — M4726 Other spondylosis with radiculopathy, lumbar region: Secondary | ICD-10-CM

## 2023-02-26 NOTE — Telephone Encounter (Signed)
Patient calling to check on progress of this request 

## 2023-02-27 NOTE — Telephone Encounter (Signed)
Spouse called to FU on this refill request, stating Pt is completely out and is in pain.   traMADol (ULTRAM) 50 MG tablet   Please send refill at your earliest convenience to:  [id number] - [place],  - [address] Phone: [phone]  Fax: [phone]

## 2023-03-22 ENCOUNTER — Telehealth: Payer: Self-pay | Admitting: Family Medicine

## 2023-03-22 MED ORDER — AMLODIPINE BESYLATE 10 MG PO TABS: 10.0000 mg | ORAL_TABLET | Freq: Every day | ORAL | 0 refills | Status: DC

## 2023-03-22 MED ORDER — ALLOPURINOL 100 MG PO TABS: 100.0000 mg | ORAL_TABLET | Freq: Every day | ORAL | 0 refills | Status: DC

## 2023-03-22 NOTE — Telephone Encounter (Signed)
Prescription Request  03/22/2023  LOV: 08/24/2022  What is the name of the medication or equipment?   traMADol traMADol (ULTRAM) 50 MG tablet  allopurinol allopurinol (ZYLOPRIM) 100 MG tablet  Spouse called to say Pt has been out of these medications for 4 days now.   Have you contacted your pharmacy to request a refill? Yes   Which pharmacy would you like this sent to?   [facility] - [place], [place] - [address] Phone: [phone]  Fax: [phone]     Patient notified that their request is being sent to the clinical staff for review and that they should receive a response within 2 business days.   Please advise at Mobile [phone] (mobile)

## 2023-03-22 NOTE — Telephone Encounter (Signed)
Patient needs to schedule follow-up appointment in clinic.  Patient should not be out of tramadol as last sent to pharmacy on 02/28/2023.   Patient should not be out of allopurinol as it was last sent in November 2023 90-day supply with 3 refills.  Patient should be able to have medications with the exception of tramadol transferred from previous pharmacy to [facility].  It is also important that patient make request for medications prior to being out of medicine.

## 2023-03-29 ENCOUNTER — Other Ambulatory Visit: Payer: Self-pay | Admitting: Family Medicine

## 2023-03-29 DIAGNOSIS — M4726 Other spondylosis with radiculopathy, lumbar region: Secondary | ICD-10-CM

## 2023-03-30 ENCOUNTER — Other Ambulatory Visit: Payer: Self-pay | Admitting: Family Medicine

## 2023-03-30 DIAGNOSIS — E785 Hyperlipidemia, unspecified: Secondary | ICD-10-CM

## 2023-03-30 NOTE — Telephone Encounter (Signed)
ATC pt, left VM to call office to schedule visit. Wife is not listed as person to discuss information, pt will need to authorize this.

## 2023-03-30 NOTE — Telephone Encounter (Signed)
Pt scheduled to come in for an OV on Thursday, 04/05/23 at 1:30 pm

## 2023-04-05 ENCOUNTER — Ambulatory Visit (INDEPENDENT_AMBULATORY_CARE_PROVIDER_SITE_OTHER): Payer: Medicare PPO | Admitting: Family Medicine

## 2023-04-05 ENCOUNTER — Telehealth: Payer: Self-pay | Admitting: Family Medicine

## 2023-04-05 ENCOUNTER — Encounter: Payer: Self-pay | Admitting: Family Medicine

## 2023-04-05 VITALS — BP 118/82 | HR 82 | Temp 98.6°F | Wt 287.8 lb

## 2023-04-05 DIAGNOSIS — E1121 Type 2 diabetes mellitus with diabetic nephropathy: Secondary | ICD-10-CM | POA: Diagnosis not present

## 2023-04-05 DIAGNOSIS — D472 Monoclonal gammopathy: Secondary | ICD-10-CM | POA: Diagnosis not present

## 2023-04-05 DIAGNOSIS — I10 Essential (primary) hypertension: Secondary | ICD-10-CM

## 2023-04-05 DIAGNOSIS — M4726 Other spondylosis with radiculopathy, lumbar region: Secondary | ICD-10-CM

## 2023-04-05 DIAGNOSIS — M109 Gout, unspecified: Secondary | ICD-10-CM

## 2023-04-05 LAB — POCT GLYCOSYLATED HEMOGLOBIN (HGB A1C): Hemoglobin A1C: 6.5 % — AB (ref 4.0–5.6)

## 2023-04-05 MED ORDER — COLCHICINE 0.6 MG PO TABS: ORAL_TABLET | ORAL | 0 refills | Status: DC

## 2023-04-05 NOTE — Progress Notes (Signed)
Established Patient Office Visit   Subjective  Patient ID: [name], male    DOB: [birth year]  Age: 77 y.o. MRN: [id number]  Chief Complaint  Patient presents with   Medical Management of Chronic Issues    DM.  May need colchicine renewed.     Patient is a 77 year old male with pmh sig for HTN, DM 2, MGUS, OA, obesity, CKD who was seen for f/u.  Pt states he is doing well.  Working part time.  States he does not have and pain.  Taking Tramadol for OA of lumbar spine.  States bs 119-148.  Followed by Endo.  Also seen by Nephro.  Cannot recall providers names.  Supposed to start Farxiga, but it is too expensive, $300/mo.  Pt endorses gout flare in L 5th digit x 1 day.  Notes eating more salads with tomatoes which is likely causing symptoms..        ROS Negative unless stated above    Objective:     BP 118/82 (BP Location: Left Arm, Patient Position: Sitting, Cuff Size: Large)   Pulse 82   Temp 98.6 F (37 C) (Oral)   Wt 287 lb 12.8 oz (130.5 kg)   SpO2 96%   BMI 40.14 kg/m    Physical Exam Constitutional:      General: He is not in acute distress.    Appearance: Normal appearance.  HENT:     Head: Normocephalic and atraumatic.     Ears:     Comments: Cerumen in right canal.  Left TM full, mild effusion noted and cholesteatoma.    Nose: Nose normal.     Mouth/Throat:     Mouth: Mucous membranes are moist.  Cardiovascular:     Rate and Rhythm: Normal rate and regular rhythm.     Heart sounds: Normal heart sounds. No murmur heard.    No gallop.  Pulmonary:     Effort: Pulmonary effort is normal. No respiratory distress.     Breath sounds: Normal breath sounds. No wheezing, rhonchi or rales.  Musculoskeletal:     Comments: Contracture on L 5th digit.  Mild edema of L 5th digit.  Skin:    General: Skin is warm and dry.  Neurological:     Mental Status: He is alert and oriented to person, place, and time.     No results found for any visits on  04/05/23.    Assessment & Plan:  Essential hypertension -Controlled -Continue current medications including lisinopril 40 mg daily, Norvasc 10 mg daily. -Will check with pharmacy regarding Lopressor 25 mg twice daily as patient should be out of medication -Lifestyle modifications encouraged  Controlled type 2 diabetes mellitus with diabetic nephropathy, without long-term current use of insulin (HCC) -Controlled -Hemoglobin A1c 5.9% on 08/24/2022 -Will need to obtain records from endocrinology with updated med list -Patient states foot exam is up-to-date. -Patient to schedule eye exam. -Continue current medications -     Microalbumin / creatinine urine ratio  Osteoarthritis of spine with radiculopathy, lumbar region -Severe OA of lumbar spine per x-ray from 2021 -Tramadol as needed  Monoclonal gammopathy -Continue follow-up with oncology  Acute gout of left hand, unspecified cause -     Colchicine; Take 2 tabs (1.2mg ) at fist sign of gout flare.  Then take 1 tab 1 hour later.  On day 2 take one tab daily until flare stops.  Dispense: 30 tablet; Refill: 0   Return in about 4 months (around 08/06/2023).   Carollee Herter  Kathrene Bongo, MD

## 2023-04-05 NOTE — Addendum Note (Signed)
Addended by: Elwin Mocha on: 04/05/2023 03:49 PM   Modules accepted: Orders

## 2023-04-05 NOTE — Telephone Encounter (Signed)
Patient was asked by provider to let her know the doctor that is monitoring blood sugar levels, to his recollection he does not have a doctor monitoring his glucose levels.

## 2023-04-05 NOTE — Telephone Encounter (Signed)
Noted  

## 2023-04-06 LAB — MICROALBUMIN / CREATININE URINE RATIO
Creatinine,U: 93.9 mg/dL
Microalb Creat Ratio: 41.5 mg/g — ABNORMAL HIGH (ref 0.0–30.0)
Microalb, Ur: 39 mg/dL — ABNORMAL HIGH (ref 0.0–1.9)

## 2023-04-09 ENCOUNTER — Telehealth: Payer: Self-pay | Admitting: Family Medicine

## 2023-04-09 ENCOUNTER — Other Ambulatory Visit: Payer: Self-pay | Admitting: Family Medicine

## 2023-04-09 DIAGNOSIS — M4726 Other spondylosis with radiculopathy, lumbar region: Secondary | ICD-10-CM

## 2023-04-09 MED ORDER — TRAMADOL HCL 50 MG PO TABS: ORAL_TABLET | ORAL | 1 refills | Status: DC

## 2023-04-09 NOTE — Telephone Encounter (Signed)
Prescription sent to pharmacy.  Would advise patient to try taking 1 tab as needed twice a day as stated during recent office visit that he was not having pain.

## 2023-04-09 NOTE — Telephone Encounter (Signed)
Prescription Request  04/09/2023  LOV: 04/05/2023  What is the name of the medication or equipment?     traMADol (ULTRAM) 50 MG tablet  Have you contacted your pharmacy to request a refill? Yes   Which pharmacy would you like this sent to?  [id number] - [place], [place] - [address] Phone: [phone]  Fax: [phone]     Patient notified that their request is being sent to the clinical staff for review and that they should receive a response within 2 business days.   Please advise at [facility] [phone]

## 2023-04-10 NOTE — Telephone Encounter (Signed)
Spoke with pt, is aware refill sent to pharmacy. Also stated he will try the 1 tab twice a day, as suggested by PCP.

## 2023-04-19 ENCOUNTER — Telehealth: Payer: Self-pay | Admitting: Family Medicine

## 2023-04-19 DIAGNOSIS — M109 Gout, unspecified: Secondary | ICD-10-CM

## 2023-04-19 NOTE — Telephone Encounter (Signed)
Pt wife is calling and colchicine 0.6 MG tablet  needs to go to  [facility] - [place], [place] - [address] Phone: [phone]  Fax: [phone]    Not [facility]

## 2023-04-24 MED ORDER — COLCHICINE 0.6 MG PO TABS
ORAL_TABLET | ORAL | 0 refills | Status: DC
Start: 2023-04-24 — End: 2024-03-17

## 2023-04-24 NOTE — Telephone Encounter (Signed)
Rx resent.

## 2023-05-24 ENCOUNTER — Other Ambulatory Visit: Payer: Self-pay | Admitting: Family Medicine

## 2023-05-24 DIAGNOSIS — E785 Hyperlipidemia, unspecified: Secondary | ICD-10-CM

## 2023-06-04 ENCOUNTER — Other Ambulatory Visit: Payer: Self-pay | Admitting: Family Medicine

## 2023-06-12 DIAGNOSIS — I129 Hypertensive chronic kidney disease with stage 1 through stage 4 chronic kidney disease, or unspecified chronic kidney disease: Secondary | ICD-10-CM | POA: Diagnosis not present

## 2023-06-12 DIAGNOSIS — E1122 Type 2 diabetes mellitus with diabetic chronic kidney disease: Secondary | ICD-10-CM | POA: Diagnosis not present

## 2023-06-12 DIAGNOSIS — R809 Proteinuria, unspecified: Secondary | ICD-10-CM | POA: Diagnosis not present

## 2023-06-12 DIAGNOSIS — D472 Monoclonal gammopathy: Secondary | ICD-10-CM | POA: Diagnosis not present

## 2023-06-12 DIAGNOSIS — N183 Chronic kidney disease, stage 3 unspecified: Secondary | ICD-10-CM | POA: Diagnosis not present

## 2023-06-12 DIAGNOSIS — N2581 Secondary hyperparathyroidism of renal origin: Secondary | ICD-10-CM | POA: Diagnosis not present

## 2023-06-12 DIAGNOSIS — D631 Anemia in chronic kidney disease: Secondary | ICD-10-CM | POA: Diagnosis not present

## 2023-06-12 DIAGNOSIS — E875 Hyperkalemia: Secondary | ICD-10-CM | POA: Diagnosis not present

## 2023-06-13 ENCOUNTER — Other Ambulatory Visit: Payer: Self-pay | Admitting: Family Medicine

## 2023-06-13 DIAGNOSIS — M5441 Lumbago with sciatica, right side: Secondary | ICD-10-CM

## 2023-06-21 ENCOUNTER — Encounter (INDEPENDENT_AMBULATORY_CARE_PROVIDER_SITE_OTHER): Payer: Self-pay

## 2023-07-12 ENCOUNTER — Other Ambulatory Visit: Payer: Self-pay

## 2023-07-12 ENCOUNTER — Inpatient Hospital Stay: Payer: Medicare PPO | Attending: Hematology and Oncology

## 2023-07-12 DIAGNOSIS — C9000 Multiple myeloma not having achieved remission: Secondary | ICD-10-CM | POA: Insufficient documentation

## 2023-07-12 DIAGNOSIS — Z800 Family history of malignant neoplasm of digestive organs: Secondary | ICD-10-CM | POA: Insufficient documentation

## 2023-07-12 DIAGNOSIS — Z79899 Other long term (current) drug therapy: Secondary | ICD-10-CM | POA: Diagnosis not present

## 2023-07-12 DIAGNOSIS — I2510 Atherosclerotic heart disease of native coronary artery without angina pectoris: Secondary | ICD-10-CM | POA: Insufficient documentation

## 2023-07-12 DIAGNOSIS — D472 Monoclonal gammopathy: Secondary | ICD-10-CM | POA: Insufficient documentation

## 2023-07-12 DIAGNOSIS — M109 Gout, unspecified: Secondary | ICD-10-CM | POA: Diagnosis not present

## 2023-07-12 DIAGNOSIS — Z87891 Personal history of nicotine dependence: Secondary | ICD-10-CM | POA: Diagnosis not present

## 2023-07-12 DIAGNOSIS — I10 Essential (primary) hypertension: Secondary | ICD-10-CM | POA: Insufficient documentation

## 2023-07-12 DIAGNOSIS — E785 Hyperlipidemia, unspecified: Secondary | ICD-10-CM | POA: Diagnosis not present

## 2023-07-12 LAB — CBC WITH DIFFERENTIAL (CANCER CENTER ONLY)
Abs Immature Granulocytes: 0.02 10*3/uL (ref 0.00–0.07)
Basophils Absolute: 0.1 10*3/uL (ref 0.0–0.1)
Basophils Relative: 1 %
Eosinophils Absolute: 0.1 10*3/uL (ref 0.0–0.5)
Eosinophils Relative: 2 %
HCT: 40.4 % (ref 39.0–52.0)
Hemoglobin: 13.3 g/dL (ref 13.0–17.0)
Immature Granulocytes: 0 %
Lymphocytes Relative: 29 %
Lymphs Abs: 1.9 10*3/uL (ref 0.7–4.0)
MCH: 31.2 pg (ref 26.0–34.0)
MCHC: 32.9 g/dL (ref 30.0–36.0)
MCV: 94.8 fL (ref 80.0–100.0)
Monocytes Absolute: 0.6 10*3/uL (ref 0.1–1.0)
Monocytes Relative: 9 %
Neutro Abs: 4 10*3/uL (ref 1.7–7.7)
Neutrophils Relative %: 59 %
Platelet Count: 213 10*3/uL (ref 150–400)
RBC: 4.26 MIL/uL (ref 4.22–5.81)
RDW: 14.5 % (ref 11.5–15.5)
WBC Count: 6.6 10*3/uL (ref 4.0–10.5)
nRBC: 0 % (ref 0.0–0.2)

## 2023-07-12 LAB — CMP (CANCER CENTER ONLY)
ALT: 22 U/L (ref 0–44)
AST: 22 U/L (ref 15–41)
Albumin: 3.6 g/dL (ref 3.5–5.0)
Alkaline Phosphatase: 78 U/L (ref 38–126)
Anion gap: 6 (ref 5–15)
BUN: 32 mg/dL — ABNORMAL HIGH (ref 8–23)
CO2: 27 mmol/L (ref 22–32)
Calcium: 9.7 mg/dL (ref 8.9–10.3)
Chloride: 105 mmol/L (ref 98–111)
Creatinine: 1.67 mg/dL — ABNORMAL HIGH (ref 0.61–1.24)
GFR, Estimated: 43 mL/min — ABNORMAL LOW (ref >60)
Glucose, Bld: 134 mg/dL — ABNORMAL HIGH (ref 70–99)
Potassium: 4.6 mmol/L (ref 3.5–5.1)
Sodium: 138 mmol/L (ref 135–145)
Total Bilirubin: 0.5 mg/dL (ref 0.3–1.2)
Total Protein: 7.7 g/dL (ref 6.5–8.1)

## 2023-07-13 LAB — KAPPA/LAMBDA LIGHT CHAINS
Kappa free light chain: 104.9 mg/L — ABNORMAL HIGH (ref 3.3–19.4)
Kappa, lambda light chain ratio: 2.75 — ABNORMAL HIGH (ref 0.26–1.65)
Lambda free light chains: 38.2 mg/L — ABNORMAL HIGH (ref 5.7–26.3)

## 2023-07-13 LAB — BETA 2 MICROGLOBULIN, SERUM: Beta-2 Microglobulin: 3.6 mg/L — ABNORMAL HIGH (ref 0.6–2.4)

## 2023-07-15 LAB — MULTIPLE MYELOMA PANEL, SERUM
Albumin SerPl Elph-Mcnc: 3.2 g/dL (ref 2.9–4.4)
Albumin/Glob SerPl: 0.8 (ref 0.7–1.7)
Alpha 1: 0.3 g/dL (ref 0.0–0.4)
Alpha2 Glob SerPl Elph-Mcnc: 0.8 g/dL (ref 0.4–1.0)
B-Globulin SerPl Elph-Mcnc: 1.8 g/dL — ABNORMAL HIGH (ref 0.7–1.3)
Gamma Glob SerPl Elph-Mcnc: 1.2 g/dL (ref 0.4–1.8)
Globulin, Total: 4.1 g/dL — ABNORMAL HIGH (ref 2.2–3.9)
IgA: 1030 mg/dL — ABNORMAL HIGH (ref 61–437)
IgG (Immunoglobin G), Serum: 1544 mg/dL (ref 603–1613)
IgM (Immunoglobulin M), Srm: 31 mg/dL (ref 15–143)
M Protein SerPl Elph-Mcnc: 0.7 g/dL — ABNORMAL HIGH
Total Protein ELP: 7.3 g/dL (ref 6.0–8.5)

## 2023-07-19 ENCOUNTER — Ambulatory Visit: Payer: Medicare PPO | Admitting: Family Medicine

## 2023-07-19 DIAGNOSIS — Z0000 Encounter for general adult medical examination without abnormal findings: Secondary | ICD-10-CM

## 2023-07-19 NOTE — Progress Notes (Signed)
PATIENT CHECK-IN and HEALTH RISK ASSESSMENT QUESTIONNAIRE:  -completed by phone/video for upcoming Medicare Preventive Visit  Pre-Visit Check-in: 1)Vitals (height, wt, BP, etc) - record in vitals section for visit on day of visit Request home vitals (wt, BP, etc.) and enter into vitals, THEN update Vital Signs SmartPhrase below at the top of the HPI. See below.  2)Review and Update Medications, Allergies PMH, Surgeries, Social history in Epic 3)Hospitalizations in the last year with date/reason?  Dehydration, 6 months ago 4)Review and Update Care Team (patient's specialists) in Epic 5) Complete PHQ9 in Epic  6) Complete Fall Screening in Epic 7)Review all Health Maintenance Due and order under PCP if not done.  Medicare Wellness Patient Questionnaire:  Answer theses question about your habits: Do you drink alcohol?  4-5 drinks per day Have you ever smoked? No, used to smoke in his teens, not since Do you use an illicit drugs?no Do you exercises? Walks around his neighborhood, usually 10 -15 minutes 3x Are you sexually active? no He has cut back on processed meats, tries   Beverages: power ade, gatorade  Answer theses question about you: Can you perform most household chores? yes Do you find it hard to follow a conversation in a noisy room?no Do you often ask people to speak up or repeat themselves?no Do you feel that you have a problem with memory? no Do you balance your checkbook and or bank acounts? Wife does this Do you feel safe at home? yes Last dentist visit? denture Do you need assistance with any of the following: Please note if so  - none  Driving?  Feeding yourself?  Getting from bed to chair?  Getting to the toilet?  Bathing or showering?  Dressing yourself?  Managing money?  Climbing a flight of stairs  Preparing meals?    Do you have Advanced Directives in place (Living Will, Healthcare Power or Attorney)?  yes   Last eye Exam and location? Sees eye  doctor - seeing soon   Do you currently use prescribed or non-prescribed narcotic or opioid pain medications? Tramadol, as needed about 1 per day, Dr. Salomon Fick provided  Request home vitals (wt, BP, etc.) and enter into vitals, THEN update Vital Signs SmartPhrase below at the top of the HPI. See below.      ----------------------------------------------------------------------------------------------------------------------------------------------------------------------------------------------------------------------  Vital Signs: Unable to obtain new vitals due to this being a telehealth visit.   MEDICARE ANNUAL PREVENTIVE CARE VISIT WITH PROVIDER (Welcome to Medicare, initial annual wellness or annual wellness exam)  Virtual Visit via Phone Note  I connected with Dequarius J Mella on 07/19/23  by phone and verified that I am speaking with the correct person using two identifiers.  Location patient: home Location provider:work or home office Persons participating in the virtual visit: patient, provider  Concerns and/or follow up today: none   See HM section in Epic for other details of completed HM.    ROS: negative for report of fevers, unintentional weight loss, vision changes, vision loss, hearing loss or change, chest pain, sob, hemoptysis, melena, hematochezia, hematuria, falls, bleeding or bruising, thoughts of suicide or self harm, memory loss  Patient-completed extensive health risk assessment - reviewed and discussed with the patient: See Health Risk Assessment completed with patient prior to the visit either above or in recent phone note. This was reviewed in detailed with the patient today and appropriate recommendations, orders and referrals were placed as needed per Summary below and patient instructions.   Review of Medical History: -PMH,  PSH, Family History and current specialty and care providers reviewed and updated and listed below   Patient Care Team: Deeann Saint, MD as PCP - General (Family Medicine) Glenford Peers, OD as Referring Physician (Optometry) [facility], [facility]   Past Medical History:  Diagnosis Date   CAD, ARTERY BYPASS GRAFT 02/10/2008   CAROTID ARTERY STENOSIS, WITHOUT INFARCTION 09/22/2008   CORONARY ARTERY DISEASE 04/03/2008   Diabetes mellitus    "borderline"   ERECTILE DYSFUNCTION 01/14/2009   GOUT 01/16/2008   HERPES ZOSTER 04/15/2010   HYPERLIPIDEMIA 01/16/2008   HYPERTENSION 01/16/2008   IMPAIRED GLUCOSE TOLERANCE 08/22/2010   Shingles 2011   left chest   Ulcer 30 yrs ago   stomach    Past Surgical History:  Procedure Laterality Date   COLONOSCOPY     3 months ago   CORONARY ARTERY BYPASS GRAFT  2009   vessels x3   EAR CYST EXCISION N/A 07/29/2013   Procedure: excision of urachal cyst flexible cystoscopy insertion of foley cath;  Surgeon: Antony Haste, MD;  Location: [facility];  Service: Urology;  Laterality: N/A;   LAPAROSCOPY N/A 07/29/2013   Procedure: LAPAROSCOPY DIAGNOSTIC  laparoscopic excision of urachal cyst ;  Surgeon: Lodema Pilot, DO;  Location: [facility];  Service: General;  Laterality: N/A;   LAPAROTOMY  08/25/2012   Procedure: EXPLORATORY LAPAROTOMY;  Surgeon: Lodema Pilot, DO;  Location: [facility];  Service: General;  Laterality: N/A;  incision and drainage abdominal wall abcessand intra abdominal wall abcess   PARTIAL GASTRECTOMY  1974   bleeding ulcers   urachal  2014   Urachal cyst removal    Social History   Socioeconomic History   Marital status: Married    Spouse name: Not on file   Number of children: 3   Years of education: Not on file   Highest education level: Not on file  Occupational History    Employer: [employer]  Tobacco Use   Smoking status: Former    Current packs/day: 0.00    Average packs/day: 1 pack/day for 10.0 years (10.0 ttl pk-yrs)    Types: Cigarettes    Start date: 07/26/1989    Quit date: 07/27/1999    Years since quitting: 23.9    Smokeless tobacco: Never  Substance and Sexual Activity   Alcohol use: Yes    Comment: 4 beers a day   Drug use: No   Sexual activity: Not on file  Other Topics Concern   Not on file  Social History Narrative   epworth sleepiness scale score: 4   Social Determinants of Health   Financial Resource Strain: Low Risk  (07/14/2022)   Overall Financial Resource Strain (CARDIA)    Difficulty of Paying Living Expenses: Not hard at all  Food Insecurity: No Food Insecurity (07/14/2022)   Hunger Vital Sign    Worried About Running Out of Food in the Last Year: Never true    Ran Out of Food in the Last Year: Never true  Transportation Needs: No Transportation Needs (07/14/2022)   PRAPARE - Administrator, Civil Service (Medical): No    Lack of Transportation (Non-Medical): No  Physical Activity: Insufficiently Active (07/14/2022)   Exercise Vital Sign    Days of Exercise per Week: 3 days    Minutes of Exercise per Session: 20 min  Stress: No Stress Concern Present (07/14/2022)   [employer] of Occupational Health - Occupational Stress Questionnaire    Feeling of Stress : Not  at all  Social Connections: Socially Integrated (07/14/2022)   Social Connection and Isolation Panel [NHANES]    Frequency of Communication with Friends and Family: More than three times a week    Frequency of Social Gatherings with Friends and Family: More than three times a week    Attends Religious Services: More than 4 times per year    Active Member of [employer] or Organizations: Yes    Attends Engineer, structural: More than 4 times per year    Marital Status: Married  Catering manager Violence: Not At Risk (07/14/2022)   Humiliation, Afraid, Rape, and Kick questionnaire    Fear of Current or Ex-Partner: No    Emotionally Abused: No    Physically Abused: No    Sexually Abused: No    Family History  Problem Relation Age of Onset   Heart disease Mother    Heart disease Sister    Colon cancer Neg  Hx     Current Outpatient Medications on File Prior to Visit  Medication Sig Dispense Refill   allopurinol (ZYLOPRIM) 100 MG tablet TAKE 1 TABLET EVERY DAY 90 tablet 1   amLODipine (NORVASC) 10 MG tablet TAKE 1 TABLET EVERY DAY 90 tablet 1   atorvastatin (LIPITOR) 80 MG tablet TAKE 1 TABLET BY MOUTH EVERY DAY IN THE MORNING 90 tablet 0   colchicine 0.6 MG tablet Take 2 tabs (1.2mg ) at fist sign of gout flare.  Then take 1 tab 1 hour later.  On day 2 take one tab daily until flare stops. 30 tablet 0   Continuous Blood Gluc Receiver (FREESTYLE LIBRE 14 DAY READER) DEVI Use as directed 1 each 0   Continuous Blood Gluc Receiver (FREESTYLE LIBRE READER) DEVI Use with Freestyle Sensor to check blood sugar levels. 1 each 0   Continuous Blood Gluc Sensor (FREESTYLE LIBRE SENSOR SYSTEM) MISC Apply device as directed every 2 weeks. 2 each 11   gabapentin (NEURONTIN) 100 MG capsule TAKE 1 CAPSULE (100 MG TOTAL) BY MOUTH AT BEDTIME AS NEEDED (PAIN). 90 capsule 1   glucose blood (ONE TOUCH ULTRA TEST) test strip 1 each by Other route daily. Use as instructed 100 each 12   Lancets (ONETOUCH ULTRASOFT) lancets 1 each by Other route daily. Use as instructed 100 each 12   lisinopril (ZESTRIL) 40 MG tablet TAKE 1 TABLET BY MOUTH EVERY DAY (Patient not taking: Reported on 04/05/2023) 90 tablet 1   LOKELMA 10 g PACK packet Take 1 packet by mouth daily.     metoprolol tartrate (LOPRESSOR) 25 MG tablet Take 0.5 tablets (12.5 mg total) by mouth 2 (two) times daily. (Patient not taking: Reported on 04/05/2023) 90 tablet 3   nitroGLYCERIN (NITROSTAT) 0.4 MG SL tablet Place 1 tablet (0.4 mg total) under the tongue every 5 (five) minutes as needed for chest pain. 15 tablet 5   sildenafil (VIAGRA) 25 MG tablet Take 1 tablet (25 mg total) by mouth daily as needed for erectile dysfunction. 10 tablet 1   tamsulosin (FLOMAX) 0.4 MG CAPS capsule TAKE 1 CAPSULE BY MOUTH EVERY DAY (Patient not taking: Reported on 04/05/2023) 90  capsule 1   traMADol (ULTRAM) 50 MG tablet TAKE 2 TABLETS BY MOUTH EVERY 12 HOURS AS NEEDED 120 tablet 1   No current facility-administered medications on file prior to visit.    No Known Allergies     Physical Exam Vitals requested from patient and listed below if patient had equipment and was able to obtain at home for  this virtual visit: There were no vitals filed for this visit. Estimated body mass index is 40.14 kg/m as calculated from the following:   Height as of 10/12/22: 5\' 11"  (1.803 m).   Weight as of 04/05/23: 287 lb 12.8 oz (130.5 kg).  EKG (optional): deferred due to virtual visit  GENERAL: alert, oriented, no acute distress detected; full vision exam deferred due to pandemic and/or virtual encounter  PSYCH/NEURO: pleasant and cooperative, no obvious depression or anxiety, speech and thought processing grossly intact, Cognitive function grossly intact  Flowsheet Row Office Visit from [facility]/24/2022 in [facility] [place]  PHQ-9 Total Score 0           07/19/2023    1:03 PM 04/05/2023    1:34 PM 08/24/2022    1:35 PM 07/14/2022    3:19 PM 01/25/2022    1:29 PM  Depression screen PHQ 2/9  Decreased Interest 0 0 0 0 0  Down, Depressed, Hopeless 0 0 0 0 0  PHQ - 2 Score 0 0 0 0 0  Altered sleeping   0  0  Tired, decreased energy   0  0  Change in appetite   0  0  Feeling bad or failure about yourself    0  0  Trouble concentrating   0  0  Moving slowly or fidgety/restless   0  0  Suicidal thoughts   0  0  PHQ-9 Score   0  0  Difficult doing work/chores     Not difficult at all       01/25/2022    1:28 PM 07/14/2022    3:21 PM 08/24/2022    1:35 PM 04/05/2023    1:34 PM 07/19/2023    1:02 PM  Fall Risk  Falls in the past year? 0 0 0 0 1  Was there an injury with Fall? 0 0 0 0 0  Fall Risk Category Calculator 0 0 0 0 2  Fall Risk Category (Retired) Low Low Low    (RETIRED) Patient Fall Risk Level Low fall risk Low fall risk Low fall  risk    Patient at Risk for Falls Due to No Fall Risks No Fall Risks No Fall Risks No Fall Risks History of fall(s)  Fall risk Follow up Falls evaluation completed Falls prevention discussed Falls evaluation completed Falls evaluation completed Education provided;Falls evaluation completed  Tripped in garden.    SUMMARY AND PLAN:  Encounter for Medicare annual wellness exam   Discussed applicable health maintenance/preventive health measures and advised and referred or ordered per patient preferences: -discussed each measure due -he plans to get foot exam with PCP tomorrow -report wife is setting up his eye exam -discussed vaccines due and he plans to get at pharmacy - advised to let us know when he does so that we can update  Health Maintenance  Topic Date Due   Zoster Vaccines- Shingrix (1 of 2) Never done   FOOT EXAM  04/08/2021   OPHTHALMOLOGY EXAM  05/12/2021   DTaP/Tdap/Td (2 - Td or Tdap) 11/12/2021   INFLUENZA VACCINE  06/07/2023   COVID-19 Vaccine (4 - 2023-24 season) 07/08/2023   HEMOGLOBIN A1C  10/06/2023   Diabetic kidney evaluation - Urine ACR  06/12/2024   Diabetic kidney evaluation - eGFR measurement  07/11/2024   Medicare Annual Wellness (AWV)  07/18/2024   Pneumonia Vaccine 62+ Years old  Completed   Hepatitis C Screening  Completed   HPV VACCINES  Aged Out  Colonoscopy  Discontinued     Education and counseling on the following was provided based on the above review of health and a plan/checklist for the patient, along with additional information discussed, was provided for the patient in the patient instructions :  -Provided counseling and plan for increased risk of falling if applicable per above screening. Reviewed and  safe balance exercises that can be done at home to improve balance and discussed exercise guidelines for adults with include balance exercises at least 3 days per week.  -Advised and counseled on a healthy lifestyle - including the  importance of a healthy diet, regular physical activity, social connections and stress management. -Reviewed patient's current diet. Advised and counseled on a whole foods based healthy diet. A summary of a healthy diet was provided in the Patient Instructions.  -reviewed patient's current physical activity level and discussed exercise guidelines for adults. Discussed community resources and ideas for safe exercise at home to assist in meeting exercise guideline recommendations in a safe and healthy way.  -Advise yearly dental visits at minimum and regular eye exams -Advised and counseled on alcohol safe limits, risks; advised to cut back, counseled, he feels he could cut back to no more than 1-2 drinks per day and reports he intends to do so after our conversation, reports has been able to cut back easily in the past. Advised to let us know if any difficulty or needs help.   Follow up: see patient instructions   Patient Instructions  I really enjoyed getting to talk with you today! I am available on Tuesdays and Thursdays for virtual visits if you have any questions or concerns, or if I can be of any further assistance.   CHECKLIST FROM ANNUAL WELLNESS VISIT:  -Follow up (please call to schedule if not scheduled after visit):   -yearly for annual wellness visit with primary care office  Here is a list of your preventive care/health maintenance measures and the plan for each if any are due:  PLAN For any measures below that may be due:  -can get the updated flu and covid vaccines, tetanus and the shingles vaccines at the pharmacy; please let us [facility] when you do so that we can update your chart.  -please schedule your eye exam -please do your foot exam with Dr. Salomon Fick tomorrow  Health Maintenance  Topic Date Due   Zoster Vaccines- Shingrix (1 of 2) Never done   FOOT EXAM  04/08/2021   OPHTHALMOLOGY EXAM  05/12/2021   DTaP/Tdap/Td (2 - Td or Tdap) 11/12/2021   INFLUENZA VACCINE  06/07/2023    COVID-19 Vaccine (4 - 2023-24 season) 07/08/2023   HEMOGLOBIN A1C  10/06/2023   Diabetic kidney evaluation - Urine ACR  06/12/2024   Diabetic kidney evaluation - eGFR measurement  07/11/2024   Medicare Annual Wellness (AWV)  07/18/2024   Pneumonia Vaccine 13+ Years old  Completed   Hepatitis C Screening  Completed   HPV VACCINES  Aged Out   Colonoscopy  Discontinued    -See a dentist at least yearly  -Get your eyes checked and then per your eye specialist's recommendations  -Other issues addressed today:  - please cut back to no more than 1-2 drinks per day of alcohol  -I have included below further information regarding a healthy whole foods based diet, physical activity guidelines for adults, stress management and opportunities for social connections. I hope you find this information useful.   -----------------------------------------------------------------------------------------------------------------------------------------------------------------------------------------------------------------------------------------------------------  NUTRITION: -eat real food: lots of colorful  vegetables (half the plate) and fruits -5-7 servings of vegetables and fruits per day (fresh or steamed is best), exp. 2 servings of vegetables with lunch and dinner and 2 servings of fruit per day. Berries and greens such as kale and collards are great choices.  -consume on a regular basis: whole grains (make sure first ingredient on label contains the word "whole"), fresh fruits, fish, nuts, seeds, healthy oils (such as olive oil, avocado oil, grape seed oil) -may eat small amounts of dairy and lean meat on occasion, but avoid processed meats such as ham, bacon, lunch meat, etc. -drink water -try to avoid fast food and pre-packaged foods, processed meat -most experts advise limiting sodium to < 2300mg  per day, should limit further is any chronic conditions such as high blood pressure, heart disease,  diabetes, etc. The American Heart Association advised that < 1500mg  is is ideal -try to avoid foods that contain any ingredients with names you do not recognize  -try to avoid sugar/sweets (except for the natural sugar that occurs in fresh fruit) -try to avoid sweet drinks -try to avoid white rice, white bread, pasta (unless whole grain), white or yellow potatoes  EXERCISE GUIDELINES FOR ADULTS: -if you wish to increase your physical activity, do so gradually and with the approval of your doctor -STOP and seek medical care immediately if you have any chest pain, chest discomfort or trouble breathing when starting or increasing exercise  -move and stretch your body, legs, feet and arms when sitting for long periods -Physical activity guidelines for optimal health in adults: -least 150 minutes per week of aerobic exercise (can talk, but not sing) once approved by your doctor, 20-30 minutes of sustained activity or two 10 minute episodes of sustained activity every day.  -resistance training at least 2 days per week if approved by your doctor -balance exercises 3+ days per week:   Stand somewhere where you have something sturdy to hold onto if you lose balance.    1) lift up on toes, start with 5x per day and work up to 20x   2) stand and lift on leg straight out to the side so that foot is a few inches of the floor, start with 5x each side and work up to 20x each side   3) stand on one foot, start with 5 seconds each side and work up to 20 seconds on each side  If you need ideas or help with getting more active:  -Silver sneakers https://tools.silversneakers.com  -Walk with a Doc: http://www.duncan-williams.com/  -try to include resistance (weight lifting/strength building) and balance exercises twice per week: or the following link for ideas: http://castillo-powell.com/  BuyDucts.dk  STRESS  MANAGEMENT: -can try meditating, or just sitting quietly with deep breathing while intentionally relaxing all parts of your body for 5 minutes daily -if you need further help with stress, anxiety or depression please follow up with your primary doctor or contact the wonderful folks at [facility]: [phone]  SOCIAL CONNECTIONS: -options in [place] if you wish to engage in more social and exercise related activities:  -Silver sneakers https://tools.silversneakers.com  -Walk with a Doc: http://www.duncan-williams.com/  -Check out the [facility] Active Adults 50+ section on the [place] of [employer] (hiking clubs, book clubs, cards and games, chess, exercise classes, aquatic classes and much more) - see the website for details: https://www.[place].gov/departments/parks-recreation/active-adults50  -YouTube has lots of exercise videos for different ages and abilities as well  -[facility] (a variety of indoor and outdoor  inperson activities for adults). [phone]. [address].  -Virtual Online Classes (a variety of topics): see seniorplanet.org or call [phone]  -consider volunteering at a school, hospice center, church, senior center or elsewhere           Terressa Koyanagi, DO

## 2023-07-19 NOTE — Patient Instructions (Addendum)
I really enjoyed getting to talk with you today! I am available on Tuesdays and Thursdays for virtual visits if you have any questions or concerns, or if I can be of any further assistance.   CHECKLIST FROM ANNUAL WELLNESS VISIT:  -Follow up (please call to schedule if not scheduled after visit):   -yearly for annual wellness visit with primary care office  Here is a list of your preventive care/health maintenance measures and the plan for each if any are due:  PLAN For any measures below that may be due:  -can get the updated flu and covid vaccines, tetanus and the shingles vaccines at the pharmacy; please let us [facility] when you do so that we can update your chart.  -please schedule your eye exam -please do your foot exam with Dr. Salomon Fick tomorrow  Health Maintenance  Topic Date Due   Zoster Vaccines- Shingrix (1 of 2) Never done   FOOT EXAM  04/08/2021   OPHTHALMOLOGY EXAM  05/12/2021   DTaP/Tdap/Td (2 - Td or Tdap) 11/12/2021   INFLUENZA VACCINE  06/07/2023   COVID-19 Vaccine (4 - 2023-24 season) 07/08/2023   HEMOGLOBIN A1C  10/06/2023   Diabetic kidney evaluation - Urine ACR  06/12/2024   Diabetic kidney evaluation - eGFR measurement  07/11/2024   Medicare Annual Wellness (AWV)  07/18/2024   Pneumonia Vaccine 44+ Years old  Completed   Hepatitis C Screening  Completed   HPV VACCINES  Aged Out   Colonoscopy  Discontinued    -See a dentist at least yearly  -Get your eyes checked and then per your eye specialist's recommendations  -Other issues addressed today:  - please cut back to no more than 1-2 drinks per day of alcohol  -I have included below further information regarding a healthy whole foods based diet, physical activity guidelines for adults, stress management and opportunities for social connections. I hope you find this information useful.    -----------------------------------------------------------------------------------------------------------------------------------------------------------------------------------------------------------------------------------------------------------  NUTRITION: -eat real food: lots of colorful vegetables (half the plate) and fruits -5-7 servings of vegetables and fruits per day (fresh or steamed is best), exp. 2 servings of vegetables with lunch and dinner and 2 servings of fruit per day. Berries and greens such as kale and collards are great choices.  -consume on a regular basis: whole grains (make sure first ingredient on label contains the word "whole"), fresh fruits, fish, nuts, seeds, healthy oils (such as olive oil, avocado oil, grape seed oil) -may eat small amounts of dairy and lean meat on occasion, but avoid processed meats such as ham, bacon, lunch meat, etc. -drink water -try to avoid fast food and pre-packaged foods, processed meat -most experts advise limiting sodium to < 2300mg  per day, should limit further is any chronic conditions such as high blood pressure, heart disease, diabetes, etc. The American Heart Association advised that < 1500mg  is is ideal -try to avoid foods that contain any ingredients with names you do not recognize  -try to avoid sugar/sweets (except for the natural sugar that occurs in fresh fruit) -try to avoid sweet drinks -try to avoid white rice, white bread, pasta (unless whole grain), white or yellow potatoes  EXERCISE GUIDELINES FOR ADULTS: -if you wish to increase your physical activity, do so gradually and with the approval of your doctor -STOP and seek medical care immediately if you have any chest pain, chest discomfort or trouble breathing when starting or increasing exercise  -move and stretch your body, legs, feet and arms when sitting for  long periods -Physical activity guidelines for optimal health in adults: -least 150 minutes per week of  aerobic exercise (can talk, but not sing) once approved by your doctor, 20-30 minutes of sustained activity or two 10 minute episodes of sustained activity every day.  -resistance training at least 2 days per week if approved by your doctor -balance exercises 3+ days per week:   Stand somewhere where you have something sturdy to hold onto if you lose balance.    1) lift up on toes, start with 5x per day and work up to 20x   2) stand and lift on leg straight out to the side so that foot is a few inches of the floor, start with 5x each side and work up to 20x each side   3) stand on one foot, start with 5 seconds each side and work up to 20 seconds on each side  If you need ideas or help with getting more active:  -Silver sneakers https://tools.silversneakers.com  -Walk with a Doc: http://www.duncan-williams.com/  -try to include resistance (weight lifting/strength building) and balance exercises twice per week: or the following link for ideas: http://castillo-powell.com/  BuyDucts.dk  STRESS MANAGEMENT: -can try meditating, or just sitting quietly with deep breathing while intentionally relaxing all parts of your body for 5 minutes daily -if you need further help with stress, anxiety or depression please follow up with your primary doctor or contact the wonderful folks at [facility]: [phone]  SOCIAL CONNECTIONS: -options in [place] if you wish to engage in more social and exercise related activities:  -Silver sneakers https://tools.silversneakers.com  -Walk with a Doc: http://www.duncan-williams.com/  -Check out the [facility] Active Adults 50+ section on the [place] of [employer] (hiking clubs, book clubs, cards and games, chess, exercise classes, aquatic classes and much more) - see the website for  details: https://www.[place].gov/departments/parks-recreation/active-adults50  -YouTube has lots of exercise videos for different ages and abilities as well  -[facility] (a variety of indoor and outdoor inperson activities for adults). [phone]. [address]  -Virtual Online Classes (a variety of topics): see seniorplanet.org or call ([phone]) [phone]  -consider volunteering at a school, hospice center, church, senior center or elsewhere

## 2023-07-20 ENCOUNTER — Inpatient Hospital Stay (HOSPITAL_BASED_OUTPATIENT_CLINIC_OR_DEPARTMENT_OTHER): Payer: Medicare PPO | Admitting: Hematology and Oncology

## 2023-07-20 VITALS — BP 145/73 | HR 88 | Temp 97.8°F | Resp 16 | Wt 286.3 lb

## 2023-07-20 DIAGNOSIS — C9000 Multiple myeloma not having achieved remission: Secondary | ICD-10-CM | POA: Diagnosis not present

## 2023-07-20 DIAGNOSIS — I10 Essential (primary) hypertension: Secondary | ICD-10-CM | POA: Diagnosis not present

## 2023-07-20 DIAGNOSIS — I2510 Atherosclerotic heart disease of native coronary artery without angina pectoris: Secondary | ICD-10-CM | POA: Diagnosis not present

## 2023-07-20 DIAGNOSIS — E785 Hyperlipidemia, unspecified: Secondary | ICD-10-CM | POA: Diagnosis not present

## 2023-07-20 DIAGNOSIS — M109 Gout, unspecified: Secondary | ICD-10-CM | POA: Diagnosis not present

## 2023-07-20 DIAGNOSIS — Z79899 Other long term (current) drug therapy: Secondary | ICD-10-CM | POA: Diagnosis not present

## 2023-07-20 DIAGNOSIS — D472 Monoclonal gammopathy: Secondary | ICD-10-CM

## 2023-07-20 DIAGNOSIS — Z87891 Personal history of nicotine dependence: Secondary | ICD-10-CM | POA: Diagnosis not present

## 2023-07-20 DIAGNOSIS — Z800 Family history of malignant neoplasm of digestive organs: Secondary | ICD-10-CM | POA: Diagnosis not present

## 2023-07-20 NOTE — Progress Notes (Unsigned)
[facility] [phone]   [phone]  PROGRESS NOTE  Patient Care Team: Deeann Saint, MD as PCP - General (Family Medicine) Glenford Peers, OD as Referring Physician (Optometry) [facility], [facility]  Hematological/Oncological History # IgA kappa Monoclonal Gammopathy  10/05/2021: evaluated by [facility]. Found to have M protein 0.6, Kappa 130, Lambda 40.3, Ratio 3.25. IFE shows an IgA Kappa monoclonal protein. Cr 2.00 11/23/2021: establish care with Dr. Leonides Schanz  12/29/2021: Bone marrow biopsy performed showed hypercellular bone marrow for age with light plasmacytosis (5%)  Interval History:  [name] 77 y.o. male with medical history significant for an IgA kappa monoclonal gammopathy who presents for a follow up visit. The patient's last visit was on 07/20/2022. In the interim since the last visit he has had no major changes in his health.  On exam today [name] notes he has been well overall interim since her last visit.  He is had no major changes in his health.  He had no ER visits or emergency room visits.  He notes no new bone or back pain.  His energy is currently about a 9 out of 10.  He notes his weight has been "up-and-down".  He is not having any fevers, chills, sweats.  He also denies any changes in his urine such as bubbling or darkening of the urine.  He is not having any overt signs of bleeding or bruising.  Overall his appetite is strong and he is eating well.  He denies any shortness of breath or chest pain.  Overall he is at his baseline level of health.  A full 10 point ROS is listed below.  MEDICAL HISTORY:  Past Medical History:  Diagnosis Date   CAD, ARTERY BYPASS GRAFT 02/10/2008   CAROTID ARTERY STENOSIS, WITHOUT INFARCTION 09/22/2008   CORONARY ARTERY DISEASE 04/03/2008   Diabetes mellitus    "borderline"   ERECTILE DYSFUNCTION 01/14/2009   GOUT 01/16/2008   HERPES ZOSTER 04/15/2010    HYPERLIPIDEMIA 01/16/2008   HYPERTENSION 01/16/2008   IMPAIRED GLUCOSE TOLERANCE 08/22/2010   Shingles 2011   left chest   Ulcer 30 yrs ago   stomach    SURGICAL HISTORY: Past Surgical History:  Procedure Laterality Date   COLONOSCOPY     3 months ago   CORONARY ARTERY BYPASS GRAFT  2009   vessels x3   EAR CYST EXCISION N/A 07/29/2013   Procedure: excision of urachal cyst flexible cystoscopy insertion of foley cath;  Surgeon: Antony Haste, MD;  Location: [facility];  Service: Urology;  Laterality: N/A;   LAPAROSCOPY N/A 07/29/2013   Procedure: LAPAROSCOPY DIAGNOSTIC  laparoscopic excision of urachal cyst ;  Surgeon: Lodema Pilot, DO;  Location: [facility];  Service: General;  Laterality: N/A;   LAPAROTOMY  08/25/2012   Procedure: EXPLORATORY LAPAROTOMY;  Surgeon: Lodema Pilot, DO;  Location: [facility];  Service: General;  Laterality: N/A;  incision and drainage abdominal wall abcessand intra abdominal wall abcess   PARTIAL GASTRECTOMY  1974   bleeding ulcers   urachal  2014   Urachal cyst removal    SOCIAL HISTORY: Social History   Socioeconomic History   Marital status: Married    Spouse name: Not on file   Number of children: 3   Years of education: Not on file   Highest education level: Not on file  Occupational History    Employer: [employer]  Tobacco Use   Smoking status: Former    Current  packs/day: 0.00    Average packs/day: 1 pack/day for 10.0 years (10.0 ttl pk-yrs)    Types: Cigarettes    Start date: 07/26/1989    Quit date: 07/27/1999    Years since quitting: 23.9   Smokeless tobacco: Never  Substance and Sexual Activity   Alcohol use: Yes    Comment: 4 beers a day   Drug use: No   Sexual activity: Not on file  Other Topics Concern   Not on file  Social History Narrative   epworth sleepiness scale score: 4   Social Determinants of Health   Financial Resource Strain: Low Risk  (07/14/2022)   Overall Financial Resource Strain (CARDIA)    Difficulty of  Paying Living Expenses: Not hard at all  Food Insecurity: No Food Insecurity (07/14/2022)   Hunger Vital Sign    Worried About Running Out of Food in the Last Year: Never true    Ran Out of Food in the Last Year: Never true  Transportation Needs: No Transportation Needs (07/14/2022)   PRAPARE - Administrator, Civil Service (Medical): No    Lack of Transportation (Non-Medical): No  Physical Activity: Insufficiently Active (07/14/2022)   Exercise Vital Sign    Days of Exercise per Week: 3 days    Minutes of Exercise per Session: 20 min  Stress: No Stress Concern Present (07/14/2022)   [employer] of Occupational Health - Occupational Stress Questionnaire    Feeling of Stress : Not at all  Social Connections: Socially Integrated (07/14/2022)   Social Connection and Isolation Panel [NHANES]    Frequency of Communication with Friends and Family: More than three times a week    Frequency of Social Gatherings with Friends and Family: More than three times a week    Attends Religious Services: More than 4 times per year    Active Member of [employer] or Organizations: Yes    Attends Engineer, structural: More than 4 times per year    Marital Status: Married  Catering manager Violence: Not At Risk (07/14/2022)   Humiliation, Afraid, Rape, and Kick questionnaire    Fear of Current or Ex-Partner: No    Emotionally Abused: No    Physically Abused: No    Sexually Abused: No    FAMILY HISTORY: Family History  Problem Relation Age of Onset   Heart disease Mother    Heart disease Sister    Colon cancer Neg Hx     ALLERGIES:  has No Known Allergies.  MEDICATIONS:  Current Outpatient Medications  Medication Sig Dispense Refill   allopurinol (ZYLOPRIM) 100 MG tablet TAKE 1 TABLET EVERY DAY 90 tablet 1   amLODipine (NORVASC) 10 MG tablet TAKE 1 TABLET EVERY DAY 90 tablet 1   atorvastatin (LIPITOR) 80 MG tablet TAKE 1 TABLET BY MOUTH EVERY DAY IN THE MORNING 90 tablet 0    colchicine 0.6 MG tablet Take 2 tabs (1.2mg ) at fist sign of gout flare.  Then take 1 tab 1 hour later.  On day 2 take one tab daily until flare stops. 30 tablet 0   Continuous Blood Gluc Receiver (FREESTYLE LIBRE 14 DAY READER) DEVI Use as directed 1 each 0   Continuous Blood Gluc Receiver (FREESTYLE LIBRE READER) DEVI Use with Freestyle Sensor to check blood sugar levels. 1 each 0   Continuous Blood Gluc Sensor (FREESTYLE LIBRE SENSOR SYSTEM) MISC Apply device as directed every 2 weeks. 2 each 11   gabapentin (NEURONTIN) 100 MG capsule TAKE 1 CAPSULE (  100 MG TOTAL) BY MOUTH AT BEDTIME AS NEEDED (PAIN). 90 capsule 1   glucose blood (ONE TOUCH ULTRA TEST) test strip 1 each by Other route daily. Use as instructed 100 each 12   Lancets (ONETOUCH ULTRASOFT) lancets 1 each by Other route daily. Use as instructed 100 each 12   lisinopril (ZESTRIL) 40 MG tablet TAKE 1 TABLET BY MOUTH EVERY DAY (Patient not taking: Reported on 04/05/2023) 90 tablet 1   LOKELMA 10 g PACK packet Take 1 packet by mouth daily.     metoprolol tartrate (LOPRESSOR) 25 MG tablet Take 0.5 tablets (12.5 mg total) by mouth 2 (two) times daily. (Patient not taking: Reported on 04/05/2023) 90 tablet 3   nitroGLYCERIN (NITROSTAT) 0.4 MG SL tablet Place 1 tablet (0.4 mg total) under the tongue every 5 (five) minutes as needed for chest pain. 15 tablet 5   sildenafil (VIAGRA) 25 MG tablet Take 1 tablet (25 mg total) by mouth daily as needed for erectile dysfunction. 10 tablet 1   tamsulosin (FLOMAX) 0.4 MG CAPS capsule TAKE 1 CAPSULE BY MOUTH EVERY DAY (Patient not taking: Reported on 04/05/2023) 90 capsule 1   traMADol (ULTRAM) 50 MG tablet TAKE 2 TABLETS BY MOUTH EVERY 12 HOURS AS NEEDED 120 tablet 1   No current facility-administered medications for this visit.    REVIEW OF SYSTEMS:   Constitutional: ( - ) fevers, ( - )  chills , ( - ) night sweats Eyes: ( - ) blurriness of vision, ( - ) double vision, ( - ) watery eyes Ears, nose,  mouth, throat, and face: ( - ) mucositis, ( - ) sore throat Respiratory: ( - ) cough, ( - ) dyspnea, ( - ) wheezes Cardiovascular: ( - ) palpitation, ( - ) chest discomfort, ( - ) lower extremity swelling Gastrointestinal:  ( - ) nausea, ( - ) heartburn, ( - ) change in bowel habits Skin: ( - ) abnormal skin rashes Lymphatics: ( - ) new lymphadenopathy, ( - ) easy bruising Neurological: ( - ) numbness, ( - ) tingling, ( - ) new weaknesses Behavioral/Psych: ( - ) mood change, ( - ) new changes  All other systems were reviewed with the patient and are negative.  PHYSICAL EXAMINATION:  There were no vitals filed for this visit.  There were no vitals filed for this visit.   GENERAL: Well-appearing elderly male, alert, no distress and comfortable SKIN: skin color, texture, turgor are normal, no rashes or significant lesions EYES: conjunctiva are pink and non-injected, sclera clear LUNGS: clear to auscultation and percussion with normal breathing effort HEART: regular rate & rhythm and no murmurs and no lower extremity edema Musculoskeletal: no cyanosis of digits and no clubbing  PSYCH: alert & oriented x 3, fluent speech NEURO: no focal motor/sensory deficits  LABORATORY DATA:  I have reviewed the data as listed    Latest Ref Rng & Units 07/12/2023   12:00 PM 01/11/2023    1:13 PM 07/20/2022   11:12 AM  CBC  WBC 4.0 - 10.5 K/uL 6.6  6.4  6.6   Hemoglobin 13.0 - 17.0 g/dL 60.4  54.0  98.1   Hematocrit 39.0 - 52.0 % 40.4  35.3  34.9   Platelets 150 - 400 K/uL 213  219  221        Latest Ref Rng & Units 07/12/2023   12:00 PM 01/11/2023    1:13 PM 07/20/2022   11:12 AM  CMP  Glucose 70 - 99  mg/dL 295  284  132   BUN 8 - 23 mg/dL 32  37  47   Creatinine 0.61 - 1.24 mg/dL 4.40  1.02  7.25   Sodium 135 - 145 mmol/L 138  136  136   Potassium 3.5 - 5.1 mmol/L 4.6  4.4  5.4   Chloride 98 - 111 mmol/L 105  105  111   CO2 22 - 32 mmol/L 27  24  20    Calcium 8.9 - 10.3 mg/dL 9.7  9.3  9.4    Total Protein 6.5 - 8.1 g/dL 7.7  7.7  7.3   Total Bilirubin 0.3 - 1.2 mg/dL 0.5  0.6  0.4   Alkaline Phos 38 - 126 U/L 78  78  81   AST 15 - 41 U/L 22  18  27    ALT 0 - 44 U/L 22  19  17      Lab Results  Component Value Date   MPROTEIN 0.7 (H) 07/12/2023   MPROTEIN 0.6 (H) 01/11/2023   MPROTEIN 0.6 (H) 07/20/2022   Lab Results  Component Value Date   KPAFRELGTCHN 104.9 (H) 07/12/2023   KPAFRELGTCHN 119.7 (H) 01/11/2023   KPAFRELGTCHN 120.7 (H) 07/20/2022   LAMBDASER 38.2 (H) 07/12/2023   LAMBDASER 39.7 (H) 01/11/2023   LAMBDASER 42.7 (H) 07/20/2022   KAPLAMBRATIO 2.75 (H) 07/12/2023   KAPLAMBRATIO 3.02 (H) 01/11/2023   KAPLAMBRATIO 2.83 (H) 07/20/2022    RADIOGRAPHIC STUDIES: No results found.  ASSESSMENT & PLAN [name] 77 y.o. male with medical history significant for an IgA kappa monoclonal gammopathy who presents for a follow up visit.   Monoclonal Gammopathies are a group of medical conditions defined by the presence of a monoclonal protein (an M protein) in the blood or urine. Monoclonal gammopathies include monoclonal gammopathy of unknown significance (MGUS), Monoclonal gammopathies of renal or neurological significance, smolder multiple myeloma (SMM), multiple myeloma (MM), AL amyloidosis, and Waldenstrom macroglobulinemia. The goal of the initial workup is to determine which monoclonal gammopathy a patient has. The workup consists of evaluating protein in the serum (with serum protein electrophoresis (SPEP) and serum free light chains) , evaluating protein in the urine (UPEP), and evaluation of the skeleton (DG Bone Met Survey) to assure no lytic lesions. Baseline bloodwork includes CMP and CBC. If no CRAB criteria or high risk criteria are noted then the diagnosis is MGUS. MGUS must be followed with bloodwork periodically to assure it does not convert to multiple myeloma (occurs to approximately 1% of patients per year). If there are CRAB criteria or high risk  features (such as elevated serum free light chain ratio (taking into account renal function), a non IgG M protein, or M protein >1.5) then a bone marrow biopsy must be pursued.  # IgA kappa Monoclonal Gammopathy of Undetermined Significance -- Initial findings concerning for a known IgG monoclonal gammopathy with chronic kidney disease.   --Bone marrow biopsy on 12/29/2021 showed 5% plasmacytosis with no evidence of a plasma cell neoplasm Plan:  -- At each visit will order an SPEP, SFLC, CBC, CMP, and LDH --recommend a metastatic bone survey to assess for lytic lesions and UPEP at least once yearly --labs today show creatinine *** --With his bump in creatinine we will reach out to his primary nephrologist.  Encouraged the patient to do the same. -- Return to clinic in 6 months time for further evaluation   No orders of the defined types were placed in this encounter.   All questions were  answered. The patient knows to call the clinic with any problems, questions or concerns.  A total of more than 25 minutes were spent on this encounter with face-to-face time and non-face-to-face time, including preparing to see the patient, ordering tests and/or medications, counseling the patient and coordination of care as outlined above.   Ulysees Barns, MD Department of Hematology/Oncology [facility] Phone: [phone] Pager: [phone] Email: [email or web address]  07/20/2023 1:41 PM   Literature Support:  1) Kyle RA, Durie BG, Rajkumar SV, et al. Monoclonal gammopathy of undetermined significance (MGUS) and smoldering (asymptomatic) multiple myeloma: IMWG consensus perspectives risk factors for progression and guidelines for monitoring and management. Leukemia. 2010;24(6):1121-1127. doi:10.1038/leu.2010.60   --If a patient with apparent MGUS has a serum monoclonal protein >15 g/l, IgA or IgM protein type, or an abnormal FLC ratio, a BM aspirate and  biopsy should be carried out at baseline to rule out underlying PC malignancy.   2) Ronaldo Miyamoto RA, Therneau TM, Rajkumar SV, et al. Prevalence of monoclonal gammopathy of undetermined significance. N Engl J Med 2006;354:1362-1369   --MGUS was found in 3.2 percent of persons 70 years of age or older and 5.3 percent of persons 90 years of age or older.

## 2023-07-31 ENCOUNTER — Other Ambulatory Visit: Payer: Self-pay

## 2023-07-31 DIAGNOSIS — E785 Hyperlipidemia, unspecified: Secondary | ICD-10-CM | POA: Diagnosis not present

## 2023-07-31 DIAGNOSIS — M109 Gout, unspecified: Secondary | ICD-10-CM | POA: Diagnosis not present

## 2023-07-31 DIAGNOSIS — Z79899 Other long term (current) drug therapy: Secondary | ICD-10-CM | POA: Diagnosis not present

## 2023-07-31 DIAGNOSIS — C9000 Multiple myeloma not having achieved remission: Secondary | ICD-10-CM | POA: Diagnosis not present

## 2023-07-31 DIAGNOSIS — D472 Monoclonal gammopathy: Secondary | ICD-10-CM | POA: Diagnosis not present

## 2023-07-31 DIAGNOSIS — I10 Essential (primary) hypertension: Secondary | ICD-10-CM | POA: Diagnosis not present

## 2023-07-31 DIAGNOSIS — Z800 Family history of malignant neoplasm of digestive organs: Secondary | ICD-10-CM | POA: Diagnosis not present

## 2023-07-31 DIAGNOSIS — I2510 Atherosclerotic heart disease of native coronary artery without angina pectoris: Secondary | ICD-10-CM | POA: Diagnosis not present

## 2023-07-31 DIAGNOSIS — Z87891 Personal history of nicotine dependence: Secondary | ICD-10-CM | POA: Diagnosis not present

## 2023-08-02 ENCOUNTER — Ambulatory Visit (HOSPITAL_COMMUNITY)
Admission: RE | Admit: 2023-08-02 | Discharge: 2023-08-02 | Disposition: A | Discharge status: Home / Self Care | Payer: Medicare PPO | Source: Ambulatory Visit | Attending: Hematology and Oncology | Admitting: Hematology and Oncology

## 2023-08-02 DIAGNOSIS — D472 Monoclonal gammopathy: Secondary | ICD-10-CM | POA: Insufficient documentation

## 2023-08-02 DIAGNOSIS — M47812 Spondylosis without myelopathy or radiculopathy, cervical region: Secondary | ICD-10-CM | POA: Diagnosis not present

## 2023-08-03 LAB — UPEP/UIFE/LIGHT CHAINS/TP, 24-HR UR
% BETA, Urine: 15.3 %
ALPHA 1 URINE: 0.9 %
Albumin, U: 66.4 %
Alpha 2, Urine: 3.1 %
Free Kappa Lt Chains,Ur: 45.71 mg/L (ref 1.17–86.46)
Free Kappa/Lambda Ratio: 7.79 (ref 1.83–14.26)
Free Lambda Lt Chains,Ur: 5.87 mg/L (ref 0.27–15.21)
GAMMA GLOBULIN URINE: 14.3 %
Total Protein, Urine-Ur/day: 86 mg/(24.h) (ref 30–150)
Total Protein, Urine: 9.5 mg/dL
Total Volume: 900

## 2023-08-04 ENCOUNTER — Other Ambulatory Visit: Payer: Self-pay | Admitting: Family Medicine

## 2023-08-04 DIAGNOSIS — M4726 Other spondylosis with radiculopathy, lumbar region: Secondary | ICD-10-CM

## 2023-08-26 ENCOUNTER — Other Ambulatory Visit: Payer: Self-pay | Admitting: Family Medicine

## 2023-08-26 DIAGNOSIS — E785 Hyperlipidemia, unspecified: Secondary | ICD-10-CM

## 2023-09-06 ENCOUNTER — Encounter: Payer: Self-pay | Admitting: Family Medicine

## 2023-09-06 ENCOUNTER — Ambulatory Visit: Payer: Medicare PPO | Admitting: Family Medicine

## 2023-09-06 VITALS — BP 152/80 | HR 80 | Temp 98.4°F | Ht 71.0 in | Wt 287.2 lb

## 2023-09-06 DIAGNOSIS — I10 Essential (primary) hypertension: Secondary | ICD-10-CM | POA: Diagnosis not present

## 2023-09-06 DIAGNOSIS — E1151 Type 2 diabetes mellitus with diabetic peripheral angiopathy without gangrene: Secondary | ICD-10-CM

## 2023-09-06 DIAGNOSIS — R42 Dizziness and giddiness: Secondary | ICD-10-CM | POA: Diagnosis not present

## 2023-09-06 DIAGNOSIS — Z23 Encounter for immunization: Secondary | ICD-10-CM

## 2023-09-06 LAB — POCT URINALYSIS DIPSTICK
Bilirubin, UA: NEGATIVE
Blood, UA: NEGATIVE
Glucose, UA: POSITIVE — AB
Ketones, UA: NEGATIVE
Leukocytes, UA: NEGATIVE
Nitrite, UA: NEGATIVE
Protein, UA: POSITIVE — AB
Spec Grav, UA: 1.015 (ref 1.010–1.025)
Urobilinogen, UA: 0.2 U/dL
pH, UA: 5.5 (ref 5.0–8.0)

## 2023-09-06 LAB — POCT GLYCOSYLATED HEMOGLOBIN (HGB A1C): Hemoglobin A1C: 6.8 % — AB (ref 4.0–5.6)

## 2023-09-06 NOTE — Patient Instructions (Addendum)
Your hemoglobin A1c is now 6.8%.    Your blood pressure seems to get lower when you are standing.  This can be contributing to your dizziness/off-balance feeling.  Make sure you are drinking plenty of water each day.  You should also consider wearing compression socks to help with symptoms.  A referral for you to see the cardiologist was placed to further evaluate the dizzy feeling.

## 2023-09-24 ENCOUNTER — Telehealth: Payer: Self-pay

## 2023-09-24 NOTE — Patient Outreach (Signed)
Attempted to contact patient regarding DM eye. Left voicemail for patient to return my call at [phone].  Nicholes Rough, CMA Care Guide [employer]

## 2023-10-19 ENCOUNTER — Encounter: Payer: Self-pay | Admitting: Family Medicine

## 2023-10-19 ENCOUNTER — Ambulatory Visit (INDEPENDENT_AMBULATORY_CARE_PROVIDER_SITE_OTHER): Payer: Medicare PPO | Admitting: Family Medicine

## 2023-10-19 VITALS — BP 162/80 | HR 97 | Temp 98.4°F | Ht 71.0 in | Wt 277.2 lb

## 2023-10-19 DIAGNOSIS — R42 Dizziness and giddiness: Secondary | ICD-10-CM

## 2023-10-19 DIAGNOSIS — E1122 Type 2 diabetes mellitus with diabetic chronic kidney disease: Secondary | ICD-10-CM

## 2023-10-19 DIAGNOSIS — N183 Chronic kidney disease, stage 3 unspecified: Secondary | ICD-10-CM

## 2023-10-19 DIAGNOSIS — I10 Essential (primary) hypertension: Secondary | ICD-10-CM | POA: Diagnosis not present

## 2023-10-19 DIAGNOSIS — R413 Other amnesia: Secondary | ICD-10-CM

## 2023-10-19 DIAGNOSIS — I6523 Occlusion and stenosis of bilateral carotid arteries: Secondary | ICD-10-CM

## 2023-10-19 DIAGNOSIS — Z7984 Long term (current) use of oral hypoglycemic drugs: Secondary | ICD-10-CM

## 2023-10-19 DIAGNOSIS — I25810 Atherosclerosis of coronary artery bypass graft(s) without angina pectoris: Secondary | ICD-10-CM

## 2023-10-19 NOTE — Progress Notes (Unsigned)
Established Patient Office Visit   Subjective  Patient ID: [name], male    DOB: Nov 01, 1946  Age: 77 y.o. MRN: [id number]  Chief Complaint  Patient presents with  . Dizziness    Follow-up dizziness and fatigue, Bp @ home is 140/80, coughing in the morning     HPI Patient states Tylenol has same effect as tramadol.  Not really having pain but perhaps soreness. Patient Active Problem List   Diagnosis Date Noted  . Intracranial carotid stenosis, bilateral 10/26/2021  . Acute renal failure superimposed on stage 3a chronic kidney disease (HCC) 10/23/2021  . Aphasia 10/23/2021  . Hyponatremia 10/23/2021  . Hyperkalemia 10/23/2021  . TIA (transient ischemic attack) 10/23/2021  . Metabolic acidosis 10/23/2021  . Elevated blood-pressure reading, without diagnosis of hypertension 04/28/2021  . Body mass index (BMI) 40.0-44.9, adult (HCC) 04/28/2021  . Lumbar spondylosis 04/28/2021  . ETOH abuse 08/10/2020  . Nocturia 08/10/2020  . Heart burn 08/10/2020  . Left hip pain 06/06/2018  . DM (diabetes mellitus) type II controlled with renal manifestation (HCC) 12/01/2016  . Medicare annual wellness visit, subsequent 12/01/2016  . BPH (benign prostatic hyperplasia) 01/28/2015  . Type 2 diabetes mellitus with peripheral vascular disease (HCC) 06/20/2013  . 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 Herpes zoster 04/15/2010  . ERECTILE DYSFUNCTION 01/14/2009  . Occlusion and stenosis of carotid artery 09/22/2008  . Coronary atherosclerosis 04/03/2008  . CAD, ARTERY BYPASS GRAFT 02/10/2008  . Dyslipidemia 01/16/2008  . GOUT 01/16/2008  . Essential hypertension 01/16/2008   Past Medical History:  Diagnosis Date  . CAD, ARTERY BYPASS GRAFT 02/10/2008  . CAROTID ARTERY STENOSIS, WITHOUT INFARCTION 09/22/2008  . CORONARY ARTERY DISEASE 04/03/2008  . Diabetes mellitus    "borderline"  . ERECTILE DYSFUNCTION 01/14/2009  . GOUT 01/16/2008  . HERPES ZOSTER 04/15/2010  .  HYPERLIPIDEMIA 01/16/2008  . HYPERTENSION 01/16/2008  . IMPAIRED GLUCOSE TOLERANCE 08/22/2010  . Shingles 2011   left chest  . Ulcer 30 yrs ago   stomach   Past Surgical History:  Procedure Laterality Date  . COLONOSCOPY     3 months ago  . CORONARY ARTERY BYPASS GRAFT  2009   vessels x3  . EAR CYST EXCISION N/A 07/29/2013   Procedure: excision of urachal cyst flexible cystoscopy insertion of foley cath;  Surgeon: Antony Haste, MD;  Location: [facility];  Service: Urology;  Laterality: N/A;  . LAPAROSCOPY N/A 07/29/2013   Procedure: LAPAROSCOPY DIAGNOSTIC  laparoscopic excision of urachal cyst ;  Surgeon: Lodema Pilot, DO;  Location: [facility];  Service: General;  Laterality: N/A;  . LAPAROTOMY  08/25/2012   Procedure: EXPLORATORY LAPAROTOMY;  Surgeon: Lodema Pilot, DO;  Location: [facility];  Service: General;  Laterality: N/A;  incision and drainage abdominal wall abcessand intra abdominal wall abcess  . PARTIAL GASTRECTOMY  1974   bleeding ulcers  . urachal  2014   Urachal cyst removal   Social History   Tobacco Use  . Smoking status: Former    Current packs/day: 0.00    Average packs/day: 1 pack/day for 10.0 years (10.0 ttl pk-yrs)    Types: Cigarettes    Start date: 07/26/1989    Quit date: 07/27/1999    Years since quitting: 24.2  . Smokeless tobacco: Never  Substance Use Topics  . Alcohol use: Yes    Comment: 4 beers a day  . Drug use: No   Family History  Problem Relation Age of Onset  . Heart disease Mother   .  Heart disease Sister   . Colon cancer Neg Hx    No Known Allergies    ROS Negative unless stated above    Objective:     BP (!) 162/80 (BP Location: Left Arm, Patient Position: Sitting, Cuff Size: Large)   Pulse 97   Temp 98.4 F (36.9 C) (Oral)   Ht 5\' 11"  (1.803 m)   Wt 277 lb 3.2 oz (125.7 kg)   SpO2 95%   BMI 38.66 kg/m  BP Readings from Last 3 Encounters:  10/19/23 (!) 162/80  09/06/23 (!) 152/80  07/20/23 (!) 145/73   Wt Readings  from Last 3 Encounters:  10/19/23 277 lb 3.2 oz (125.7 kg)  09/06/23 287 lb 3.2 oz (130.3 kg)  07/20/23 286 lb 4.8 oz (129.9 kg)      Physical Exam   No results found for any visits on 10/19/23.    Assessment & Plan:  Atherosclerosis of coronary artery bypass graft of native heart, unspecified whether angina present  Patient advised to wean off of tramadol and use Tylenol as needed for discomfort.  No follow-ups on file.   Deeann Saint, MD

## 2023-10-20 LAB — CBC WITH DIFFERENTIAL/PLATELET
Absolute Lymphocytes: 1432 {cells}/uL (ref 850–3900)
Absolute Monocytes: 583 {cells}/uL (ref 200–950)
Basophils Absolute: 62 {cells}/uL (ref 0–200)
Basophils Relative: 1 %
Eosinophils Absolute: 68 {cells}/uL (ref 15–500)
Eosinophils Relative: 1.1 %
HCT: 43.7 % (ref 38.5–50.0)
Hemoglobin: 14.3 g/dL (ref 13.2–17.1)
MCH: 31.4 pg (ref 27.0–33.0)
MCHC: 32.7 g/dL (ref 32.0–36.0)
MCV: 95.8 fL (ref 80.0–100.0)
MPV: 11.6 fL (ref 7.5–12.5)
Monocytes Relative: 9.4 %
Neutro Abs: 4055 {cells}/uL (ref 1500–7800)
Neutrophils Relative %: 65.4 %
Platelets: 275 10*3/uL (ref 140–400)
RBC: 4.56 10*6/uL (ref 4.20–5.80)
RDW: 13.9 % (ref 11.0–15.0)
Total Lymphocyte: 23.1 %
WBC: 6.2 10*3/uL (ref 3.8–10.8)

## 2023-10-20 LAB — COMPREHENSIVE METABOLIC PANEL
AG Ratio: 0.9 (calc) — ABNORMAL LOW (ref 1.0–2.5)
ALT: 41 U/L (ref 9–46)
AST: 32 U/L (ref 10–35)
Albumin: 3.7 g/dL (ref 3.6–5.1)
Alkaline phosphatase (APISO): 182 U/L — ABNORMAL HIGH (ref 35–144)
BUN/Creatinine Ratio: 17 (calc) (ref 6–22)
BUN: 25 mg/dL (ref 7–25)
CO2: 27 mmol/L (ref 20–32)
Calcium: 9.9 mg/dL (ref 8.6–10.3)
Chloride: 105 mmol/L (ref 98–110)
Creat: 1.5 mg/dL — ABNORMAL HIGH (ref 0.70–1.28)
Globulin: 4 g/dL — ABNORMAL HIGH (ref 1.9–3.7)
Glucose, Bld: 168 mg/dL — ABNORMAL HIGH (ref 65–99)
Potassium: 4.4 mmol/L (ref 3.5–5.3)
Sodium: 140 mmol/L (ref 135–146)
Total Bilirubin: 0.8 mg/dL (ref 0.2–1.2)
Total Protein: 7.7 g/dL (ref 6.1–8.1)

## 2023-10-20 LAB — IRON,TIBC AND FERRITIN PANEL
%SAT: 21 % (ref 20–48)
Ferritin: 54 ng/mL (ref 24–380)
Iron: 65 ug/dL (ref 50–180)
TIBC: 314 ug/dL (ref 250–425)

## 2023-10-20 LAB — T4, FREE: Free T4: 0.9 ng/dL (ref 0.8–1.8)

## 2023-10-20 LAB — MAGNESIUM: Magnesium: 2 mg/dL (ref 1.5–2.5)

## 2023-10-20 LAB — TSH: TSH: 0.51 m[IU]/L (ref 0.40–4.50)

## 2023-10-21 DIAGNOSIS — D472 Monoclonal gammopathy: Secondary | ICD-10-CM | POA: Insufficient documentation

## 2023-10-21 DIAGNOSIS — N2581 Secondary hyperparathyroidism of renal origin: Secondary | ICD-10-CM | POA: Insufficient documentation

## 2023-10-23 ENCOUNTER — Ambulatory Visit: Payer: Medicare PPO | Attending: Cardiovascular Disease | Admitting: Cardiovascular Disease

## 2023-10-23 ENCOUNTER — Encounter: Payer: Self-pay | Admitting: Cardiovascular Disease

## 2023-10-23 VITALS — BP 117/77 | HR 110 | Ht 71.0 in | Wt 278.0 lb

## 2023-10-23 DIAGNOSIS — I350 Nonrheumatic aortic (valve) stenosis: Secondary | ICD-10-CM | POA: Diagnosis not present

## 2023-10-23 DIAGNOSIS — E785 Hyperlipidemia, unspecified: Secondary | ICD-10-CM

## 2023-10-23 DIAGNOSIS — I2510 Atherosclerotic heart disease of native coronary artery without angina pectoris: Secondary | ICD-10-CM | POA: Diagnosis not present

## 2023-10-23 DIAGNOSIS — I25810 Atherosclerosis of coronary artery bypass graft(s) without angina pectoris: Secondary | ICD-10-CM | POA: Diagnosis not present

## 2023-10-23 DIAGNOSIS — I10 Essential (primary) hypertension: Secondary | ICD-10-CM

## 2023-10-23 NOTE — Patient Instructions (Addendum)
Medication Instructions:  Your physician recommends that you continue on your current medications as directed. Please refer to the Current Medication list given to you today.  *If you need a refill on your cardiac medications before your next appointment, please call your pharmacy*   Testing/Procedures: Your physician has requested that you have an echocardiogram. Echocardiography is a painless test that uses sound waves to create images of your heart. It provides your doctor with information about the size and shape of your heart and how well your heart's chambers and valves are working. This procedure takes approximately one hour. There are no restrictions for this procedure. Please do NOT wear cologne, perfume, aftershave, or lotions (deodorant is allowed). Please arrive 15 minutes prior to your appointment time. **To do in December 2025**  Please note: We ask at that you not bring children with you during ultrasound (echo/ vascular) testing. Due to room size and safety concerns, children are not allowed in the ultrasound rooms during exams. Our front office staff cannot provide observation of children in our lobby area while testing is being conducted. An adult accompanying a patient to their appointment will only be allowed in the ultrasound room at the discretion of the ultrasound technician under special circumstances. We apologize for any inconvenience.    Follow-Up: At [facility], you and your health needs are our priority.  As part of our continuing mission to provide you with exceptional heart care, we have created designated Provider Care Teams.  These Care Teams include your primary Cardiologist (physician) and Advanced Practice Providers (APPs -  Physician Assistants and Nurse Practitioners) who all work together to provide you with the care you need, when you need it.  We recommend signing up for the patient portal called "MyChart".  Sign up information is provided on this  After Visit Summary.  MyChart is used to connect with patients for Virtual Visits (Telemedicine).  Patients are able to view lab/test results, encounter notes, upcoming appointments, etc.  Non-urgent messages can be sent to your provider as well.   To learn more about what you can do with MyChart, go to [email or web address].    Your next appointment:   12 month(s)  Provider:   Nanetta Batty, MD

## 2023-10-23 NOTE — Progress Notes (Signed)
10/23/2023 [name]   1946-03-31  [id number]  Primary Physician Deeann Saint, MD Primary Cardiologist: Runell Gess MD [name], [place]  HPI:  [name] is a 77 y.o.  moderately overweight married male father of 2, grandfather 2 grandchildren referred by Dr. Christiana Pellant for cardiovascular evaluation because of an episode of chest pain and an abnormal Myoview stress test. I last saw him in the office 08/03/2017. He has a history of treated hypertension, diabetes and hyperlipidemia. He has known CAD status post cardiac catheterization performed by Dr. Excell Seltzer 01/28/08 revealing three-vessel disease with preserved LV function. He also underwent coronary artery bypass grafting by Dr. Tyrone Sage were/6/09 the LIMA to the LAD, vein graft sequentially to an intermediate branch and obtuse marginal branch as well as the distal right coronary artery. He's been well since. Episode of chest pain several months ago and a recent Myoview stress test performed 01/08/17 showed inferolateral scar with moderate peri-infarct ischemia.  Since I saw him 6 years ago he is remained stable.  He does have some mild orthostatic symptoms with a drop in blood pressure 20 mmHg going from lying to standing.  He denies chest pain or shortness of breath.  Did have an echo performed 10/23/2021 that revealed normal LV systolic function, grade 1 diastolic dysfunction with mild to moderate aortic stenosis.   Current Meds  Medication Sig   allopurinol (ZYLOPRIM) 100 MG tablet TAKE 1 TABLET EVERY DAY   amLODipine (NORVASC) 10 MG tablet TAKE 1 TABLET EVERY DAY   atorvastatin (LIPITOR) 80 MG tablet TAKE 1 TABLET BY MOUTH EVERY DAY IN THE MORNING   colchicine 0.6 MG tablet Take 2 tabs (1.2mg ) at fist sign of gout flare.  Then take 1 tab 1 hour later.  On day 2 take one tab daily until flare stops.   Continuous Blood Gluc Receiver (FREESTYLE LIBRE 14 DAY READER) DEVI Use as directed   Continuous Blood Gluc  Receiver (FREESTYLE LIBRE READER) DEVI Use with Freestyle Sensor to check blood sugar levels.   dapagliflozin propanediol (FARXIGA) 10 MG TABS tablet Take 10 mg by mouth daily.   gabapentin (NEURONTIN) 100 MG capsule TAKE 1 CAPSULE (100 MG TOTAL) BY MOUTH AT BEDTIME AS NEEDED (PAIN).   glucose blood (ONE TOUCH ULTRA TEST) test strip 1 each by Other route daily. Use as instructed   Lancets (ONETOUCH ULTRASOFT) lancets 1 each by Other route daily. Use as instructed   LOKELMA 10 g PACK packet Take 1 packet by mouth daily.   nitroGLYCERIN (NITROSTAT) 0.4 MG SL tablet Place 1 tablet (0.4 mg total) under the tongue every 5 (five) minutes as needed for chest pain.   sildenafil (VIAGRA) 25 MG tablet Take 1 tablet (25 mg total) by mouth daily as needed for erectile dysfunction.   traMADol (ULTRAM) 50 MG tablet TAKE 2 TABLETS BY MOUTH EVERY 12 HOURS AS NEEDED     No Known Allergies  Social History   Socioeconomic History   Marital status: Married    Spouse name: Not on file   Number of children: 3   Years of education: Not on file   Highest education level: Not on file  Occupational History    Employer: [employer]  Tobacco Use   Smoking status: Former    Current packs/day: 0.00    Average packs/day: 1 pack/day for 10.0 years (10.0 ttl pk-yrs)    Types: Cigarettes    Start date: 07/26/1989    Quit date:  07/27/1999    Years since quitting: 24.2   Smokeless tobacco: Never  Substance and Sexual Activity   Alcohol use: Yes    Comment: 4 beers a day   Drug use: No   Sexual activity: Not on file  Other Topics Concern   Not on file  Social History Narrative   epworth sleepiness scale score: 4   Social Drivers of Corporate investment banker Strain: Low Risk  (07/14/2022)   Overall Financial Resource Strain (CARDIA)    Difficulty of Paying Living Expenses: Not hard at all  Food Insecurity: No Food Insecurity (07/14/2022)   Hunger Vital Sign    Worried About Running Out of Food in the Last  Year: Never true    Ran Out of Food in the Last Year: Never true  Transportation Needs: No Transportation Needs (07/14/2022)   PRAPARE - Administrator, Civil Service (Medical): No    Lack of Transportation (Non-Medical): No  Physical Activity: Insufficiently Active (07/14/2022)   Exercise Vital Sign    Days of Exercise per Week: 3 days    Minutes of Exercise per Session: 20 min  Stress: No Stress Concern Present (07/14/2022)   [employer] of Occupational Health - Occupational Stress Questionnaire    Feeling of Stress : Not at all  Social Connections: Socially Integrated (07/14/2022)   Social Connection and Isolation Panel [NHANES]    Frequency of Communication with Friends and Family: More than three times a week    Frequency of Social Gatherings with Friends and Family: More than three times a week    Attends Religious Services: More than 4 times per year    Active Member of [employer] or Organizations: Yes    Attends Engineer, structural: More than 4 times per year    Marital Status: Married  Catering manager Violence: Not At Risk (07/14/2022)   Humiliation, Afraid, Rape, and Kick questionnaire    Fear of Current or Ex-Partner: No    Emotionally Abused: No    Physically Abused: No    Sexually Abused: No     Review of Systems: General: negative for chills, fever, night sweats or weight changes.  Cardiovascular: negative for chest pain, dyspnea on exertion, edema, orthopnea, palpitations, paroxysmal nocturnal dyspnea or shortness of breath Dermatological: negative for rash Respiratory: negative for cough or wheezing Urologic: negative for hematuria Abdominal: negative for nausea, vomiting, diarrhea, bright red blood per rectum, melena, or hematemesis Neurologic: negative for visual changes, syncope, or dizziness All other systems reviewed and are otherwise negative except as noted above.    Blood pressure 117/77, pulse (!) 110, height 5\' 11"  (1.803 m), weight 278  lb (126.1 kg), SpO2 93%.  General appearance: alert and no distress Neck: no adenopathy, no carotid bruit, no JVD, supple, symmetrical, trachea midline, and thyroid not enlarged, symmetric, no tenderness/mass/nodules Lungs: clear to auscultation bilaterally Heart: regular rate and rhythm, S1, S2 normal, no murmur, click, rub or gallop Extremities: extremities normal, atraumatic, no cyanosis or edema Pulses: 2+ and symmetric Skin: Skin color, texture, turgor normal. No rashes or lesions Neurologic: Grossly normal  EKG EKG Interpretation Date/Time:  Tuesday October 23 2023 15:00:03 EST Ventricular Rate:  101 PR Interval:  192 QRS Duration:  98 QT Interval:  348 QTC Calculation: 451 R Axis:   5  Text Interpretation: Sinus tachycardia Minimal voltage criteria for LVH, may be normal variant ( R in aVL ) When compared with ECG of 23-Oct-2021 02:32, PREVIOUS ECG IS PRESENT Confirmed  by Nanetta Batty [phone]) on 10/23/2023 3:01:40 PM    ASSESSMENT AND PLAN:   Dyslipidemia History of dyslipidemia on high-dose statin therapy with lipid profile performed 10/24/2021 revealing total cholesterol 115, LDL 44 and HDL 63.  Essential hypertension History of essential hypertension with blood pressure measured today at 106/60.  He is on amlodipine.  There was a question of orthostatic hypotension.  His lying blood pressure was 130/90 with a pulse of 96, sitting 117/77 with a pulse of 110 and standing, 109/70 and a pulse of 108.  He did have a 20 mm blood pressure drop from lying this standing.  I told him to change positions slowly.  I do not think anything else needs to be done at this time.  CAD, ARTERY BYPASS GRAFT History of CAD status post cardiac catheterization performed by Dr. Excell Seltzer 01/24/2008 revealing three-vessel disease with preserved LV function.  When CABG by Dr. Tyrone Sage 02/10/08 with a LIMA to his LAD, vein sequentially to the intermediate branch and obtuse marginal branch as well as the  distal RCA.  He did have a Myoview performed 01/08/2017 that showed inferolateral scar with mild peri-infarct ischemia.  His most recent 2D echo performed 10/23/2021 revealed normal LV systolic function with grade 1 diastolic dysfunction.  Aortic stenosis 2D echo performed 10/23/2021 showed normal LV systolic function, grade 1 diastolic dysfunction with mild to moderate aortic stenosis.  His aortic valve area measured 1.77 cm with a peak gradient of 37 mmHg.  Will recheck this in 12 months.     Runell Gess MD FACP,FACC,FAHA, [facility] 10/23/2023 3:22 PM

## 2023-10-23 NOTE — Assessment & Plan Note (Signed)
History of dyslipidemia on high-dose statin therapy with lipid profile performed 10/24/2021 revealing total cholesterol 115, LDL 44 and HDL 63.

## 2023-10-23 NOTE — Assessment & Plan Note (Signed)
History of CAD status post cardiac catheterization performed by Dr. Excell Seltzer 01/24/2008 revealing three-vessel disease with preserved LV function.  When CABG by Dr. Tyrone Sage 02/10/08 with a LIMA to his LAD, vein sequentially to the intermediate branch and obtuse marginal branch as well as the distal RCA.  He did have a Myoview performed 01/08/2017 that showed inferolateral scar with mild peri-infarct ischemia.  His most recent 2D echo performed 10/23/2021 revealed normal LV systolic function with grade 1 diastolic dysfunction.

## 2023-10-23 NOTE — Assessment & Plan Note (Signed)
History of essential hypertension with blood pressure measured today at 106/60.  He is on amlodipine.  There was a question of orthostatic hypotension.  His lying blood pressure was 130/90 with a pulse of 96, sitting 117/77 with a pulse of 110 and standing, 109/70 and a pulse of 108.  He did have a 20 mm blood pressure drop from lying this standing.  I told him to change positions slowly.  I do not think anything else needs to be done at this time.

## 2023-10-23 NOTE — Assessment & Plan Note (Signed)
2D echo performed 10/23/2021 showed normal LV systolic function, grade 1 diastolic dysfunction with mild to moderate aortic stenosis.  His aortic valve area measured 1.77 cm with a peak gradient of 37 mmHg.  Will recheck this in 12 months.

## 2023-10-28 ENCOUNTER — Other Ambulatory Visit: Payer: Self-pay | Admitting: Family Medicine

## 2023-11-06 ENCOUNTER — Other Ambulatory Visit: Payer: Self-pay

## 2023-11-06 DIAGNOSIS — D472 Monoclonal gammopathy: Secondary | ICD-10-CM

## 2023-11-06 DIAGNOSIS — I10 Essential (primary) hypertension: Secondary | ICD-10-CM

## 2023-11-16 ENCOUNTER — Other Ambulatory Visit (INDEPENDENT_AMBULATORY_CARE_PROVIDER_SITE_OTHER): Payer: Medicare (Managed Care)

## 2023-11-16 DIAGNOSIS — D472 Monoclonal gammopathy: Secondary | ICD-10-CM

## 2023-11-16 DIAGNOSIS — I10 Essential (primary) hypertension: Secondary | ICD-10-CM | POA: Diagnosis not present

## 2023-11-16 LAB — COMPREHENSIVE METABOLIC PANEL
ALT: 49 U/L (ref 0–53)
AST: 40 U/L — ABNORMAL HIGH (ref 0–37)
Albumin: 3.6 g/dL (ref 3.5–5.2)
Alkaline Phosphatase: 243 U/L — ABNORMAL HIGH (ref 39–117)
BUN: 31 mg/dL — ABNORMAL HIGH (ref 6–23)
CO2: 27 meq/L (ref 19–32)
Calcium: 9.8 mg/dL (ref 8.4–10.5)
Chloride: 100 meq/L (ref 96–112)
Creatinine, Ser: 1.62 mg/dL — ABNORMAL HIGH (ref 0.40–1.50)
GFR: 41.33 mL/min — ABNORMAL LOW (ref >60.00)
Glucose, Bld: 181 mg/dL — ABNORMAL HIGH (ref 70–99)
Potassium: 4.3 meq/L (ref 3.5–5.1)
Sodium: 137 meq/L (ref 135–145)
Total Bilirubin: 0.9 mg/dL (ref 0.2–1.2)
Total Protein: 7.8 g/dL (ref 6.0–8.3)

## 2023-11-16 LAB — GAMMA GT: GGT: 562 U/L — ABNORMAL HIGH (ref 7–51)

## 2023-11-23 ENCOUNTER — Encounter: Payer: Self-pay | Admitting: Family Medicine

## 2023-11-23 ENCOUNTER — Other Ambulatory Visit: Payer: Self-pay | Admitting: Family Medicine

## 2023-11-23 ENCOUNTER — Telehealth: Payer: Self-pay | Admitting: Family Medicine

## 2023-11-23 DIAGNOSIS — R748 Abnormal levels of other serum enzymes: Secondary | ICD-10-CM

## 2023-11-23 DIAGNOSIS — R7401 Elevation of levels of liver transaminase levels: Secondary | ICD-10-CM

## 2023-11-23 DIAGNOSIS — R42 Dizziness and giddiness: Secondary | ICD-10-CM

## 2023-11-23 NOTE — Telephone Encounter (Signed)
Pt called regarding lab results.  Alk phos remains elevated and GGT elevated.  Mild elevation in AST now seen.  Will place order for RUQ ultrasound.  Patient mentions continued dizziness.  No improvement in symptoms after cardiology visit.  Patient tried over-the-counter medication for vertigo which seemed to help.  Discussed vestibular rehab.  Order placed for PT.  Abbe Amsterdam, MD

## 2023-11-27 ENCOUNTER — Other Ambulatory Visit: Payer: Self-pay | Admitting: Family Medicine

## 2023-11-27 DIAGNOSIS — E785 Hyperlipidemia, unspecified: Secondary | ICD-10-CM

## 2023-11-29 ENCOUNTER — Ambulatory Visit
Admission: RE | Admit: 2023-11-29 | Discharge: 2023-11-29 | Disposition: A | Discharge status: Home / Self Care | Payer: Medicare (Managed Care) | Source: Ambulatory Visit | Attending: Family Medicine | Admitting: Family Medicine

## 2023-11-29 DIAGNOSIS — R748 Abnormal levels of other serum enzymes: Secondary | ICD-10-CM

## 2023-11-29 DIAGNOSIS — R7401 Elevation of levels of liver transaminase levels: Secondary | ICD-10-CM

## 2023-11-29 NOTE — Therapy (Signed)
OUTPATIENT PHYSICAL THERAPY VESTIBULAR EVALUATION     Patient Name: [name] MRN: [id number] DOB:[birth year], 78 y.o., male Today's Date: 12/04/2023  END OF SESSION:  PT End of Session - 12/04/23 1444     Visit Number 1    Number of Visits 7    Date for PT Re-Evaluation 01/15/24    Authorization Type [employer]    PT Start Time 1406    PT Stop Time 1441    PT Time Calculation (min) 35 min    Activity Tolerance Patient tolerated treatment well    Behavior During Therapy [facility] for tasks assessed/performed             Past Medical History:  Diagnosis Date   CAD, ARTERY BYPASS GRAFT 02/10/2008   CAROTID ARTERY STENOSIS, WITHOUT INFARCTION 09/22/2008   CORONARY ARTERY DISEASE 04/03/2008   Diabetes mellitus    "borderline"   ERECTILE DYSFUNCTION 01/14/2009   GOUT 01/16/2008   HERPES ZOSTER 04/15/2010   HYPERLIPIDEMIA 01/16/2008   HYPERTENSION 01/16/2008   IMPAIRED GLUCOSE TOLERANCE 08/22/2010   Shingles 2011   left chest   Ulcer 30 yrs ago   stomach   Past Surgical History:  Procedure Laterality Date   COLONOSCOPY     3 months ago   CORONARY ARTERY BYPASS GRAFT  2009   vessels x3   EAR CYST EXCISION N/A 07/29/2013   Procedure: excision of urachal cyst flexible cystoscopy insertion of foley cath;  Surgeon: Antony Haste, MD;  Location: [facility];  Service: Urology;  Laterality: N/A;   LAPAROSCOPY N/A 07/29/2013   Procedure: LAPAROSCOPY DIAGNOSTIC  laparoscopic excision of urachal cyst ;  Surgeon: Lodema Pilot, DO;  Location: [facility];  Service: General;  Laterality: N/A;   LAPAROTOMY  08/25/2012   Procedure: EXPLORATORY LAPAROTOMY;  Surgeon: Lodema Pilot, DO;  Location: [facility];  Service: General;  Laterality: N/A;  incision and drainage abdominal wall abcessand intra abdominal wall abcess   PARTIAL GASTRECTOMY  1974   bleeding ulcers   urachal  2014   Urachal cyst removal   Patient Active Problem List   Diagnosis Date Noted   Fatty liver 11/30/2023    Calculus of gallbladder without cholecystitis without obstruction 11/30/2023   Aortic stenosis 10/23/2023   MGUS (monoclonal gammopathy of unknown significance) 10/21/2023   Secondary hyperparathyroidism (HCC) 10/21/2023   Intracranial carotid stenosis, bilateral 10/26/2021   Acute renal failure superimposed on stage 3a chronic kidney disease (HCC) 10/23/2021   Aphasia 10/23/2021   Hyponatremia 10/23/2021   Hyperkalemia 10/23/2021   TIA (transient ischemic attack) 10/23/2021   Metabolic acidosis 10/23/2021   Elevated blood-pressure reading, without diagnosis of hypertension 04/28/2021   Body mass index (BMI) 40.0-44.9, adult (HCC) 04/28/2021   Lumbar spondylosis 04/28/2021   ETOH abuse 08/10/2020   Nocturia 08/10/2020   Heart burn 08/10/2020   Left hip pain 06/06/2018   DM (diabetes mellitus) type II controlled with renal manifestation (HCC) 12/01/2016   Medicare annual wellness visit, subsequent 12/01/2016   BPH (benign prostatic hyperplasia) 01/28/2015   Type 2 diabetes mellitus with peripheral vascular disease (HCC) 06/20/2013   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 07/26/2012   Herpes zoster 04/15/2010   ERECTILE DYSFUNCTION 01/14/2009   Occlusion and stenosis of carotid artery 09/22/2008   Coronary atherosclerosis 04/03/2008   CAD, ARTERY BYPASS GRAFT 02/10/2008   Dyslipidemia 01/16/2008   GOUT 01/16/2008   Essential hypertension 01/16/2008    PCP: Deeann Saint, MD  REFERRING PROVIDER: Deeann Saint, MD   REFERRING  DIAG: R42 (ICD-10-CM) - Dizziness  THERAPY DIAG:  Dizziness and giddiness  Unsteadiness on feet  Other abnormalities of gait and mobility  ONSET DATE: couple months ago  Rationale for Evaluation and Treatment: Rehabilitation  SUBJECTIVE:   SUBJECTIVE STATEMENT: Patient reports dizziness first started a couple months ago. First started while working when bending over. Episodes last seconds. Reports imbalance when standing still for prolonged  periods. Denies head trauma, double vision, hearing loss, tinnitus, migraines.Reports having a stomach virus around the time dizziness started. Reports occasional blurred vision in the AM or when first driving.   Pt accompanied by: self  PERTINENT HISTORY: DM, gout, HLT, HTN, CABG 2009  PAIN:  Are you having pain? No  PRECAUTIONS: Fall  RED FLAGS: None   WEIGHT BEARING RESTRICTIONS: No  FALLS: Has patient fallen in last 6 months? No  LIVING ENVIRONMENT: Lives with: lives with their spouse Lives in: House/apartment Stairs:  1 step to enter; 1 story home Has following equipment at home: Dan Humphreys - 2 wheeled and Wheelchair (manual)  PLOF: Independent- works part time at [employer]- standing, bending  PATIENT GOALS: improve dizziness   OBJECTIVE:  Note: Objective measures were completed at Evaluation unless otherwise noted.  DIAGNOSTIC FINDINGS: none recent  COGNITION: Overall cognitive status: Within functional limits for tasks assessed   SENSATION: Pt denies N/T in UEs/LEs   POSTURE:  rounded shoulders and forward head  GAIT: Gait pattern: Wide BOS with lateral trunk lean, reduced step length, slowed Assistive device utilized: None Level of assistance: Modified independence  PATIENT SURVEYS:  DHI: 34   VESTIBULAR ASSESSMENT:  GENERAL OBSERVATION: pt wears readers   OCULOMOTOR EXAM:  Ocular Alignment: normal  Ocular ROM: No Limitations  Spontaneous Nystagmus: absent  Gaze-Induced Nystagmus: absent  Smooth Pursuits: intact  Saccades: intact  VESTIBULAR - OCULAR REFLEX:   Slow VOR: Comment: c/o wooziness horizontal and avoids R side; c/o wooziness vertical   VOR Cancellation: Normal  Head-Impulse Test: HIT Right: negative HIT Left: possible positive      POSITIONAL TESTING:  Right Roll Test: negative Left Roll Test: negative  Right Sidelying: negative; c/o mild wooziness upon sitting up Left Sidelying: negative                                                                                                                               TREATMENT DATE: 12/04/23   PATIENT EDUCATION: Education details: prognosis, POC, HEP Person educated: Patient Education method: Explanation, Demonstration, Tactile cues, Verbal cues, and Handouts Education comprehension: verbalized understanding and returned demonstration  HOME EXERCISE PROGRAM: Access Code: [email or web address] URL: [email or web address] Date: 12/04/2023 Prepared by: [facility] - Outpatient  Rehab - [facility]  Exercises - Seated Gaze Stabilization with Head Rotation  - 1 x daily - 5 x weekly - 2-3 sets - 30 sec hold - Seated Gaze Stabilization with Head Nod  - 1 x daily - 5 x weekly - 2-3 sets - 30 sec  hold   GOALS: Goals reviewed with patient? Yes  SHORT TERM GOALS: Target date: 12/18/2023  Patient to be independent with initial HEP. Baseline: HEP initiated Goal status: INITIAL    LONG TERM GOALS: Target date: 01/01/2024  Patient to be independent with advanced HEP. Baseline: Not yet initiated  Goal status: INITIAL  Patient to report 0/10 dizziness with standing vertical and horizontal VOR for 30 seconds. Baseline: Unable Goal status: INITIAL  Patient will report 0/10 dizziness with bed mobility.  Baseline: Symptomatic  Goal status: INITIAL  Patient to demonstrate mild-moderate sway with M-CTSIB condition with eyes closed/foam surface in order to improve safety in environments with uneven surfaces and dim lighting. Baseline: NT Goal status: INITIAL  Patient to score at least 20/24 on DGI in order to decrease risk of falls. Baseline: NT Goal status: INITIAL  Patient to score <16 on DHI in order to indicate improved functional outcomes.  Baseline: 34 Goal status: INITIAL    ASSESSMENT:  CLINICAL IMPRESSION:    Patient is a 79 y/o M presenting to OPPT with c/o dizziness for the past couple of months. Reports experiencing a "stomach virus"  around the time symptoms first began. Reports occasional blurred vision in the AM or when first driving. Denies head trauma, double vision, hearing loss, tinnitus, migraines. Patient today presenting with Gait deviations, dizziness with VOR, possible + L HIT, motion sensitivity sitting up from R side. Patient was educated on gentle VOR HEP and reported understanding. Would benefit from skilled PT services 1x/week for 6 weeks to address aforementioned impairments in order to optimize level of function.    OBJECTIVE IMPAIRMENTS: Abnormal gait, decreased activity tolerance, decreased balance, and dizziness.   ACTIVITY LIMITATIONS: carrying, lifting, bending, sitting, standing, squatting, sleeping, stairs, transfers, bed mobility, bathing, toileting, dressing, hygiene/grooming, and locomotion level  PARTICIPATION LIMITATIONS: meal prep, cleaning, laundry, driving, shopping, community activity, occupation, yard work, and church  PERSONAL FACTORS: Age, Fitness, Past/current experiences, Time since onset of injury/illness/exacerbation, and 3+ comorbidities: DM, gout, HLT, HTN, CABG 2009  are also affecting patient's functional outcome.   REHAB POTENTIAL: Good  CLINICAL DECISION MAKING: Evolving/moderate complexity  EVALUATION COMPLEXITY: Moderate   PLAN:  PT FREQUENCY: 1x/week  PT DURATION: 6 weeks  PLANNED INTERVENTIONS: 97164- PT Re-evaluation, 97110-Therapeutic exercises, 97530- Therapeutic activity, 97112- Neuromuscular re-education, 97535- Self Care, 64403- Manual therapy, [id number]- Gait training, [phone]- Canalith repositioning, 97014- Electrical stimulation (unattended), [phone]- Electrical stimulation (manual), Patient/Family education, Balance training, Stair training, Taping, Dry Needling, Vestibular training, Cryotherapy, and Moist heat  PLAN FOR NEXT SESSION: MCTSIB, DGI, review and progress HEP   Baldemar Friday, PT, DPT 12/04/23 2:54 PM  [facility] at  [facility] [address] [place], [place] [address] Phone # [phone] Fax # [phone]

## 2023-11-30 ENCOUNTER — Other Ambulatory Visit: Payer: Self-pay | Admitting: Family Medicine

## 2023-11-30 ENCOUNTER — Encounter: Payer: Self-pay | Admitting: Family Medicine

## 2023-11-30 DIAGNOSIS — K760 Fatty (change of) liver, not elsewhere classified: Secondary | ICD-10-CM | POA: Insufficient documentation

## 2023-11-30 DIAGNOSIS — K8020 Calculus of gallbladder without cholecystitis without obstruction: Secondary | ICD-10-CM | POA: Insufficient documentation

## 2023-11-30 DIAGNOSIS — R748 Abnormal levels of other serum enzymes: Secondary | ICD-10-CM

## 2023-11-30 HISTORY — DX: Fatty (change of) liver, not elsewhere classified: K76.0

## 2023-12-03 ENCOUNTER — Other Ambulatory Visit: Payer: Self-pay

## 2023-12-03 DIAGNOSIS — K8020 Calculus of gallbladder without cholecystitis without obstruction: Secondary | ICD-10-CM

## 2023-12-04 ENCOUNTER — Ambulatory Visit: Payer: Medicare (Managed Care) | Attending: Family Medicine | Admitting: Physical Therapy

## 2023-12-04 ENCOUNTER — Other Ambulatory Visit: Payer: Self-pay

## 2023-12-04 ENCOUNTER — Encounter: Payer: Self-pay | Admitting: Physical Therapy

## 2023-12-04 DIAGNOSIS — R42 Dizziness and giddiness: Secondary | ICD-10-CM | POA: Insufficient documentation

## 2023-12-04 DIAGNOSIS — R2689 Other abnormalities of gait and mobility: Secondary | ICD-10-CM | POA: Diagnosis not present

## 2023-12-04 DIAGNOSIS — R2681 Unsteadiness on feet: Secondary | ICD-10-CM | POA: Diagnosis not present

## 2023-12-10 NOTE — Therapy (Signed)
 OUTPATIENT PHYSICAL THERAPY VESTIBULAR TREATMENT     Patient Name: [name] MRN: [id number] DOB:Nov 14, 1945, 78 y.o., male Today's Date: 12/11/2023  END OF SESSION:  PT End of Session - 12/11/23 1442     Visit Number 2    Number of Visits 7    Date for PT Re-Evaluation 01/15/24    Authorization Type [employer]    PT Start Time 1401    PT Stop Time 1445    PT Time Calculation (min) 44 min    Equipment Utilized During Treatment --    Activity Tolerance Patient tolerated treatment well    Behavior During Therapy WFL for tasks assessed/performed              Past Medical History:  Diagnosis Date   CAD, ARTERY BYPASS GRAFT 02/10/2008   CAROTID ARTERY STENOSIS, WITHOUT INFARCTION 09/22/2008   CORONARY ARTERY DISEASE 04/03/2008   Diabetes mellitus    borderline   ERECTILE DYSFUNCTION 01/14/2009   GOUT 01/16/2008   HERPES ZOSTER 04/15/2010   HYPERLIPIDEMIA 01/16/2008   HYPERTENSION 01/16/2008   IMPAIRED GLUCOSE TOLERANCE 08/22/2010   Shingles 2011   left chest   Ulcer 30 yrs ago   stomach   Past Surgical History:  Procedure Laterality Date   COLONOSCOPY     3 months ago   CORONARY ARTERY BYPASS GRAFT  2009   vessels x3   EAR CYST EXCISION N/A 07/29/2013   Procedure: excision of urachal cyst flexible cystoscopy insertion of foley cath;  Surgeon: Donnice Gwenyth Brooks, MD;  Location: [facility];  Service: Urology;  Laterality: N/A;   LAPAROSCOPY N/A 07/29/2013   Procedure: LAPAROSCOPY DIAGNOSTIC  laparoscopic excision of urachal cyst ;  Surgeon: Redell Faith, DO;  Location: [facility];  Service: General;  Laterality: N/A;   LAPAROTOMY  08/25/2012   Procedure: EXPLORATORY LAPAROTOMY;  Surgeon: Redell Faith, DO;  Location: [facility];  Service: General;  Laterality: N/A;  incision and drainage abdominal wall abcessand intra abdominal wall abcess   PARTIAL GASTRECTOMY  1974   bleeding ulcers   urachal  2014   Urachal cyst removal   Patient Active Problem List   Diagnosis  Date Noted   Fatty liver 11/30/2023   Calculus of gallbladder without cholecystitis without obstruction 11/30/2023   Aortic stenosis 10/23/2023   MGUS (monoclonal gammopathy of unknown significance) 10/21/2023   Secondary hyperparathyroidism (HCC) 10/21/2023   Intracranial carotid stenosis, bilateral 10/26/2021   Acute renal failure superimposed on stage 3a chronic kidney disease (HCC) 10/23/2021   Aphasia 10/23/2021   Hyponatremia 10/23/2021   Hyperkalemia 10/23/2021   TIA (transient ischemic attack) 10/23/2021   Metabolic acidosis 10/23/2021   Elevated blood-pressure reading, without diagnosis of hypertension 04/28/2021   Body mass index (BMI) 40.0-44.9, adult (HCC) 04/28/2021   Lumbar spondylosis 04/28/2021   ETOH abuse 08/10/2020   Nocturia 08/10/2020   Heart burn 08/10/2020   Left hip pain 06/06/2018   DM (diabetes mellitus) type II controlled with renal manifestation (HCC) 12/01/2016   Medicare annual wellness visit, subsequent 12/01/2016   BPH (benign prostatic hyperplasia) 01/28/2015   Type 2 diabetes mellitus with peripheral vascular disease (HCC) 06/20/2013   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 07/26/2012   Herpes zoster 04/15/2010   ERECTILE DYSFUNCTION 01/14/2009   Occlusion and stenosis of carotid artery 09/22/2008   Coronary atherosclerosis 04/03/2008   CAD, ARTERY BYPASS GRAFT 02/10/2008   Dyslipidemia 01/16/2008   GOUT 01/16/2008   Essential hypertension 01/16/2008    PCP: Mercer Clotilda SAUNDERS, MD  REFERRING PROVIDER: Mercer Clotilda SAUNDERS, MD   REFERRING DIAG: R42 (ICD-10-CM) - Dizziness  THERAPY DIAG:  Dizziness and giddiness  Unsteadiness on feet  Other abnormalities of gait and mobility  ONSET DATE: couple months ago  Rationale for Evaluation and Treatment: Rehabilitation  SUBJECTIVE:   SUBJECTIVE STATEMENT: Doing very good. The dizziness is remarkably better.  Reports benefit from his HEP. Still having some dizziness with getting up, bending,  and looking up.    Pt accompanied by: self  PERTINENT HISTORY: DM, gout, HLT, HTN, CABG 2009  PAIN:  Are you having pain? No  PRECAUTIONS: Fall  RED FLAGS: None   WEIGHT BEARING RESTRICTIONS: No  FALLS: Has patient fallen in last 6 months? No  LIVING ENVIRONMENT: Lives with: lives with their spouse Lives in: House/apartment Stairs:  1 step to enter; 1 story home Has following equipment at home: [employer] - 2 wheeled and Wheelchair (manual)  PLOF: Independent- works part time at [employer]- standing, bending  PATIENT GOALS: improve dizziness   OBJECTIVE:       TODAY'S TREATMENT: 12/11/23   M-CTSIB  Condition 1: Firm Surface, EO 30 Sec, Normal Sway  Condition 2: Firm Surface, EC 30 Sec, Mild Sway  Condition 3: Foam Surface, EO 30 Sec, Mild Sway  Condition 4: Foam Surface, EC 20 Sec, Moderate Sway     Activity Comments  DGI 16/24  review of HEP sitting VOR horizontal and vertical 2x30 each   Required correction- pt was performing smooth pursuits. Reported mild dizziness after correction. Retinal slip to R rotation   Sitting anterior canal habituation EO and EC 2x each C/o very mild brief sx   Standing bending to pick up cone on chair, then reaching to stack into therapy pole Performed at slow and quick pace without dizziness             [facility] PT Assessment - 12/11/23 0001       Standardized Balance Assessment   Standardized Balance Assessment Dynamic Gait Index      Dynamic Gait Index   Level Surface Mild Impairment    Change in Gait Speed Mild Impairment    Gait with Horizontal Head Turns Normal    Gait with Vertical Head Turns Mild Impairment    Gait and Pivot Turn Mild Impairment    Step Over Obstacle Mild Impairment    Step Around Obstacles Mild Impairment    Steps Moderate Impairment    Total Score 16              HOME EXERCISE PROGRAM Last updated: 12/11/23 Access Code: [email or web address] URL: [email or web address] Date:  12/11/2023 Prepared by: [facility] - Outpatient  Rehab - [facility]  Exercises - Seated Gaze Stabilization with Head Rotation  - 1 x daily - 5 x weekly - 2-3 sets - 30 sec hold - Seated Gaze Stabilization with Head Nod  - 1 x daily - 5 x weekly - 2-3 sets - 30 sec hold - Romberg Stance with Eyes Closed  - 1 x daily - 5 x weekly - 2 sets - 30 sec hold   PATIENT EDUCATION: Education details: edu on balance test findings and balance priorities in therapy, edu on VOR and its importance in daily activities, brain's ability to compensate for vestibular hypofunction, HEP update with edu for safety  Person educated: Patient Education method: Explanation, Demonstration, Tactile cues, Verbal cues, and Handouts Education comprehension: verbalized understanding and returned demonstration        Note: Objective measures  were completed at Evaluation unless otherwise noted.  DIAGNOSTIC FINDINGS: none recent  COGNITION: Overall cognitive status: Within functional limits for tasks assessed   SENSATION: Pt denies N/T in UEs/LEs   POSTURE:  rounded shoulders and forward head  GAIT: Gait pattern: Wide BOS with lateral trunk lean, reduced step length, slowed Assistive device utilized: None Level of assistance: Modified independence  PATIENT SURVEYS:  DHI: 34   VESTIBULAR ASSESSMENT:  GENERAL OBSERVATION: pt wears readers   OCULOMOTOR EXAM:  Ocular Alignment: normal  Ocular ROM: No Limitations  Spontaneous Nystagmus: absent  Gaze-Induced Nystagmus: absent  Smooth Pursuits: intact  Saccades: intact  VESTIBULAR - OCULAR REFLEX:   Slow VOR: Comment: c/o wooziness horizontal and avoids R side; c/o wooziness vertical   VOR Cancellation: Normal  Head-Impulse Test: HIT Right: negative HIT Left: possible positive      POSITIONAL TESTING:  Right Roll Test: negative Left Roll Test: negative  Right Sidelying: negative; c/o mild wooziness upon sitting up Left Sidelying:  negative                                                                                                                              TREATMENT DATE: 12/04/23   PATIENT EDUCATION: Education details: prognosis, POC, HEP Person educated: Patient Education method: Explanation, Demonstration, Tactile cues, Verbal cues, and Handouts Education comprehension: verbalized understanding and returned demonstration  HOME EXERCISE PROGRAM: Access Code: [email or web address] URL: [email or web address] Date: 12/04/2023 Prepared by: [facility] - Outpatient  Rehab - [facility]  Exercises - Seated Gaze Stabilization with Head Rotation  - 1 x daily - 5 x weekly - 2-3 sets - 30 sec hold - Seated Gaze Stabilization with Head Nod  - 1 x daily - 5 x weekly - 2-3 sets - 30 sec hold   GOALS: Goals reviewed with patient? Yes  SHORT TERM GOALS: Target date: 12/18/2023  Patient to be independent with initial HEP. Baseline: HEP initiated Goal status: IN PROGRESS    LONG TERM GOALS: Target date: 01/01/2024  Patient to be independent with advanced HEP. Baseline: Not yet initiated  Goal status: IN PROGRESS  Patient to report 0/10 dizziness with standing vertical and horizontal VOR for 30 seconds. Baseline: Unable Goal status: IN PROGRESS  Patient will report 0/10 dizziness with bed mobility.  Baseline: Symptomatic  Goal status: IN PROGRESS  Patient to demonstrate mild-moderate sway with M-CTSIB condition with eyes closed/foam surface in order to improve safety in environments with uneven surfaces and dim lighting. Baseline: NT Goal status: IN PROGRESS  Patient to score at least 20/24 on DGI in order to decrease risk of falls. Baseline: 16/24 12/11/23 Goal status: IN PROGRESS 12/11/23  Patient to score <16 on DHI in order to indicate improved functional outcomes.  Baseline: 34 Goal status: IN PROGRESS    ASSESSMENT:  CLINICAL IMPRESSION:  Patient arrived to session with report of  improvement in dizziness since last session. Reports some remaining dizziness with getting up, bending,  and looking up. Multisensory balance testing revealed decreased use of vestibular system for balance. Patient scored 16/24 on DGI, indicating increased ris of falls. Reviewed HEP which required correction- performed with improved form and tolerable level of symptoms after review. No complaints at end of session.   OBJECTIVE IMPAIRMENTS: Abnormal gait, decreased activity tolerance, decreased balance, and dizziness.   ACTIVITY LIMITATIONS: carrying, lifting, bending, sitting, standing, squatting, sleeping, stairs, transfers, bed mobility, bathing, toileting, dressing, hygiene/grooming, and locomotion level  PARTICIPATION LIMITATIONS: meal prep, cleaning, laundry, driving, shopping, community activity, occupation, yard work, and church  PERSONAL FACTORS: Age, Fitness, Past/current experiences, Time since onset of injury/illness/exacerbation, and 3+ comorbidities: DM, gout, HLT, HTN, CABG 2009  are also affecting patient's functional outcome.   REHAB POTENTIAL: Good  CLINICAL DECISION MAKING: Evolving/moderate complexity  EVALUATION COMPLEXITY: Moderate   PLAN:  PT FREQUENCY: 1x/week  PT DURATION: 6 weeks  PLANNED INTERVENTIONS: 97164- PT Re-evaluation, 97110-Therapeutic exercises, 97530- Therapeutic activity, [id number]- Neuromuscular re-education, 97535- Self Care, 02859- Manual therapy, [id number]- Gait training, [phone]- Canalith repositioning, 97014- Electrical stimulation (unattended), [phone]- Electrical stimulation (manual), Patient/Family education, Balance training, Stair training, Taping, Dry Needling, Vestibular training, Cryotherapy, and Moist heat  PLAN FOR NEXT SESSION:  review and progress HEP with VOR, balance, habituation    Louana Terrilyn Christians, [place], DPT 12/11/23 4:27 PM  [facility] at [facility] [address], [address] [place], [place]  [address] Phone # [phone] Fax # [phone]

## 2023-12-11 ENCOUNTER — Ambulatory Visit: Payer: Medicare (Managed Care) | Attending: Family Medicine | Admitting: Physical Therapy

## 2023-12-11 ENCOUNTER — Encounter: Payer: Self-pay | Admitting: Physical Therapy

## 2023-12-11 DIAGNOSIS — R2689 Other abnormalities of gait and mobility: Secondary | ICD-10-CM | POA: Diagnosis present

## 2023-12-11 DIAGNOSIS — R42 Dizziness and giddiness: Secondary | ICD-10-CM | POA: Diagnosis present

## 2023-12-11 DIAGNOSIS — R2681 Unsteadiness on feet: Secondary | ICD-10-CM | POA: Insufficient documentation

## 2023-12-13 LAB — LAB REPORT - SCANNED
Albumin, Urine POC: 93.7
Creatinine, POC: 92.4 mg/dL
EGFR: 42
Microalb Creat Ratio: 101

## 2023-12-17 NOTE — Therapy (Signed)
OUTPATIENT PHYSICAL THERAPY VESTIBULAR TREATMENT     Patient Name: [name] MRN: [id number] DOB:17-Mar-1946, 78 y.o., male Today's Date: 12/18/2023  END OF SESSION:  PT End of Session - 12/18/23 1440     Visit Number 3    Number of Visits 7    Date for PT Re-Evaluation 01/15/24    Authorization Type [employer]    PT Start Time 1401    PT Stop Time 1442    PT Time Calculation (min) 41 min    Activity Tolerance Patient tolerated treatment well    Behavior During Therapy [facility] for tasks assessed/performed               Past Medical History:  Diagnosis Date   CAD, ARTERY BYPASS GRAFT 02/10/2008   CAROTID ARTERY STENOSIS, WITHOUT INFARCTION 09/22/2008   CORONARY ARTERY DISEASE 04/03/2008   Diabetes mellitus    "borderline"   ERECTILE DYSFUNCTION 01/14/2009   GOUT 01/16/2008   HERPES ZOSTER 04/15/2010   HYPERLIPIDEMIA 01/16/2008   HYPERTENSION 01/16/2008   IMPAIRED GLUCOSE TOLERANCE 08/22/2010   Shingles 2011   left chest   Ulcer 30 yrs ago   stomach   Past Surgical History:  Procedure Laterality Date   COLONOSCOPY     3 months ago   CORONARY ARTERY BYPASS GRAFT  2009   vessels x3   EAR CYST EXCISION N/A 07/29/2013   Procedure: excision of urachal cyst flexible cystoscopy insertion of foley cath;  Surgeon: Antony Haste, MD;  Location: [facility];  Service: Urology;  Laterality: N/A;   LAPAROSCOPY N/A 07/29/2013   Procedure: LAPAROSCOPY DIAGNOSTIC  laparoscopic excision of urachal cyst ;  Surgeon: Lodema Pilot, DO;  Location: [facility];  Service: General;  Laterality: N/A;   LAPAROTOMY  08/25/2012   Procedure: EXPLORATORY LAPAROTOMY;  Surgeon: Lodema Pilot, DO;  Location: [facility];  Service: General;  Laterality: N/A;  incision and drainage abdominal wall abcessand intra abdominal wall abcess   PARTIAL GASTRECTOMY  1974   bleeding ulcers   urachal  2014   Urachal cyst removal   Patient Active Problem List   Diagnosis Date Noted   Fatty liver 11/30/2023    Calculus of gallbladder without cholecystitis without obstruction 11/30/2023   Aortic stenosis 10/23/2023   MGUS (monoclonal gammopathy of unknown significance) 10/21/2023   Secondary hyperparathyroidism (HCC) 10/21/2023   Intracranial carotid stenosis, bilateral 10/26/2021   Acute renal failure superimposed on stage 3a chronic kidney disease (HCC) 10/23/2021   Aphasia 10/23/2021   Hyponatremia 10/23/2021   Hyperkalemia 10/23/2021   TIA (transient ischemic attack) 10/23/2021   Metabolic acidosis 10/23/2021   Elevated blood-pressure reading, without diagnosis of hypertension 04/28/2021   Body mass index (BMI) 40.0-44.9, adult (HCC) 04/28/2021   Lumbar spondylosis 04/28/2021   ETOH abuse 08/10/2020   Nocturia 08/10/2020   Heart burn 08/10/2020   Left hip pain 06/06/2018   DM (diabetes mellitus) type II controlled with renal manifestation (HCC) 12/01/2016   Medicare annual wellness visit, subsequent 12/01/2016   BPH (benign prostatic hyperplasia) 01/28/2015   Type 2 diabetes mellitus with peripheral vascular disease (HCC) 06/20/2013   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 07/26/2012   Herpes zoster 04/15/2010   ERECTILE DYSFUNCTION 01/14/2009   Occlusion and stenosis of carotid artery 09/22/2008   Coronary atherosclerosis 04/03/2008   CAD, ARTERY BYPASS GRAFT 02/10/2008   Dyslipidemia 01/16/2008   GOUT 01/16/2008   Essential hypertension 01/16/2008    PCP: Deeann Saint, MD  REFERRING PROVIDER: Deeann Saint, MD  REFERRING DIAG: R42 (ICD-10-CM) - Dizziness  THERAPY DIAG:  Dizziness and giddiness  Unsteadiness on feet  Other abnormalities of gait and mobility  ONSET DATE: couple months ago  Rationale for Evaluation and Treatment: Rehabilitation  SUBJECTIVE:   SUBJECTIVE STATEMENT: Think I have some gout in my R wrist. Have some medicine to take for it. Reports that standing with eyes closed on one foot it's hard, however "I'm good" with the other exercises.  Reports "none" dizziness.   Pt accompanied by: self  PERTINENT HISTORY: DM, gout, HLT, HTN, CABG 2009  PAIN:  Are you having pain? Yes: NPRS scale: 9/10 Pain location: R wrist Pain description: sore Aggravating factors: pressure Relieving factors: meds  PRECAUTIONS: Fall  RED FLAGS: None   WEIGHT BEARING RESTRICTIONS: No  FALLS: Has patient fallen in last 6 months? No  LIVING ENVIRONMENT: Lives with: lives with their spouse Lives in: House/apartment Stairs:  1 step to enter; 1 story home Has following equipment at home: Environmental consultant - 2 wheeled and Wheelchair (manual)  PLOF: Independent- works part time at [employer]- standing, bending  PATIENT GOALS: improve dizziness   OBJECTIVE:      TODAY'S TREATMENT: 12/18/23 Activity Comments  R ulnar aspect of wrist slightly warm and edematous    review of HEP: sitting horizontal VOR 30" sitting vertical VOR 30" romberg EC 30" Reports "just a second or 2" of mild dizziness upon stopping VOR  alt toe tap on 6" step 3x10 Weaned to no UE support with good stability      fwd/back stepping  1 UE support; better stability with R LE stabilizing; cues to wised BOS and look ahead   fwd/back stepping +head nods/turns 1 UE support; cues for shorter steps d/t some instability; occasional cueing for sequencing   step ups 6" 10x each LE CGA; 1 UE support   Toe tap on 1st, 2nd step, down 10x each  Weaned to few fingertips for support on handrail; CGA for safety        HOME EXERCISE PROGRAM Last updated: 12/18/23 Access Code: [email or web address] URL: [email or web address] Date: 12/18/2023 Prepared by: [facility] - Outpatient  Rehab - [facility]  Exercises - Seated Gaze Stabilization with Head Rotation  - 1 x daily - 5 x weekly - 2-3 sets - 30 sec hold - Seated Gaze Stabilization with Head Nod  - 1 x daily - 5 x weekly - 2-3 sets - 30 sec hold - Romberg Stance with Eyes Closed  - 1 x daily - 5 x weekly - 2 sets - 30 sec  hold - Standing Toe Taps  - 1 x daily - 5 x weekly - 2 sets - 10 reps    PATIENT EDUCATION: Education details: clarified HEP- patient not supposed to be performing SLS with EC; HEP update with edu for safety Person educated: Patient Education method: Explanation, Demonstration, Tactile cues, Verbal cues, and Handouts Education comprehension: verbalized understanding and returned demonstration         Note: Objective measures were completed at Evaluation unless otherwise noted.  DIAGNOSTIC FINDINGS: none recent  COGNITION: Overall cognitive status: Within functional limits for tasks assessed   SENSATION: Pt denies N/T in UEs/LEs   POSTURE:  rounded shoulders and forward head  GAIT: Gait pattern: Wide BOS with lateral trunk lean, reduced step length, slowed Assistive device utilized: None Level of assistance: Modified independence  PATIENT SURVEYS:  DHI: 34   VESTIBULAR ASSESSMENT:  GENERAL OBSERVATION: pt wears readers   OCULOMOTOR EXAM:  Ocular Alignment: normal  Ocular ROM: No Limitations  Spontaneous Nystagmus: absent  Gaze-Induced Nystagmus: absent  Smooth Pursuits: intact  Saccades: intact  VESTIBULAR - OCULAR REFLEX:   Slow VOR: Comment: c/o wooziness horizontal and avoids R side; c/o wooziness vertical   VOR Cancellation: Normal  Head-Impulse Test: HIT Right: negative HIT Left: possible positive      POSITIONAL TESTING:  Right Roll Test: negative Left Roll Test: negative  Right Sidelying: negative; c/o mild wooziness upon sitting up Left Sidelying: negative                                                                                                                              TREATMENT DATE: 12/04/23   PATIENT EDUCATION: Education details: prognosis, POC, HEP Person educated: Patient Education method: Explanation, Demonstration, Tactile cues, Verbal cues, and Handouts Education comprehension: verbalized understanding and returned  demonstration  HOME EXERCISE PROGRAM: Access Code: [email or web address] URL: [email or web address] Date: 12/04/2023 Prepared by: [facility] - Outpatient  Rehab - [facility]  Exercises - Seated Gaze Stabilization with Head Rotation  - 1 x daily - 5 x weekly - 2-3 sets - 30 sec hold - Seated Gaze Stabilization with Head Nod  - 1 x daily - 5 x weekly - 2-3 sets - 30 sec hold   GOALS: Goals reviewed with patient? Yes  SHORT TERM GOALS: Target date: 12/18/2023  Patient to be independent with initial HEP. Baseline: HEP initiated Goal status: MET 12/18/23    LONG TERM GOALS: Target date: 01/01/2024  Patient to be independent with advanced HEP. Baseline: Not yet initiated  Goal status: IN PROGRESS  Patient to report 0/10 dizziness with standing vertical and horizontal VOR for 30 seconds. Baseline: Unable Goal status: IN PROGRESS  Patient will report 0/10 dizziness with bed mobility.  Baseline: Symptomatic  Goal status: IN PROGRESS  Patient to demonstrate mild-moderate sway with M-CTSIB condition with eyes closed/foam surface in order to improve safety in environments with uneven surfaces and dim lighting. Baseline: NT Goal status: IN PROGRESS  Patient to score at least 20/24 on DGI in order to decrease risk of falls. Baseline: 16/24 12/11/23 Goal status: IN PROGRESS 12/11/23  Patient to score <16 on DHI in order to indicate improved functional outcomes.  Baseline: 34 Goal status: IN PROGRESS    ASSESSMENT:  CLINICAL IMPRESSION: Patient arrived to session with report of some R wrist pain- suspects gout. R ulnar aspect of wrist slightly edematous and warm. Reviewed HEP to ensure understanding of HEP- form with VOR much improved today. Balance activities focused on SLS, steps, and stepping strategy. Patient required cueing to widen BOS and required use of 1 UE support with several activities. Patient tolerated session well and without complaints upon leaving.   OBJECTIVE  IMPAIRMENTS: Abnormal gait, decreased activity tolerance, decreased balance, and dizziness.   ACTIVITY LIMITATIONS: carrying, lifting, bending, sitting, standing, squatting, sleeping, stairs, transfers, bed mobility, bathing, toileting, dressing, hygiene/grooming, and locomotion level  PARTICIPATION  LIMITATIONS: meal prep, cleaning, laundry, driving, shopping, community activity, occupation, yard work, and church  PERSONAL FACTORS: Age, Fitness, Past/current experiences, Time since onset of injury/illness/exacerbation, and 3+ comorbidities: DM, gout, HLT, HTN, CABG 2009  are also affecting patient's functional outcome.   REHAB POTENTIAL: Good  CLINICAL DECISION MAKING: Evolving/moderate complexity  EVALUATION COMPLEXITY: Moderate   PLAN:  PT FREQUENCY: 1x/week  PT DURATION: 6 weeks  PLANNED INTERVENTIONS: 97164- PT Re-evaluation, 97110-Therapeutic exercises, 97530- Therapeutic activity, [id number]- Neuromuscular re-education, 97535- Self Care, 91478- Manual therapy, [id number]- Gait training, [phone]- Canalith repositioning, 97014- Electrical stimulation (unattended), [phone]- Electrical stimulation (manual), Patient/Family education, Balance training, Stair training, Taping, Dry Needling, Vestibular training, Cryotherapy, and Moist heat  PLAN FOR NEXT SESSION:  review and progress HEP with VOR, balance, habituation    Baldemar Friday, [name], DPT 12/18/23 2:43 PM  [facility] Health Outpatient Rehab at [facility] [address] [place], [place] [address] Phone # [phone] Fax # [phone]

## 2023-12-18 ENCOUNTER — Encounter: Payer: Self-pay | Admitting: Physical Therapy

## 2023-12-18 ENCOUNTER — Ambulatory Visit: Payer: Medicare (Managed Care) | Admitting: Physical Therapy

## 2023-12-18 DIAGNOSIS — R42 Dizziness and giddiness: Secondary | ICD-10-CM | POA: Diagnosis not present

## 2023-12-18 DIAGNOSIS — R2681 Unsteadiness on feet: Secondary | ICD-10-CM

## 2023-12-18 DIAGNOSIS — R2689 Other abnormalities of gait and mobility: Secondary | ICD-10-CM

## 2023-12-19 ENCOUNTER — Encounter: Payer: Self-pay | Admitting: Nephrology

## 2023-12-25 ENCOUNTER — Ambulatory Visit: Payer: Medicare (Managed Care) | Admitting: Physical Therapy

## 2023-12-25 ENCOUNTER — Encounter: Payer: Self-pay | Admitting: Physical Therapy

## 2023-12-25 DIAGNOSIS — R2681 Unsteadiness on feet: Secondary | ICD-10-CM

## 2023-12-25 DIAGNOSIS — R42 Dizziness and giddiness: Secondary | ICD-10-CM | POA: Diagnosis not present

## 2023-12-25 NOTE — Therapy (Signed)
OUTPATIENT PHYSICAL THERAPY VESTIBULAR TREATMENT     Patient Name: [name] MRN: [id number] DOB:1946/07/12, 78 y.o., male Today's Date: 12/25/2023  END OF SESSION:  PT End of Session - 12/25/23 1411     Visit Number 4    Number of Visits 7    Date for PT Re-Evaluation 01/15/24    Authorization Type [employer]    PT Start Time 1407    PT Stop Time 1445    PT Time Calculation (min) 38 min    Activity Tolerance Patient tolerated treatment well    Behavior During Therapy [facility] for tasks assessed/performed                Past Medical History:  Diagnosis Date   CAD, ARTERY BYPASS GRAFT 02/10/2008   CAROTID ARTERY STENOSIS, WITHOUT INFARCTION 09/22/2008   CORONARY ARTERY DISEASE 04/03/2008   Diabetes mellitus    "borderline"   ERECTILE DYSFUNCTION 01/14/2009   GOUT 01/16/2008   HERPES ZOSTER 04/15/2010   HYPERLIPIDEMIA 01/16/2008   HYPERTENSION 01/16/2008   IMPAIRED GLUCOSE TOLERANCE 08/22/2010   Shingles 2011   left chest   Ulcer 30 yrs ago   stomach   Past Surgical History:  Procedure Laterality Date   COLONOSCOPY     3 months ago   CORONARY ARTERY BYPASS GRAFT  2009   vessels x3   EAR CYST EXCISION N/A 07/29/2013   Procedure: excision of urachal cyst flexible cystoscopy insertion of foley cath;  Surgeon: Antony Haste, MD;  Location: [facility];  Service: Urology;  Laterality: N/A;   LAPAROSCOPY N/A 07/29/2013   Procedure: LAPAROSCOPY DIAGNOSTIC  laparoscopic excision of urachal cyst ;  Surgeon: Lodema Pilot, DO;  Location: [facility];  Service: General;  Laterality: N/A;   LAPAROTOMY  08/25/2012   Procedure: EXPLORATORY LAPAROTOMY;  Surgeon: Lodema Pilot, DO;  Location: [facility];  Service: General;  Laterality: N/A;  incision and drainage abdominal wall abcessand intra abdominal wall abcess   PARTIAL GASTRECTOMY  1974   bleeding ulcers   urachal  2014   Urachal cyst removal   Patient Active Problem List   Diagnosis Date Noted   Fatty liver 11/30/2023    Calculus of gallbladder without cholecystitis without obstruction 11/30/2023   Aortic stenosis 10/23/2023   MGUS (monoclonal gammopathy of unknown significance) 10/21/2023   Secondary hyperparathyroidism (HCC) 10/21/2023   Intracranial carotid stenosis, bilateral 10/26/2021   Acute renal failure superimposed on stage 3a chronic kidney disease (HCC) 10/23/2021   Aphasia 10/23/2021   Hyponatremia 10/23/2021   Hyperkalemia 10/23/2021   TIA (transient ischemic attack) 10/23/2021   Metabolic acidosis 10/23/2021   Elevated blood-pressure reading, without diagnosis of hypertension 04/28/2021   Body mass index (BMI) 40.0-44.9, adult (HCC) 04/28/2021   Lumbar spondylosis 04/28/2021   ETOH abuse 08/10/2020   Nocturia 08/10/2020   Heart burn 08/10/2020   Left hip pain 06/06/2018   DM (diabetes mellitus) type II controlled with renal manifestation (HCC) 12/01/2016   Medicare annual wellness visit, subsequent 12/01/2016   BPH (benign prostatic hyperplasia) 01/28/2015   Type 2 diabetes mellitus with peripheral vascular disease (HCC) 06/20/2013   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 07/26/2012   Herpes zoster 04/15/2010   ERECTILE DYSFUNCTION 01/14/2009   Occlusion and stenosis of carotid artery 09/22/2008   Coronary atherosclerosis 04/03/2008   CAD, ARTERY BYPASS GRAFT 02/10/2008   Dyslipidemia 01/16/2008   GOUT 01/16/2008   Essential hypertension 01/16/2008    PCP: Deeann Saint, MD  REFERRING PROVIDER: Deeann Saint, MD  REFERRING DIAG: R42 (ICD-10-CM) - Dizziness  THERAPY DIAG:  Dizziness and giddiness  Unsteadiness on feet  ONSET DATE: couple months ago  Rationale for Evaluation and Treatment: Rehabilitation  SUBJECTIVE:   SUBJECTIVE STATEMENT: Feel like I'm getting better.  Still get a little off-balance, but I can tell it's getting better.    Pt accompanied by: self  PERTINENT HISTORY: DM, gout, HLT, HTN, CABG 2009  PAIN:  Are you having pain? Yes: NPRS  scale: 3/10 Pain location: R wrist Pain description: sore Aggravating factors: pressure Relieving factors: meds  PRECAUTIONS: Fall  RED FLAGS: None   WEIGHT BEARING RESTRICTIONS: No  FALLS: Has patient fallen in last 6 months? No  LIVING ENVIRONMENT: Lives with: lives with their spouse Lives in: House/apartment Stairs:  1 step to enter; 1 story home Has following equipment at home: Environmental consultant - 2 wheeled and Wheelchair (manual)  PLOF: Independent- works part time at [employer]- standing, bending  PATIENT GOALS: improve dizziness   OBJECTIVE:   Reports bending down to get pots/pans and then returning to standing sometimes can bring on dizziness, reaching up to turn on ceiling fan  TODAY'S TREATMENT: 12/25/2023 Activity Comments  Standing VOR horizontal  30 sec Vertical VOR 30 seconds  1/10 dizziness   Romberg EC 30 sec   Feet apart EC head turns/nods 30 sec each BUE support  Nose to L knee>midline x 5  No dizziness  Nose to R knee>midline x 5 No dizziness  Standing squats to L x 3, then R x 3; then performed with 2.2# weighted ball x 5 reps each side, then knee to opposite shoulder diagonal lifts x 5 reps each side 1-2/10 dizziness  1-2/10 dizziness  Fwd/back stepping with 1 UE support and other side reaching up and back Difficulty sequencing fwd-back step with RLE as stance today  Gait with head turns Gait with head nods Fast/slow gait Gait with quick 180 turn and stop 3/10 dizziness <3/10      HOME EXERCISE PROGRAM Access Code: [email or web address] URL: [email or web address] Date: 12/25/2023 Prepared by: [facility] - Outpatient  Rehab - [facility]  Exercises - Romberg Stance with Eyes Closed  - 1 x daily - 5 x weekly - 2 sets - 30 sec hold - Standing Toe Taps  - 1 x daily - 5 x weekly - 2 sets - 10 reps - Standing Gaze Stabilization with Head Rotation  - 1-2 x daily - 7 x weekly - 3 sets - 30 sec hold - Standing Gaze Stabilization with Head Nod  - 1-2  x daily - 7 x weekly - 1 sets - 3 reps - 30 sec hold     PATIENT EDUCATION: Education details: updates to HEP for standing VOR Person educated: Patient Education method: Explanation, Demonstration, Tactile cues, Verbal cues, and Handouts Education comprehension: verbalized understanding and returned demonstration         Note: Objective measures were completed at Evaluation unless otherwise noted.  DIAGNOSTIC FINDINGS: none recent  COGNITION: Overall cognitive status: Within functional limits for tasks assessed   SENSATION: Pt denies N/T in UEs/LEs   POSTURE:  rounded shoulders and forward head  GAIT: Gait pattern: Wide BOS with lateral trunk lean, reduced step length, slowed Assistive device utilized: None Level of assistance: Modified independence  PATIENT SURVEYS:  DHI: 34   VESTIBULAR ASSESSMENT:  GENERAL OBSERVATION: pt wears readers   OCULOMOTOR EXAM:  Ocular Alignment: normal  Ocular ROM: No Limitations  Spontaneous Nystagmus: absent  Gaze-Induced Nystagmus: absent  Smooth Pursuits: intact  Saccades: intact  VESTIBULAR - OCULAR REFLEX:   Slow VOR: Comment: c/o wooziness horizontal and avoids R side; c/o wooziness vertical   VOR Cancellation: Normal  Head-Impulse Test: HIT Right: negative HIT Left: possible positive      POSITIONAL TESTING:  Right Roll Test: negative Left Roll Test: negative  Right Sidelying: negative; c/o mild wooziness upon sitting up Left Sidelying: negative                                                                                                                              TREATMENT DATE: 12/04/23   PATIENT EDUCATION: Education details: prognosis, POC, HEP Person educated: Patient Education method: Explanation, Demonstration, Tactile cues, Verbal cues, and Handouts Education comprehension: verbalized understanding and returned demonstration  HOME EXERCISE PROGRAM: Access Code: [email or web address] URL:  [email or web address] Date: 12/04/2023 Prepared by: [facility] - Outpatient  Rehab - [facility]  Exercises - Seated Gaze Stabilization with Head Rotation  - 1 x daily - 5 x weekly - 2-3 sets - 30 sec hold - Seated Gaze Stabilization with Head Nod  - 1 x daily - 5 x weekly - 2-3 sets - 30 sec hold   GOALS: Goals reviewed with patient? Yes  SHORT TERM GOALS: Target date: 12/18/2023  Patient to be independent with initial HEP. Baseline: HEP initiated Goal status: MET 12/18/23    LONG TERM GOALS: Target date: 01/01/2024  Patient to be independent with advanced HEP. Baseline: Not yet initiated  Goal status: IN PROGRESS  Patient to report 0/10 dizziness with standing vertical and horizontal VOR for 30 seconds. Baseline: Unable Goal status: IN PROGRESS  Patient will report 0/10 dizziness with bed mobility.  Baseline: Symptomatic  Goal status: IN PROGRESS  Patient to demonstrate mild-moderate sway with M-CTSIB condition with eyes closed/foam surface in order to improve safety in environments with uneven surfaces and dim lighting. Baseline: NT Goal status: IN PROGRESS  Patient to score at least 20/24 on DGI in order to decrease risk of falls. Baseline: 16/24 12/11/23 Goal status: IN PROGRESS 12/11/23  Patient to score <16 on DHI in order to indicate improved functional outcomes.  Baseline: 34 Goal status: IN PROGRESS    ASSESSMENT:  CLINICAL IMPRESSION: Pt presents today with reports of feeling balance is improving.  He reports very mild dizziness symptoms with seated and standing VOR; progressed to standing VOR as part of HEP.  Worked on motions sensitivity exercises based on what pt reports bring on slight dizziness at home.  Most dizziness is brought on today at end of session with gait/head turns/nods, with horizontal head turns bringing on 3/10 dizziness.  He tolerates session well and has no complaints at end of session.   Pt will continue to benefit from  skilled PT towards goals for improved functional mobility and decreased dizziness.  OBJECTIVE IMPAIRMENTS: Abnormal gait, decreased activity tolerance, decreased balance, and dizziness.   ACTIVITY LIMITATIONS: carrying, lifting, bending, sitting, standing, squatting,  sleeping, stairs, transfers, bed mobility, bathing, toileting, dressing, hygiene/grooming, and locomotion level  PARTICIPATION LIMITATIONS: meal prep, cleaning, laundry, driving, shopping, community activity, occupation, yard work, and church  PERSONAL FACTORS: Age, Fitness, Past/current experiences, Time since onset of injury/illness/exacerbation, and 3+ comorbidities: DM, gout, HLT, HTN, CABG 2009  are also affecting patient's functional outcome.   REHAB POTENTIAL: Good  CLINICAL DECISION MAKING: Evolving/moderate complexity  EVALUATION COMPLEXITY: Moderate   PLAN:  PT FREQUENCY: 1x/week  PT DURATION: 6 weeks  PLANNED INTERVENTIONS: 97164- PT Re-evaluation, 97110-Therapeutic exercises, 97530- Therapeutic activity, 97112- Neuromuscular re-education, 97535- Self Care, 95621- Manual therapy, [id number]- Gait training, [phone]- Canalith repositioning, 97014- Electrical stimulation (unattended), [id number]- Electrical stimulation (manual), Patient/Family education, Balance training, Stair training, Taping, Dry Needling, Vestibular training, Cryotherapy, and Moist heat  PLAN FOR NEXT SESSION:  Continue to review and progress HEP with VOR, balance, habituation.  Check LTGs and discuss POC.  Lonia Blood, PT 12/25/23 2:49 PM Phone: [phone] Fax: [phone]  [facility] Health Outpatient Rehab at [facility] [address] [place], [address] [place], [place] [address] Phone # [phone] Fax # [phone]

## 2023-12-28 NOTE — Therapy (Signed)
 OUTPATIENT PHYSICAL THERAPY VESTIBULAR DISCHARGE     Patient Name: [name] MRN: [id number] DOB:Apr 29, 1946, 78 y.o., male Today's Date: 01/01/2024   Progress Note Reporting Period 12/04/23 to 01/01/24  See note below for Objective Data and Assessment of Progress/Goals.     END OF SESSION:  PT End of Session - 01/01/24 1437     Visit Number 5    Number of Visits 7    Date for PT Re-Evaluation 01/15/24    Authorization Type [employer]    PT Start Time 1355    PT Stop Time 1436    PT Time Calculation (min) 41 min    Activity Tolerance Patient tolerated treatment well    Behavior During Therapy WFL for tasks assessed/performed                 Past Medical History:  Diagnosis Date   CAD, ARTERY BYPASS GRAFT 02/10/2008   CAROTID ARTERY STENOSIS, WITHOUT INFARCTION 09/22/2008   CORONARY ARTERY DISEASE 04/03/2008   Diabetes mellitus    "borderline"   ERECTILE DYSFUNCTION 01/14/2009   GOUT 01/16/2008   HERPES ZOSTER 04/15/2010   HYPERLIPIDEMIA 01/16/2008   HYPERTENSION 01/16/2008   IMPAIRED GLUCOSE TOLERANCE 08/22/2010   Shingles 2011   left chest   Ulcer 30 yrs ago   stomach   Past Surgical History:  Procedure Laterality Date   COLONOSCOPY     3 months ago   CORONARY ARTERY BYPASS GRAFT  2009   vessels x3   EAR CYST EXCISION N/A 07/29/2013   Procedure: excision of urachal cyst flexible cystoscopy insertion of foley cath;  Surgeon: Antony Haste, MD;  Location: [facility];  Service: Urology;  Laterality: N/A;   LAPAROSCOPY N/A 07/29/2013   Procedure: LAPAROSCOPY DIAGNOSTIC  laparoscopic excision of urachal cyst ;  Surgeon: Lodema Pilot, DO;  Location: [facility];  Service: General;  Laterality: N/A;   LAPAROTOMY  08/25/2012   Procedure: EXPLORATORY LAPAROTOMY;  Surgeon: Lodema Pilot, DO;  Location: [facility];  Service: General;  Laterality: N/A;  incision and drainage abdominal wall abcessand intra abdominal wall abcess   PARTIAL GASTRECTOMY  1974    bleeding ulcers   urachal  2014   Urachal cyst removal   Patient Active Problem List   Diagnosis Date Noted   Fatty liver 11/30/2023   Calculus of gallbladder without cholecystitis without obstruction 11/30/2023   Aortic stenosis 10/23/2023   MGUS (monoclonal gammopathy of unknown significance) 10/21/2023   Secondary hyperparathyroidism (HCC) 10/21/2023   Intracranial carotid stenosis, bilateral 10/26/2021   Acute renal failure superimposed on stage 3a chronic kidney disease (HCC) 10/23/2021   Aphasia 10/23/2021   Hyponatremia 10/23/2021   Hyperkalemia 10/23/2021   TIA (transient ischemic attack) 10/23/2021   Metabolic acidosis 10/23/2021   Elevated blood-pressure reading, without diagnosis of hypertension 04/28/2021   Body mass index (BMI) 40.0-44.9, adult (HCC) 04/28/2021   Lumbar spondylosis 04/28/2021   ETOH abuse 08/10/2020   Nocturia 08/10/2020   Heart burn 08/10/2020   Left hip pain 06/06/2018   DM (diabetes mellitus) type II controlled with renal manifestation (HCC) 12/01/2016   Medicare annual wellness visit, subsequent 12/01/2016   BPH (benign prostatic hyperplasia) 01/28/2015   Type 2 diabetes mellitus with peripheral vascular disease (HCC) 06/20/2013   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 07/26/2012   Herpes zoster 04/15/2010   ERECTILE DYSFUNCTION 01/14/2009   Occlusion and stenosis of carotid artery 09/22/2008   Coronary atherosclerosis 04/03/2008   CAD, ARTERY BYPASS GRAFT 02/10/2008   Dyslipidemia 01/16/2008  GOUT 01/16/2008   Essential hypertension 01/16/2008    PCP: Deeann Saint, MD  REFERRING PROVIDER: Deeann Saint, MD   REFERRING DIAG: R42 (ICD-10-CM) - Dizziness  THERAPY DIAG:  Dizziness and giddiness  Unsteadiness on feet  Other abnormalities of gait and mobility  ONSET DATE: couple months ago  Rationale for Evaluation and Treatment: Rehabilitation  SUBJECTIVE:   SUBJECTIVE STATEMENT: Reports that his wife was in the hospital  for 2 days. Having to help out a little more at home right now. Reports no dizziness for the past 2.5 weeks. Reports that he is ready to wrap up with therapy and return to work. Denies dizziness when getting into/out of bed or when turning head quickly to drive.     Pt accompanied by: self  PERTINENT HISTORY: DM, gout, HLT, HTN, CABG 2009  PAIN:  Are you having pain? Yes: NPRS scale: 0/10 Pain location: R wrist Pain description: sore Aggravating factors: pressure Relieving factors: meds  PRECAUTIONS: Fall  RED FLAGS: None   WEIGHT BEARING RESTRICTIONS: No  FALLS: Has patient fallen in last 6 months? No  LIVING ENVIRONMENT: Lives with: lives with their spouse Lives in: House/apartment Stairs:  1 step to enter; 1 story home Has following equipment at home: Environmental consultant - 2 wheeled and Wheelchair (manual)  PLOF: Independent- works part time at [employer]- standing, bending  PATIENT GOALS: improve dizziness   OBJECTIVE:      TODAY'S TREATMENT: 01/01/24 Activity Comments  Standing horizontal/vertical VOR 30" 0/10 dizziness horizontal, 1/10 dizziness vertical   MCTSIB #4 Mild sway for 30 sec; feet not fully in romberg   DGI  18/24  DHI 10/100 (10%)  Standing D2 flexion to cone on floor 5x each side Cues for wider BOS to improve ability to reach target when bending      [facility] PT Assessment - 01/01/24 0001       Dynamic Gait Index   Level Surface Mild Impairment    Change in Gait Speed Mild Impairment    Gait with Horizontal Head Turns Normal    Gait with Vertical Head Turns Normal    Gait and Pivot Turn Normal    Step Over Obstacle Moderate Impairment    Step Around Obstacles Mild Impairment    Steps Mild Impairment    Total Score 18             PATIENT EDUCATION: Education details: discussed progress towards goals and remaining impairments ; HEP update; discussed pt's plans to continue fitness- he reports enjoying walking in the neighborhood and planning on  purchasing a treadmill  Person educated: Patient Education method: Explanation, Demonstration, Tactile cues, Verbal cues, and Handouts Education comprehension: verbalized understanding and returned demonstration    HOME EXERCISE PROGRAM Access Code: [email or web address] URL: [email or web address] Date: 01/01/2024 Prepared by: [facility] - Outpatient  Rehab - [facility]  Exercises - Standing Toe Taps  - 1 x daily - 5 x weekly - 2 sets - 10 reps - Standing Gaze Stabilization with Head Nod  - 1-2 x daily - 7 x weekly - 1 sets - 3 reps - 30 sec hold          Note: Objective measures were completed at Evaluation unless otherwise noted.  DIAGNOSTIC FINDINGS: none recent  COGNITION: Overall cognitive status: Within functional limits for tasks assessed   SENSATION: Pt denies N/T in UEs/LEs   POSTURE:  rounded shoulders and forward head  GAIT: Gait pattern: Wide BOS with lateral trunk lean, reduced  step length, slowed Assistive device utilized: None Level of assistance: Modified independence  PATIENT SURVEYS:  DHI: 34   VESTIBULAR ASSESSMENT:  GENERAL OBSERVATION: pt wears readers   OCULOMOTOR EXAM:  Ocular Alignment: normal  Ocular ROM: No Limitations  Spontaneous Nystagmus: absent  Gaze-Induced Nystagmus: absent  Smooth Pursuits: intact  Saccades: intact  VESTIBULAR - OCULAR REFLEX:   Slow VOR: Comment: c/o wooziness horizontal and avoids R side; c/o wooziness vertical   VOR Cancellation: Normal  Head-Impulse Test: HIT Right: negative HIT Left: possible positive      POSITIONAL TESTING:  Right Roll Test: negative Left Roll Test: negative  Right Sidelying: negative; c/o mild wooziness upon sitting up Left Sidelying: negative                                                                                                                              TREATMENT DATE: 12/04/23   PATIENT EDUCATION: Education details: prognosis, POC, HEP Person  educated: Patient Education method: Explanation, Demonstration, Tactile cues, Verbal cues, and Handouts Education comprehension: verbalized understanding and returned demonstration  HOME EXERCISE PROGRAM: Access Code: [email or web address] URL: [email or web address] Date: 12/04/2023 Prepared by: [facility] - Outpatient  Rehab - [facility]  Exercises - Seated Gaze Stabilization with Head Rotation  - 1 x daily - 5 x weekly - 2-3 sets - 30 sec hold - Seated Gaze Stabilization with Head Nod  - 1 x daily - 5 x weekly - 2-3 sets - 30 sec hold   GOALS: Goals reviewed with patient? Yes  SHORT TERM GOALS: Target date: 12/18/2023  Patient to be independent with initial HEP. Baseline: HEP initiated Goal status: MET 12/18/23    LONG TERM GOALS: Target date: 01/01/2024  Patient to be independent with advanced HEP. Baseline: Not yet initiated  Goal status: MET 01/01/24  Patient to report 0/10 dizziness with standing vertical and horizontal VOR for 30 seconds. Baseline: Unable; 0/10 dizziness horizontal, 1/10 dizziness vertical  01/01/24 Goal status: PARTIALLY MET 01/01/24  Patient will report 0/10 dizziness with bed mobility.  Baseline: Symptomatic ; pt reports asymptomatic 01/01/24 Goal status: MET 01/01/24  Patient to demonstrate mild-moderate sway with M-CTSIB condition with eyes closed/foam surface in order to improve safety in environments with uneven surfaces and dim lighting. Baseline: NT; Mild sway for 30 sec; feet not fully in romberg 01/01/24 Goal status: PARTIALLY MET 01/01/24   Patient to score at least 20/24 on DGI in order to decrease risk of falls. Baseline: 16/24 12/11/23; 18/24  01/01/24 Goal status: NOT MET 01/01/24  Patient to score <16 on DHI in order to indicate improved functional outcomes.  Baseline: 34; 10 01/01/24 Goal status: MET 01/01/24    ASSESSMENT:  CLINICAL IMPRESSION: Patient arrived to session with report of no dizziness for the past 2.5 weeks.  Reports that he feels ready to return to work and wrap up with PT.  Denies dizziness when getting into/out of bed or when turning head quickly to  drive. Reports 0-1/10 dizziness with VOR activities. Patient scored 18/24 on DGI, improved from initial assessment but still indicating an increased risk of falls. Most difficulty evident with stepping over obstacle, turns, steps. Multisensory balance testing is also improved. Counseled on continuing consolidated HEP and encouraged continued walking for exercise for vestibular health/conditioning. Patient reported understanding. Patient has made good progress towards goals and is satisfied with his progress.   OBJECTIVE IMPAIRMENTS: Abnormal gait, decreased activity tolerance, decreased balance, and dizziness.   ACTIVITY LIMITATIONS: carrying, lifting, bending, sitting, standing, squatting, sleeping, stairs, transfers, bed mobility, bathing, toileting, dressing, hygiene/grooming, and locomotion level  PARTICIPATION LIMITATIONS: meal prep, cleaning, laundry, driving, shopping, community activity, occupation, yard work, and church  PERSONAL FACTORS: Age, Fitness, Past/current experiences, Time since onset of injury/illness/exacerbation, and 3+ comorbidities: DM, gout, HLT, HTN, CABG 2009  are also affecting patient's functional outcome.   REHAB POTENTIAL: Good  CLINICAL DECISION MAKING: Evolving/moderate complexity  EVALUATION COMPLEXITY: Moderate   PLAN:  PT FREQUENCY: 1x/week  PT DURATION: 6 weeks  PLANNED INTERVENTIONS: 97164- PT Re-evaluation, 97110-Therapeutic exercises, 97530- Therapeutic activity, 97112- Neuromuscular re-education, 97535- Self Care, 29562- Manual therapy, [id number]- Gait training, [phone]- Canalith repositioning, 97014- Electrical stimulation (unattended), [phone]- Electrical stimulation (manual), Patient/Family education, Balance training, Stair training, Taping, Dry Needling, Vestibular training, Cryotherapy, and Moist heat  PLAN  FOR NEXT SESSION:  DC at this time    PHYSICAL THERAPY DISCHARGE SUMMARY  Visits from Start of Care: 5  Current functional level related to goals / functional outcomes: See above clinical impression   Remaining deficits: Remaining imbalance and mild dizziness    Education / Equipment: HEP  Plan: Patient agrees to discharge.  Patient goals were partially met. Patient is being discharged due to satisfaction with CLOF.      Baldemar Friday, PT, DPT 01/01/24 2:42 PM  [facility] at [facility] [address] [place], [address] [place], [place] [address] Phone # [phone] Fax # [phone]

## 2024-01-01 ENCOUNTER — Ambulatory Visit: Payer: Medicare (Managed Care) | Admitting: Physical Therapy

## 2024-01-01 ENCOUNTER — Encounter: Payer: Self-pay | Admitting: Physical Therapy

## 2024-01-01 DIAGNOSIS — R2689 Other abnormalities of gait and mobility: Secondary | ICD-10-CM

## 2024-01-01 DIAGNOSIS — R42 Dizziness and giddiness: Secondary | ICD-10-CM | POA: Diagnosis not present

## 2024-01-01 DIAGNOSIS — R2681 Unsteadiness on feet: Secondary | ICD-10-CM

## 2024-01-18 ENCOUNTER — Inpatient Hospital Stay: Payer: Medicare (Managed Care) | Attending: Hematology and Oncology

## 2024-01-18 DIAGNOSIS — Z7984 Long term (current) use of oral hypoglycemic drugs: Secondary | ICD-10-CM | POA: Diagnosis not present

## 2024-01-18 DIAGNOSIS — Z79899 Other long term (current) drug therapy: Secondary | ICD-10-CM | POA: Insufficient documentation

## 2024-01-18 DIAGNOSIS — N529 Male erectile dysfunction, unspecified: Secondary | ICD-10-CM | POA: Diagnosis not present

## 2024-01-18 DIAGNOSIS — D472 Monoclonal gammopathy: Secondary | ICD-10-CM | POA: Diagnosis present

## 2024-01-18 DIAGNOSIS — E785 Hyperlipidemia, unspecified: Secondary | ICD-10-CM | POA: Diagnosis not present

## 2024-01-18 DIAGNOSIS — R42 Dizziness and giddiness: Secondary | ICD-10-CM | POA: Diagnosis not present

## 2024-01-18 DIAGNOSIS — Z87891 Personal history of nicotine dependence: Secondary | ICD-10-CM | POA: Diagnosis not present

## 2024-01-18 DIAGNOSIS — I10 Essential (primary) hypertension: Secondary | ICD-10-CM | POA: Diagnosis not present

## 2024-01-18 DIAGNOSIS — I2510 Atherosclerotic heart disease of native coronary artery without angina pectoris: Secondary | ICD-10-CM | POA: Insufficient documentation

## 2024-01-18 LAB — CMP (CANCER CENTER ONLY)
ALT: 30 U/L (ref 0–44)
AST: 33 U/L (ref 15–41)
Albumin: 3.7 g/dL (ref 3.5–5.0)
Alkaline Phosphatase: 135 U/L — ABNORMAL HIGH (ref 38–126)
Anion gap: 5 (ref 5–15)
BUN: 31 mg/dL — ABNORMAL HIGH (ref 8–23)
CO2: 26 mmol/L (ref 22–32)
Calcium: 9.6 mg/dL (ref 8.9–10.3)
Chloride: 105 mmol/L (ref 98–111)
Creatinine: 1.57 mg/dL — ABNORMAL HIGH (ref 0.61–1.24)
GFR, Estimated: 46 mL/min — ABNORMAL LOW (ref >60)
Glucose, Bld: 171 mg/dL — ABNORMAL HIGH (ref 70–99)
Potassium: 4.2 mmol/L (ref 3.5–5.1)
Sodium: 136 mmol/L (ref 135–145)
Total Bilirubin: 0.5 mg/dL (ref 0.0–1.2)
Total Protein: 8 g/dL (ref 6.5–8.1)

## 2024-01-18 LAB — CBC WITH DIFFERENTIAL (CANCER CENTER ONLY)
Abs Immature Granulocytes: 0.02 10*3/uL (ref 0.00–0.07)
Basophils Absolute: 0.1 10*3/uL (ref 0.0–0.1)
Basophils Relative: 1 %
Eosinophils Absolute: 0.1 10*3/uL (ref 0.0–0.5)
Eosinophils Relative: 1 %
HCT: 42.2 % (ref 39.0–52.0)
Hemoglobin: 14 g/dL (ref 13.0–17.0)
Immature Granulocytes: 0 %
Lymphocytes Relative: 35 %
Lymphs Abs: 2.7 10*3/uL (ref 0.7–4.0)
MCH: 32 pg (ref 26.0–34.0)
MCHC: 33.2 g/dL (ref 30.0–36.0)
MCV: 96.6 fL (ref 80.0–100.0)
Monocytes Absolute: 0.5 10*3/uL (ref 0.1–1.0)
Monocytes Relative: 7 %
Neutro Abs: 4.4 10*3/uL (ref 1.7–7.7)
Neutrophils Relative %: 56 %
Platelet Count: 245 10*3/uL (ref 150–400)
RBC: 4.37 MIL/uL (ref 4.22–5.81)
RDW: 14.7 % (ref 11.5–15.5)
WBC Count: 7.8 10*3/uL (ref 4.0–10.5)
nRBC: 0 % (ref 0.0–0.2)

## 2024-01-18 LAB — LACTATE DEHYDROGENASE: LDH: 138 U/L (ref 98–192)

## 2024-01-21 LAB — KAPPA/LAMBDA LIGHT CHAINS
Kappa free light chain: 105.6 mg/L — ABNORMAL HIGH (ref 3.3–19.4)
Kappa, lambda light chain ratio: 2.45 — ABNORMAL HIGH (ref 0.26–1.65)
Lambda free light chains: 43.1 mg/L — ABNORMAL HIGH (ref 5.7–26.3)

## 2024-01-21 LAB — MULTIPLE MYELOMA PANEL, SERUM
Albumin SerPl Elph-Mcnc: 3.1 g/dL (ref 2.9–4.4)
Albumin/Glob SerPl: 0.8 (ref 0.7–1.7)
Alpha 1: 0.3 g/dL (ref 0.0–0.4)
Alpha2 Glob SerPl Elph-Mcnc: 0.9 g/dL (ref 0.4–1.0)
B-Globulin SerPl Elph-Mcnc: 1.8 g/dL — ABNORMAL HIGH (ref 0.7–1.3)
Gamma Glob SerPl Elph-Mcnc: 1.4 g/dL (ref 0.4–1.8)
Globulin, Total: 4.4 g/dL — ABNORMAL HIGH (ref 2.2–3.9)
IgA: 1193 mg/dL — ABNORMAL HIGH (ref 61–437)
IgG (Immunoglobin G), Serum: 1445 mg/dL (ref 603–1613)
IgM (Immunoglobulin M), Srm: 30 mg/dL (ref 15–143)
M Protein SerPl Elph-Mcnc: 0.7 g/dL — ABNORMAL HIGH
Total Protein ELP: 7.5 g/dL (ref 6.0–8.5)

## 2024-01-23 ENCOUNTER — Other Ambulatory Visit: Payer: Self-pay | Admitting: Family Medicine

## 2024-01-23 DIAGNOSIS — M4726 Other spondylosis with radiculopathy, lumbar region: Secondary | ICD-10-CM

## 2024-01-25 ENCOUNTER — Inpatient Hospital Stay (HOSPITAL_BASED_OUTPATIENT_CLINIC_OR_DEPARTMENT_OTHER): Payer: Medicare (Managed Care) | Admitting: Hematology and Oncology

## 2024-01-25 VITALS — BP 148/86 | HR 90 | Temp 97.7°F | Resp 16 | Wt 271.5 lb

## 2024-01-25 DIAGNOSIS — D472 Monoclonal gammopathy: Secondary | ICD-10-CM

## 2024-01-25 NOTE — Progress Notes (Signed)
 [name] [phone]   [phone]  PROGRESS NOTE  Patient Care Team: Deeann Saint, MD as PCP - General (Family Medicine) Glenford Peers, OD as Referring Physician (Optometry) [facility], [facility] Allyson Sabal, Delton See, MD as Consulting Physician (Cardiology)  Hematological/Oncological History # IgA kappa Monoclonal Gammopathy  10/05/2021: evaluated by [facility]. Found to have M protein 0.6, Kappa 130, Lambda 40.3, Ratio 3.25. IFE shows an IgA Kappa monoclonal protein. Cr 2.00 11/23/2021: establish care with Dr. Leonides Schanz  12/29/2021: Bone marrow biopsy performed showed hypercellular bone marrow for age with light plasmacytosis (5%)  Interval History:  [name] 78 y.o. male with medical history significant for an IgA kappa monoclonal gammopathy who presents for a follow up visit. The patient's last visit was on 07/20/2023. In the interim since the last visit he has had no major changes in his health.  On exam today [name] notes he has been well overall in the room since her last visit.  He said no major changes in his health.  He reports he is had no trouble with flu or norovirus.  Reports that he does have some occasional episodes of vertigo and dizziness.  He reports that he has good energy and appetite.  He has lost 7 pounds of weight intentionally as he is trying to lose it.  He reports he is trying to portion out his meals and eating more vegetables.  He reports he is not having any lower back pain and is not having any urinary symptoms.  He reports that he did take his blood pressure medication later today and that is why his blood pressure is elevated.  Overall he is at his baseline level health and has no questions or concerns today.  He denies any shortness of breath or chest pain.  A full 10 point ROS is listed below.  MEDICAL HISTORY:  Past Medical History:  Diagnosis Date   CAD, ARTERY BYPASS GRAFT  02/10/2008   CAROTID ARTERY STENOSIS, WITHOUT INFARCTION 09/22/2008   CORONARY ARTERY DISEASE 04/03/2008   Diabetes mellitus    "borderline"   ERECTILE DYSFUNCTION 01/14/2009   GOUT 01/16/2008   HERPES ZOSTER 04/15/2010   HYPERLIPIDEMIA 01/16/2008   HYPERTENSION 01/16/2008   IMPAIRED GLUCOSE TOLERANCE 08/22/2010   Shingles 2011   left chest   Ulcer 30 yrs ago   stomach    SURGICAL HISTORY: Past Surgical History:  Procedure Laterality Date   COLONOSCOPY     3 months ago   CORONARY ARTERY BYPASS GRAFT  2009   vessels x3   EAR CYST EXCISION N/A 07/29/2013   Procedure: excision of urachal cyst flexible cystoscopy insertion of foley cath;  Surgeon: Antony Haste, MD;  Location: [facility];  Service: Urology;  Laterality: N/A;   LAPAROSCOPY N/A 07/29/2013   Procedure: LAPAROSCOPY DIAGNOSTIC  laparoscopic excision of urachal cyst ;  Surgeon: Lodema Pilot, DO;  Location: [facility];  Service: General;  Laterality: N/A;   LAPAROTOMY  08/25/2012   Procedure: EXPLORATORY LAPAROTOMY;  Surgeon: Lodema Pilot, DO;  Location: [facility];  Service: General;  Laterality: N/A;  incision and drainage abdominal wall abcessand intra abdominal wall abcess   PARTIAL GASTRECTOMY  1974   bleeding ulcers   urachal  2014   Urachal cyst removal    SOCIAL HISTORY: Social History   Socioeconomic History   Marital status: Married    Spouse name: Not on file   Number of children: 3   Years of  education: Not on file   Highest education level: Not on file  Occupational History    Employer: [employer]  Tobacco Use   Smoking status: Former    Current packs/day: 0.00    Average packs/day: 1 pack/day for 10.0 years (10.0 ttl pk-yrs)    Types: Cigarettes    Start date: 07/26/1989    Quit date: 07/27/1999    Years since quitting: 24.5   Smokeless tobacco: Never  Substance and Sexual Activity   Alcohol use: Yes    Comment: 4 beers a day   Drug use: No   Sexual activity: Not on file  Other Topics Concern    Not on file  Social History Narrative   epworth sleepiness scale score: 4   Social Drivers of Corporate investment banker Strain: Low Risk  (07/14/2022)   Overall Financial Resource Strain (CARDIA)    Difficulty of Paying Living Expenses: Not hard at all  Food Insecurity: No Food Insecurity (07/14/2022)   Hunger Vital Sign    Worried About Running Out of Food in the Last Year: Never true    Ran Out of Food in the Last Year: Never true  Transportation Needs: No Transportation Needs (07/14/2022)   PRAPARE - Administrator, Civil Service (Medical): No    Lack of Transportation (Non-Medical): No  Physical Activity: Insufficiently Active (07/14/2022)   Exercise Vital Sign    Days of Exercise per Week: 3 days    Minutes of Exercise per Session: 20 min  Stress: No Stress Concern Present (07/14/2022)   [employer] of Occupational Health - Occupational Stress Questionnaire    Feeling of Stress : Not at all  Social Connections: Socially Integrated (07/14/2022)   Social Connection and Isolation Panel [NHANES]    Frequency of Communication with Friends and Family: More than three times a week    Frequency of Social Gatherings with Friends and Family: More than three times a week    Attends Religious Services: More than 4 times per year    Active Member of [employer] or Organizations: Yes    Attends Engineer, structural: More than 4 times per year    Marital Status: Married  Catering manager Violence: Not At Risk (07/14/2022)   Humiliation, Afraid, Rape, and Kick questionnaire    Fear of Current or Ex-Partner: No    Emotionally Abused: No    Physically Abused: No    Sexually Abused: No    FAMILY HISTORY: Family History  Problem Relation Age of Onset   Heart disease Mother    Heart disease Sister    Colon cancer Neg Hx     ALLERGIES:  has no known allergies.  MEDICATIONS:  Current Outpatient Medications  Medication Sig Dispense Refill   allopurinol (ZYLOPRIM) 100 MG  tablet TAKE 1 TABLET EVERY DAY 90 tablet 3   amLODipine (NORVASC) 10 MG tablet TAKE 1 TABLET EVERY DAY 90 tablet 3   atorvastatin (LIPITOR) 80 MG tablet TAKE 1 TABLET BY MOUTH EVERY DAY IN THE MORNING 90 tablet 2   colchicine 0.6 MG tablet Take 2 tabs (1.2mg ) at fist sign of gout flare.  Then take 1 tab 1 hour later.  On day 2 take one tab daily until flare stops. 30 tablet 0   Continuous Blood Gluc Receiver (FREESTYLE LIBRE 14 DAY READER) DEVI Use as directed 1 each 0   Continuous Blood Gluc Receiver (FREESTYLE LIBRE READER) DEVI Use with Freestyle Sensor to check blood sugar levels.  1 each 0   dapagliflozin propanediol (FARXIGA) 10 MG TABS tablet Take 10 mg by mouth daily.     gabapentin (NEURONTIN) 100 MG capsule TAKE 1 CAPSULE (100 MG TOTAL) BY MOUTH AT BEDTIME AS NEEDED (PAIN). 90 capsule 1   glucose blood (ONE TOUCH ULTRA TEST) test strip 1 each by Other route daily. Use as instructed 100 each 12   Lancets (ONETOUCH ULTRASOFT) lancets 1 each by Other route daily. Use as instructed 100 each 12   LOKELMA 10 g PACK packet Take 1 packet by mouth daily.     nitroGLYCERIN (NITROSTAT) 0.4 MG SL tablet Place 1 tablet (0.4 mg total) under the tongue every 5 (five) minutes as needed for chest pain. 15 tablet 5   sildenafil (VIAGRA) 25 MG tablet Take 1 tablet (25 mg total) by mouth daily as needed for erectile dysfunction. 10 tablet 1   traMADol (ULTRAM) 50 MG tablet TAKE 2 TABLETS BY MOUTH EVERY 12 HOURS AS NEEDED 120 tablet 1   No current facility-administered medications for this visit.    REVIEW OF SYSTEMS:   Constitutional: ( - ) fevers, ( - )  chills , ( - ) night sweats Eyes: ( - ) blurriness of vision, ( - ) double vision, ( - ) watery eyes Ears, nose, mouth, throat, and face: ( - ) mucositis, ( - ) sore throat Respiratory: ( - ) cough, ( - ) dyspnea, ( - ) wheezes Cardiovascular: ( - ) palpitation, ( - ) chest discomfort, ( - ) lower extremity swelling Gastrointestinal:  ( - ) nausea, ( -  ) heartburn, ( - ) change in bowel habits Skin: ( - ) abnormal skin rashes Lymphatics: ( - ) new lymphadenopathy, ( - ) easy bruising Neurological: ( - ) numbness, ( - ) tingling, ( - ) new weaknesses Behavioral/Psych: ( - ) mood change, ( - ) new changes  All other systems were reviewed with the patient and are negative.  PHYSICAL EXAMINATION:  Vitals:   01/25/24 1457  BP: (!) 148/86  Pulse: 90  Resp: 16  Temp: 97.7 F (36.5 C)  SpO2: 95%    Filed Weights   01/25/24 1457  Weight: 271 lb 8 oz (123.2 kg)     GENERAL: Well-appearing elderly male, alert, no distress and comfortable SKIN: skin color, texture, turgor are normal, no rashes or significant lesions EYES: conjunctiva are pink and non-injected, sclera clear LUNGS: clear to auscultation and percussion with normal breathing effort HEART: regular rate & rhythm and no murmurs and no lower extremity edema Musculoskeletal: no cyanosis of digits and no clubbing  PSYCH: alert & oriented x 3, fluent speech NEURO: no focal motor/sensory deficits  LABORATORY DATA:  I have reviewed the data as listed    Latest Ref Rng & Units 01/18/2024    1:32 PM 10/19/2023    3:04 PM 07/12/2023   12:00 PM  CBC  WBC 4.0 - 10.5 K/uL 7.8  6.2  6.6   Hemoglobin 13.0 - 17.0 g/dL 86.5  78.4  69.6   Hematocrit 39.0 - 52.0 % 42.2  43.7  40.4   Platelets 150 - 400 K/uL 245  275  213        Latest Ref Rng & Units 01/18/2024    1:32 PM 11/16/2023   12:29 PM 10/19/2023    3:04 PM  CMP  Glucose 70 - 99 mg/dL 295  284  132   BUN 8 - 23 mg/dL 31  31  25  Creatinine 0.61 - 1.24 mg/dL 6.04  5.40  9.81   Sodium 135 - 145 mmol/L 136  137  140   Potassium 3.5 - 5.1 mmol/L 4.2  4.3  4.4   Chloride 98 - 111 mmol/L 105  100  105   CO2 22 - 32 mmol/L 26  27  27    Calcium 8.9 - 10.3 mg/dL 9.6  9.8  9.9   Total Protein 6.5 - 8.1 g/dL 8.0  7.8  7.7   Total Bilirubin 0.0 - 1.2 mg/dL 0.5  0.9  0.8   Alkaline Phos 38 - 126 U/L 135  243    AST 15 - 41 U/L  33  40  32   ALT 0 - 44 U/L 30  49  41     Lab Results  Component Value Date   MPROTEIN 0.7 (H) 01/18/2024   MPROTEIN 0.7 (H) 07/12/2023   MPROTEIN 0.6 (H) 01/11/2023   Lab Results  Component Value Date   KPAFRELGTCHN 105.6 (H) 01/18/2024   KPAFRELGTCHN 104.9 (H) 07/12/2023   KPAFRELGTCHN 119.7 (H) 01/11/2023   LAMBDASER 43.1 (H) 01/18/2024   LAMBDASER 38.2 (H) 07/12/2023   LAMBDASER 39.7 (H) 01/11/2023   KAPLAMBRATIO 2.45 (H) 01/18/2024   KAPLAMBRATIO 7.79 07/31/2023   KAPLAMBRATIO 2.75 (H) 07/12/2023    RADIOGRAPHIC STUDIES: No results found.  ASSESSMENT & PLAN [name] 78 y.o. male with medical history significant for an IgA kappa monoclonal gammopathy who presents for a follow up visit.   Monoclonal Gammopathies are a group of medical conditions defined by the presence of a monoclonal protein (an M protein) in the blood or urine. Monoclonal gammopathies include monoclonal gammopathy of unknown significance (MGUS), Monoclonal gammopathies of renal or neurological significance, smolder multiple myeloma (SMM), multiple myeloma (MM), AL amyloidosis, and Waldenstrom macroglobulinemia. The goal of the initial workup is to determine which monoclonal gammopathy a patient has. The workup consists of evaluating protein in the serum (with serum protein electrophoresis (SPEP) and serum free light chains) , evaluating protein in the urine (UPEP), and evaluation of the skeleton (DG Bone Met Survey) to assure no lytic lesions. Baseline bloodwork includes CMP and CBC. If no CRAB criteria or high risk criteria are noted then the diagnosis is MGUS. MGUS must be followed with bloodwork periodically to assure it does not convert to multiple myeloma (occurs to approximately 1% of patients per year). If there are CRAB criteria or high risk features (such as elevated serum free light chain ratio (taking into account renal function), a non IgG M protein, or M protein >1.5) then a bone marrow biopsy  must be pursued.  # IgA kappa Monoclonal Gammopathy of Undetermined Significance -- Initial findings concerning for a known IgG monoclonal gammopathy with chronic kidney disease.   --Bone marrow biopsy on 12/29/2021 showed 5% plasmacytosis with no evidence of a plasma cell neoplasm Plan:  -- At each visit will order an SPEP, SFLC, CBC, CMP, and LDH --recommend a metastatic bone survey to assess for lytic lesions and UPEP at least once every 2 years.  Last scan in Sept 2024.  --labs today show creatinine 1.57, white blood cell 7.8, hemoglobin 14.0, MCV 96.6, platelets 245 --M protein 0.7, kappa 105.6, lambda 43.1, ratio 2.45 --Encourage continued follow-up with the patient's nephrologist for monitoring of his elevated creatinine. -- Return to clinic in 12 months time for further evaluation   No orders of the defined types were placed in this encounter.   All questions were answered. The  patient knows to call the clinic with any problems, questions or concerns.  A total of more than 25 minutes were spent on this encounter with face-to-face time and non-face-to-face time, including preparing to see the patient, ordering tests and/or medications, counseling the patient and coordination of care as outlined above.   Ulysees Barns, MD Department of Hematology/Oncology [facility] Phone: [phone] Pager: [phone] Email: [email or web address]  01/29/2024 5:33 PM   Literature Support:  1) Kyle RA, Durie BG, Rajkumar SV, et al. Monoclonal gammopathy of undetermined significance (MGUS) and smoldering (asymptomatic) multiple myeloma: IMWG consensus perspectives risk factors for progression and guidelines for monitoring and management. Leukemia. 2010;24(6):1121-1127. doi:10.1038/leu.2010.60   --If a patient with apparent MGUS has a serum monoclonal protein >15 g/l, IgA or IgM protein type, or an abnormal FLC ratio, a BM aspirate and biopsy should  be carried out at baseline to rule out underlying PC malignancy.   2) Ronaldo Miyamoto RA, Therneau TM, Rajkumar SV, et al. Prevalence of monoclonal gammopathy of undetermined significance. N Engl J Med 2006;354:1362-1369   --MGUS was found in 3.2 percent of persons 20 years of age or older and 5.3 percent of persons 58 years of age or older.

## 2024-03-12 ENCOUNTER — Telehealth: Payer: Self-pay | Admitting: Family Medicine

## 2024-03-12 NOTE — Telephone Encounter (Signed)
 Called and left patient a VM per DPR, patient is aware

## 2024-03-12 NOTE — Telephone Encounter (Signed)
 Copied from CRM [id number]. Topic: Clinical - Medication Refill >> Mar 12, 2024  8:59 AM Caliyah H wrote: Medication: allopurinol  (ZYLOPRIM )  Has the patient contacted their pharmacy? Yes (Agent: If no, request that the patient contact the pharmacy for the refill. If patient does not wish to contact the pharmacy document the reason why and proceed with request.) (Agent: If yes, when and what did the pharmacy advise?)  This is the patient's preferred pharmacy:  [id number] [place], [place] - [address] [place] [address] Phone: [phone] Fax: [phone]  Is this the correct pharmacy for this prescription? Yes If no, delete pharmacy and type the correct one.   Has the prescription been filled recently? No  Is the patient out of the medication? Yes  Has the patient been seen for an appointment in the last year OR does the patient have an upcoming appointment? Yes   Can we respond through MyChart? No  Agent: Please be advised that Rx refills may take up to 3 business days. We ask that you follow-up with your pharmacy.

## 2024-03-12 NOTE — Telephone Encounter (Signed)
 Pt requested a refill of allopurinol  100mg  daily. On 11/01/2023 prescription was written for 90 tablets with 3 refills. RN called pt to advise him that he should have refills at the pharmacy. RN received the message "call cannot be completed as dialed." RN unable to make contact with the pt.

## 2024-03-17 ENCOUNTER — Telehealth: Payer: Self-pay

## 2024-03-17 DIAGNOSIS — M109 Gout, unspecified: Secondary | ICD-10-CM

## 2024-03-17 MED ORDER — ALLOPURINOL 100 MG PO TABS: 100.0000 mg | ORAL_TABLET | Freq: Every day | ORAL | 3 refills | Status: AC

## 2024-03-17 MED ORDER — COLCHICINE 0.6 MG PO TABS: ORAL_TABLET | ORAL | 0 refills | Status: DC

## 2024-03-17 NOTE — Telephone Encounter (Signed)
 Copied from CRM [id number]. Topic: Clinical - Medication Question >> Mar 17, 2024  3:21 PM [place] E wrote: Reason for CRM: Patient's spouse was calling in regarding allopurinol  (ZYLOPRIM ) 100 MG tablet. Stated that she spoke with the pharmacy yesterday and they still did not have it. Advised spouse that refills may take up to 3 business days with today being the 3rd day, stated she will give the pharmacy a callback today. >> Mar 17, 2024  4:01 PM Caliyah H wrote: Patient's wife called to follow up on a message from earlier today.She stated that the pharmacy still has not received the prescription. The patient has 3 refills remaining. The original refill request was placed on 03/12/24, but after reviewing the e-prescribe status, the last update shows as 10/30/23 with no recent transmission.

## 2024-03-17 NOTE — Telephone Encounter (Signed)
 Medication has been refilled.

## 2024-03-18 ENCOUNTER — Telehealth: Payer: Self-pay

## 2024-03-18 NOTE — Telephone Encounter (Signed)
 Copied from CRM [id number]. Topic: Clinical - Medication Question >> Mar 17, 2024  3:21 PM [place] E wrote: Reason for CRM: Patient's spouse was calling in regarding allopurinol  (ZYLOPRIM ) 100 MG tablet. Stated that she spoke with the pharmacy yesterday and they still did not have it. Advised spouse that refills may take up to 3 business days with today being the 3rd day, stated she will give the pharmacy a callback today. >> Mar 17, 2024  4:01 PM Caliyah H wrote: Patient's wife called to follow up on a message from earlier today.She stated that the pharmacy still has not received the prescription. The patient has 3 refills remaining. The original refill request was placed on 03/12/24, but after reviewing the e-prescribe status, the last update shows as 10/30/23 with no recent transmission.

## 2024-03-26 ENCOUNTER — Other Ambulatory Visit: Payer: Self-pay | Admitting: Family Medicine

## 2024-03-26 DIAGNOSIS — M109 Gout, unspecified: Secondary | ICD-10-CM

## 2024-04-15 ENCOUNTER — Other Ambulatory Visit: Payer: Self-pay | Admitting: Family Medicine

## 2024-04-21 ENCOUNTER — Other Ambulatory Visit: Payer: Self-pay | Admitting: Family Medicine

## 2024-04-21 MED ORDER — AMLODIPINE BESYLATE 10 MG PO TABS: 10.0000 mg | ORAL_TABLET | Freq: Every day | ORAL | 3 refills | Status: AC

## 2024-04-21 NOTE — Telephone Encounter (Signed)
 Copied from CRM [id number]. Topic: Clinical - Medication Refill >> Apr 21, 2024  3:08 PM [place] wrote: Medication: amLODipine  (NORVASC ) 10 MG tablet  Has the patient contacted their pharmacy? Yes (Agent: If no, request that the patient contact the pharmacy for the refill. If patient does not wish to contact the pharmacy document the reason why and proceed with request.) (Agent: If yes, when and what did the pharmacy advise?)  This is the patient's preferred pharmacy:  [id number] [place], [place] - [address] [place] [address] Phone: [phone] Fax: [phone]   Is this the correct pharmacy for this prescription? Yes If no, delete pharmacy and type the correct one.   Has the prescription been filled recently? Yes  Is the patient out of the medication? Yes  Has the patient been seen for an appointment in the last year OR does the patient have an upcoming appointment? Yes  Can we respond through MyChart? Yes  Agent: Please be advised that Rx refills may take up to 3 business days. We ask that you follow-up with your pharmacy.

## 2024-04-30 ENCOUNTER — Ambulatory Visit: Payer: Medicare (Managed Care) | Admitting: Family Medicine

## 2024-05-15 ENCOUNTER — Ambulatory Visit: Payer: Medicare (Managed Care) | Admitting: Family Medicine

## 2024-05-15 ENCOUNTER — Encounter: Payer: Self-pay | Admitting: Family Medicine

## 2024-05-15 VITALS — BP 142/74 | HR 70 | Temp 98.4°F | Ht 71.0 in | Wt 270.0 lb

## 2024-05-15 DIAGNOSIS — K8020 Calculus of gallbladder without cholecystitis without obstruction: Secondary | ICD-10-CM

## 2024-05-15 DIAGNOSIS — Z7984 Long term (current) use of oral hypoglycemic drugs: Secondary | ICD-10-CM

## 2024-05-15 DIAGNOSIS — E1122 Type 2 diabetes mellitus with diabetic chronic kidney disease: Secondary | ICD-10-CM

## 2024-05-15 DIAGNOSIS — M545 Low back pain, unspecified: Secondary | ICD-10-CM

## 2024-05-15 DIAGNOSIS — N183 Chronic kidney disease, stage 3 unspecified: Secondary | ICD-10-CM | POA: Diagnosis not present

## 2024-05-15 DIAGNOSIS — R8290 Unspecified abnormal findings in urine: Secondary | ICD-10-CM | POA: Diagnosis not present

## 2024-05-15 DIAGNOSIS — K760 Fatty (change of) liver, not elsewhere classified: Secondary | ICD-10-CM

## 2024-05-15 DIAGNOSIS — F1010 Alcohol abuse, uncomplicated: Secondary | ICD-10-CM | POA: Diagnosis not present

## 2024-05-15 DIAGNOSIS — I10 Essential (primary) hypertension: Secondary | ICD-10-CM

## 2024-05-15 DIAGNOSIS — R2681 Unsteadiness on feet: Secondary | ICD-10-CM | POA: Diagnosis not present

## 2024-05-15 LAB — POCT URINALYSIS DIPSTICK
Bilirubin, UA: NEGATIVE
Blood, UA: NEGATIVE
Glucose, UA: POSITIVE — AB
Ketones, UA: NEGATIVE
Leukocytes, UA: NEGATIVE
Nitrite, UA: NEGATIVE
Protein, UA: POSITIVE — AB
Spec Grav, UA: 1.02 (ref 1.010–1.025)
Urobilinogen, UA: 0.2 U/dL
pH, UA: 6 (ref 5.0–8.0)

## 2024-05-15 NOTE — Progress Notes (Signed)
 Established Patient Office Visit   Subjective  Patient ID: [name], male    DOB: 1946-06-18  Age: 78 y.o. MRN: [id number]  Chief Complaint  Patient presents with   Medical Management of Chronic Issues    Follow-up for Blood pressure     Pt is a 78 year old male seen for follow-up on chronic conditions.  Patient endorses change in urine.  Notices odor is stronger.  Also endorses right low back pain x months.  Feels like a strain.  A dull pain with sitting.  Sharper with getting up from seated position.  Pt feels like he is leaning to the side when walking.  Endorses nausea at night.  Denies fever, chills, constipation, diarrhea.  Patient concerned about his drinking.  Wants to quit.  Feels guilty about drinking.  Has cut down. Currently drinking 3-4 beers per evening.  Was drinking 7-8.States only drinking in the afternoon.  Noticing mild tremors around 3:30 PM.  Does not need an eye-opener.  Patient denies getting in trouble legally due to drinking.  Notes his wife would want him to quit.  Patient is her caregiver.  Endorses family history of alcoholism.  Patient inquires about fatty liver disease noted on prior studies.  Patient requesting refill on tramadol  for chronic pain back pain.  Followed by hematology for MGUS.  Patient was followed by nephrology.  Thinks he may have missed appointments.  Requesting referral.    Patient Active Problem List   Diagnosis Date Noted   Fatty liver 11/30/2023   Calculus of gallbladder without cholecystitis without obstruction 11/30/2023   Aortic stenosis 10/23/2023   MGUS (monoclonal gammopathy of unknown significance) 10/21/2023   Secondary hyperparathyroidism (HCC) 10/21/2023   Intracranial carotid stenosis, bilateral 10/26/2021   Acute renal failure superimposed on stage 3a chronic kidney disease (HCC) 10/23/2021   Aphasia 10/23/2021   Hyponatremia 10/23/2021   Hyperkalemia 10/23/2021   TIA (transient ischemic attack) 10/23/2021    Metabolic acidosis 10/23/2021   Elevated blood-pressure reading, without diagnosis of hypertension 04/28/2021   Body mass index (BMI) 40.0-44.9, adult (HCC) 04/28/2021   Lumbar spondylosis 04/28/2021   ETOH abuse 08/10/2020   Nocturia 08/10/2020   Heart burn 08/10/2020   Left hip pain 06/06/2018   DM (diabetes mellitus) type II controlled with renal manifestation (HCC) 12/01/2016   Medicare annual wellness visit, subsequent 12/01/2016   BPH (benign prostatic hyperplasia) 01/28/2015   Type 2 diabetes mellitus with peripheral vascular disease (HCC) 06/20/2013   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 07/26/2012   Herpes zoster 04/15/2010   ERECTILE DYSFUNCTION 01/14/2009   Occlusion and stenosis of carotid artery 09/22/2008   Coronary atherosclerosis 04/03/2008   CAD, ARTERY BYPASS GRAFT 02/10/2008   Dyslipidemia 01/16/2008   GOUT 01/16/2008   Essential hypertension 01/16/2008   Past Medical History:  Diagnosis Date   CAD, ARTERY BYPASS GRAFT 02/10/2008   CAROTID ARTERY STENOSIS, WITHOUT INFARCTION 09/22/2008   CORONARY ARTERY DISEASE 04/03/2008   Diabetes mellitus    borderline   ERECTILE DYSFUNCTION 01/14/2009   GOUT 01/16/2008   HERPES ZOSTER 04/15/2010   HYPERLIPIDEMIA 01/16/2008   HYPERTENSION 01/16/2008   IMPAIRED GLUCOSE TOLERANCE 08/22/2010   Shingles 2011   left chest   Ulcer 30 yrs ago   stomach   Past Surgical History:  Procedure Laterality Date   COLONOSCOPY     3 months ago   CORONARY ARTERY BYPASS GRAFT  2009   vessels x3   EAR CYST EXCISION N/A 07/29/2013   Procedure: excision of  urachal cyst flexible cystoscopy insertion of foley cath;  Surgeon: Donnice Gwenyth Brooks, MD;  Location: [facility];  Service: Urology;  Laterality: N/A;   LAPAROSCOPY N/A 07/29/2013   Procedure: LAPAROSCOPY DIAGNOSTIC  laparoscopic excision of urachal cyst ;  Surgeon: Redell Faith, DO;  Location: [facility];  Service: General;  Laterality: N/A;   LAPAROTOMY  08/25/2012   Procedure:  EXPLORATORY LAPAROTOMY;  Surgeon: Redell Faith, DO;  Location: [facility];  Service: General;  Laterality: N/A;  incision and drainage abdominal wall abcessand intra abdominal wall abcess   PARTIAL GASTRECTOMY  1974   bleeding ulcers   urachal  2014   Urachal cyst removal   Social History   Tobacco Use   Smoking status: Former    Current packs/day: 0.00    Average packs/day: 1 pack/day for 10.0 years (10.0 ttl pk-yrs)    Types: Cigarettes    Start date: 07/26/1989    Quit date: 07/27/1999    Years since quitting: 24.8   Smokeless tobacco: Never  Substance Use Topics   Alcohol use: Yes    Comment: 4 beers a day   Drug use: No   Family History  Problem Relation Age of Onset   Heart disease Mother    Heart disease Sister    Colon cancer Neg Hx    No Known Allergies  ROS Negative unless stated above    Objective:     BP (!) 142/74 (BP Location: Left Arm, Patient Position: Sitting, Cuff Size: Normal)   Pulse 70   Temp 98.4 F (36.9 C) (Oral)   Ht 5' 11 (1.803 m)   Wt 270 lb (122.5 kg)   SpO2 (!) 9%   BMI 37.66 kg/m  BP Readings from Last 3 Encounters:  05/15/24 (!) 142/74  01/25/24 (!) 148/86  10/23/23 117/77   Wt Readings from Last 3 Encounters:  05/15/24 270 lb (122.5 kg)  01/25/24 271 lb 8 oz (123.2 kg)  10/23/23 278 lb (126.1 kg)      Physical Exam Constitutional:      General: He is not in acute distress.    Appearance: Normal appearance.  HENT:     Head: Normocephalic and atraumatic.     Nose: Nose normal.     Mouth/Throat:     Mouth: Mucous membranes are moist.  Cardiovascular:     Rate and Rhythm: Normal rate and regular rhythm.     Heart sounds: Normal heart sounds. No murmur heard.    No gallop.  Pulmonary:     Effort: Pulmonary effort is normal. No respiratory distress.     Breath sounds: Normal breath sounds. No wheezing, rhonchi or rales.  Abdominal:     General: Bowel sounds are normal.     Palpations: Abdomen is soft.     Tenderness:  There is no abdominal tenderness. There is no guarding.  Musculoskeletal:     Left hand: Deformity present.     Cervical back: Normal.     Thoracic back: Normal.     Lumbar back: Normal.     Comments: No TTP of cervical, thoracic, lumbar or paraspinal muscles  Skin:    General: Skin is warm and dry.     Comments: No spider telangiectasia.  Neurological:     Mental Status: He is alert and oriented to person, place, and time.        09/06/2023    2:40 PM 07/19/2023    1:03 PM 04/05/2023    1:34 PM  Depression screen PHQ  2/9  Decreased Interest 0 0 0  Down, Depressed, Hopeless 0 0 0  PHQ - 2 Score 0 0 0  Altered sleeping 0    Tired, decreased energy 0    Change in appetite 0    Feeling bad or failure about yourself  0    Trouble concentrating 0    Moving slowly or fidgety/restless 0    Suicidal thoughts 0    PHQ-9 Score 0    Difficult doing work/chores Not difficult at all        09/06/2023    2:40 PM  GAD 7 : Generalized Anxiety Score  Nervous, Anxious, on Edge 0  Control/stop worrying 0  Worry too much - different things 0  Trouble relaxing 0  Restless 0  Easily annoyed or irritable 0  Afraid - awful might happen 0  Total GAD 7 Score 0  Anxiety Difficulty Not difficult at all     No results found for any visits on 05/15/24.    Assessment & Plan:   Malodorous urine -     POCT urinalysis dipstick -     Comprehensive metabolic panel with GFR; Future -     CT RENAL STONE STUDY; Future  Right-sided low back pain without sciatica, unspecified chronicity -     POCT urinalysis dipstick -     Comprehensive metabolic panel with GFR; Future -     Lipase -     CT RENAL STONE STUDY; Future  Controlled type 2 diabetes mellitus with stage 3 chronic kidney disease, without long-term current use of insulin  (HCC) -     Ambulatory referral to Nephrology  ETOH abuse -     CBC with Differential/Platelet; Future -     Comprehensive metabolic panel with GFR; Future -      Ambulatory referral to Gastroenterology  Fatty liver -     CBC with Differential/Platelet; Future -     Comprehensive metabolic panel with GFR; Future -     Lipase -     Ambulatory referral to Gastroenterology  Calculus of gallbladder without cholecystitis without obstruction -     CBC with Differential/Platelet; Future -     Comprehensive metabolic panel with GFR; Future -     Lipase -     Ambulatory referral to Gastroenterology  Essential hypertension  Unsteady gait  Concern for renal calculi with history of back pain and malodorous urine.  Obtain UA, CT stone study, and labs.  Discussed increasing intake of water .  Diabetes controlled.  Hemoglobin A1c this visit 6.0%.  Continue Farxiga 10 mg daily.  BP elevated.  Possibly 2/2 back pain.  Continue current medication including Norvasc  10 mg daily.  Discussed the importance of lifestyle modifications.  New referral to [facility] given history of CKD  Discussed continuing to decrease intake of alcohol.  Currently drinking 3-4 beers per evening, was drinking 7-8.  Patient advised against stopping cold [place].  Also discussed medication options to help with alcohol cessation and programs.  Patient wishes to think about it.  Return in about 6 weeks (around 06/26/2024).   Clotilda JONELLE Single, MD

## 2024-05-16 LAB — COMPREHENSIVE METABOLIC PANEL WITH GFR
ALT: 24 U/L (ref 0–53)
AST: 27 U/L (ref 0–37)
Albumin: 4 g/dL (ref 3.5–5.2)
Alkaline Phosphatase: 119 U/L — ABNORMAL HIGH (ref 39–117)
BUN: 32 mg/dL — ABNORMAL HIGH (ref 6–23)
CO2: 24 meq/L (ref 19–32)
Calcium: 10 mg/dL (ref 8.4–10.5)
Chloride: 104 meq/L (ref 96–112)
Creatinine, Ser: 1.93 mg/dL — ABNORMAL HIGH (ref 0.40–1.50)
GFR: 33.38 mL/min — ABNORMAL LOW (ref >60.00)
Glucose, Bld: 107 mg/dL — ABNORMAL HIGH (ref 70–99)
Potassium: 4.5 meq/L (ref 3.5–5.1)
Sodium: 137 meq/L (ref 135–145)
Total Bilirubin: 0.7 mg/dL (ref 0.2–1.2)
Total Protein: 8.3 g/dL (ref 6.0–8.3)

## 2024-05-16 LAB — CBC WITH DIFFERENTIAL/PLATELET
Basophils Absolute: 0.1 K/uL (ref 0.0–0.1)
Basophils Relative: 1 % (ref 0.0–3.0)
Eosinophils Absolute: 0.1 K/uL (ref 0.0–0.7)
Eosinophils Relative: 1.2 % (ref 0.0–5.0)
HCT: 44.2 % (ref 39.0–52.0)
Hemoglobin: 14.5 g/dL (ref 13.0–17.0)
Lymphocytes Relative: 19.1 % (ref 12.0–46.0)
Lymphs Abs: 1.6 K/uL (ref 0.7–4.0)
MCHC: 32.9 g/dL (ref 30.0–36.0)
MCV: 98.3 fl (ref 78.0–100.0)
Monocytes Absolute: 0.7 K/uL (ref 0.1–1.0)
Monocytes Relative: 8.2 % (ref 3.0–12.0)
Neutro Abs: 5.9 K/uL (ref 1.4–7.7)
Neutrophils Relative %: 70.5 % (ref 43.0–77.0)
Platelets: 214 K/uL (ref 150.0–400.0)
RBC: 4.5 Mil/uL (ref 4.22–5.81)
RDW: 15.1 % (ref 11.5–15.5)
WBC: 8.3 K/uL (ref 4.0–10.5)

## 2024-05-16 LAB — LIPASE: Lipase: 28 U/L (ref 11.0–59.0)

## 2024-05-22 ENCOUNTER — Ambulatory Visit
Admission: RE | Admit: 2024-05-22 | Discharge: 2024-05-22 | Disposition: A | Discharge status: Home / Self Care | Payer: Medicare (Managed Care) | Source: Ambulatory Visit | Attending: Family Medicine | Admitting: Family Medicine

## 2024-05-22 DIAGNOSIS — K8020 Calculus of gallbladder without cholecystitis without obstruction: Secondary | ICD-10-CM | POA: Diagnosis not present

## 2024-05-22 DIAGNOSIS — N3289 Other specified disorders of bladder: Secondary | ICD-10-CM | POA: Diagnosis not present

## 2024-05-22 DIAGNOSIS — K5730 Diverticulosis of large intestine without perforation or abscess without bleeding: Secondary | ICD-10-CM | POA: Diagnosis not present

## 2024-05-22 DIAGNOSIS — M545 Low back pain, unspecified: Secondary | ICD-10-CM

## 2024-05-22 DIAGNOSIS — K4020 Bilateral inguinal hernia, without obstruction or gangrene, not specified as recurrent: Secondary | ICD-10-CM | POA: Diagnosis not present

## 2024-05-22 DIAGNOSIS — R8290 Unspecified abnormal findings in urine: Secondary | ICD-10-CM

## 2024-05-30 ENCOUNTER — Ambulatory Visit: Payer: Self-pay | Admitting: Family Medicine

## 2024-05-30 ENCOUNTER — Telehealth: Payer: Self-pay

## 2024-05-30 ENCOUNTER — Telehealth: Payer: Self-pay | Admitting: Family Medicine

## 2024-05-30 ENCOUNTER — Ambulatory Visit: Payer: Self-pay

## 2024-05-30 NOTE — Telephone Encounter (Signed)
 CRITICAL VALUE STICKER  CRITICAL VALUE: CT Renal Stone Impression: IMPRESSION: 1. Partially visualized right inguinal hernia containing the majority of the urinary bladder. Associated urinary bladder wall thickening likely due to inflammatory/reactive changes. Correlate with urinalysis. Correlate clinically for incarceration. 2.   Small fat containing left inguinal hernia. 3. Colonic diverticulosis with no acute diverticulitis. 4. Cholelithiasis no CT evidence of acute cholecystitis. 5. Right femoral head avascular necrosis.  RECEIVER (on-site recipient of call): Camelia Hind, BSN, RN  DATE & TIME NOTIFIED: 05/30/2024 [phone]  MESSENGER (representative from lab): Karna COWARD  MD NOTIFIED: Clotilda Single, MD  TIME OF NOTIFICATION: [phone]  RESPONSE: Aware of results

## 2024-05-30 NOTE — Telephone Encounter (Signed)
 Randine from [facility] called in critical CT result for patient. Released in Epic. CAL called and spoke to Crissy.  Resulted: 05/29/24  Impression:  1. Partially visualized right inguinal hernia containing the majority of the urinary bladder. Associated urinary bladder wall thickening likely due to inflammatory/reactive changes. Correlate with urinalysis. Correlate clinically for incarceration. 2.   Small fat containing left inguinal hernia. 3. Colonic diverticulosis with no acute diverticulitis. 4. Cholelithiasis no CT evidence of acute cholecystitis. 5. Right femoral head avascular necrosis.

## 2024-05-30 NOTE — Telephone Encounter (Signed)
 Pt called regarding Critical CT results called into this provider this morning.  CT renal stone study from 05/22/2024 with partially visualized right inguinal hernia containing the majority of the urinary bladder, small fat-containing left inguinal hernia, and right femoral head avascular necrosis.  Cholelithiasis without cholecystitis also noted.  Given findings patient advised to proceed to nearest ED.  Patient hesitant as cares for his wife and is currently at work.  Reiterated r/b/a of waiting to have treatment.  States will go to the emergency room but needs to make arrangements for his wife as he is her caregiver.  Pt states he has good news.  He has not had a drink since the OFV on 7/10.  States he has more energy, feels great.  States does not have a desire for alcohol.  Clotilda Single, MD

## 2024-05-31 ENCOUNTER — Emergency Department (HOSPITAL_COMMUNITY)
Admission: EM | Admit: 2024-05-31 | Discharge: 2024-05-31 | Disposition: A | Discharge status: Home / Self Care | Payer: Medicare (Managed Care)

## 2024-05-31 ENCOUNTER — Emergency Department (HOSPITAL_COMMUNITY): Payer: Medicare (Managed Care)

## 2024-05-31 ENCOUNTER — Other Ambulatory Visit: Payer: Self-pay

## 2024-05-31 DIAGNOSIS — R109 Unspecified abdominal pain: Secondary | ICD-10-CM | POA: Diagnosis present

## 2024-05-31 DIAGNOSIS — E119 Type 2 diabetes mellitus without complications: Secondary | ICD-10-CM | POA: Insufficient documentation

## 2024-05-31 DIAGNOSIS — K4090 Unilateral inguinal hernia, without obstruction or gangrene, not specified as recurrent: Secondary | ICD-10-CM | POA: Diagnosis not present

## 2024-05-31 LAB — COMPREHENSIVE METABOLIC PANEL WITH GFR
ALT: 28 U/L (ref 0–44)
AST: 24 U/L (ref 15–41)
Albumin: 3.2 g/dL — ABNORMAL LOW (ref 3.5–5.0)
Alkaline Phosphatase: 119 U/L (ref 38–126)
Anion gap: 10 (ref 5–15)
BUN: 37 mg/dL — ABNORMAL HIGH (ref 8–23)
CO2: 22 mmol/L (ref 22–32)
Calcium: 9.4 mg/dL (ref 8.9–10.3)
Chloride: 103 mmol/L (ref 98–111)
Creatinine, Ser: 1.86 mg/dL — ABNORMAL HIGH (ref 0.61–1.24)
GFR, Estimated: 37 mL/min — ABNORMAL LOW (ref >60)
Glucose, Bld: 145 mg/dL — ABNORMAL HIGH (ref 70–99)
Potassium: 4.2 mmol/L (ref 3.5–5.1)
Sodium: 135 mmol/L (ref 135–145)
Total Bilirubin: 0.7 mg/dL (ref 0.0–1.2)
Total Protein: 7.7 g/dL (ref 6.5–8.1)

## 2024-05-31 LAB — CBC WITH DIFFERENTIAL/PLATELET
Abs Immature Granulocytes: 0.02 K/uL (ref 0.00–0.07)
Basophils Absolute: 0 K/uL (ref 0.0–0.1)
Basophils Relative: 1 %
Eosinophils Absolute: 0.1 K/uL (ref 0.0–0.5)
Eosinophils Relative: 1 %
HCT: 42.2 % (ref 39.0–52.0)
Hemoglobin: 14 g/dL (ref 13.0–17.0)
Immature Granulocytes: 0 %
Lymphocytes Relative: 21 %
Lymphs Abs: 1.4 K/uL (ref 0.7–4.0)
MCH: 32.6 pg (ref 26.0–34.0)
MCHC: 33.2 g/dL (ref 30.0–36.0)
MCV: 98.1 fL (ref 80.0–100.0)
Monocytes Absolute: 0.5 K/uL (ref 0.1–1.0)
Monocytes Relative: 8 %
Neutro Abs: 4.4 K/uL (ref 1.7–7.7)
Neutrophils Relative %: 69 %
Platelets: 199 K/uL (ref 150–400)
RBC: 4.3 MIL/uL (ref 4.22–5.81)
RDW: 13.9 % (ref 11.5–15.5)
WBC: 6.4 K/uL (ref 4.0–10.5)
nRBC: 0 % (ref 0.0–0.2)

## 2024-05-31 LAB — URINALYSIS, ROUTINE W REFLEX MICROSCOPIC
Bacteria, UA: NONE SEEN
Bilirubin Urine: NEGATIVE
Glucose, UA: >=500 mg/dL — AB
Hgb urine dipstick: NEGATIVE
Ketones, ur: NEGATIVE mg/dL
Leukocytes,Ua: NEGATIVE
Nitrite: NEGATIVE
Protein, ur: NEGATIVE mg/dL
Specific Gravity, Urine: 1.008 (ref 1.005–1.030)
pH: 6 (ref 5.0–8.0)

## 2024-05-31 LAB — LIPASE, BLOOD: Lipase: 33 U/L (ref 11–51)

## 2024-05-31 MED ORDER — IOHEXOL 300 MG/ML  SOLN
75.0000 mL | Freq: Once | INTRAMUSCULAR | Status: AC | PRN
  Administered 2024-05-31: 75 mL via INTRAVENOUS

## 2024-05-31 MED ORDER — IOHEXOL 300 MG/ML  SOLN: 100.0000 mL | Freq: Once | INTRAMUSCULAR | Status: DC | PRN

## 2024-05-31 NOTE — Discharge Instructions (Addendum)
 We discussed, you do have a large hernia.  I spoke to the surgeon on-call today who did not think you need to be fixed immediately.  Please give them a call on Monday to establish care.  They will need to fix this outpatient.  If you ever get to the point where you are not able have a bowel or bladder movement or if you have any creasing pain or any kind of fever or chills then please come back to the ED for further evaluation.

## 2024-05-31 NOTE — ED Triage Notes (Signed)
 Patient got MRI done on 7/17, Dr. Florence and let him know he has a hernias on the side on groin area. NAD, No other complaints.Denies any Cp, SHOB, Pain 5/10. Pt diabetic.

## 2024-05-31 NOTE — ED Provider Notes (Signed)
 [place] EM[facility]GENC[facility] DEPARTMENT AT [facility] Provider Note   CSN: [id number] Arrival date & time: 05/31/24  9291     Patient presents with: Hernia   [name] is a 78 y.o. male.   HPI  States he came into the ED today because of results from a recent CT scan.  Patient states that has been having more chronic abdominal pain for the past 3 to 4 months right lower side.  Patient states he is able to urinate.  He has no difficulty with urinating right now that he endorses.  Patient denies any nausea vomiting diarrhea.  Bowel moods have been regular.  Still passing flatulence.  No fever no chills.  Otherwise feels fine.  He states he has been having chronic abdominal pain in his right lower side.     Previous medical history reviewed : Patient followed up with PCP on May 15, 2024.  CT scan was ordered at that time.  CT he scan showed partially visualized right groin hernia containing the majority urinary bladder.  She did urinary bladder wall thickening likely due to inflammatory/reactive changes.  Small fat-containing left inguinal hernia.   Prior to Admission medications   Medication Sig Start Date End Date Taking? Authorizing Provider  allopurinol  (ZYLOPRIM ) 100 MG tablet Take 1 tablet (100 mg total) by mouth daily. 03/17/24   Mercer Clotilda SAUNDERS, MD  amLODipine  (NORVASC ) 10 MG tablet Take 1 tablet (10 mg total) by mouth daily. 04/21/24   Mercer Clotilda SAUNDERS, MD  atorvastatin  (LIPITOR ) 80 MG tablet TAKE 1 TABLET BY MOUTH EVERY DAY IN THE MORNING 11/28/23   Mercer Clotilda SAUNDERS, MD  colchicine  0.6 MG tablet TAKE 2 TABS (1.2MG ) AT FIRST SIGN OF GOUT FLARE. THEN TAKE 1 TAB 1 HOUR LATER. ON DAY 2 TAKE ONE TAB DAILY UNTIL FLARE STOPS. 03/26/24   Mercer Clotilda SAUNDERS, MD  Continuous Blood Gluc Receiver (FREESTYLE LIBRE 14 DAY READER) DEVI Use as directed 03/08/22   Mercer Clotilda SAUNDERS, MD  Continuous Blood Gluc Receiver (FREESTYLE LIBRE READER) DEVI Use with Freestyle Sensor to check blood sugar  levels. 04/07/22   Mercer Clotilda SAUNDERS, MD  dapagliflozin propanediol (FARXIGA) 10 MG TABS tablet Take 10 mg by mouth daily.    [provider]  gabapentin  (NEURONTIN ) 100 MG capsule TAKE 1 CAPSULE (100 MG TOTAL) BY MOUTH AT BEDTIME AS NEEDED (PAIN). 06/14/23   Mercer Clotilda SAUNDERS, MD  glucose blood (ONE TOUCH ULTRA TEST) test strip 1 each by Other route daily. Use as instructed 08/29/12   Jame Maude FALCON, MD  Lancets [facility] ULTRASOFT) lancets 1 each by Other route daily. Use as instructed 08/29/12   Kwiatkowski, Peter F, MD  LOKELMA 10 g PACK packet Take 1 packet by mouth daily. Patient not taking: Reported on 05/15/2024 10/19/21   [provider]  nitroGLYCERIN  (NITROSTAT ) 0.4 MG SL tablet Place 1 tablet (0.4 mg total) under the tongue every 5 (five) minutes as needed for chest pain. 09/06/20   Mercer Clotilda SAUNDERS, MD  sildenafil  (VIAGRA ) 25 MG tablet Take 1 tablet (25 mg total) by mouth daily as needed for erectile dysfunction. 05/19/21   Mercer Clotilda SAUNDERS, MD  traMADol  (ULTRAM ) 50 MG tablet TAKE 2 TABLETS BY MOUTH EVERY 12 HOURS AS NEEDED 01/28/24   Mercer Clotilda SAUNDERS, MD    Allergies: Patient has no known allergies.    Review of Systems  Constitutional:  Negative for chills and fever.  HENT:  Negative for ear pain and sore throat.  Eyes:  Negative for pain and visual disturbance.  Respiratory:  Negative for cough and shortness of breath.   Cardiovascular:  Negative for chest pain and palpitations.  Gastrointestinal:  Negative for abdominal pain and vomiting.  Genitourinary:  Negative for dysuria and hematuria.  Musculoskeletal:  Negative for arthralgias and back pain.  Skin:  Negative for color change and rash.  Neurological:  Negative for seizures and syncope.  All other systems reviewed and are negative.   Updated Vital Signs BP 129/82   Pulse 67   Temp (!) 97.5 F (36.4 C)   Resp 18   Ht 5' 11 (1.803 m)   Wt 119.3 kg   SpO2 98%   BMI 36.68 kg/m   Physical  Exam Vitals and nursing note reviewed.  Constitutional:      General: He is not in acute distress.    Appearance: He is well-developed.  HENT:     Head: Normocephalic and atraumatic.  Eyes:     Conjunctiva/sclera: Conjunctivae normal.  Cardiovascular:     Rate and Rhythm: Normal rate and regular rhythm.     Heart sounds: No murmur heard. Pulmonary:     Effort: Pulmonary effort is normal. No respiratory distress.     Breath sounds: Normal breath sounds.  Abdominal:     Palpations: Abdomen is soft.     Tenderness: There is no abdominal tenderness.   Musculoskeletal:        General: No swelling.     Cervical back: Neck supple.  Skin:    General: Skin is warm and dry.     Capillary Refill: Capillary refill takes less than 2 seconds.  Neurological:     Mental Status: He is alert.  Psychiatric:        Mood and Affect: Mood normal.     (all labs ordered are listed, but only abnormal results are displayed) Labs Reviewed  COMPREHENSIVE METABOLIC PANEL WITH GFR - Abnormal; Notable for the following components:      Result Value   Glucose, Bld 145 (*)    BUN 37 (*)    Creatinine, Ser 1.86 (*)    Albumin 3.2 (*)    GFR, Estimated 37 (*)    All other components within normal limits  URINALYSIS, ROUTINE W REFLEX MICROSCOPIC - Abnormal; Notable for the following components:   Color, Urine STRAW (*)    Glucose, UA >=500 (*)    All other components within normal limits  CBC WITH DIFFERENTIAL/PLATELET  LIPASE, BLOOD    EKG: None  Radiology: CT ABDOMEN PELVIS W CONTRAST Result Date: 05/31/2024 CLINICAL DATA:  Inguinal hernia.  Right-sided abdominal pain. EXAM: CT ABDOMEN AND PELVIS WITH CONTRAST TECHNIQUE: Multidetector CT imaging of the abdomen and pelvis was performed using the standard protocol following bolus administration of intravenous contrast. RADIATION DOSE REDUCTION: This exam was performed according to the departmental dose-optimization program which includes automated  exposure control, adjustment of the mA and/or kV according to patient size and/or use of iterative reconstruction technique. CONTRAST:  75mL OMNIPAQUE  IOHEXOL  300 MG/ML  SOLN COMPARISON:  05/22/2024 FINDINGS: Lower Chest: No acute findings. Hepatobiliary: No suspicious hepatic masses identified. Gallstones are seen, but without signs of cholecystitis or biliary dilatation. Pancreas:  No mass or inflammatory changes. Spleen: Within normal limits in size and appearance. Adrenals/Urinary Tract: No suspicious masses identified. No evidence of ureteral calculi or hydronephrosis. A moderate right-sided inguinal hernia is seen which contains a significant portion of the urinary bladder. Stomach/Bowel: No evidence of obstruction, inflammatory  process or abnormal fluid collections. Diverticulosis is seen mainly involving the sigmoid colon, however there is no evidence of diverticulitis. No herniated bowel loops. Vascular/Lymphatic: No pathologically enlarged lymph nodes. No acute vascular findings. Reproductive:  No mass or other significant abnormality. Other:  None. Musculoskeletal:  No suspicious bone lesions identified. IMPRESSION: Moderate right inguinal hernia, which contains a significant portion of the urinary bladder. Colonic diverticulosis, without radiographic evidence of diverticulitis. Cholelithiasis. No radiographic evidence of cholecystitis. Electronically Signed   By: Norleen DELENA Kil M.D.   On: 05/31/2024 10:16     Procedures   Medications Ordered in the ED  iohexol  (OMNIPAQUE ) 300 MG/ML solution 75 mL (75 mLs Intravenous Contrast Given 05/31/24 9072)                                    Medical Decision Making Amount and/or Complexity of Data Reviewed Labs: ordered. Radiology: ordered.  Risk Prescription drug management.   States he came into the ED today because of results from a recent CT scan.  Patient states that has been having more chronic abdominal pain for the past 3 to 4 months right  lower side.  Patient states he is able to urinate.  He has no difficulty with urinating right now that he endorses.  Patient denies any nausea vomiting diarrhea.  Bowel moods have been regular.  Still passing flatulence.  No fever no chills.  Otherwise feels fine.  He states he has been having chronic abdominal pain in his right lower side.   Previous medical history reviewed : Patient followed up with PCP on May 15, 2024.  CT scan was ordered at that time.  CT he scan showed partially visualized right groin hernia containing the majority urinary bladder.  She did urinary bladder wall thickening likely due to inflammatory/reactive changes.  Small fat-containing left inguinal hernia.    Upon exam, patient hemodynamically stable.  ANO x 3 with GCS of 15.   Patient has some pain with palpation the right lower quadrant.  No rebound or guarding.  Could palpate hernia but this was reducible.  No skin breakdown.  No evidence of ischemia.  Bowel movements have been normal.  I did obtain CT scan with venous contrast given prior imaging was renal study.  Did show moderate size right inguinal hernia with a large amount of patient's bladder inside this hernia.  Patient still able to urinate.  Spoke to general surgery about this patient, Dr. Kallie.  Recommended outpatient follow-up.  No immediate admission needed.  I am in agreement with this given patient's hemodynamic stability, laboratory workup being benign, no evidence of ischemia.  Explained to the patient that if he starts having worsening abdominal pain, any fever or chills, difficulty having bowel movement or voiding bladder then please go back to the ED.   Creatinine 1.86 which is around patient's baseline.        Final diagnoses:  Non-recurrent unilateral inguinal hernia without obstruction or gangrene    ED Discharge Orders     None          Simon Lavonia SAILOR, MD 05/31/24 1119

## 2024-06-02 ENCOUNTER — Telehealth: Payer: Self-pay | Admitting: Family Medicine

## 2024-06-02 DIAGNOSIS — K4090 Unilateral inguinal hernia, without obstruction or gangrene, not specified as recurrent: Secondary | ICD-10-CM

## 2024-06-02 DIAGNOSIS — K8020 Calculus of gallbladder without cholecystitis without obstruction: Secondary | ICD-10-CM

## 2024-06-02 NOTE — Telephone Encounter (Signed)
 Patient contacted regarding recent ED visit and need for follow-up with surgery given right inguinal hernia containing majority of the urinary bladder and small left inguinal hernia. Appointment made with [facility] surgery on 06/04/2024 at 3:10 PM with a 2:40 PM arrival time.  Patient agrees to appointment.  Given address and contact info.  Clotilda Single, MD

## 2024-06-04 ENCOUNTER — Ambulatory Visit: Payer: Self-pay | Admitting: General Surgery

## 2024-06-04 ENCOUNTER — Telehealth: Payer: Self-pay

## 2024-06-04 DIAGNOSIS — K4090 Unilateral inguinal hernia, without obstruction or gangrene, not specified as recurrent: Secondary | ICD-10-CM | POA: Diagnosis not present

## 2024-06-04 NOTE — Telephone Encounter (Signed)
   Pre-operative Risk Assessment    Patient Name: [name]  DOB: 14-Nov-1945 MRN: [id number]   Date of last office visit: 10/23/23 DORN LESCHES, MD Date of next office visit: NONE   Request for Surgical Clearance    Procedure:  HERNIA SURGERY  Date of Surgery:  Clearance TBD                                Surgeon:  LYNDA LEOS, MD Surgeon's Group or Practice Name:  [facility] Phone number:  [phone] Fax number:  [phone]   ATTN: ROSALINE SPRANG, CMA   Type of Clearance Requested:   - Medical    Type of Anesthesia:  General    Additional requests/questions:    Signed, Lucie DELENA Ku   06/04/2024, 5:19 PM

## 2024-06-04 NOTE — H&P (Signed)
 Chief Complaint: New Consultation (R inguinal hernia, CT done)       History of Present Illness: [name] is a 78 y.o. male who is seen today as an office consultation at the request of Dr. Mercer for evaluation of New Consultation (R inguinal hernia, CT done) .   History of Present Illness [name] is a 78 year old male who presents with a right-sided hernia. He was referred by Dr. Paola for evaluation of a hernia.   He experiences a nagging sensation from the hernia, with a noticeable lump between the side and groin area. There are no issues with urination, feeling of incomplete bladder emptying, or changes in hernia size with bladder fullness. During a previous emergency room visit, pressure applied to the right side caused pain.   His past surgical history includes a bleeding ulcer repair in the 1970s with a large incision and removal of an umbilical cyst, which may contribute to internal scar tissue.   He takes a baby aspirin  daily and does not take Plavix  or Eliquis. He has a history of heart issues and is under the care of Dr. Court.       Review of Systems: A complete review of systems was obtained from the patient.  I have reviewed this information and discussed as appropriate with the patient.  See HPI as well for other ROS.   Review of Systems  Constitutional:  Negative for fever.  HENT:  Negative for congestion.   Eyes:  Negative for blurred vision.  Respiratory:  Negative for cough, shortness of breath and wheezing.   Cardiovascular:  Negative for chest pain and palpitations.  Gastrointestinal:  Negative for heartburn.  Genitourinary:  Negative for dysuria.  Musculoskeletal:  Negative for myalgias.  Skin:  Negative for rash.  Neurological:  Negative for dizziness and headaches.  Psychiatric/Behavioral:  Negative for depression and suicidal ideas.   All other systems reviewed and are negative.       Medical History: Past Medical History Past  Medical History: Diagnosis Date  Chronic kidney disease    Liver disease         Problem List There is no problem list on file for this patient.     Past Surgical History Past Surgical History: Procedure Laterality Date  .triple bypass          Allergies No Known Allergies    Medications Ordered Prior to Encounter Current Outpatient Medications on File Prior to Visit Medication Sig Dispense Refill  allopurinoL  (ZYLOPRIM ) 100 MG tablet Take 100 mg by mouth once daily      amLODIPine  (NORVASC ) 10 MG tablet Take 10 mg by mouth once daily      atorvastatin  (LIPITOR ) 80 MG tablet Take 80 mg by mouth every morning      colchicine  (COLCRYS ) 0.6 mg tablet TAKE 2 TABS (1.2MG ) AT FIRST SIGN OF GOUT FLARE. THEN TAKE 1 TAB 1 HOUR LATER. ON DAY 2 TAKE ONE TAB DAILY UNTIL FLARE STOPS.      traMADoL  (ULTRAM ) 50 mg tablet TAKE 2 TABLETS BY MOUTH EVERY 12 HOURS AS NEEDED        No current facility-administered medications on file prior to visit.      Family History Family History Problem Relation Age of Onset  High blood pressure (Hypertension) Sister    Hyperlipidemia (Elevated cholesterol) Sister    Diabetes Sister        Tobacco Use History Social History    Tobacco Use Smoking Status  Former  Types: Cigarettes Smokeless Tobacco Never      Social History Social History    Socioeconomic History  Marital status: Married Tobacco Use  Smoking status: Former     Types: Cigarettes  Smokeless tobacco: Never Vaping Use  Vaping status: Unknown Substance and Sexual Activity  Alcohol use: Never  Drug use: Never    Social Drivers of Manufacturing engineer Strain: Low Risk  (07/14/2022)   Received from [facility]   Overall Financial Resource Strain (CARDIA)    Difficulty of Paying Living Expenses: Not hard at all Food Insecurity: No Food Insecurity (07/14/2022)   Received from [facility]   Hunger Vital Sign    Within the past 12 months, you worried that  your food would run out before you got the money to buy more.: Never true    Within the past 12 months, the food you bought just didn't last and you didn't have money to get more.: Never true Transportation Needs: No Transportation Needs (07/14/2022)   Received from [facility] - Transportation    Lack of Transportation (Medical): No    Lack of Transportation (Non-Medical): No Physical Activity: Insufficiently Active (07/14/2022)   Received from [facility]   Exercise Vital Sign    On average, how many days per week do you engage in moderate to strenuous exercise (like a brisk walk)?: 3 days    On average, how many minutes do you engage in exercise at this level?: 20 min Stress: No Stress Concern Present (07/14/2022)   Received from [facility] - Occupational Stress Questionnaire    Feeling of Stress : Not at all Social Connections: Socially Integrated (07/14/2022)   Received from [facility]   Social Connection and Isolation Panel    In a typical week, how many times do you talk on the phone with family, friends, or neighbors?: More than three times a week    How often do you get together with friends or relatives?: More than three times a week    How often do you attend church or religious services?: More than 4 times per year    Do you belong to any clubs or organizations such as church groups, unions, fraternal or athletic groups, or school groups?: Yes    How often do you attend meetings of the clubs or organizations you belong to?: More than 4 times per year    Are you married, widowed, divorced, separated, never married, or living with a partner?: Married Housing Stability: Unknown (06/04/2024)   Housing Stability Vital Sign    Homeless in the Last Year: No      Objective:     Vitals:   06/04/24 1512 06/04/24 1515 BP: 127/73   Resp: 18   Temp: 36.8 C (98.3 F)   SpO2: 98%   Weight: (!) 120.4 kg (265 lb 6.4 oz)    Height: 180.3 cm (5' 11)   PainSc:   0-No pain PainLoc:   Abdomen   Body mass index is 37.02 kg/m. Physical Exam Constitutional:      Appearance: Normal appearance.  HENT:     Head: Normocephalic and atraumatic.     Nose: Nose normal. No congestion.     Mouth/Throat:     Mouth: Mucous membranes are moist.     Pharynx: Oropharynx is clear.  Eyes:     Pupils: Pupils are equal, round, and reactive to light.  Cardiovascular:  Rate and Rhythm: Normal rate and regular rhythm.     Pulses: Normal pulses.     Heart sounds: Normal heart sounds. No murmur heard.    No friction rub. No gallop.  Pulmonary:     Effort: Pulmonary effort is normal. No respiratory distress.     Breath sounds: Normal breath sounds. No stridor. No wheezing, rhonchi or rales.  Abdominal:     General: Abdomen is flat.     Hernia: A hernia is present. Hernia is present in the left inguinal area and right inguinal area.  Musculoskeletal:        General: Normal range of motion.     Cervical back: Normal range of motion.  Skin:    General: Skin is warm and dry.  Neurological:     General: No focal deficit present.     Mental Status: He is alert and oriented to person, place, and time.  Psychiatric:        Mood and Affect: Mood normal.        Thought Content: Thought content normal.         Assessment and Plan: Diagnoses and all orders for this visit:   Unilateral inguinal hernia without obstruction or gangrene, recurrence not specified     [name] is a 78 y.o. male    1.  We will proceed to the OR for a lap possible open RIGHT inguinal hernia repair with mesh. 2. All risks and benefits were discussed with the patient, to generally include infection, bleeding, damage to surrounding structures, acute and chronic nerve pain, and recurrence. Alternatives were offered and described.  All questions were answered and the patient voiced understanding of the procedure and wishes to proceed at this  point.             No follow-ups on file.   Lynda Leos, MD, [facility], [place] [facility] Minimally Invasive Surgery

## 2024-06-05 NOTE — Telephone Encounter (Signed)
 Left a message for patient to call back to have pre-op clearance.

## 2024-06-05 NOTE — Telephone Encounter (Signed)
   Name: [name]  DOB: 03/27/1946  MRN: [id number]  Primary Cardiologist: None   Preoperative team, please contact this patient and set up a phone call appointment for further preoperative risk assessment. Please obtain consent and complete medication review. Thank you for your help.  I confirm that guidance regarding antiplatelet and oral anticoagulation therapy has been completed and, if necessary, noted below.  None requested.  I also confirmed the patient resides in the state of [place] . As per [facility] Medical Board telemedicine laws, the patient must reside in the state in which the provider is licensed.   Josefa CHRISTELLA Beauvais, NP 06/05/2024, 8:37 AM [facility]

## 2024-06-06 ENCOUNTER — Telehealth: Payer: Self-pay

## 2024-06-06 NOTE — Telephone Encounter (Signed)
 Pt returning nurse's call regarding clearance.

## 2024-06-06 NOTE — Telephone Encounter (Signed)
 S/W pt and scheduled TELE Preop Appt. 06/11/24.  Med Rec and Consent   Will update surgeons office

## 2024-06-06 NOTE — Telephone Encounter (Signed)
 Med Rec and Consent     Patient Consent for Virtual Visit        [name] has provided verbal consent on 06/06/2024 for a virtual visit (video or telephone).   CONSENT FOR VIRTUAL VISIT FOR:  [name]  By participating in this virtual visit I agree to the following:  I hereby voluntarily request, consent and authorize [facility] and its employed or contracted physicians, physician assistants, nurse practitioners or other licensed health care professionals (the Practitioner), to provide me with telemedicine health care services (the "Services) as deemed necessary by the treating Practitioner. I acknowledge and consent to receive the Services by the Practitioner via telemedicine. I understand that the telemedicine visit will involve communicating with the Practitioner through live audiovisual communication technology and the disclosure of certain medical information by electronic transmission. I acknowledge that I have been given the opportunity to request an in-person assessment or other available alternative prior to the telemedicine visit and am voluntarily participating in the telemedicine visit.  I understand that I have the right to withhold or withdraw my consent to the use of telemedicine in the course of my care at any time, without affecting my right to future care or treatment, and that the Practitioner or I may terminate the telemedicine visit at any time. I understand that I have the right to inspect all information obtained and/or recorded in the course of the telemedicine visit and may receive copies of available information for a reasonable fee.  I understand that some of the potential risks of receiving the Services via telemedicine include:  Delay or interruption in medical evaluation due to technological equipment failure or disruption; Information transmitted may not be sufficient (e.g. poor resolution of images) to allow for appropriate medical decision  making by the Practitioner; and/or  In rare instances, security protocols could fail, causing a breach of personal health information.  Furthermore, I acknowledge that it is my responsibility to provide information about my medical history, conditions and care that is complete and accurate to the best of my ability. I acknowledge that Practitioner's advice, recommendations, and/or decision may be based on factors not within their control, such as incomplete or inaccurate data provided by me or distortions of diagnostic images or specimens that may result from electronic transmissions. I understand that the practice of medicine is not an exact science and that Practitioner makes no warranties or guarantees regarding treatment outcomes. I acknowledge that a copy of this consent can be made available to me via my patient portal [facility]), or I can request a printed copy by calling the office of [facility].    I understand that my insurance will be billed for this visit.   I have read or had this consent read to me. I understand the contents of this consent, which adequately explains the benefits and risks of the Services being provided via telemedicine.  I have been provided ample opportunity to ask questions regarding this consent and the Services and have had my questions answered to my satisfaction. I give my informed consent for the services to be provided through the use of telemedicine in my medical care

## 2024-06-09 ENCOUNTER — Other Ambulatory Visit: Payer: Self-pay | Admitting: Family Medicine

## 2024-06-09 DIAGNOSIS — M4726 Other spondylosis with radiculopathy, lumbar region: Secondary | ICD-10-CM

## 2024-06-09 MED ORDER — TRAMADOL HCL 50 MG PO TABS: ORAL_TABLET | ORAL | 1 refills | Status: DC

## 2024-06-09 NOTE — Telephone Encounter (Signed)
 Copied from CRM [id number]. Topic: Clinical - Medication Refill >> Jun 09, 2024  9:35 AM Viola F wrote: Medication: traMADol  (ULTRAM ) 50 MG tablet [[id number]]  Has the patient contacted their pharmacy? Yes (Agent: If no, request that the patient contact the pharmacy for the refill. If patient does not wish to contact the pharmacy document the reason why and proceed with request.) (Agent: If yes, when and what did the pharmacy advise?)  This is the patient's preferred pharmacy:  [id number] [place] - [address] [place] [place] [address] Phone: [phone] Fax: [phone]    Is this the correct pharmacy for this prescription? Yes If no, delete pharmacy and type the correct one.   Has the prescription been filled recently? Yes  Is the patient out of the medication? Yes  Has the patient been seen for an appointment in the last year OR does the patient have an upcoming appointment? Yes  Can we respond through MyChart? Yes  Agent: Please be advised that Rx refills may take up to 3 business days. We ask that you follow-up with your pharmacy.

## 2024-06-11 ENCOUNTER — Ambulatory Visit: Payer: Medicare (Managed Care) | Attending: Cardiology

## 2024-06-11 DIAGNOSIS — E875 Hyperkalemia: Secondary | ICD-10-CM | POA: Diagnosis not present

## 2024-06-11 DIAGNOSIS — N189 Chronic kidney disease, unspecified: Secondary | ICD-10-CM | POA: Diagnosis not present

## 2024-06-11 DIAGNOSIS — E1122 Type 2 diabetes mellitus with diabetic chronic kidney disease: Secondary | ICD-10-CM | POA: Diagnosis not present

## 2024-06-11 DIAGNOSIS — N183 Chronic kidney disease, stage 3 unspecified: Secondary | ICD-10-CM | POA: Diagnosis not present

## 2024-06-11 DIAGNOSIS — I129 Hypertensive chronic kidney disease with stage 1 through stage 4 chronic kidney disease, or unspecified chronic kidney disease: Secondary | ICD-10-CM | POA: Diagnosis not present

## 2024-06-11 DIAGNOSIS — K4090 Unilateral inguinal hernia, without obstruction or gangrene, not specified as recurrent: Secondary | ICD-10-CM | POA: Diagnosis not present

## 2024-06-11 DIAGNOSIS — D631 Anemia in chronic kidney disease: Secondary | ICD-10-CM | POA: Diagnosis not present

## 2024-06-11 DIAGNOSIS — Z01810 Encounter for preprocedural cardiovascular examination: Secondary | ICD-10-CM | POA: Diagnosis not present

## 2024-06-11 DIAGNOSIS — D472 Monoclonal gammopathy: Secondary | ICD-10-CM | POA: Diagnosis not present

## 2024-06-11 DIAGNOSIS — N2581 Secondary hyperparathyroidism of renal origin: Secondary | ICD-10-CM | POA: Diagnosis not present

## 2024-06-11 NOTE — Progress Notes (Signed)
 Virtual Visit via Telephone Note   Because of [name] co-morbid illnesses, he is at least at moderate risk for complications without adequate follow up.  This format is felt to be most appropriate for this patient at this time.  Due to technical limitations with video connection (technology), today's appointment will be conducted as an audio only telehealth visit, and [name] verbally agreed to proceed in this manner.   All issues noted in this document were discussed and addressed.  No physical exam could be performed with this format.  Evaluation Performed:  Preoperative cardiovascular risk assessment _____________   Date:  06/11/2024   Patient ID:  [name], DOB 12-Dec-1945, MRN [id number] Patient Location:  Home Provider location:   Office  Primary Care Provider:  Mercer Clotilda SAUNDERS, MD Primary Cardiologist:  None  Chief Complaint / Patient Profile   78 y.o. y/o male with a h/o dyslipidemia, hypertension, CAD status post three-vessel CABG, aortic stenosis, grade 1 DD who is pending hernia surgery and presents today for telephonic preoperative cardiovascular risk assessment.  History of Present Illness    [name] is a 78 y.o. male who presents via audio/video conferencing for a telehealth visit today.  Pt was last seen in cardiology clinic on 10/23/2023 by Dr. Court.  At that time [name] was doing well .  The patient is now pending procedure as outlined above. Since his last visit, he has been doing well from a heart standpoint. No issues with chest pain or SOB.   He may hold the Asprin 81mg  daily for 5-7 days prior to surgery and restart when medically safe to do so.   Past Medical History    Past Medical History:  Diagnosis Date   CAD, ARTERY BYPASS GRAFT 02/10/2008   CAROTID ARTERY STENOSIS, WITHOUT INFARCTION 09/22/2008   CORONARY ARTERY DISEASE 04/03/2008   Diabetes mellitus    borderline   ERECTILE DYSFUNCTION 01/14/2009   GOUT  01/16/2008   HERPES ZOSTER 04/15/2010   HYPERLIPIDEMIA 01/16/2008   HYPERTENSION 01/16/2008   IMPAIRED GLUCOSE TOLERANCE 08/22/2010   Shingles 2011   left chest   Ulcer 30 yrs ago   stomach   Past Surgical History:  Procedure Laterality Date   COLONOSCOPY     3 months ago   CORONARY ARTERY BYPASS GRAFT  2009   vessels x3   EAR CYST EXCISION N/A 07/29/2013   Procedure: excision of urachal cyst flexible cystoscopy insertion of foley cath;  Surgeon: Donnice Gwenyth Brooks, MD;  Location: [facility];  Service: Urology;  Laterality: N/A;   LAPAROSCOPY N/A 07/29/2013   Procedure: LAPAROSCOPY DIAGNOSTIC  laparoscopic excision of urachal cyst ;  Surgeon: Redell Faith, DO;  Location: [facility];  Service: General;  Laterality: N/A;   LAPAROTOMY  08/25/2012   Procedure: EXPLORATORY LAPAROTOMY;  Surgeon: Redell Faith, DO;  Location: [facility];  Service: General;  Laterality: N/A;  incision and drainage abdominal wall abcessand intra abdominal wall abcess   PARTIAL GASTRECTOMY  1974   bleeding ulcers   urachal  2014   Urachal cyst removal    Allergies  No Known Allergies  Home Medications    Prior to Admission medications   Medication Sig Start Date End Date Taking? Authorizing Provider  allopurinol  (ZYLOPRIM ) 100 MG tablet Take 1 tablet (100 mg total) by mouth daily. 03/17/24   Mercer Clotilda SAUNDERS, MD  amLODipine  (NORVASC ) 10 MG tablet Take 1 tablet (10 mg total) by mouth daily. 04/21/24  Mercer Clotilda SAUNDERS, MD  atorvastatin  (LIPITOR ) 80 MG tablet TAKE 1 TABLET BY MOUTH EVERY DAY IN THE MORNING 11/28/23   Mercer Clotilda SAUNDERS, MD  colchicine  0.6 MG tablet TAKE 2 TABS (1.2MG ) AT FIRST SIGN OF GOUT FLARE. THEN TAKE 1 TAB 1 HOUR LATER. ON DAY 2 TAKE ONE TAB DAILY UNTIL FLARE STOPS. 03/26/24   Mercer Clotilda SAUNDERS, MD  Continuous Blood Gluc Receiver (FREESTYLE LIBRE 14 DAY READER) DEVI Use as directed 03/08/22   Mercer Clotilda SAUNDERS, MD  Continuous Blood Gluc Receiver (FREESTYLE LIBRE READER) DEVI Use with Freestyle Sensor to  check blood sugar levels. 04/07/22   Mercer Clotilda SAUNDERS, MD  dapagliflozin propanediol (FARXIGA) 10 MG TABS tablet Take 10 mg by mouth daily.    [provider]  gabapentin  (NEURONTIN ) 100 MG capsule TAKE 1 CAPSULE (100 MG TOTAL) BY MOUTH AT BEDTIME AS NEEDED (PAIN). 06/14/23   Mercer Clotilda SAUNDERS, MD  glucose blood (ONE TOUCH ULTRA TEST) test strip 1 each by Other route daily. Use as instructed 08/29/12   Jame Maude FALCON, MD  Lancets [facility] ULTRASOFT) lancets 1 each by Other route daily. Use as instructed 08/29/12   Kwiatkowski, Peter F, MD  LOKELMA 10 g PACK packet Take 1 packet by mouth daily. Patient not taking: Reported on 06/06/2024 10/19/21   [provider]  nitroGLYCERIN  (NITROSTAT ) 0.4 MG SL tablet Place 1 tablet (0.4 mg total) under the tongue every 5 (five) minutes as needed for chest pain. 09/06/20   Mercer Clotilda SAUNDERS, MD  sildenafil  (VIAGRA ) 25 MG tablet Take 1 tablet (25 mg total) by mouth daily as needed for erectile dysfunction. 05/19/21   Mercer Clotilda SAUNDERS, MD  traMADol  (ULTRAM ) 50 MG tablet TAKE 2 TABLETS BY MOUTH EVERY 12 HOURS AS NEEDED 06/09/24   Mercer Clotilda SAUNDERS, MD    Physical Exam    Vital Signs:  [name] does not have vital signs available for review today.  Given telephonic nature of communication, physical exam is limited. AAOx3. NAD. Normal affect.  Speech and respirations are unlabored.  Accessory Clinical Findings    None  Assessment & Plan    1.  Preoperative Cardiovascular Risk Assessment:  [name] perioperative risk of a major cardiac event is 6.6% according to the Revised Cardiac Risk Index (RCRI).  Therefore, he is at high risk for perioperative complications.   His functional capacity is good at 6.36 METs according to the Duke Activity Status Index (DASI). Recommendations: According to ACC/AHA guidelines, no further cardiovascular testing needed.  The patient may proceed to surgery at acceptable risk.   Antiplatelet and/or  Anticoagulation Recommendations: Aspirin  can be held for 5-7 days prior to his surgery.  Please resume Aspirin  post operatively when it is felt to be safe from a bleeding standpoint.   The patient was advised that if he develops new symptoms prior to surgery to contact our office to arrange for a follow-up visit, and he verbalized understanding.   A copy of this note will be routed to requesting surgeon.  Time:   Today, I have spent 5 minutes with the patient with telehealth technology discussing medical history, symptoms, and management plan.     Peter LOISE Fabry, PA-C  06/11/2024, 1:57 PM

## 2024-07-16 ENCOUNTER — Other Ambulatory Visit: Payer: Self-pay

## 2024-07-16 ENCOUNTER — Encounter (HOSPITAL_COMMUNITY): Payer: Self-pay | Admitting: General Surgery

## 2024-07-16 NOTE — Progress Notes (Signed)
 Anesthesia Chart Review:  78 year old male follows with cardiology for history of HTN, HLD, CAD s/p CABG 2009 with LIMA to the LAD, vein graft sequentially to an intermediate branch and obtuse marginal branch as well as the distal right coronary artery.  Nuclear stress 01/2017 showed medium sized, moderate severity partially reversible inferolateral perfusion defect.  Echo 10/2021 showed LVEF 60 to 65%, grade 1 DD, normal wall motion, normal RV, mild to moderate aortic stenosis with mean gradient 16 mmHg.  Last seen by Dr. Court on 10/23/2023, stable at that time, no changes to management.  Recommended recheck echocardiogram in December 2025.  Seen by Orren Fabry, PA-C on 06/11/2024 for preop evaluation.  Per note, [name] perioperative risk of a major cardiac event is 6.6% according to the Revised Cardiac Risk Index (RCRI).  Therefore, he is at high risk for perioperative complications.   His functional capacity is good at 6.36 METs according to the Duke Activity Status Index (DASI). Recommendations: According to ACC/AHA guidelines, no further cardiovascular testing needed.  The patient may proceed to surgery at acceptable risk.  Antiplatelet and/or Anticoagulation Recommendations: Aspirin  can be held for 5-7 days prior to his surgery.  Please resume Aspirin  post operatively when it is felt to be safe from a bleeding standpoint.  Other pertinent history includes non-insulin -dependent DM2 (A1c 6.8 on 09/06/2023), CKD 3, prior heavy EtOH use, MGUS followed by hematology.  CMP and CBC 05/31/2024 reviewed, creatinine elevated 1.86 consistent with history of CKD, mild hypoalbuminemia with albumin 3.2, otherwise unremarkable.  Patient will need day of surgery labs and evaluation.  EKG 10/23/2023: Sinus tachycardia.  Rate 101. Minimal voltage criteria for LVH, may be normal variant  TTE 10/23/2021: 1. Left ventricular ejection fraction, by estimation, is 60 to 65%. The  left ventricle has normal  function. The left ventricle has no regional  wall motion abnormalities. Left ventricular diastolic parameters are  consistent with Grade I diastolic  dysfunction (impaired relaxation).   2. Right ventricular systolic function is normal. The right ventricular  size is normal.   3. The mitral valve is normal in structure. No evidence of mitral valve  regurgitation. No evidence of mitral stenosis.   4. The aortic valve is tricuspid. There is moderate calcification of the  aortic valve. There is moderate thickening of the aortic valve. Aortic  valve regurgitation is not visualized. Mild to moderate aortic valve  stenosis. Aortic valve mean gradient  measures 16.0 mmHg. Aortic valve Vmax measures 3.04 m/s.   5. The inferior vena cava is normal in size with greater than 50%  respiratory variability, suggesting right atrial pressure of 3 mmHg.   Nuclear stress 01/09/2017:  The left ventricular ejection fraction is moderately decreased (30-44%). Nuclear stress EF: 44%. No T wave inversion was noted during stress. There was no ST segment deviation noted during stress. Defect 1: There is a medium defect of moderate severity. Findings consistent with prior myocardial infarction with peri-infarct ischemia. This is an intermediate risk study.   Medium size, moderate severity partially reversible inferolateral perfusion defect, suggestive of LCx territory scar with peri-infarct ischemia (SDS 4). LVEF 44% with severe inferolateral hypokinesis. This is an intermediate risk study.     Lynwood Geofm RIGGERS [facility] Short Stay Center/Anesthesiology Phone [phone] 07/16/2024 11:32 AM

## 2024-07-16 NOTE — Anesthesia Preprocedure Evaluation (Addendum)
 Anesthesia Evaluation  Patient identified by MRN, date of birth, ID band Patient awake    Reviewed: Allergy & Precautions, H&P , NPO status , Patient's Chart, lab work & pertinent test results  Airway Mallampati: II  TM Distance: >3 FB Neck ROM: Full    Dental no notable dental hx. (+) Edentulous Upper, Edentulous Lower,    Pulmonary neg pulmonary ROS, former smoker   Pulmonary exam normal breath sounds clear to auscultation       Cardiovascular Exercise Tolerance: Good hypertension, Pt. on medications + CAD and + Peripheral Vascular Disease  negative cardio ROS Normal cardiovascular exam Rhythm:Regular Rate:Normal     Neuro/Psych  PSYCHIATRIC DISORDERS      TIAnegative neurological ROS  negative psych ROS   GI/Hepatic negative GI ROS, Neg liver ROS,,,(+)     substance abuse  alcohol use  Endo/Other  negative endocrine ROSdiabetes, Well Controlled, Type 2    Renal/GU Renal diseasenegative Renal ROS  negative genitourinary   Musculoskeletal negative musculoskeletal ROS (+)    Abdominal   Peds negative pediatric ROS (+)  Hematology negative hematology ROS (+)   Anesthesia Other Findings   Reproductive/Obstetrics negative OB ROS                              Anesthesia Physical Anesthesia Plan  ASA: 4  Anesthesia Plan: General   Post-op Pain Management: Ofirmev  IV (intra-op)* and Toradol IV (intra-op)*   Induction: Intravenous  PONV Risk Score and Plan: 2 and Ondansetron  and TIVA  Airway Management Planned: Oral ETT  Additional Equipment: None  Intra-op Plan:   Post-operative Plan: Extubation in OR  Informed Consent: I have reviewed the patients History and Physical, chart, labs and discussed the procedure including the risks, benefits and alternatives for the proposed anesthesia with the patient or authorized representative who has indicated his/her understanding and  acceptance.       Plan Discussed with: Anesthesiologist and CRNA  Anesthesia Plan Comments: (PAT note by Lynwood Hope, PA-C: 78 year old male follows with cardiology for history of HTN, HLD, CAD s/p CABG 2009 with LIMA to the LAD, vein graft sequentially to an intermediate branch and obtuse marginal branch as well as the distal right coronary artery.  Nuclear stress 01/2017 showed medium sized, moderate severity partially reversible inferolateral perfusion defect.  Echo 10/2021 showed LVEF 60 to 65%, grade 1 DD, normal wall motion, normal RV, mild to moderate aortic stenosis with mean gradient 16 mmHg.  Last seen by Dr. Court on 10/23/2023, stable at that time, no changes to management.  Recommended recheck echocardiogram in December 2025.  Seen by Orren Fabry, PA-C on 06/11/2024 for preop evaluation.  Per note, Mr. Caswell perioperative risk of a major cardiac event is 6.6% according to the Revised Cardiac Risk Index (RCRI).  Therefore, he is at high risk for perioperative complications.   His functional capacity is good at 6.36 METs according to the Duke Activity Status Index (DASI). Recommendations: According to ACC/AHA guidelines, no further cardiovascular testing needed.  The patient may proceed to surgery at acceptable risk.  Antiplatelet and/or Anticoagulation Recommendations: Aspirin  can be held for 5-7 days prior to his surgery.  Please resume Aspirin  post operatively when it is felt to be safe from a bleeding standpoint.  Other pertinent history includes non-insulin -dependent DM2 (A1c 6.8 on 09/06/2023), CKD 3, prior heavy EtOH use, MGUS followed by hematology.  CMP and CBC 05/31/2024 reviewed, creatinine elevated 1.86 consistent with history  of CKD, mild hypoalbuminemia with albumin 3.2, otherwise unremarkable.  Patient will need day of surgery labs and evaluation.  EKG 10/23/2023: Sinus tachycardia.  Rate 101. Minimal voltage criteria for LVH, may be normal variant  TTE 10/23/2021: 1.  Left ventricular ejection fraction, by estimation, is 60 to 65%. The  left ventricle has normal function. The left ventricle has no regional  wall motion abnormalities. Left ventricular diastolic parameters are  consistent with Grade I diastolic  dysfunction (impaired relaxation).  2. Right ventricular systolic function is normal. The right ventricular  size is normal.  3. The mitral valve is normal in structure. No evidence of mitral valve  regurgitation. No evidence of mitral stenosis.  4. The aortic valve is tricuspid. There is moderate calcification of the  aortic valve. There is moderate thickening of the aortic valve. Aortic  valve regurgitation is not visualized. Mild to moderate aortic valve  stenosis. Aortic valve mean gradient  measures 16.0 mmHg. Aortic valve Vmax measures 3.04 m/s.  5. The inferior vena cava is normal in size with greater than 50%  respiratory variability, suggesting right atrial pressure of 3 mmHg.   Nuclear stress 01/09/2017:   The left ventricular ejection fraction is moderately decreased (30-44%).  Nuclear stress EF: 44%.  No T wave inversion was noted during stress.  There was no ST segment deviation noted during stress.  Defect 1: There is a medium defect of moderate severity.  Findings consistent with prior myocardial infarction with peri-infarct ischemia.  This is an intermediate risk study.   Medium size, moderate severity partially reversible inferolateral perfusion defect, suggestive of LCx territory scar with peri-infarct ischemia (SDS 4). LVEF 44% with severe inferolateral hypokinesis. This is an intermediate risk study.   )         Anesthesia Quick Evaluation

## 2024-07-16 NOTE — Progress Notes (Signed)
 PCP - Dr Clotilda Single Cardiologist - Dr Dorn Lesches Oncology - Dr Norleen Kidney IV  Chest x-ray - n/a EKG - 10/23/23 Stress Test - 01/09/17 ECHO - 10/23/23 Cardiac Cath - 11/07/07  ICD Pacemaker/Loop - n/a  Sleep Study -  n/a  Diabetes Type 2, diet controlled, no meds FreeStyle Libre Device, Sensor on left arm  Fasting Blood Sugar - 120s Checks Blood Sugar multiple times a day  Hold Farxiga 72 hours prior to procedure.  Last dose was on 06/13/24 per pt.    Aspirin  - last dose on 07/07/24 per pt.  Blood Thinner Instructions:  n/a  ERAS - clear liquids til 7 AM DOS.  Anesthesia review: Yes  STOP now taking any Aspirin  (unless otherwise instructed by your surgeon), Aleve, Naproxen, Ibuprofen, Motrin, Advil, Goody's, BC's, all herbal medications, fish oil, and all vitamins.   Coronavirus Screening Do you have any of the following symptoms:  Cough yes/no: No Fever (>100.80F)  yes/no: No Runny nose yes/no: No Sore throat yes/no: No Difficulty breathing/shortness of breath  yes/no: No  Have you traveled in the last 14 days and where? yes/no: No  Patient verbalized understanding of instructions that were given via phone.

## 2024-07-16 NOTE — H&P (Signed)
 Chief Complaint: New Consultation (R inguinal hernia, CT done)       History of Present Illness: [name] is a 78 y.o. male who is seen today as an office consultation at the request of Dr. Mercer for evaluation of New Consultation (R inguinal hernia, CT done) .   History of Present Illness [name] is a 78 year old male who presents with a right-sided hernia. He was referred by Dr. Paola for evaluation of a hernia.   He experiences a nagging sensation from the hernia, with a noticeable lump between the side and groin area. There are no issues with urination, feeling of incomplete bladder emptying, or changes in hernia size with bladder fullness. During a previous emergency room visit, pressure applied to the right side caused pain.   His past surgical history includes a bleeding ulcer repair in the 1970s with a large incision and removal of an umbilical cyst, which may contribute to internal scar tissue.   He takes a baby aspirin  daily and does not take Plavix  or Eliquis. He has a history of heart issues and is under the care of Dr. Court.       Review of Systems: A complete review of systems was obtained from the patient.  I have reviewed this information and discussed as appropriate with the patient.  See HPI as well for other ROS.   Review of Systems  Constitutional:  Negative for fever.  HENT:  Negative for congestion.   Eyes:  Negative for blurred vision.  Respiratory:  Negative for cough, shortness of breath and wheezing.   Cardiovascular:  Negative for chest pain and palpitations.  Gastrointestinal:  Negative for heartburn.  Genitourinary:  Negative for dysuria.  Musculoskeletal:  Negative for myalgias.  Skin:  Negative for rash.  Neurological:  Negative for dizziness and headaches.  Psychiatric/Behavioral:  Negative for depression and suicidal ideas.   All other systems reviewed and are negative.       Medical History: Past Medical History Past  Medical History: Diagnosis Date  Chronic kidney disease    Liver disease         Problem List There is no problem list on file for this patient.     Past Surgical History Past Surgical History: Procedure Laterality Date  .triple bypass          Allergies No Known Allergies    Medications Ordered Prior to Encounter Current Outpatient Medications on File Prior to Visit Medication Sig Dispense Refill  allopurinoL  (ZYLOPRIM ) 100 MG tablet Take 100 mg by mouth once daily      amLODIPine  (NORVASC ) 10 MG tablet Take 10 mg by mouth once daily      atorvastatin  (LIPITOR ) 80 MG tablet Take 80 mg by mouth every morning      colchicine  (COLCRYS ) 0.6 mg tablet TAKE 2 TABS (1.2MG ) AT FIRST SIGN OF GOUT FLARE. THEN TAKE 1 TAB 1 HOUR LATER. ON DAY 2 TAKE ONE TAB DAILY UNTIL FLARE STOPS.      traMADoL  (ULTRAM ) 50 mg tablet TAKE 2 TABLETS BY MOUTH EVERY 12 HOURS AS NEEDED        No current facility-administered medications on file prior to visit.      Family History Family History Problem Relation Age of Onset  High blood pressure (Hypertension) Sister    Hyperlipidemia (Elevated cholesterol) Sister    Diabetes Sister        Tobacco Use History Social History    Tobacco Use Smoking Status  Former  Types: Cigarettes Smokeless Tobacco Never      Social History Social History    Socioeconomic History  Marital status: Married Tobacco Use  Smoking status: Former     Types: Cigarettes  Smokeless tobacco: Never Vaping Use  Vaping status: Unknown Substance and Sexual Activity  Alcohol use: Never  Drug use: Never    Social Drivers of Manufacturing engineer Strain: Low Risk  (07/14/2022)   Received from [facility]   Overall Financial Resource Strain (CARDIA)    Difficulty of Paying Living Expenses: Not hard at all Food Insecurity: No Food Insecurity (07/14/2022)   Received from [facility]   Hunger Vital Sign    Within the past 12 months, you worried that  your food would run out before you got the money to buy more.: Never true    Within the past 12 months, the food you bought just didn't last and you didn't have money to get more.: Never true Transportation Needs: No Transportation Needs (07/14/2022)   Received from [facility] - Transportation    Lack of Transportation (Medical): No    Lack of Transportation (Non-Medical): No Physical Activity: Insufficiently Active (07/14/2022)   Received from [facility]   Exercise Vital Sign    On average, how many days per week do you engage in moderate to strenuous exercise (like a brisk walk)?: 3 days    On average, how many minutes do you engage in exercise at this level?: 20 min Stress: No Stress Concern Present (07/14/2022)   Received from [facility] - Occupational Stress Questionnaire    Feeling of Stress : Not at all Social Connections: Socially Integrated (07/14/2022)   Received from [facility]   Social Connection and Isolation Panel    In a typical week, how many times do you talk on the phone with family, friends, or neighbors?: More than three times a week    How often do you get together with friends or relatives?: More than three times a week    How often do you attend church or religious services?: More than 4 times per year    Do you belong to any clubs or organizations such as church groups, unions, fraternal or athletic groups, or school groups?: Yes    How often do you attend meetings of the clubs or organizations you belong to?: More than 4 times per year    Are you married, widowed, divorced, separated, never married, or living with a partner?: Married Housing Stability: Unknown (06/04/2024)   Housing Stability Vital Sign    Homeless in the Last Year: No      Objective:     Vitals:   06/04/24 1512 06/04/24 1515 BP: 127/73   Resp: 18   Temp: 36.8 C (98.3 F)   SpO2: 98%   Weight: (!) 120.4 kg (265 lb 6.4 oz)    Height: 180.3 cm (5' 11)   PainSc:   0-No pain PainLoc:   Abdomen   Body mass index is 37.02 kg/m. Physical Exam Constitutional:      Appearance: Normal appearance.  HENT:     Head: Normocephalic and atraumatic.     Nose: Nose normal. No congestion.     Mouth/Throat:     Mouth: Mucous membranes are moist.     Pharynx: Oropharynx is clear.  Eyes:     Pupils: Pupils are equal, round, and reactive to light.  Cardiovascular:  Rate and Rhythm: Normal rate and regular rhythm.     Pulses: Normal pulses.     Heart sounds: Normal heart sounds. No murmur heard.    No friction rub. No gallop.  Pulmonary:     Effort: Pulmonary effort is normal. No respiratory distress.     Breath sounds: Normal breath sounds. No stridor. No wheezing, rhonchi or rales.  Abdominal:     General: Abdomen is flat.     Hernia: A hernia is present. Hernia is present in the left inguinal area and right inguinal area.  Musculoskeletal:        General: Normal range of motion.     Cervical back: Normal range of motion.  Skin:    General: Skin is warm and dry.  Neurological:     General: No focal deficit present.     Mental Status: He is alert and oriented to person, place, and time.  Psychiatric:        Mood and Affect: Mood normal.        Thought Content: Thought content normal.         Assessment and Plan: Diagnoses and all orders for this visit:   Unilateral inguinal hernia without obstruction or gangrene, recurrence not specified     [name] is a 78 y.o. male    1.  We will proceed to the OR for a lap possible open RIGHT inguinal hernia repair with mesh. 2. All risks and benefits were discussed with the patient, to generally include infection, bleeding, damage to surrounding structures, acute and chronic nerve pain, and recurrence. Alternatives were offered and described.  All questions were answered and the patient voiced understanding of the procedure and wishes to proceed at this  point.             No follow-ups on file.   Lynda Leos, MD, [facility], [place] [facility] Minimally Invasive Surgery

## 2024-07-17 ENCOUNTER — Ambulatory Visit (HOSPITAL_COMMUNITY)
Admission: RE | Admit: 2024-07-17 | Discharge: 2024-07-17 | Disposition: A | Discharge status: Home / Self Care | Payer: Medicare (Managed Care) | Attending: General Surgery | Admitting: General Surgery

## 2024-07-17 ENCOUNTER — Encounter (HOSPITAL_COMMUNITY)
Admission: RE | Disposition: A | Discharge status: Home / Self Care | Payer: Self-pay | Source: Home / Self Care | Attending: General Surgery

## 2024-07-17 ENCOUNTER — Encounter (HOSPITAL_COMMUNITY): Payer: Self-pay | Admitting: General Surgery

## 2024-07-17 ENCOUNTER — Ambulatory Visit (HOSPITAL_COMMUNITY): Payer: Medicare (Managed Care) | Admitting: Physician Assistant

## 2024-07-17 ENCOUNTER — Ambulatory Visit (HOSPITAL_BASED_OUTPATIENT_CLINIC_OR_DEPARTMENT_OTHER): Payer: Medicare (Managed Care) | Admitting: Physician Assistant

## 2024-07-17 ENCOUNTER — Other Ambulatory Visit (HOSPITAL_COMMUNITY): Payer: Self-pay

## 2024-07-17 DIAGNOSIS — I10 Essential (primary) hypertension: Secondary | ICD-10-CM

## 2024-07-17 DIAGNOSIS — K4030 Unilateral inguinal hernia, with obstruction, without gangrene, not specified as recurrent: Secondary | ICD-10-CM | POA: Insufficient documentation

## 2024-07-17 DIAGNOSIS — E785 Hyperlipidemia, unspecified: Secondary | ICD-10-CM | POA: Insufficient documentation

## 2024-07-17 DIAGNOSIS — Z951 Presence of aortocoronary bypass graft: Secondary | ICD-10-CM | POA: Insufficient documentation

## 2024-07-17 DIAGNOSIS — Z87891 Personal history of nicotine dependence: Secondary | ICD-10-CM | POA: Diagnosis not present

## 2024-07-17 DIAGNOSIS — I129 Hypertensive chronic kidney disease with stage 1 through stage 4 chronic kidney disease, or unspecified chronic kidney disease: Secondary | ICD-10-CM | POA: Insufficient documentation

## 2024-07-17 DIAGNOSIS — Z5331 Laparoscopic surgical procedure converted to open procedure: Secondary | ICD-10-CM

## 2024-07-17 DIAGNOSIS — K769 Liver disease, unspecified: Secondary | ICD-10-CM

## 2024-07-17 DIAGNOSIS — N189 Chronic kidney disease, unspecified: Secondary | ICD-10-CM | POA: Insufficient documentation

## 2024-07-17 DIAGNOSIS — I2510 Atherosclerotic heart disease of native coronary artery without angina pectoris: Secondary | ICD-10-CM | POA: Diagnosis not present

## 2024-07-17 DIAGNOSIS — Z01818 Encounter for other preprocedural examination: Secondary | ICD-10-CM

## 2024-07-17 DIAGNOSIS — E1122 Type 2 diabetes mellitus with diabetic chronic kidney disease: Secondary | ICD-10-CM | POA: Diagnosis not present

## 2024-07-17 DIAGNOSIS — E119 Type 2 diabetes mellitus without complications: Secondary | ICD-10-CM

## 2024-07-17 HISTORY — PX: INGUINAL HERNIA REPAIR: SHX194

## 2024-07-17 LAB — POCT I-STAT, CHEM 8
BUN: 38 mg/dL — ABNORMAL HIGH (ref 8–23)
Calcium, Ion: 1.34 mmol/L (ref 1.15–1.40)
Chloride: 105 mmol/L (ref 98–111)
Creatinine, Ser: 1.7 mg/dL — ABNORMAL HIGH (ref 0.61–1.24)
Glucose, Bld: 147 mg/dL — ABNORMAL HIGH (ref 70–99)
HCT: 41 % (ref 39.0–52.0)
Hemoglobin: 13.9 g/dL (ref 13.0–17.0)
Potassium: 6.1 mmol/L — ABNORMAL HIGH (ref 3.5–5.1)
Sodium: 136 mmol/L (ref 135–145)
TCO2: 28 mmol/L (ref 22–32)

## 2024-07-17 LAB — CBC
HCT: 40 % (ref 39.0–52.0)
Hemoglobin: 13.5 g/dL (ref 13.0–17.0)
MCH: 32.8 pg (ref 26.0–34.0)
MCHC: 33.8 g/dL (ref 30.0–36.0)
MCV: 97.1 fL (ref 80.0–100.0)
Platelets: 224 K/uL (ref 150–400)
RBC: 4.12 MIL/uL — ABNORMAL LOW (ref 4.22–5.81)
RDW: 14.6 % (ref 11.5–15.5)
WBC: 7.6 K/uL (ref 4.0–10.5)
nRBC: 0 % (ref 0.0–0.2)

## 2024-07-17 LAB — GLUCOSE, CAPILLARY
Glucose-Capillary: 177 mg/dL — ABNORMAL HIGH (ref 70–99)
Glucose-Capillary: 170 mg/dL — ABNORMAL HIGH (ref 70–99)

## 2024-07-17 SURGERY — REPAIR, HERNIA, INGUINAL, LAPAROSCOPIC
Anesthesia: General | Laterality: Right

## 2024-07-17 MED ORDER — ORAL CARE MOUTH RINSE: 15.0000 mL | Freq: Once | OROMUCOSAL | Status: AC

## 2024-07-17 MED ORDER — INSULIN ASPART 100 UNIT/ML IJ SOLN: 0.0000 [IU] | INTRAMUSCULAR | Status: DC | PRN

## 2024-07-17 MED ORDER — ROCURONIUM BROMIDE 10 MG/ML (PF) SYRINGE
PREFILLED_SYRINGE | INTRAVENOUS | Status: DC | PRN
  Administered 2024-07-17: 10 mg via INTRAVENOUS
  Administered 2024-07-17: 50 mg via INTRAVENOUS
  Administered 2024-07-17: 20 mg via INTRAVENOUS
  Administered 2024-07-17: 10 mg via INTRAVENOUS

## 2024-07-17 MED ORDER — OXYCODONE HCL 5 MG PO TABS
ORAL_TABLET | ORAL | Status: AC
  Filled 2024-07-17: qty 1

## 2024-07-17 MED ORDER — FENTANYL CITRATE (PF) 100 MCG/2ML IJ SOLN
25.0000 ug | INTRAMUSCULAR | Status: DC | PRN
  Administered 2024-07-17 (×2): 50 ug via INTRAVENOUS

## 2024-07-17 MED ORDER — FENTANYL CITRATE (PF) 250 MCG/5ML IJ SOLN
INTRAMUSCULAR | Status: AC
  Filled 2024-07-17: qty 5

## 2024-07-17 MED ORDER — CHLORHEXIDINE GLUCONATE 0.12 % MT SOLN
OROMUCOSAL | Status: AC
  Administered 2024-07-17: 15 mL via OROMUCOSAL
  Filled 2024-07-17: qty 15

## 2024-07-17 MED ORDER — ALBUTEROL SULFATE HFA 108 (90 BASE) MCG/ACT IN AERS
INHALATION_SPRAY | RESPIRATORY_TRACT | Status: DC | PRN
  Administered 2024-07-17: 4 via RESPIRATORY_TRACT

## 2024-07-17 MED ORDER — PROPOFOL 10 MG/ML IV BOLUS
INTRAVENOUS | Status: AC
  Filled 2024-07-17: qty 20

## 2024-07-17 MED ORDER — ENSURE PRE-SURGERY PO LIQD: 296.0000 mL | Freq: Once | ORAL | Status: DC

## 2024-07-17 MED ORDER — LACTATED RINGERS IV SOLN
INTRAVENOUS | Status: DC
Start: 2024-07-17 — End: 2024-07-17

## 2024-07-17 MED ORDER — ACETAMINOPHEN 500 MG PO TABS
ORAL_TABLET | ORAL | Status: AC
  Administered 2024-07-17: 1000 mg via ORAL
  Filled 2024-07-17: qty 2

## 2024-07-17 MED ORDER — CEFAZOLIN SODIUM-DEXTROSE 2-4 GM/100ML-% IV SOLN
INTRAVENOUS | Status: AC
  Filled 2024-07-17: qty 100

## 2024-07-17 MED ORDER — CHLORHEXIDINE GLUCONATE CLOTH 2 % EX PADS: 6.0000 | MEDICATED_PAD | Freq: Once | CUTANEOUS | Status: DC

## 2024-07-17 MED ORDER — LIDOCAINE 2% (20 MG/ML) 5 ML SYRINGE
INTRAMUSCULAR | Status: DC | PRN
  Administered 2024-07-17: 100 mg via INTRAVENOUS

## 2024-07-17 MED ORDER — PROPOFOL 10 MG/ML IV BOLUS
INTRAVENOUS | Status: DC | PRN
  Administered 2024-07-17 (×2): 100 mg via INTRAVENOUS

## 2024-07-17 MED ORDER — OXYCODONE HCL 5 MG PO TABS
5.0000 mg | ORAL_TABLET | Freq: Once | ORAL | Status: AC | PRN
  Administered 2024-07-17: 5 mg via ORAL

## 2024-07-17 MED ORDER — FENTANYL CITRATE (PF) 250 MCG/5ML IJ SOLN
INTRAMUSCULAR | Status: DC | PRN
  Administered 2024-07-17 (×5): 50 ug via INTRAVENOUS

## 2024-07-17 MED ORDER — OXYCODONE-ACETAMINOPHEN 5-325 MG PO TABS
1.0000 | ORAL_TABLET | ORAL | 0 refills | Status: AC | PRN
  Filled 2024-07-17: qty 20, 4d supply, fill #0

## 2024-07-17 MED ORDER — FENTANYL CITRATE (PF) 100 MCG/2ML IJ SOLN
INTRAMUSCULAR | Status: AC
  Filled 2024-07-17: qty 2

## 2024-07-17 MED ORDER — ONDANSETRON HCL 4 MG/2ML IJ SOLN: 4.0000 mg | Freq: Once | INTRAMUSCULAR | Status: DC | PRN

## 2024-07-17 MED ORDER — MEPERIDINE HCL 25 MG/ML IJ SOLN
6.2500 mg | INTRAMUSCULAR | Status: DC | PRN
  Filled 2024-07-17: qty 1

## 2024-07-17 MED ORDER — CHLORHEXIDINE GLUCONATE 0.12 % MT SOLN: 15.0000 mL | Freq: Once | OROMUCOSAL | Status: AC

## 2024-07-17 MED ORDER — PHENYLEPHRINE 80 MCG/ML (10ML) SYRINGE FOR IV PUSH (FOR BLOOD PRESSURE SUPPORT)
PREFILLED_SYRINGE | INTRAVENOUS | Status: DC | PRN
  Administered 2024-07-17 (×2): 160 ug via INTRAVENOUS

## 2024-07-17 MED ORDER — OXYCODONE HCL 5 MG/5ML PO SOLN: 5.0000 mg | Freq: Once | ORAL | Status: AC | PRN

## 2024-07-17 MED ORDER — BUPIVACAINE-EPINEPHRINE 0.25% -1:200000 IJ SOLN
INTRAMUSCULAR | Status: DC | PRN
  Administered 2024-07-17: 20 mL
  Administered 2024-07-17: 10 mL

## 2024-07-17 MED ORDER — DEXAMETHASONE SODIUM PHOSPHATE 10 MG/ML IJ SOLN
INTRAMUSCULAR | Status: DC | PRN
  Administered 2024-07-17: 10 mg via INTRAVENOUS

## 2024-07-17 MED ORDER — ACETAMINOPHEN 500 MG PO TABS: 1000.0000 mg | ORAL_TABLET | ORAL | Status: AC

## 2024-07-17 MED ORDER — PHENYLEPHRINE HCL-NACL 20-0.9 MG/250ML-% IV SOLN
INTRAVENOUS | Status: DC | PRN
  Administered 2024-07-17: 60 ug/min via INTRAVENOUS

## 2024-07-17 MED ORDER — SUGAMMADEX SODIUM 200 MG/2ML IV SOLN
INTRAVENOUS | Status: DC | PRN
  Administered 2024-07-17: 300 mg via INTRAVENOUS

## 2024-07-17 MED ORDER — CHLORHEXIDINE GLUCONATE CLOTH 2 % EX PADS
6.0000 | MEDICATED_PAD | Freq: Once | CUTANEOUS | Status: DC
Start: 2024-07-17 — End: 2024-07-17

## 2024-07-17 MED ORDER — CEFAZOLIN SODIUM-DEXTROSE 3-4 GM/150ML-% IV SOLN
3.0000 g | INTRAVENOUS | Status: AC
Start: 2024-07-17 — End: 2024-07-17
  Administered 2024-07-17: 3 g via INTRAVENOUS

## 2024-07-17 MED ORDER — ONDANSETRON HCL 4 MG/2ML IJ SOLN
INTRAMUSCULAR | Status: DC | PRN
  Administered 2024-07-17: 4 mg via INTRAVENOUS

## 2024-07-17 SURGICAL SUPPLY — 51 items
BAG COUNTER SPONGE SURGICOUNT (BAG) ×1 IMPLANT
BLADE CLIPPER SURG (BLADE) IMPLANT
CANISTER SUCTION 3000ML PPV (SUCTIONS) IMPLANT
CHLORAPREP W/TINT 26 (MISCELLANEOUS) ×1 IMPLANT
COVER SURGICAL LIGHT HANDLE (MISCELLANEOUS) ×1 IMPLANT
DERMABOND ADVANCED .7 DNX12 (GAUZE/BANDAGES/DRESSINGS) ×1 IMPLANT
DISSECTOR BLUNT TIP ENDO 5MM (MISCELLANEOUS) IMPLANT
DRAIN PENROSE .5X12 LATEX STL (DRAIN) IMPLANT
DRAPE LAPAROSCOPIC ABDOMINAL (DRAPES) ×1 IMPLANT
ELECTRODE REM PT RTRN 9FT ADLT (ELECTROSURGICAL) ×1 IMPLANT
ENDOLOOP SUT PDS II 0 18 (SUTURE) IMPLANT
GAUZE 4X4 16PLY ~~LOC~~+RFID DBL (SPONGE) ×1 IMPLANT
GLOVE BIO SURGEON STRL SZ7.5 (GLOVE) ×2 IMPLANT
GLOVE BIOGEL PI IND STRL 8 (GLOVE) ×1 IMPLANT
GOWN STRL REUS W/ TWL LRG LVL3 (GOWN DISPOSABLE) ×2 IMPLANT
GOWN STRL REUS W/ TWL XL LVL3 (GOWN DISPOSABLE) ×1 IMPLANT
IRRIGATION SUCT STRKRFLW 2 WTP (MISCELLANEOUS) IMPLANT
KIT BASIN OR (CUSTOM PROCEDURE TRAY) ×1 IMPLANT
KIT TURNOVER KIT B (KITS) ×1 IMPLANT
MESH PARIETEX PROGRIP RIGHT (Mesh General) IMPLANT
NDL HYPO 25GX1X1/2 BEV (NEEDLE) ×1 IMPLANT
NDL INSUFFLATION 14GA 120MM (NEEDLE) IMPLANT
NEEDLE HYPO 25GX1X1/2 BEV (NEEDLE) ×1 IMPLANT
NEEDLE INSUFFLATION 14GA 120MM (NEEDLE) IMPLANT
NS IRRIG 1000ML POUR BTL (IV SOLUTION) ×1 IMPLANT
PACK GENERAL/GYN (CUSTOM PROCEDURE TRAY) ×1 IMPLANT
PAD ARMBOARD POSITIONER FOAM (MISCELLANEOUS) ×2 IMPLANT
PENCIL SMOKE EVACUATOR (MISCELLANEOUS) ×1 IMPLANT
RELOAD STAPLE 4.0 BLU F/HERNIA (INSTRUMENTS) IMPLANT
SCISSORS LAP 5X35 DISP (ENDOMECHANICALS) ×1 IMPLANT
SET TUBE SMOKE EVAC HIGH FLOW (TUBING) ×1 IMPLANT
SPONGE INTESTINAL PEANUT (DISPOSABLE) IMPLANT
STAPLER HERNIA 12 8.5 360D (INSTRUMENTS) IMPLANT
SUT MNCRL AB 4-0 PS2 18 (SUTURE) ×1 IMPLANT
SUT PROLENE 2 0 SH DA (SUTURE) ×1 IMPLANT
SUT SILK 0 TIES 10X30 (SUTURE) ×1 IMPLANT
SUT VIC AB 1 CT1 27XBRD ANBCTR (SUTURE) IMPLANT
SUT VIC AB 2-0 SH 27X BRD (SUTURE) ×1 IMPLANT
SUT VIC AB 3-0 SH 27XBRD (SUTURE) ×1 IMPLANT
SUT VICRYL AB 2 0 TIES (SUTURE) ×1 IMPLANT
SYR CONTROL 10ML LL (SYRINGE) ×1 IMPLANT
SYRINGE TOOMEY DISP (SYRINGE) ×1 IMPLANT
TOWEL GREEN STERILE (TOWEL DISPOSABLE) ×1 IMPLANT
TOWEL GREEN STERILE FF (TOWEL DISPOSABLE) ×1 IMPLANT
TRAY FOL W/BAG SLVR 16FR STRL (SET/KITS/TRAYS/PACK) IMPLANT
TRAY LAPAROSCOPIC MC (CUSTOM PROCEDURE TRAY) ×1 IMPLANT
TROCAR OPTICAL SHORT 5MM (TROCAR) ×1 IMPLANT
TROCAR OPTICAL SLV SHORT 5MM (TROCAR) ×1 IMPLANT
TROCAR Z THREAD OPTICAL 12X100 (TROCAR) ×1 IMPLANT
WARMER LAPAROSCOPE (MISCELLANEOUS) ×1 IMPLANT
WATER STERILE IRR 1000ML POUR (IV SOLUTION) ×1 IMPLANT

## 2024-07-17 NOTE — Anesthesia Postprocedure Evaluation (Signed)
 Anesthesia Post Note  Patient: [name]  Procedure(s) Performed: REPAIR, HERNIA, INGUINAL, LAPAROSCOPIC, attemped (Right) OPEN REPAIR, HERNIA, INGUINAL, ADULT (Right)     Patient location during evaluation: PACU Anesthesia Type: General Level of consciousness: awake and alert Pain management: pain level controlled Vital Signs Assessment: post-procedure vital signs reviewed and stable Respiratory status: spontaneous breathing, nonlabored ventilation, respiratory function stable and patient connected to nasal cannula oxygen Cardiovascular status: blood pressure returned to baseline and stable Postop Assessment: no apparent nausea or vomiting Anesthetic complications: no   No notable events documented.  Last Vitals:  Vitals:   07/17/24 1230 07/17/24 1245  BP: 131/67 131/66  Pulse: 72 66  Resp: 10 12  Temp: 36.5 C   SpO2: 93% 95%    Last Pain:  Vitals:   07/17/24 1230  TempSrc:   PainSc: 3                  Raley Novicki

## 2024-07-17 NOTE — Op Note (Signed)
 07/17/2024  11:21 AM  PATIENT:  [name]  78 y.o. male  PRE-OPERATIVE DIAGNOSIS:  RIGHT INCARCERATED INGUINAL HERNIA  POST-OPERATIVE DIAGNOSIS:  RIGHT INCARCERATED INGUINAL HERNIA  PROCEDURE:  Procedure(s) with comments: REPAIR, HERNIA, INGUINAL, LAPAROSCOPIC, attemped (Right) - LAPAROSCOPIC ,OPEN RIGHT INGUINAL HERNIA REPAIR WITH MESH OPEN REPAIR, HERNIA, INGUINAL, ADULT (Right)  SURGEON:  Surgeons and Role:    DEWAINE Rubin Calamity, MD - Primary  ASSISTANTS: Keven Piety, RNFA   ANESTHESIA:   local and general  EBL:  30 mL   BLOOD ADMINISTERED:none  DRAINS: none   LOCAL MEDICATIONS USED:  BUPIVICAINE   SPECIMEN:  No Specimen  DISPOSITION OF SPECIMEN:  N/A  COUNTS:  YES  TOURNIQUET:  * No tourniquets in log *  DICTATION: .Dragon Dictation  Counts: reported as correct x 2  Findings:  The patient had a large right direct hernia  Indications for procedure:  The patient is a 78 year old male with a right inguinal hernia for several months. Patient complained of symptomatology to his right inguinal area. The patient was taken back for elective inguinal hernia repair.  Details of the procedure: The patient was taken back to the operating room. The patient was placed in supine position with bilateral SCDs in place.  The patient was prepped and draped in the usual sterile fashion.  After appropriate anitbiotics were confirmed, a time-out was confirmed and all facts were verified.  0.25% Marcaine  was used to infiltrate the umbilical area. A 11-blade was used to cut down the skin and blunt dissection was used to get the anterior fashion.  The anterior fascia was incised approximately 1 cm and the muscles were retracted laterally. Blunt dissection was then used to create a space in the preperitoneal area. At this time a 10 mm camera was then introduced into the space and advanced the pubic tubercle and a 12 mm trocar was placed over this and insufflation was started.  At  this time and space was created from medial to laterally the preperitoneal space.  I attempts to try and make a space and visualize the anatomy but due to adhesions, I was unable to.  I decided to proceed to an open hernia repair. The insufflation was evacuated and the peritoneum was seen posterior to the mesh. The trochars were removed. The anterior fascia was reapproximated using #1 Vicryl on a UR- 6.  Intra-abdominal air was evacuated and the Veress needle removed. The skin was reapproximated using 4-0 Monocryl subcuticular fashion and Dermabond.   A 5 cm incision was made just 1 cm superior to the inguinal ligament. Bovie cautery was used to maintain hemostasis dissection is carried down to the external oblique.  A standard incision was made laterally, and the external oblique was bluntly dissected away from the surrounding tissue with Metzenbaum scissors. The external oblique was elevated in the spermatic cord was bluntly dissected away from the surrounding tissue.  The ilioinguinal nerve was not identified.   The spermatic cord and the hernia were then bluntly dissected away from the pubic tubercle and a Penrose was placed around the hernia sac in the spermatic cord. The vas deferens was identified and protected at all portions of the case. Dissection of the cremasterics took place with Bovie cautery. One the hernia was seen in the direct space and consisted of fat.  It was reduced and a 0 vicryl was used to close the space.  The cremesterics was dissected away and no indirect hernia was seen.  At this time a  right-sided Progrip mesh was then anchored to the pubic tubercle with a 2-0 Prolene.  It was anchored to the shelving edge of the external oblique x 1 and the conjoint tendon cephalad x 1.  The wrap around of the mesh was sutured to the conjoint tendon as well.  The new internal ring did not strangulate the spermatic cord.   The tail was then tucked under the external oblique. At this time the area  was irrigated out with sterile saline.    The external oblique was reapproximated using a 2-0 Vicryl in a running fashion. Scarpa's fascia was then reapproximated using a 3-0 Vicryl running fashion. The skin was then reapproximated with 4 Monocryl in a subcuticular fashion. The skin was then dressed with Dermabond.  The patient was taken to the recovery room in stable condition.    PLAN OF CARE: Discharge to home after PACU  PATIENT DISPOSITION:  PACU - hemodynamically stable.   Delay start of Pharmacological VTE agent (>24hrs) due to surgical blood loss or risk of bleeding: not applicable

## 2024-07-17 NOTE — Transfer of Care (Signed)
 Immediate Anesthesia Transfer of Care Note  Patient: [name]  Procedure(s) Performed: REPAIR, HERNIA, INGUINAL, LAPAROSCOPIC, attemped (Right) OPEN REPAIR, HERNIA, INGUINAL, ADULT (Right)  Patient Location: PACU  Anesthesia Type:General  Level of Consciousness: awake, alert , and patient cooperative  Airway & Oxygen Therapy: Patient Spontanous Breathing and Patient connected to nasal cannula oxygen  Post-op Assessment: Report given to RN, Post -op Vital signs reviewed and stable, and Patient moving all extremities X 4  Post vital signs: Reviewed and stable  Last Vitals:  Vitals Value Taken Time  BP 136/77 07/17/24 11:32  Temp    Pulse 78 07/17/24 11:38  Resp 18 07/17/24 11:38  SpO2 97 % 07/17/24 11:38  Vitals shown include unfiled device data.  Last Pain:  Vitals:   07/17/24 0824  TempSrc:   PainSc: 0-No pain         Complications: No notable events documented.

## 2024-07-17 NOTE — Progress Notes (Addendum)
 Called main lab at 0905 to see the status of CBC and CMP that was sent 30 min prior. Per lab they put the sample aside and will run them stat. Should be resolved by 10 am. Dr. Mallory is aware.

## 2024-07-17 NOTE — Anesthesia Procedure Notes (Signed)
 Procedure Name: Intubation Date/Time: 07/17/2024 10:08 AM  Performed by: Lamar Lucie DASEN, CRNAPre-anesthesia Checklist: Patient identified, Emergency Drugs available, Suction available and Patient being monitored Patient Re-evaluated:Patient Re-evaluated prior to induction Oxygen Delivery Method: Circle system utilized Preoxygenation: Pre-oxygenation with 100% oxygen Induction Type: IV induction Ventilation: Mask ventilation without difficulty Laryngoscope Size: Mac and 4 Grade View: Grade I Tube type: Oral Tube size: 7.5 mm Number of attempts: 1 Airway Equipment and Method: Stylet and Oral airway Placement Confirmation: ETT inserted through vocal cords under direct vision, positive ETCO2 and breath sounds checked- equal and bilateral Secured at: 22 cm Tube secured with: Tape Dental Injury: Teeth and Oropharynx as per pre-operative assessment

## 2024-07-17 NOTE — Interval H&P Note (Signed)
 History and Physical Interval Note:  07/17/2024 9:16 AM  [name]  has presented today for surgery, with the diagnosis of RIGHT INCARCERATED INGUINAL HERNIA.  The various methods of treatment have been discussed with the patient and family. After consideration of risks, benefits and other options for treatment, the patient has consented to  Procedure(s) with comments: REPAIR, HERNIA, INGUINAL, LAPAROSCOPIC (Right) - LAPAROSCOPIC POSSIBLE OPEN RIGHT INGUINAL HERNIA REPAIR WITH MESH REPAIR, HERNIA, INGUINAL, ADULT (Right) as a surgical intervention.  The patient's history has been reviewed, patient examined, no change in status, stable for surgery.  I have reviewed the patient's chart and labs.  Questions were answered to the patient's satisfaction.     [name]

## 2024-07-17 NOTE — Discharge Instructions (Signed)

## 2024-07-18 ENCOUNTER — Encounter (HOSPITAL_COMMUNITY): Payer: Self-pay | Admitting: General Surgery

## 2024-07-21 ENCOUNTER — Other Ambulatory Visit: Payer: Self-pay | Admitting: Family Medicine

## 2024-07-21 ENCOUNTER — Ambulatory Visit: Payer: Self-pay

## 2024-07-21 DIAGNOSIS — M5441 Lumbago with sciatica, right side: Secondary | ICD-10-CM

## 2024-07-21 NOTE — Telephone Encounter (Signed)
 First attempt; no answer.  Had surgery 5 days for a hernia. Patient has not had a bowel movement in 5 days. Call back #[phone].

## 2024-07-21 NOTE — Telephone Encounter (Signed)
 FYI Only or Action Required?: FYI only for provider.  Patient was last seen in primary care on 05/15/2024 by Mercer Clotilda SAUNDERS, MD.  Called Nurse Triage reporting Constipation.  Symptoms began several days ago.  Interventions attempted: Nothing.  Symptoms are: unchanged.  Triage Disposition: See PCP When Office is Open (Within 3 Days)  Patient/caregiver understands and will follow disposition?: Yes Reason for Disposition  Unable to have a bowel movement (BM) without manually removing stool (using finger to pull out stool or perform disimpaction)  Answer Assessment - Initial Assessment Questions 1. STOOL PATTERN OR FREQUENCY: How often do you have a bowel movement (BM)?  (Normal range: 3 times a day to every 3 days)  When was your last BM?       Once a day 2. STRAINING: Do you have to strain to have a BM?      yes 3. ONSET: When did the constipation begin?     5 days ago 4. RECTAL PAIN: Does your rectum hurt when the stool comes out? If Yes, ask: Do you have hemorrhoids? How bad is the pain?  (Scale 1-10; or mild, moderate, severe)     denies 5. BM COMPOSITION: Are the stools hard?      none 6. BLOOD ON STOOLS: Has there been any blood on the toilet tissue or on the surface of the BM? If Yes, ask: When was the last time?     denies 7. CHRONIC CONSTIPATION: Is this a new problem for you?  If No, ask: How long have you had this problem? (days, weeks, months)      denies 8. CHANGES IN DIET OR HYDRATION: Have there been any recent changes in your diet? How much fluids are you drinking on a daily basis?  How much have you had to drink today?     Had recent hernia surgery 5 days 9. MEDICINES: Have you been taking any new medicines? Are you taking any narcotic pain medicines? (e.g., Dilaudid , morphine , Percocet, Vicodin)     oxycodone  10. LAXATIVES: Have you been using any stool softeners, laxatives, or enemas?  If Yes, ask What are you using, how often,  and when was the last time?       denies 11. ACTIVITY:  How much walking do you do every day?  Has your activity level decreased in the past week?        Yes due to surgery 12. CAUSE: What do you think is causing the constipation?        Recent surgery 13. MEDICAL HISTORY: Do you have a history of hemorrhoids, rectal fissures, rectal surgery, or rectal abscess?         denies 14. OTHER SYMPTOMS: Do you have any other symptoms? (e.g., abdomen pain, bloating, fever, vomiting)       no 15. PREGNANCY: Is there any chance you are pregnant? When was your last menstrual period?       na  Protocols used: Constipation-A-AH

## 2024-07-22 ENCOUNTER — Ambulatory Visit: Payer: Medicare (Managed Care) | Admitting: Internal Medicine

## 2024-07-22 ENCOUNTER — Ambulatory Visit: Payer: Self-pay

## 2024-07-22 NOTE — Telephone Encounter (Signed)
 FYI Only or Action Required?: Action required by provider: states he was able to have a bm and no longer feels the need for an appointment.  This RN did encourage him to keep appointment for today but patient kindly declined.  States will call back if in need of future appointment.  Patient was last seen in primary care on 05/15/2024 by Mercer Clotilda SAUNDERS, MD.  Called Nurse Triage reporting Advice Only.  Symptoms began yesterday.  Interventions attempted: Nothing.  Symptoms are: completely resolved.  Triage Disposition: Call PCP Within 24 Hours  Patient/caregiver understands and will follow disposition?:     Copied from CRM #[id number]. Topic: Clinical - Pink Word Triage >> Jul 21, 2024  9:10 AM Kevelyn M wrote: Reason for Triage: Had surgery 5 days for a hernia. Patient has not had a bowel movement in 5 days. Call back #[phone]. >> Jul 22, 2024  9:24 AM Mia F wrote: Pt wants to cancle todays appt. States that he feels better and he was able to make a bowl movement twice. Last night and this morning.  >> Jul 21, 2024  9:12 AM Kevelyn M wrote: Had surgery 5 days for a hernia. Patient has not had a bowel movement in 5 days. Call back #[phone].

## 2024-08-14 ENCOUNTER — Ambulatory Visit: Payer: Medicare (Managed Care) | Admitting: Family Medicine

## 2024-08-14 DIAGNOSIS — Z0000 Encounter for general adult medical examination without abnormal findings: Secondary | ICD-10-CM

## 2024-08-14 NOTE — Progress Notes (Signed)
 PATIENT CHECK-IN and HEALTH RISK ASSESSMENT QUESTIONNAIRE:  -completed by phone/video for upcoming Medicare Preventive Visit  Pre-Visit Check-in: 1)Vitals (height, wt, BP, etc) - record in vitals section for visit on day of visit Request home vitals (wt, BP, etc.) and enter into vitals, THEN update Vital Signs SmartPhrase below at the top of the HPI. See below.  2)Review and Update Medications, Allergies PMH, Surgeries, Social history in Epic 3)Hospitalizations in the last year with date/reason? no  4)Review and Update Care Team (patient's specialists) in Epic 5) Complete PHQ9 in Epic  6) Complete Fall Screening in Epic 7)Review all Health Maintenance Due and order if not done.  Medicare Wellness Patient Questionnaire:  Answer theses question about your habits: How often do you have a drink containing alcohol?every couple weeks How many drinks containing alcohol do you have on a typical day when you are drinking?1-2 drinks, has cut back significantly How often do you have six or more drinks on one occasion?never Have you ever smoked?n Quit date if applicable? na  How many packs a day do/did you smoke? na Do you use smokeless tobacco?n Do you use an illicit drugs?n On average, how many days per week do you engage in moderate to strenuous exercise (like a brisk walk)?right now walking some in the back yard.  On average, how many minutes do you engage in exercise at this level?10 minutes a day Typical lunch:chicken and fish, veggies (leftover) Typical dinner: chicken and fish, veggies (loves cabbage and broccoli), whole wheat bread, pasta Typical snacks:doesn't usually snacks - occ might have a small amount of chips  Beverages: ice water , some Gatorade every now and then  Answer theses question about your everyday activities: Can you perform most household chores? y Are you deaf or have significant trouble hearing?n Do you feel that you have a problem with memory?n Do you feel safe at  home?y Last dentist visit?dentures 8. Do you have any difficulty performing your everyday activities?n Are you having any difficulty walking, taking medications on your own, and or difficulty managing daily home needs?n Do you have difficulty walking or climbing stairs?n Do you have difficulty dressing or bathing?n Do you have difficulty doing errands alone such as visiting a doctor's office or shopping?n Do you currently have any difficulty preparing food and eating?n Do you currently have any difficulty using the toilet?n Do you have any difficulty managing your finances?n Do you have any difficulties with housekeeping of managing your housekeeping?n   Do you have Advanced Directives in place (Living Will, Healthcare Power or Attorney)? y   Last eye Exam and location? Seeing eye doctor this month, Dr. Paul   Do you currently use prescribed or non-prescribed narcotic or opioid pain medications?still takes 1 tramadol  as needed, not every day  Do you have a history or close family history of breast, ovarian, tubal or peritoneal cancer or a family member with BRCA (breast cancer susceptibility 1 and 2) gene mutations? See FH/ PMH    ----------------------------------------------------------------------------------------------------------------------------------------------------------------------------------------------------------------------  Because this visit was a virtual/telehealth visit, some criteria may be missing or patient reported. Any vitals not documented were not able to be obtained and vitals that have been documented are patient reported.    MEDICARE ANNUAL PREVENTIVE CARE VISIT WITH PROVIDER (Welcome to Medicare, initial annual wellness or annual wellness exam)  Virtual Visit via Phone Note  I connected with [name] on 08/14/24  by phone and verified that I am speaking with the correct person using two identifiers.He tried to  do a video visit but was  unable to connect so he opted to continue by phone.   Location patient: home Location provider:work or home office Persons participating in the virtual visit: patient, provider  Concerns and/or follow up today: reports has been doing ok. Had a hernia surgery in September, healing from tha and is healing well per his report.    See HM section in Epic for other details of completed HM.    ROS: negative for report of fevers, unintentional weight loss, vision changes, vision loss, hearing loss or change, chest pain, sob, hemoptysis, melena, hematochezia, hematuria, falls, bleeding or bruising, thoughts of suicide or self harm, memory loss  Patient-completed extensive health risk assessment - reviewed and discussed with the patient: See Health Risk Assessment completed with patient prior to the visit either above or in recent phone note. This was reviewed in detailed with the patient today and appropriate recommendations, orders and referrals were placed as needed per Summary below and patient instructions.   Review of Medical History: -PMH, PSH, Family History and current specialty and care providers reviewed and updated and listed below   Patient Care Team: Mercer Clotilda SAUNDERS, MD as PCP - General (Family Medicine) Paul Barrio, OD as Referring Physician (Optometry) [place], [facility] [address] Dorn PARAS, MD as Consulting Physician (Cardiology)   Past Medical History:  Diagnosis Date   CAD, ARTERY BYPASS GRAFT 02/10/2008   CAROTID ARTERY STENOSIS, WITHOUT INFARCTION 09/22/2008   CORONARY ARTERY DISEASE 04/03/2008   Diabetes mellitus    type 2, diet controlled, no meds   ERECTILE DYSFUNCTION 01/14/2009   Fatty liver 11/30/2023   GOUT 01/16/2008   HERPES ZOSTER 04/15/2010   HYPERLIPIDEMIA 01/16/2008   HYPERTENSION 01/16/2008   IMPAIRED GLUCOSE TOLERANCE 08/22/2010   Shingles 11/06/2009   left chest   Ulcer 30 yrs ago   stomach    Past Surgical History:  Procedure  Laterality Date   COLONOSCOPY  10/12/2022   CORONARY ARTERY BYPASS GRAFT  11/07/2007   vessels x3   EAR CYST EXCISION N/A 07/29/2013   Procedure: excision of urachal cyst flexible cystoscopy insertion of foley cath;  Surgeon: Donnice Gwenyth Brooks, MD;  Location: [facility];  Service: Urology;  Laterality: N/A;   INGUINAL HERNIA REPAIR Right 07/17/2024   Procedure: REPAIR, HERNIA, INGUINAL, LAPAROSCOPIC, attemped;  Surgeon: Rubin Calamity, MD;  Location: [facility];  Service: General;  Laterality: Right;  LAPAROSCOPIC POSSIBLE OPEN RIGHT INGUINAL HERNIA REPAIR WITH MESH   INGUINAL HERNIA REPAIR Right 07/17/2024   Procedure: OPEN REPAIR, HERNIA, INGUINAL, ADULT;  Surgeon: Rubin Calamity, MD;  Location: [facility];  Service: General;  Laterality: Right;   LAPAROSCOPY N/A 07/29/2013   Procedure: LAPAROSCOPY DIAGNOSTIC  laparoscopic excision of urachal cyst ;  Surgeon: Redell Faith, DO;  Location: [facility];  Service: General;  Laterality: N/A;   LAPAROTOMY  08/25/2012   Procedure: EXPLORATORY LAPAROTOMY;  Surgeon: Redell Faith, DO;  Location: [facility];  Service: General;  Laterality: N/A;  incision and drainage abdominal wall abcessand intra abdominal wall abcess   PARTIAL GASTRECTOMY  11/06/1972   bleeding ulcers   urachal  11/06/2012   Urachal cyst removal    Social History   Socioeconomic History   Marital status: Married    Spouse name: Not on file   Number of children: 3   Years of education: Not on file   Highest education level: Not on file  Occupational History    Employer: [employer]  Tobacco Use  Smoking status: Former    Current packs/day: 0.00    Average packs/day: 1 pack/day for 10.0 years (10.0 ttl pk-yrs)    Types: Cigarettes    Start date: 07/26/1989    Quit date: 07/27/1999    Years since quitting: 25.0   Smokeless tobacco: Never  Vaping Use   Vaping status: Never Used  Substance and Sexual Activity   Alcohol use: Yes    Comment: 4 beers a day - 28 beer week, patient  trying to stop 07/2024   Drug use: No   Sexual activity: Not Currently  Other Topics Concern   Not on file  Social History Narrative   epworth sleepiness scale score: 4   Social Drivers of Corporate investment banker Strain: Low Risk  (07/14/2022)   Overall Financial Resource Strain (CARDIA)    Difficulty of Paying Living Expenses: Not hard at all  Food Insecurity: No Food Insecurity (07/14/2022)   Hunger Vital Sign    Worried About Running Out of Food in the Last Year: Never true    Ran Out of Food in the Last Year: Never true  Transportation Needs: No Transportation Needs (07/14/2022)   PRAPARE - Administrator, Civil Service (Medical): No    Lack of Transportation (Non-Medical): No  Physical Activity: Insufficiently Active (07/14/2022)   Exercise Vital Sign    Days of Exercise per Week: 3 days    Minutes of Exercise per Session: 20 min  Stress: No Stress Concern Present (07/14/2022)   [employer] of Occupational Health - Occupational Stress Questionnaire    Feeling of Stress : Not at all  Social Connections: Socially Integrated (07/14/2022)   Social Connection and Isolation Panel    Frequency of Communication with Friends and Family: More than three times a week    Frequency of Social Gatherings with Friends and Family: More than three times a week    Attends Religious Services: More than 4 times per year    Active Member of [employer] or Organizations: Yes    Attends Engineer, structural: More than 4 times per year    Marital Status: Married  Catering manager Violence: Not At Risk (07/14/2022)   Humiliation, Afraid, Rape, and Kick questionnaire    Fear of Current or Ex-Partner: No    Emotionally Abused: No    Physically Abused: No    Sexually Abused: No    Family History  Problem Relation Age of Onset   Heart disease Mother    Heart disease Sister    Colon cancer Neg Hx     Current Outpatient Medications on File Prior to Visit  Medication Sig Dispense  Refill   allopurinol  (ZYLOPRIM ) 100 MG tablet Take 1 tablet (100 mg total) by mouth daily. 90 tablet 3   amLODipine  (NORVASC ) 10 MG tablet Take 1 tablet (10 mg total) by mouth daily. 90 tablet 3   aspirin  EC 81 MG tablet Take 81 mg by mouth in the morning. Swallow whole.     atorvastatin  (LIPITOR ) 80 MG tablet TAKE 1 TABLET BY MOUTH EVERY DAY IN THE MORNING 90 tablet 2   colchicine  0.6 MG tablet TAKE 2 TABS (1.2MG ) AT FIRST SIGN OF GOUT FLARE. THEN TAKE 1 TAB 1 HOUR LATER. ON DAY 2 TAKE ONE TAB DAILY UNTIL FLARE STOPS. (Patient taking differently: Take 0.6-1.2 mg by mouth daily as needed (gout flares.).) 90 tablet 1   Continuous Blood Gluc Receiver (FREESTYLE LIBRE 14 DAY READER) DEVI Use as directed 1  each 0   Continuous Blood Gluc Receiver (FREESTYLE LIBRE READER) DEVI Use with Freestyle Sensor to check blood sugar levels. 1 each 0   gabapentin  (NEURONTIN ) 100 MG capsule TAKE 1 CAPSULE (100 MG TOTAL) BY MOUTH AT BEDTIME AS NEEDED (PAIN). 90 capsule 1   glucose blood (ONE TOUCH ULTRA TEST) test strip 1 each by Other route daily. Use as instructed 100 each 12   Lancets (ONETOUCH ULTRASOFT) lancets 1 each by Other route daily. Use as instructed 100 each 12   nitroGLYCERIN  (NITROSTAT ) 0.4 MG SL tablet Place 1 tablet (0.4 mg total) under the tongue every 5 (five) minutes as needed for chest pain. 15 tablet 5   oxyCODONE -acetaminophen  (PERCOCET) 5-325 MG tablet Take 1 tablet by mouth every 4 (four) hours as needed for severe pain (pain score 7-10). 20 tablet 0   traMADol  (ULTRAM ) 50 MG tablet TAKE 2 TABLETS BY MOUTH EVERY 12 HOURS AS NEEDED (Patient taking differently: Take 100 mg by mouth 2 (two) times daily. TAKE 2 TABLETS BY MOUTH EVERY 12 HOURS AS NEEDED) 120 tablet 1   No current facility-administered medications on file prior to visit.    No Known Allergies     Physical Exam Vitals requested from patient and listed below if patient had equipment and was able to obtain at home for this  virtual visit: There were no vitals filed for this visit. Estimated body mass index is 36.96 kg/m as calculated from the following:   Height as of 07/17/24: 5' 11 (1.803 m).   Weight as of 07/17/24: 265 lb (120.2 kg).  EKG (optional): deferred due to virtual visit  GENERAL: alert, oriented, no acute distress detected; full vision exam deferred due to pandemic and/or virtual encounter  PSYCH/NEURO: pleasant and cooperative, no obvious depression or anxiety, speech and thought processing grossly intact, Cognitive function grossly intact  Flowsheet Row Office Visit from [facility]/15/20[facility] [place]  PHQ-9 Total Score 1        08/14/2024    1:15 PM 05/15/2024    3:26 PM 09/06/2023    2:40 PM 07/19/2023    1:03 PM 04/05/2023    1:34 PM  Depression screen PHQ 2/9  Decreased Interest 0 0 0 0 0  Down, Depressed, Hopeless 0 0 0 0 0  PHQ - 2 Score 0 0 0 0 0  Altered sleeping  0 0    Tired, decreased energy  1 0    Change in appetite  0 0    Feeling bad or failure about yourself   0 0    Trouble concentrating  0 0    Moving slowly or fidgety/restless  0 0    Suicidal thoughts  0 0    PHQ-9 Score  1 0    Difficult doing work/chores  Not difficult at all Not difficult at all         08/24/2022    1:35 PM 04/05/2023    1:34 PM 07/19/2023    1:02 PM 05/15/2024    3:26 PM 08/14/2024    1:15 PM  Fall Risk  Falls in the past year? 0 0 1 0 0  Was there an injury with Fall? 0 0 0 0 0  Fall Risk Category Calculator 0 0 2 0 0  Fall Risk Category (Retired) Low       (RETIRED) Patient Fall Risk Level Low fall risk       Patient at Risk for Falls Due to No Fall  Risks No Fall Risks History of fall(s) No Fall Risks   Fall risk Follow up Falls evaluation completed  Falls evaluation completed Education provided;Falls evaluation completed Falls evaluation completed Falls evaluation completed     Data saved with a previous flowsheet row definition     SUMMARY AND  PLAN:  Encounter for Medicare annual wellness exam   Discussed applicable health maintenance/preventive health measures and advised and referred or ordered per patient preferences: -discussed vaccines due recs/risks; advised if/when he gets to let us  know so that we can update his record -discussed eye exam, reports doing this month and he agrees to request that his doc send the eye report -he plans to do in office visit with Dr. Mercer soon for labs and check up, he agrees to schedule Health Maintenance  Topic Date Due   Zoster Vaccines- Shingrix (1 of 2) Never done   OPHTHALMOLOGY EXAM  05/12/2021   DTaP/Tdap/Td (2 - Td or Tdap) 11/12/2021   HEMOGLOBIN A1C  03/05/2024   Influenza Vaccine  06/06/2024   COVID-19 Vaccine (5 - 2025-26 season) 07/07/2024   FOOT EXAM  09/11/2024 (Originally 04/08/2021)   Diabetic kidney evaluation - Urine ACR  12/12/2024   Diabetic kidney evaluation - eGFR measurement  05/31/2025   Medicare Annual Wellness (AWV)  08/14/2025   Colonoscopy  10/13/2027   Pneumococcal Vaccine: 50+ Years  Completed   Hepatitis C Screening  Completed   Meningococcal B Vaccine  Aged Out     Education and counseling on the following was provided based on the above review of health and a plan/checklist for the patient, along with additional information discussed, was provided for the patient in the patient instructions :    -Advised and counseled on a healthy lifestyle - including the importance of a healthy diet, regular physical activity, social connections and stress management. -Reviewed patient's current diet. Advised and counseled on a whole foods based healthy diet. A summary of a healthy diet was provided in the Patient Instructions.  -reviewed patient's current physical activity level and discussed exercise guidelines for adults. Discussed resources and  safe exercise to assist in meeting exercise guideline recommendations in a safe and healthy way. Further resources  provided in patient instructions.  -Advise yearly dental visits at minimum and regular eye exams -Advised and counseled on alcohol safe limits, risks- congratulated on cutting back and discussed plans if relapse.  Follow up: see patient instructions   Patient Instructions  I really enjoyed getting to talk with you today! I am available on Tuesdays and Thursdays for virtual visits if you have any questions or concerns, or if I can be of any further assistance.   CHECKLIST FROM ANNUAL WELLNESS VISIT:  -Follow up (please call to schedule if not scheduled after visit):   -yearly for annual wellness visit with primary care office  Here is a list of your preventive care/health maintenance measures and the plan for each if any are due:  PLAN For any measures below that may be due:    1. Can get the flu shot at the office or at the pharmacy. Can get the other vaccines at the pharmacy - please let us  know if you do so that we can update your records accordingly.    2. Please ask you eye doctor to send the eye report to Dr. Mercer.   3. Please schedule visit for diabetes follow up with Dr. Mercer. Can do labs and foot exam then.   Health Maintenance  Topic Date  Due   Zoster Vaccines- Shingrix (1 of 2) Never done   OPHTHALMOLOGY EXAM  05/12/2021   DTaP/Tdap/Td (2 - Td or Tdap) 11/12/2021   HEMOGLOBIN A1C  03/05/2024   Influenza Vaccine  06/06/2024   COVID-19 Vaccine (5 - 2025-26 season) 07/07/2024   Medicare Annual Wellness (AWV)  07/18/2024   FOOT EXAM  09/11/2024 (Originally 04/08/2021)   Diabetic kidney evaluation - Urine ACR  12/12/2024   Diabetic kidney evaluation - eGFR measurement  05/31/2025   Colonoscopy  10/13/2027   Pneumococcal Vaccine: 50+ Years  Completed   Hepatitis C Screening  Completed   Meningococcal B Vaccine  Aged Out    -See a dentist at least yearly  -Get your eyes checked and then per your eye specialist's recommendations  -Other issues addressed today:   -I  have included below further information regarding a healthy whole foods based diet, physical activity guidelines for adults, stress management and opportunities for social connections. I hope you find this information useful.   -----------------------------------------------------------------------------------------------------------------------------------------------------------------------------------------------------------------------------------------------------------    NUTRITION: -eat real food: lots of colorful vegetables (half the plate) and fruits -5-7 servings of vegetables and fruits per day (fresh or steamed is best), exp. 2 servings of vegetables with lunch and dinner and 2 servings of fruit per day. Berries and greens such as kale and collards are great choices.  -consume on a regular basis:  fresh fruits, fresh veggies, fish, nuts, seeds, healthy oils (such as olive oil, avocado oil), whole grains (make sure for bread/pasta/crackers/etc., that the first ingredient on label contains the word whole), legumes. -can eat small amounts of dairy and lean meat (no larger than the palm of your hand), but avoid processed meats such as ham, bacon, lunch meat, etc. -drink water  -try to avoid fast food and pre-packaged foods, processed meat, ultra processed foods/beverages (donuts, candy, etc.) -most experts advise limiting sodium to < 2300mg  per day, should limit further is any chronic conditions such as high blood pressure, heart disease, diabetes, etc. The American Heart Association advised that < 1500mg  is is ideal -try to avoid foods/beverages that contain any ingredients with names you do not recognize  -try to avoid foods/beverages  with added sugar or sweeteners/sweets  -try to avoid sweet drinks (including diet drinks): soda, juice, Gatorade, sweet tea, power drinks, diet drinks -try to avoid white rice, white bread, pasta (unless whole grain)  EXERCISE GUIDELINES FOR ADULTS: -if  you wish to increase your physical activity, do so gradually and with the approval of your doctor -STOP and seek medical care immediately if you have any chest pain, chest discomfort or trouble breathing when starting or increasing exercise  -move and stretch your body, legs, feet and arms when sitting for long periods -Physical activity guidelines for optimal health in adults: -get at least 150 minutes per week of moderate exercise (can talk, but not sing); this is about 20-30 minutes of sustained activity 5-7 days per week or two 10-15 minute episodes of sustained activity 5-7 days per week -do some muscle building/resistance training/strength training at least 2 days per week  -balance exercises 3+ days per week:   Stand somewhere where you have something sturdy to hold onto if you lose balance    1) lift up on toes, then back down, start with 5x per day and work up to 20x   2) stand and lift one leg straight out to the side so that foot is a few inches of the floor, start with 5x each side and  work up to 20x each side   3) stand on one foot, start with 5 seconds each side and work up to 20 seconds on each side  If you need ideas or help with getting more active:  -Silver sneakers https://tools.silversneakers.com  -Walk with a Doc: http://www.duncan-williams.com/  -try to include resistance (weight lifting/strength building) and balance exercises twice per week: or the following link for ideas: http://castillo-powell.com/  BuyDucts.dk  STRESS MANAGEMENT: -can try meditating, or just sitting quietly with deep breathing while intentionally relaxing all parts of your body for 5 minutes daily -if you need further help with stress, anxiety or depression please follow up with your primary doctor or contact the wonderful folks at [facility]: [phone]  SOCIAL CONNECTIONS: -options in  [place] if you wish to engage in more social and exercise related activities:  -Silver sneakers https://tools.silversneakers.com  -Walk with a Doc: http://www.duncan-williams.com/  -Check out the [facility] Active Adults 50+ section on the [place] of [employer] (hiking clubs, book clubs, cards and games, chess, exercise classes, aquatic classes and much more) - see the website for details: https://www.[place].gov/departments/parks-recreation/active-adults50  -YouTube has lots of exercise videos for different ages and abilities as well  -[facility] (a variety of indoor and outdoor inperson activities for adults). [phone]. [address]  -Virtual Online Classes (a variety of topics): see seniorplanet.org or call [phone]  -consider volunteering at a school, hospice center, church, senior center or elsewhere            Chiquita JONELLE Cramp, DO

## 2024-08-14 NOTE — Patient Instructions (Signed)
 I really enjoyed getting to talk with you today! I am available on Tuesdays and Thursdays for virtual visits if you have any questions or concerns, or if I can be of any further assistance.   CHECKLIST FROM ANNUAL WELLNESS VISIT:  -Follow up (please call to schedule if not scheduled after visit):   -yearly for annual wellness visit with primary care office  Here is a list of your preventive care/health maintenance measures and the plan for each if any are due:  PLAN For any measures below that may be due:    1. Can get the flu shot at the office or at the pharmacy. Can get the other vaccines at the pharmacy - please let us  know if you do so that we can update your records accordingly.    2. Please ask you eye doctor to send the eye report to Dr. Mercer.   3. Please schedule visit for diabetes follow up with Dr. Mercer. Can do labs and foot exam then.   Health Maintenance  Topic Date Due   Zoster Vaccines- Shingrix (1 of 2) Never done   OPHTHALMOLOGY EXAM  05/12/2021   DTaP/Tdap/Td (2 - Td or Tdap) 11/12/2021   HEMOGLOBIN A1C  03/05/2024   Influenza Vaccine  06/06/2024   COVID-19 Vaccine (5 - 2025-26 season) 07/07/2024   Medicare Annual Wellness (AWV)  07/18/2024   FOOT EXAM  09/11/2024 (Originally 04/08/2021)   Diabetic kidney evaluation - Urine ACR  12/12/2024   Diabetic kidney evaluation - eGFR measurement  05/31/2025   Colonoscopy  10/13/2027   Pneumococcal Vaccine: 50+ Years  Completed   Hepatitis C Screening  Completed   Meningococcal B Vaccine  Aged Out    -See a dentist at least yearly  -Get your eyes checked and then per your eye specialist's recommendations  -Other issues addressed today:   -I have included below further information regarding a healthy whole foods based diet, physical activity guidelines for adults, stress management and opportunities for social connections. I hope you find this information useful.    -----------------------------------------------------------------------------------------------------------------------------------------------------------------------------------------------------------------------------------------------------------    NUTRITION: -eat real food: lots of colorful vegetables (half the plate) and fruits -5-7 servings of vegetables and fruits per day (fresh or steamed is best), exp. 2 servings of vegetables with lunch and dinner and 2 servings of fruit per day. Berries and greens such as kale and collards are great choices.  -consume on a regular basis:  fresh fruits, fresh veggies, fish, nuts, seeds, healthy oils (such as olive oil, avocado oil), whole grains (make sure for bread/pasta/crackers/etc., that the first ingredient on label contains the word whole), legumes. -can eat small amounts of dairy and lean meat (no larger than the palm of your hand), but avoid processed meats such as ham, bacon, lunch meat, etc. -drink water  -try to avoid fast food and pre-packaged foods, processed meat, ultra processed foods/beverages (donuts, candy, etc.) -most experts advise limiting sodium to < 2300mg  per day, should limit further is any chronic conditions such as high blood pressure, heart disease, diabetes, etc. The American Heart Association advised that < 1500mg  is is ideal -try to avoid foods/beverages that contain any ingredients with names you do not recognize  -try to avoid foods/beverages  with added sugar or sweeteners/sweets  -try to avoid sweet drinks (including diet drinks): soda, juice, Gatorade, sweet tea, power drinks, diet drinks -try to avoid white rice, white bread, pasta (unless whole grain)  EXERCISE GUIDELINES FOR ADULTS: -if you wish to increase your physical activity, do  so gradually and with the approval of your doctor -STOP and seek medical care immediately if you have any chest pain, chest discomfort or trouble breathing when starting or  increasing exercise  -move and stretch your body, legs, feet and arms when sitting for long periods -Physical activity guidelines for optimal health in adults: -get at least 150 minutes per week of moderate exercise (can talk, but not sing); this is about 20-30 minutes of sustained activity 5-7 days per week or two 10-15 minute episodes of sustained activity 5-7 days per week -do some muscle building/resistance training/strength training at least 2 days per week  -balance exercises 3+ days per week:   Stand somewhere where you have something sturdy to hold onto if you lose balance    1) lift up on toes, then back down, start with 5x per day and work up to 20x   2) stand and lift one leg straight out to the side so that foot is a few inches of the floor, start with 5x each side and work up to 20x each side   3) stand on one foot, start with 5 seconds each side and work up to 20 seconds on each side  If you need ideas or help with getting more active:  -Silver sneakers https://tools.silversneakers.com  -Walk with a Doc: http://www.duncan-williams.com/  -try to include resistance (weight lifting/strength building) and balance exercises twice per week: or the following link for ideas: http://castillo-powell.com/  BuyDucts.dk  STRESS MANAGEMENT: -can try meditating, or just sitting quietly with deep breathing while intentionally relaxing all parts of your body for 5 minutes daily -if you need further help with stress, anxiety or depression please follow up with your primary doctor or contact the wonderful folks at [facility]: [phone]  SOCIAL CONNECTIONS: -options in [place] if you wish to engage in more social and exercise related activities:  -Silver sneakers https://tools.silversneakers.com  -Walk with a Doc: http://www.duncan-williams.com/  -Check out the [facility] Active Adults 50+  section on the [place] of [employer] (hiking clubs, book clubs, cards and games, chess, exercise classes, aquatic classes and much more) - see the website for details: https://www.[place].gov/departments/parks-recreation/active-adults50  -YouTube has lots of exercise videos for different ages and abilities as well  -[facility] (a variety of indoor and outdoor inperson activities for adults). [phone]. [address]  -Virtual Online Classes (a variety of topics): see seniorplanet.org or call [phone]  -consider volunteering at a school, hospice center, church, senior center or elsewhere

## 2024-09-09 DIAGNOSIS — H524 Presbyopia: Secondary | ICD-10-CM | POA: Diagnosis not present

## 2024-09-09 LAB — OPHTHALMOLOGY REPORT-SCANNED

## 2024-09-18 DIAGNOSIS — E1122 Type 2 diabetes mellitus with diabetic chronic kidney disease: Secondary | ICD-10-CM | POA: Diagnosis not present

## 2024-09-18 DIAGNOSIS — I129 Hypertensive chronic kidney disease with stage 1 through stage 4 chronic kidney disease, or unspecified chronic kidney disease: Secondary | ICD-10-CM | POA: Diagnosis not present

## 2024-09-18 DIAGNOSIS — D472 Monoclonal gammopathy: Secondary | ICD-10-CM | POA: Diagnosis not present

## 2024-09-18 DIAGNOSIS — N189 Chronic kidney disease, unspecified: Secondary | ICD-10-CM | POA: Diagnosis not present

## 2024-09-18 DIAGNOSIS — K4090 Unilateral inguinal hernia, without obstruction or gangrene, not specified as recurrent: Secondary | ICD-10-CM | POA: Diagnosis not present

## 2024-09-18 DIAGNOSIS — E875 Hyperkalemia: Secondary | ICD-10-CM | POA: Diagnosis not present

## 2024-09-18 DIAGNOSIS — D631 Anemia in chronic kidney disease: Secondary | ICD-10-CM | POA: Diagnosis not present

## 2024-09-18 DIAGNOSIS — N1832 Chronic kidney disease, stage 3b: Secondary | ICD-10-CM | POA: Diagnosis not present

## 2024-09-18 DIAGNOSIS — N2581 Secondary hyperparathyroidism of renal origin: Secondary | ICD-10-CM | POA: Diagnosis not present

## 2024-09-19 LAB — LAB REPORT - SCANNED
Albumin, Urine POC: 99.5
Creatinine, POC: 43.6 mg/dL
EGFR: 49
Microalb Creat Ratio: 228

## 2024-10-08 ENCOUNTER — Ambulatory Visit (HOSPITAL_COMMUNITY)
Admission: RE | Admit: 2024-10-08 | Discharge: 2024-10-08 | Disposition: A | Payer: Medicare (Managed Care) | Source: Ambulatory Visit | Attending: Cardiovascular Disease | Admitting: Cardiovascular Disease

## 2024-10-08 DIAGNOSIS — I350 Nonrheumatic aortic (valve) stenosis: Secondary | ICD-10-CM | POA: Insufficient documentation

## 2024-10-08 DIAGNOSIS — E785 Hyperlipidemia, unspecified: Secondary | ICD-10-CM | POA: Diagnosis not present

## 2024-10-08 DIAGNOSIS — I2510 Atherosclerotic heart disease of native coronary artery without angina pectoris: Secondary | ICD-10-CM | POA: Diagnosis not present

## 2024-10-08 DIAGNOSIS — I10 Essential (primary) hypertension: Secondary | ICD-10-CM | POA: Diagnosis not present

## 2024-10-08 DIAGNOSIS — I25810 Atherosclerosis of coronary artery bypass graft(s) without angina pectoris: Secondary | ICD-10-CM | POA: Insufficient documentation

## 2024-10-08 LAB — ECHOCARDIOGRAM COMPLETE
AR max vel: 1.3 cm2
AV Area VTI: 1.32 cm2
AV Area mean vel: 1.3 cm2
AV Mean grad: 12 mmHg
AV Peak grad: 22.9 mmHg
Ao pk vel: 2.4 m/s
MV M vel: 2.52 m/s
MV Peak grad: 25.4 mmHg
S' Lateral: 4.71 cm

## 2024-10-09 ENCOUNTER — Ambulatory Visit: Payer: Self-pay | Admitting: Cardiovascular Disease

## 2024-10-21 ENCOUNTER — Encounter: Payer: Self-pay | Admitting: Cardiovascular Disease

## 2024-10-21 ENCOUNTER — Ambulatory Visit: Payer: Medicare (Managed Care) | Attending: Cardiovascular Disease | Admitting: Cardiovascular Disease

## 2024-10-21 VITALS — BP 122/72 | HR 91 | Ht 70.5 in | Wt 268.0 lb

## 2024-10-21 DIAGNOSIS — I2510 Atherosclerotic heart disease of native coronary artery without angina pectoris: Secondary | ICD-10-CM | POA: Diagnosis not present

## 2024-10-21 DIAGNOSIS — I25810 Atherosclerosis of coronary artery bypass graft(s) without angina pectoris: Secondary | ICD-10-CM

## 2024-10-21 DIAGNOSIS — I10 Essential (primary) hypertension: Secondary | ICD-10-CM | POA: Diagnosis not present

## 2024-10-21 DIAGNOSIS — I350 Nonrheumatic aortic (valve) stenosis: Secondary | ICD-10-CM | POA: Diagnosis not present

## 2024-10-21 DIAGNOSIS — E785 Hyperlipidemia, unspecified: Secondary | ICD-10-CM | POA: Diagnosis not present

## 2024-10-21 NOTE — Progress Notes (Signed)
 10/21/2024 [name]   [birth year]  [id number]  Primary Physician Mercer Clotilda SAUNDERS, MD Primary Cardiologist: Dorn JINNY Lesches MD GENI CODY MADEIRA, FSCAI  HPI:  [name] is a 78 y.o.  moderately overweight married male father of 2, grandfather 2 grandchildren referred by Dr. Alvah for cardiovascular evaluation because of an episode of chest pain and an abnormal Myoview  stress test. I last saw him in the office 10/23/2023. He has a history of treated hypertension, diabetes and hyperlipidemia. He has known CAD status post cardiac catheterization performed by Dr. Wonda 01/28/08 revealing three-vessel disease with preserved LV function. He also underwent coronary artery bypass grafting by Dr. Army were/6/09 the LIMA to the LAD, vein graft sequentially to an intermediate branch and obtuse marginal branch as well as the distal right coronary artery. He's been well since. Episode of chest pain several months ago and a recent Myoview  stress test performed 01/08/17 showed inferolateral scar with moderate peri-infarct ischemia.   Since I saw him in the office a year ago his remained stable.  He was seen by Orren Fabry, PA-C 06/11/2024 for preoperative clearance before hernia repair which was done without complication.  He denies chest pain or shortness of breath.  He did have a 2D echo performed 10/08/2024 that revealed a decline in his EF to 40 to 45% from normal 3 years ago for no apparent reason.  He does have mild aortic stenosis.   Active Medications[1]   Allergies[2]  Social History   Socioeconomic History   Marital status: Married    Spouse name: Not on file   Number of children: 3   Years of education: Not on file   Highest education level: Not on file  Occupational History    Employer: [employer]  Tobacco Use   Smoking status: Former    Current packs/day: 0.00    Average packs/day: 1 pack/day for 10.0 years (10.0 ttl pk-yrs)    Types: Cigarettes    Start  date: 07/26/1989    Quit date: 07/27/1999    Years since quitting: 25.2   Smokeless tobacco: Never  Vaping Use   Vaping status: Never Used  Substance and Sexual Activity   Alcohol use: Yes    Comment: 4 beers a day - 28 beer week, patient trying to stop 07/2024   Drug use: No   Sexual activity: Not Currently  Other Topics Concern   Not on file  Social History Narrative   epworth sleepiness scale score: 4   Social Drivers of Health   Tobacco Use: Medium Risk (08/12/2024)   Received [facility]   Patient History    Smoking Tobacco Use: Former    Smokeless Tobacco Use: Never    Passive Exposure: Not on file  Financial Resource Strain: Low Risk (07/14/2022)   Overall Financial Resource Strain (CARDIA)    Difficulty of Paying Living Expenses: Not hard at all  Food Insecurity: No Food Insecurity (07/14/2022)   Hunger Vital Sign    Worried About Running Out of Food in the Last Year: Never true    Ran Out of Food in the Last Year: Never true  Transportation Needs: No Transportation Needs (07/14/2022)   PRAPARE - Administrator, Civil Service (Medical): No    Lack of Transportation (Non-Medical): No  Physical Activity: Insufficiently Active (07/14/2022)   Exercise Vital Sign    Days of Exercise per Week: 3 days    Minutes of Exercise  per Session: 20 min  Stress: No Stress Concern Present (07/14/2022)   [employer] of Occupational Health - Occupational Stress Questionnaire    Feeling of Stress : Not at all  Social Connections: Socially Integrated (07/14/2022)   Social Connection and Isolation Panel    Frequency of Communication with Friends and Family: More than three times a week    Frequency of Social Gatherings with Friends and Family: More than three times a week    Attends Religious Services: More than 4 times per year    Active Member of [employer] or Organizations: Yes    Attends Engineer, Structural: More than 4 times per year    Marital  Status: Married  Catering Manager Violence: Not At Risk (07/14/2022)   Humiliation, Afraid, Rape, and Kick questionnaire    Fear of Current or Ex-Partner: No    Emotionally Abused: No    Physically Abused: No    Sexually Abused: No  Depression (PHQ2-9): Low Risk (08/14/2024)   Depression (PHQ2-9)    PHQ-2 Score: 0  Alcohol Screen: Low Risk (07/14/2022)   Alcohol Screen    Last Alcohol Screening Score (AUDIT): 4  Housing: Unknown (06/04/2024)   Received from [facility]   Epic    Unable to Pay for Housing in the Last Year: Not on file    Number of Times Moved in the Last Year: Not on file    At any time in the past 12 months, were you homeless or living in a shelter (including now)?: No  Utilities: Not on file  Health Literacy: Not on file     Review of Systems: General: negative for chills, fever, night sweats or weight changes.  Cardiovascular: negative for chest pain, dyspnea on exertion, edema, orthopnea, palpitations, paroxysmal nocturnal dyspnea or shortness of breath Dermatological: negative for rash Respiratory: negative for cough or wheezing Urologic: negative for hematuria Abdominal: negative for nausea, vomiting, diarrhea, bright red blood per rectum, melena, or hematemesis Neurologic: negative for visual changes, syncope, or dizziness All other systems reviewed and are otherwise negative except as noted above.    Blood pressure 122/72, pulse 91, height 5' 10.5 (1.791 m), weight 268 lb (121.6 kg), SpO2 95%.  General appearance: alert and no distress Neck: no adenopathy, no carotid bruit, no JVD, supple, symmetrical, trachea midline, and thyroid  not enlarged, symmetric, no tenderness/mass/nodules Lungs: clear to auscultation bilaterally Heart: regular rate and rhythm, S1, S2 normal, no murmur, click, rub or gallop Extremities: extremities normal, atraumatic, no cyanosis or edema Pulses: 2+ and symmetric Skin: Skin color, texture, turgor normal. No  rashes or lesions Neurologic: Grossly normal  EKG EKG Interpretation Date/Time:  Tuesday October 21 2024 14:01:52 EST Ventricular Rate:  91 PR Interval:  184 QRS Duration:  96 QT Interval:  358 QTC Calculation: 440 R Axis:   -1  Text Interpretation: Sinus rhythm with occasional Premature ventricular complexes and Premature atrial complexes Possible Left atrial enlargement Minimal voltage criteria for LVH, may be normal variant ( R in aVL ) Nonspecific T wave abnormality When compared with ECG of 23-Oct-2023 15:00, Premature ventricular complexes are now Present Premature atrial complexes are now Present Confirmed by Court Carrier [phone]) on 10/21/2024 2:10:47 PM    ASSESSMENT AND PLAN:   Dyslipidemia History of dyslipidemia on high-dose atorvastatin  with lipid profile performed 10/24/2021 revealing total cholesterol 115, LDL 44 and HDL of 63.  We will recheck a lipid and liver profile today.  Essential hypertension History of essential hypertension blood pressure  measured today at 122/72.  He is on amlodipine .  CAD, ARTERY BYPASS GRAFT History of CAD status post coronary artery bypass grafting 02/10/08 by Dr. Army with a LIMA to his LAD, vein graft sequentially to an intermediate branch and obtuse marginal branch as well as to the distal right coronary artery.  Myoview  stress test performed 01/08/2017 showed inferolateral scar with mild peri-infarct ischemia.  He is completely asymptomatic and specifically denies chest pain or shortness of breath.  He did have a 2D echocardiogram performed 10/08/2024 that showed an EF of 40 to 45% which represents a decline from his 2D echo in 2022 at which time his EF was normal.  There is no cause identified.  Will recheck an echo in 6 months.  Aortic stenosis Mild aortic stenosis by 2D echo 10/08/2024 with a valve area of 1.32 cm.  He is asymptomatic.  Will recheck an echo in 6 months.     Dorn DOROTHA Lesches MD FACP,FACC,FAHA,  FSCAI 10/21/2024 2:20 PM    [1]  Current Meds  Medication Sig   allopurinol  (ZYLOPRIM ) 100 MG tablet Take 1 tablet (100 mg total) by mouth daily.   amLODipine  (NORVASC ) 10 MG tablet Take 1 tablet (10 mg total) by mouth daily.   aspirin  EC 81 MG tablet Take 81 mg by mouth in the morning. Swallow whole.   atorvastatin  (LIPITOR ) 80 MG tablet TAKE 1 TABLET BY MOUTH EVERY DAY IN THE MORNING   colchicine  0.6 MG tablet TAKE 2 TABS (1.2MG ) AT FIRST SIGN OF GOUT FLARE. THEN TAKE 1 TAB 1 HOUR LATER. ON DAY 2 TAKE ONE TAB DAILY UNTIL FLARE STOPS. (Patient taking differently: Take 0.6-1.2 mg by mouth daily as needed (gout flares.).)   Continuous Blood Gluc Receiver (FREESTYLE LIBRE 14 DAY READER) DEVI Use as directed   Continuous Blood Gluc Receiver (FREESTYLE LIBRE READER) DEVI Use with Freestyle Sensor to check blood sugar levels.   gabapentin  (NEURONTIN ) 100 MG capsule TAKE 1 CAPSULE (100 MG TOTAL) BY MOUTH AT BEDTIME AS NEEDED (PAIN).   glucose blood (ONE TOUCH ULTRA TEST) test strip 1 each by Other route daily. Use as instructed   Lancets (ONETOUCH ULTRASOFT) lancets 1 each by Other route daily. Use as instructed   nitroGLYCERIN  (NITROSTAT ) 0.4 MG SL tablet Place 1 tablet (0.4 mg total) under the tongue every 5 (five) minutes as needed for chest pain.   oxyCODONE -acetaminophen  (PERCOCET) 5-325 MG tablet Take 1 tablet by mouth every 4 (four) hours as needed for severe pain (pain score 7-10).   traMADol  (ULTRAM ) 50 MG tablet TAKE 2 TABLETS BY MOUTH EVERY 12 HOURS AS NEEDED (Patient taking differently: Take 100 mg by mouth 2 (two) times daily. TAKE 2 TABLETS BY MOUTH EVERY 12 HOURS AS NEEDED)  [2] No Known Allergies

## 2024-10-21 NOTE — Assessment & Plan Note (Signed)
 History of dyslipidemia on high-dose atorvastatin  with lipid profile performed 10/24/2021 revealing total cholesterol 115, LDL 44 and HDL of 63.  We will recheck a lipid and liver profile today.

## 2024-10-21 NOTE — Addendum Note (Signed)
 Addended by: LORRENE FEDERICO CROME on: 10/21/2024 02:24 PM   Modules accepted: Orders

## 2024-10-21 NOTE — Patient Instructions (Signed)
 Medication Instructions:  Your physician recommends that you continue on your current medications as directed. Please refer to the Current Medication list given to you today.  *If you need a refill on your cardiac medications before your next appointment, please call your pharmacy*  Testing/Procedures: Your physician has requested that you have an echocardiogram. Echocardiography is a painless test that uses sound waves to create images of your heart. It provides your doctor with information about the size and shape of your heart and how well your hearts chambers and valves are working. This procedure takes approximately one hour. There are no restrictions for this procedure. Please do NOT wear cologne, perfume, aftershave, or lotions (deodorant is allowed). Please arrive 15 minutes prior to your appointment time.  Please note: We ask at that you not bring children with you during ultrasound (echo/ vascular) testing. Due to room size and safety concerns, children are not allowed in the ultrasound rooms during exams. Our front office staff cannot provide observation of children in our lobby area while testing is being conducted. An adult accompanying a patient to their appointment will only be allowed in the ultrasound room at the discretion of the ultrasound technician under special circumstances. We apologize for any inconvenience. **To do in June**   Follow-Up: At [facility], you and your health needs are our priority.  As part of our continuing mission to provide you with exceptional heart care, our providers are all part of one team.  This team includes your primary Cardiologist (physician) and Advanced Practice Providers or APPs (Physician Assistants and Nurse Practitioners) who all work together to provide you with the care you need, when you need it.  Your next appointment:   6 month(s)  Provider:   Jon Hails, PA-C, Lum Louis, NP, Aline Door, PA-C, Kathleen  Johnson, PA-C, Hao Meng, PA-C, Damien Braver, NP, or Katlyn West, NP         Then, Dorn Lesches, MD will plan to see you again in 12 month(s).     We recommend signing up for the patient portal called MyChart.  Sign up information is provided on this After Visit Summary.  MyChart is used to connect with patients for Virtual Visits (Telemedicine).  Patients are able to view lab/test results, encounter notes, upcoming appointments, etc.  Non-urgent messages can be sent to your provider as well.   To learn more about what you can do with MyChart, go to [email or web address].

## 2024-10-21 NOTE — Assessment & Plan Note (Signed)
History of essential hypertension blood pressure measured today at 122/72.  He is on amlodipine.

## 2024-10-21 NOTE — Assessment & Plan Note (Signed)
 History of CAD status post coronary artery bypass grafting 02/10/08 by Dr. Army with a LIMA to his LAD, vein graft sequentially to an intermediate branch and obtuse marginal branch as well as to the distal right coronary artery.  Myoview  stress test performed 01/08/2017 showed inferolateral scar with mild peri-infarct ischemia.  He is completely asymptomatic and specifically denies chest pain or shortness of breath.  He did have a 2D echocardiogram performed 10/08/2024 that showed an EF of 40 to 45% which represents a decline from his 2D echo in 2022 at which time his EF was normal.  There is no cause identified.  Will recheck an echo in 6 months.

## 2024-10-21 NOTE — Assessment & Plan Note (Signed)
 Mild aortic stenosis by 2D echo 10/08/2024 with a valve area of 1.32 cm.  He is asymptomatic.  Will recheck an echo in 6 months.

## 2024-10-22 ENCOUNTER — Ambulatory Visit: Payer: Self-pay | Admitting: Cardiovascular Disease

## 2024-10-22 DIAGNOSIS — E785 Hyperlipidemia, unspecified: Secondary | ICD-10-CM

## 2024-10-22 LAB — LIPID PANEL
Chol/HDL Ratio: 3.1 ratio (ref 0.0–5.0)
Cholesterol, Total: 182 mg/dL (ref 100–199)
HDL: 58 mg/dL (ref >39)
LDL Chol Calc (NIH): 110 mg/dL — ABNORMAL HIGH (ref 0–99)
Triglycerides: 76 mg/dL (ref 0–149)
VLDL Cholesterol Cal: 14 mg/dL (ref 5–40)

## 2024-10-22 LAB — HEPATIC FUNCTION PANEL
ALT: 21 IU/L (ref 0–44)
AST: 19 IU/L (ref 0–40)
Albumin: 3.8 g/dL (ref 3.8–4.8)
Alkaline Phosphatase: 117 IU/L (ref 47–123)
Bilirubin Total: 0.4 mg/dL (ref 0.0–1.2)
Bilirubin, Direct: 0.16 mg/dL (ref 0.00–0.40)
Total Protein: 7.2 g/dL (ref 6.0–8.5)

## 2024-10-28 ENCOUNTER — Other Ambulatory Visit: Payer: Self-pay | Admitting: Family Medicine

## 2024-10-28 DIAGNOSIS — M4726 Other spondylosis with radiculopathy, lumbar region: Secondary | ICD-10-CM

## 2024-11-13 MED ORDER — EZETIMIBE 10 MG PO TABS: 10.0000 mg | ORAL_TABLET | Freq: Every day | ORAL | 3 refills | Status: AC

## 2024-11-21 ENCOUNTER — Telehealth: Payer: Self-pay | Admitting: *Deleted

## 2024-11-21 NOTE — Telephone Encounter (Signed)
 Copied from CRM [id number]. Topic: Clinical - Medication Refill >> Nov 21, 2024 10:16 AM Zebedee SAUNDERS wrote: Medication: traMADol  (ULTRAM ) 50 MG tablet  Has the patient contacted their pharmacy? Yes (Agent: If no, request that the patient contact the pharmacy for the refill. If patient does not wish to contact the pharmacy document the reason why and proceed with request.) (Agent: If yes, when and what did the pharmacy advise?)  This is the patient's preferred pharmacy:  [id number] [place] [address], [place] - [address] [place] [address] Phone: [phone] Fax: [phone]  [facility] [address] ([address]), [place] - [address] [place] ([place]) [place] [address] Phone: [phone] Fax: [phone]  Is this the correct pharmacy for this prescription? Yes If no, delete pharmacy and type the correct one.   Has the prescription been filled recently? Yes  Is the patient out of the medication? Yes  Has the patient been seen for an appointment in the last year OR does the patient have an upcoming appointment? Yes  Can we respond through MyChart? Yes  Agent: Please be advised that Rx refills may take up to 3 business days. We ask that you follow-up with your pharmacy.

## 2024-12-12 ENCOUNTER — Telehealth: Payer: Self-pay

## 2024-12-12 NOTE — Telephone Encounter (Signed)
 Copied from CRM #[id number]. Topic: Clinical - Medication Refill >> Dec 12, 2024  4:12 PM Delon DASEN wrote: Medication: traMADol  (ULTRAM ) 50 MG tablet  Has the patient contacted their pharmacy? Yes (Agent: If no, request that the patient contact the pharmacy for the refill. If patient does not wish to contact the pharmacy document the reason why and proceed with request.) (Agent: If yes, when and what did the pharmacy advise?)  This is the patient's preferred pharmacy:    [facility] [address] ([address] [place] - [address] [place] ([place]) [place] [address] Phone: [phone] Fax: [phone]  Is this the correct pharmacy for this prescription? Yes If no, delete pharmacy and type the correct one.   Has the prescription been filled recently? Yes  Is the patient out of the medication? Yes  Has the patient been seen for an appointment in the last year OR does the patient have an upcoming appointment? Yes  Can we respond through MyChart? Yes  Agent: Please be advised that Rx refills may take up to 3 business days. We ask that you follow-up with your pharmacy.

## 2025-01-26 ENCOUNTER — Other Ambulatory Visit: Payer: Medicare (Managed Care)

## 2025-01-26 ENCOUNTER — Ambulatory Visit: Payer: Medicare (Managed Care) | Admitting: Hematology and Oncology

## 2025-04-07 ENCOUNTER — Ambulatory Visit (HOSPITAL_COMMUNITY): Payer: Medicare (Managed Care)
